# Patient Record
Sex: Female | Born: 1975 | Race: Black or African American | Hispanic: No | Smoking: Current every day smoker
Health system: Southern US, Community
[De-identification: ages and names within clinical notes are randomized; demographics above are authoritative.]

## PROBLEM LIST (undated history)

## (undated) DIAGNOSIS — E7800 Pure hypercholesterolemia, unspecified: Secondary | ICD-10-CM

## (undated) DIAGNOSIS — IMO0002 Reserved for concepts with insufficient information to code with codable children: Secondary | ICD-10-CM

## (undated) DIAGNOSIS — F419 Anxiety disorder, unspecified: Secondary | ICD-10-CM

## (undated) DIAGNOSIS — S335XXA Sprain of ligaments of lumbar spine, initial encounter: Secondary | ICD-10-CM

## (undated) DIAGNOSIS — H0019 Chalazion unspecified eye, unspecified eyelid: Secondary | ICD-10-CM

## (undated) DIAGNOSIS — G8929 Other chronic pain: Secondary | ICD-10-CM

## (undated) DIAGNOSIS — E785 Hyperlipidemia, unspecified: Secondary | ICD-10-CM

## (undated) DIAGNOSIS — N6452 Nipple discharge: Secondary | ICD-10-CM

## (undated) DIAGNOSIS — E079 Disorder of thyroid, unspecified: Secondary | ICD-10-CM

## (undated) DIAGNOSIS — M549 Dorsalgia, unspecified: Secondary | ICD-10-CM

## (undated) DIAGNOSIS — I25810 Atherosclerosis of coronary artery bypass graft(s) without angina pectoris: Secondary | ICD-10-CM

## (undated) DIAGNOSIS — N8501 Benign endometrial hyperplasia: Secondary | ICD-10-CM

## (undated) DIAGNOSIS — L600 Ingrowing nail: Secondary | ICD-10-CM

## (undated) DIAGNOSIS — I10 Essential (primary) hypertension: Secondary | ICD-10-CM

## (undated) DIAGNOSIS — I214 Non-ST elevation (NSTEMI) myocardial infarction: Secondary | ICD-10-CM

## (undated) DIAGNOSIS — Z9114 Patient's other noncompliance with medication regimen: Secondary | ICD-10-CM

## (undated) DIAGNOSIS — E039 Hypothyroidism, unspecified: Secondary | ICD-10-CM

## (undated) HISTORY — DX: Ingrowing nail: L60.0

## (undated) HISTORY — DX: Essential (primary) hypertension: I10

## (undated) HISTORY — PX: OTHER SURGICAL HISTORY: SHX169

## (undated) HISTORY — DX: Sprain of ligaments of lumbar spine, initial encounter: S33.5XXA

## (undated) HISTORY — DX: Pure hypercholesterolemia, unspecified: E78.00

## (undated) HISTORY — DX: Chalazion unspecified eye, unspecified eyelid: H00.19

## (undated) HISTORY — DX: Disorder of thyroid, unspecified: E07.9

## (undated) HISTORY — DX: Reserved for concepts with insufficient information to code with codable children: IMO0002

## (undated) HISTORY — DX: Non-ST elevation (NSTEMI) myocardial infarction: I21.4

## (undated) HISTORY — DX: Benign endometrial hyperplasia: N85.01

## (undated) HISTORY — DX: Nipple discharge: N64.52

## (undated) HISTORY — DX: Atherosclerosis of coronary artery bypass graft(s) without angina pectoris: I25.810

## (undated) HISTORY — DX: Patient's other noncompliance with medication regimen: Z91.14

---

## 1998-06-06 ENCOUNTER — Emergency Department (HOSPITAL_COMMUNITY): Admission: EM | Admit: 1998-06-06 | Discharge: 1998-06-06 | Payer: Self-pay | Admitting: Emergency Medicine

## 1999-03-30 ENCOUNTER — Encounter: Admission: RE | Admit: 1999-03-30 | Discharge: 1999-03-30 | Payer: Self-pay | Admitting: Family Medicine

## 1999-08-18 ENCOUNTER — Encounter: Admission: RE | Admit: 1999-08-18 | Discharge: 1999-08-18 | Payer: Self-pay | Admitting: Family Medicine

## 1999-11-30 ENCOUNTER — Encounter: Admission: RE | Admit: 1999-11-30 | Discharge: 1999-11-30 | Payer: Self-pay | Admitting: Family Medicine

## 1999-12-14 ENCOUNTER — Encounter: Admission: RE | Admit: 1999-12-14 | Discharge: 1999-12-14 | Payer: Self-pay | Admitting: Family Medicine

## 2000-03-23 ENCOUNTER — Encounter: Payer: Self-pay | Admitting: Emergency Medicine

## 2000-03-23 ENCOUNTER — Emergency Department (HOSPITAL_COMMUNITY): Admission: EM | Admit: 2000-03-23 | Discharge: 2000-03-23 | Payer: Self-pay | Admitting: Emergency Medicine

## 2000-09-06 ENCOUNTER — Encounter: Admission: RE | Admit: 2000-09-06 | Discharge: 2000-09-06 | Payer: Self-pay | Admitting: Family Medicine

## 2000-09-21 ENCOUNTER — Encounter: Admission: RE | Admit: 2000-09-21 | Discharge: 2000-09-21 | Payer: Self-pay | Admitting: Family Medicine

## 2000-11-23 ENCOUNTER — Encounter: Admission: RE | Admit: 2000-11-23 | Discharge: 2000-11-23 | Payer: Self-pay | Admitting: Family Medicine

## 2000-12-20 ENCOUNTER — Encounter: Admission: RE | Admit: 2000-12-20 | Discharge: 2000-12-20 | Payer: Self-pay | Admitting: Family Medicine

## 2000-12-28 ENCOUNTER — Encounter: Admission: RE | Admit: 2000-12-28 | Discharge: 2000-12-28 | Payer: Self-pay | Admitting: Family Medicine

## 2001-05-02 ENCOUNTER — Encounter: Admission: RE | Admit: 2001-05-02 | Discharge: 2001-05-02 | Payer: Self-pay | Admitting: Family Medicine

## 2001-05-10 ENCOUNTER — Encounter: Admission: RE | Admit: 2001-05-10 | Discharge: 2001-05-10 | Payer: Self-pay | Admitting: Family Medicine

## 2001-08-18 ENCOUNTER — Emergency Department (HOSPITAL_COMMUNITY): Admission: EM | Admit: 2001-08-18 | Discharge: 2001-08-18 | Payer: Self-pay | Admitting: Emergency Medicine

## 2002-08-22 ENCOUNTER — Encounter: Admission: RE | Admit: 2002-08-22 | Discharge: 2002-08-22 | Payer: Self-pay | Admitting: Family Medicine

## 2002-09-26 ENCOUNTER — Encounter: Admission: RE | Admit: 2002-09-26 | Discharge: 2002-09-26 | Payer: Self-pay | Admitting: Family Medicine

## 2002-09-29 ENCOUNTER — Encounter: Payer: Self-pay | Admitting: Sports Medicine

## 2002-09-29 ENCOUNTER — Encounter: Admission: RE | Admit: 2002-09-29 | Discharge: 2002-09-29 | Payer: Self-pay | Admitting: Sports Medicine

## 2002-10-08 ENCOUNTER — Encounter: Admission: RE | Admit: 2002-10-08 | Discharge: 2002-10-08 | Payer: Self-pay | Admitting: Family Medicine

## 2002-10-10 ENCOUNTER — Encounter: Payer: Self-pay | Admitting: Sports Medicine

## 2002-10-10 ENCOUNTER — Encounter: Admission: RE | Admit: 2002-10-10 | Discharge: 2002-10-10 | Payer: Self-pay | Admitting: Sports Medicine

## 2002-10-13 ENCOUNTER — Encounter: Admission: RE | Admit: 2002-10-13 | Discharge: 2002-10-13 | Payer: Self-pay | Admitting: Family Medicine

## 2002-11-25 ENCOUNTER — Encounter: Admission: RE | Admit: 2002-11-25 | Discharge: 2002-11-25 | Payer: Self-pay | Admitting: Sports Medicine

## 2002-12-15 ENCOUNTER — Encounter: Admission: RE | Admit: 2002-12-15 | Discharge: 2002-12-15 | Payer: Self-pay | Admitting: Family Medicine

## 2002-12-15 ENCOUNTER — Encounter (INDEPENDENT_AMBULATORY_CARE_PROVIDER_SITE_OTHER): Payer: Self-pay | Admitting: Specialist

## 2002-12-15 ENCOUNTER — Other Ambulatory Visit: Admission: RE | Admit: 2002-12-15 | Discharge: 2002-12-15 | Payer: Self-pay | Admitting: *Deleted

## 2003-05-28 ENCOUNTER — Encounter: Admission: RE | Admit: 2003-05-28 | Discharge: 2003-05-28 | Payer: Self-pay | Admitting: Family Medicine

## 2003-12-22 ENCOUNTER — Encounter: Admission: RE | Admit: 2003-12-22 | Discharge: 2003-12-22 | Payer: Self-pay | Admitting: Family Medicine

## 2004-01-18 ENCOUNTER — Encounter (INDEPENDENT_AMBULATORY_CARE_PROVIDER_SITE_OTHER): Payer: Self-pay | Admitting: *Deleted

## 2004-01-18 ENCOUNTER — Encounter: Admission: RE | Admit: 2004-01-18 | Discharge: 2004-01-18 | Payer: Self-pay | Admitting: Family Medicine

## 2004-03-17 ENCOUNTER — Encounter: Admission: RE | Admit: 2004-03-17 | Discharge: 2004-03-17 | Payer: Self-pay | Admitting: Family Medicine

## 2004-04-07 ENCOUNTER — Encounter: Admission: RE | Admit: 2004-04-07 | Discharge: 2004-04-07 | Payer: Self-pay | Admitting: Family Medicine

## 2004-04-12 ENCOUNTER — Encounter: Admission: RE | Admit: 2004-04-12 | Discharge: 2004-04-12 | Payer: Self-pay | Admitting: Family Medicine

## 2004-08-05 ENCOUNTER — Emergency Department (HOSPITAL_COMMUNITY): Admission: EM | Admit: 2004-08-05 | Discharge: 2004-08-05 | Payer: Self-pay | Admitting: Emergency Medicine

## 2004-08-12 ENCOUNTER — Encounter: Admission: RE | Admit: 2004-08-12 | Discharge: 2004-08-12 | Payer: Self-pay | Admitting: Specialist

## 2004-09-30 ENCOUNTER — Ambulatory Visit: Payer: Self-pay | Admitting: Sports Medicine

## 2004-12-01 ENCOUNTER — Ambulatory Visit: Payer: Self-pay | Admitting: Family Medicine

## 2004-12-01 ENCOUNTER — Encounter (INDEPENDENT_AMBULATORY_CARE_PROVIDER_SITE_OTHER): Payer: Self-pay | Admitting: *Deleted

## 2005-01-10 ENCOUNTER — Ambulatory Visit: Payer: Self-pay | Admitting: Family Medicine

## 2005-01-11 ENCOUNTER — Ambulatory Visit: Payer: Self-pay | Admitting: Family Medicine

## 2005-01-20 ENCOUNTER — Ambulatory Visit: Payer: Self-pay | Admitting: Family Medicine

## 2005-02-08 ENCOUNTER — Ambulatory Visit: Payer: Self-pay | Admitting: Family Medicine

## 2005-02-21 ENCOUNTER — Encounter: Admission: RE | Admit: 2005-02-21 | Discharge: 2005-05-22 | Payer: Self-pay | Admitting: Family Medicine

## 2005-05-24 ENCOUNTER — Encounter: Admission: RE | Admit: 2005-05-24 | Discharge: 2005-08-22 | Payer: Self-pay | Admitting: Family Medicine

## 2005-06-09 ENCOUNTER — Encounter (INDEPENDENT_AMBULATORY_CARE_PROVIDER_SITE_OTHER): Payer: Self-pay | Admitting: *Deleted

## 2005-06-09 ENCOUNTER — Ambulatory Visit: Payer: Self-pay | Admitting: Family Medicine

## 2005-06-21 ENCOUNTER — Ambulatory Visit: Payer: Self-pay | Admitting: Family Medicine

## 2005-10-23 ENCOUNTER — Ambulatory Visit: Payer: Self-pay | Admitting: Family Medicine

## 2005-11-13 ENCOUNTER — Encounter (INDEPENDENT_AMBULATORY_CARE_PROVIDER_SITE_OTHER): Payer: Self-pay | Admitting: *Deleted

## 2005-11-13 LAB — CONVERTED CEMR LAB

## 2005-12-06 ENCOUNTER — Ambulatory Visit: Payer: Self-pay | Admitting: Family Medicine

## 2005-12-06 ENCOUNTER — Encounter (INDEPENDENT_AMBULATORY_CARE_PROVIDER_SITE_OTHER): Payer: Self-pay | Admitting: *Deleted

## 2006-05-07 ENCOUNTER — Emergency Department (HOSPITAL_COMMUNITY): Admission: EM | Admit: 2006-05-07 | Discharge: 2006-05-07 | Payer: Self-pay | Admitting: Family Medicine

## 2006-07-27 ENCOUNTER — Emergency Department (HOSPITAL_COMMUNITY): Admission: EM | Admit: 2006-07-27 | Discharge: 2006-07-27 | Payer: Self-pay | Admitting: Emergency Medicine

## 2006-08-16 ENCOUNTER — Ambulatory Visit: Payer: Self-pay | Admitting: Family Medicine

## 2006-08-31 ENCOUNTER — Ambulatory Visit (HOSPITAL_BASED_OUTPATIENT_CLINIC_OR_DEPARTMENT_OTHER): Admission: RE | Admit: 2006-08-31 | Discharge: 2006-08-31 | Payer: Self-pay | Admitting: Gynecology

## 2006-08-31 ENCOUNTER — Encounter (INDEPENDENT_AMBULATORY_CARE_PROVIDER_SITE_OTHER): Payer: Self-pay | Admitting: Specialist

## 2006-09-13 DIAGNOSIS — N8501 Benign endometrial hyperplasia: Secondary | ICD-10-CM

## 2006-09-13 HISTORY — DX: Benign endometrial hyperplasia: N85.01

## 2006-11-13 HISTORY — PX: OTHER SURGICAL HISTORY: SHX169

## 2006-11-13 LAB — CONVERTED CEMR LAB: Pap Smear: ABNORMAL

## 2007-01-10 DIAGNOSIS — E1165 Type 2 diabetes mellitus with hyperglycemia: Secondary | ICD-10-CM

## 2007-01-10 DIAGNOSIS — E282 Polycystic ovarian syndrome: Secondary | ICD-10-CM | POA: Insufficient documentation

## 2007-01-10 DIAGNOSIS — I10 Essential (primary) hypertension: Secondary | ICD-10-CM | POA: Insufficient documentation

## 2007-01-10 DIAGNOSIS — I152 Hypertension secondary to endocrine disorders: Secondary | ICD-10-CM | POA: Insufficient documentation

## 2007-01-10 DIAGNOSIS — E039 Hypothyroidism, unspecified: Secondary | ICD-10-CM

## 2007-01-10 DIAGNOSIS — E785 Hyperlipidemia, unspecified: Secondary | ICD-10-CM | POA: Insufficient documentation

## 2007-01-11 ENCOUNTER — Encounter (INDEPENDENT_AMBULATORY_CARE_PROVIDER_SITE_OTHER): Payer: Self-pay | Admitting: *Deleted

## 2007-05-02 ENCOUNTER — Ambulatory Visit: Payer: Self-pay | Admitting: Family Medicine

## 2007-05-02 ENCOUNTER — Telehealth: Payer: Self-pay | Admitting: *Deleted

## 2007-05-02 DIAGNOSIS — H01009 Unspecified blepharitis unspecified eye, unspecified eyelid: Secondary | ICD-10-CM | POA: Insufficient documentation

## 2007-06-03 ENCOUNTER — Telehealth: Payer: Self-pay | Admitting: Family Medicine

## 2007-08-14 DIAGNOSIS — IMO0002 Reserved for concepts with insufficient information to code with codable children: Secondary | ICD-10-CM

## 2007-08-14 HISTORY — DX: Reserved for concepts with insufficient information to code with codable children: IMO0002

## 2007-08-22 ENCOUNTER — Ambulatory Visit: Payer: Self-pay | Admitting: Internal Medicine

## 2007-08-22 ENCOUNTER — Telehealth: Payer: Self-pay | Admitting: *Deleted

## 2007-08-22 DIAGNOSIS — R109 Unspecified abdominal pain: Secondary | ICD-10-CM | POA: Insufficient documentation

## 2007-09-04 ENCOUNTER — Other Ambulatory Visit: Admission: RE | Admit: 2007-09-04 | Discharge: 2007-09-04 | Payer: Self-pay | Admitting: Gynecology

## 2007-10-01 ENCOUNTER — Ambulatory Visit: Payer: Self-pay | Admitting: Family Medicine

## 2007-10-01 DIAGNOSIS — H0019 Chalazion unspecified eye, unspecified eyelid: Secondary | ICD-10-CM | POA: Insufficient documentation

## 2007-10-01 HISTORY — DX: Chalazion unspecified eye, unspecified eyelid: H00.19

## 2007-10-02 DIAGNOSIS — I10 Essential (primary) hypertension: Secondary | ICD-10-CM | POA: Insufficient documentation

## 2008-03-30 ENCOUNTER — Emergency Department (HOSPITAL_COMMUNITY): Admission: EM | Admit: 2008-03-30 | Discharge: 2008-03-30 | Payer: Self-pay | Admitting: Emergency Medicine

## 2008-04-09 ENCOUNTER — Other Ambulatory Visit: Admission: RE | Admit: 2008-04-09 | Discharge: 2008-04-09 | Payer: Self-pay | Admitting: Gynecology

## 2008-09-04 ENCOUNTER — Ambulatory Visit: Payer: Self-pay | Admitting: Gynecology

## 2008-09-04 ENCOUNTER — Other Ambulatory Visit: Admission: RE | Admit: 2008-09-04 | Discharge: 2008-09-04 | Payer: Self-pay | Admitting: Gynecology

## 2008-09-04 ENCOUNTER — Encounter: Payer: Self-pay | Admitting: Gynecology

## 2008-10-15 ENCOUNTER — Telehealth (INDEPENDENT_AMBULATORY_CARE_PROVIDER_SITE_OTHER): Payer: Self-pay | Admitting: Family Medicine

## 2008-10-19 ENCOUNTER — Ambulatory Visit: Payer: Self-pay | Admitting: Family Medicine

## 2008-10-19 ENCOUNTER — Encounter: Payer: Self-pay | Admitting: Family Medicine

## 2008-10-19 ENCOUNTER — Telehealth: Payer: Self-pay | Admitting: *Deleted

## 2008-10-19 LAB — CONVERTED CEMR LAB
Chlamydia, DNA Probe: NEGATIVE
GC Probe Amp, Genital: NEGATIVE

## 2008-10-20 ENCOUNTER — Encounter (INDEPENDENT_AMBULATORY_CARE_PROVIDER_SITE_OTHER): Payer: Self-pay | Admitting: Family Medicine

## 2008-10-23 ENCOUNTER — Encounter (INDEPENDENT_AMBULATORY_CARE_PROVIDER_SITE_OTHER): Payer: Self-pay | Admitting: Family Medicine

## 2008-11-23 ENCOUNTER — Ambulatory Visit: Payer: Self-pay | Admitting: Family Medicine

## 2009-01-21 ENCOUNTER — Telehealth (INDEPENDENT_AMBULATORY_CARE_PROVIDER_SITE_OTHER): Payer: Self-pay | Admitting: Family Medicine

## 2009-01-22 ENCOUNTER — Telehealth (INDEPENDENT_AMBULATORY_CARE_PROVIDER_SITE_OTHER): Payer: Self-pay | Admitting: *Deleted

## 2009-03-22 ENCOUNTER — Other Ambulatory Visit: Admission: RE | Admit: 2009-03-22 | Discharge: 2009-03-22 | Payer: Self-pay | Admitting: Gynecology

## 2009-03-22 ENCOUNTER — Ambulatory Visit: Payer: Self-pay | Admitting: Gynecology

## 2009-03-22 ENCOUNTER — Encounter: Payer: Self-pay | Admitting: Gynecology

## 2009-03-22 ENCOUNTER — Emergency Department (HOSPITAL_COMMUNITY): Admission: EM | Admit: 2009-03-22 | Discharge: 2009-03-22 | Payer: Self-pay | Admitting: Family Medicine

## 2009-03-25 ENCOUNTER — Telehealth: Payer: Self-pay | Admitting: *Deleted

## 2009-03-26 ENCOUNTER — Ambulatory Visit: Payer: Self-pay | Admitting: Family Medicine

## 2009-03-26 DIAGNOSIS — R109 Unspecified abdominal pain: Secondary | ICD-10-CM

## 2009-03-26 DIAGNOSIS — S335XXA Sprain of ligaments of lumbar spine, initial encounter: Secondary | ICD-10-CM | POA: Insufficient documentation

## 2009-03-26 HISTORY — DX: Unspecified abdominal pain: R10.9

## 2009-03-26 HISTORY — DX: Sprain of ligaments of lumbar spine, initial encounter: S33.5XXA

## 2009-03-26 LAB — CONVERTED CEMR LAB: Beta hcg, urine, semiquantitative: NEGATIVE

## 2009-07-15 ENCOUNTER — Telehealth: Payer: Self-pay | Admitting: Family Medicine

## 2009-07-16 ENCOUNTER — Ambulatory Visit: Payer: Self-pay | Admitting: Family Medicine

## 2009-10-04 ENCOUNTER — Ambulatory Visit: Payer: Self-pay | Admitting: Gynecology

## 2009-10-04 ENCOUNTER — Other Ambulatory Visit: Admission: RE | Admit: 2009-10-04 | Discharge: 2009-10-04 | Payer: Self-pay | Admitting: Gynecology

## 2010-09-18 ENCOUNTER — Emergency Department (HOSPITAL_BASED_OUTPATIENT_CLINIC_OR_DEPARTMENT_OTHER): Admission: EM | Admit: 2010-09-18 | Discharge: 2010-09-18 | Payer: Self-pay | Admitting: Emergency Medicine

## 2010-11-30 ENCOUNTER — Emergency Department (HOSPITAL_COMMUNITY)
Admission: EM | Admit: 2010-11-30 | Discharge: 2010-11-30 | Payer: Self-pay | Source: Home / Self Care | Admitting: Family Medicine

## 2011-01-10 ENCOUNTER — Encounter (INDEPENDENT_AMBULATORY_CARE_PROVIDER_SITE_OTHER): Payer: Managed Care, Other (non HMO) | Admitting: Gynecology

## 2011-01-10 DIAGNOSIS — Z01419 Encounter for gynecological examination (general) (routine) without abnormal findings: Secondary | ICD-10-CM

## 2011-01-10 DIAGNOSIS — R823 Hemoglobinuria: Secondary | ICD-10-CM

## 2011-01-11 ENCOUNTER — Other Ambulatory Visit (HOSPITAL_COMMUNITY)
Admission: RE | Admit: 2011-01-11 | Discharge: 2011-01-11 | Disposition: A | Discharge status: Home / Self Care | Payer: Managed Care, Other (non HMO) | Source: Ambulatory Visit | Attending: Gynecology | Admitting: Gynecology

## 2011-01-11 ENCOUNTER — Other Ambulatory Visit: Payer: Self-pay | Admitting: Gynecology

## 2011-01-11 ENCOUNTER — Encounter: Payer: Self-pay | Admitting: Gynecology

## 2011-01-11 DIAGNOSIS — Z124 Encounter for screening for malignant neoplasm of cervix: Secondary | ICD-10-CM | POA: Insufficient documentation

## 2011-02-02 ENCOUNTER — Ambulatory Visit (INDEPENDENT_AMBULATORY_CARE_PROVIDER_SITE_OTHER): Payer: Managed Care, Other (non HMO) | Admitting: Family Medicine

## 2011-02-02 ENCOUNTER — Encounter: Payer: Self-pay | Admitting: Family Medicine

## 2011-02-02 ENCOUNTER — Ambulatory Visit: Payer: Managed Care, Other (non HMO) | Admitting: Gynecology

## 2011-02-02 ENCOUNTER — Other Ambulatory Visit: Payer: Managed Care, Other (non HMO)

## 2011-02-02 VITALS — BP 135/95 | HR 92 | Temp 98.7°F | Ht 66.75 in | Wt 258.0 lb

## 2011-02-02 DIAGNOSIS — E039 Hypothyroidism, unspecified: Secondary | ICD-10-CM

## 2011-02-02 DIAGNOSIS — J3489 Other specified disorders of nose and nasal sinuses: Secondary | ICD-10-CM

## 2011-02-02 DIAGNOSIS — F17200 Nicotine dependence, unspecified, uncomplicated: Secondary | ICD-10-CM

## 2011-02-02 DIAGNOSIS — E119 Type 2 diabetes mellitus without complications: Secondary | ICD-10-CM

## 2011-02-02 MED ORDER — FLUTICASONE FUROATE 27.5 MCG/SPRAY NA SUSP: NASAL | Status: DC

## 2011-02-02 NOTE — Patient Instructions (Addendum)
I think you may have allergies and may also have an overlying viral upper respiratory infection. For allergies:   -Try OTC Zyrtec, Allergra  -May also try nasal rinses or Nettipot.   -May also try steroid nasal spray.   For viral URI symptoms:  -Tylenol/ibuprofen as needed.   -Lots of fluids.  Congraulations on your engagement. I would encourage you also to stop smoking.

## 2011-02-02 NOTE — Assessment & Plan Note (Signed)
Managed by endocrinologist Dr. Talmage Nap. On Synthroid.

## 2011-02-02 NOTE — Assessment & Plan Note (Signed)
Trying to quit but still smoking about 1/2 ppd.  People around her who smoke make it difficult for her to quit. Counseled. She wants to quit. Says she and fiancee are trying quit together.

## 2011-02-02 NOTE — Assessment & Plan Note (Signed)
Managed by Dr. Talmage Nap endocrinologist. Stopped taking Januvia due to cost and now on Humalog 75/25.

## 2011-02-02 NOTE — Assessment & Plan Note (Signed)
May be due to (seasonal) allergies or mild viral URI.  Recommend OTC anti-histamines. Given Rx for nasal steroids to use if orals do not help symptoms after 1-2 weeks.  Recommended conservative management for possible viral URI. Tylenol/ibuprofen prn.   Asked to follow-up prn or within a month with PCP. Never met PCP. Just got insurance and so is trying to get back to having regular appointments again.

## 2011-02-02 NOTE — Progress Notes (Signed)
  Subjective:    Patient ID: Tanya Nolan, female    DOB: September 22, 1976, 35 y.o.   MRN: [id number]  HPI 1. Cold symptoms since 03/18. Congestion with mild headaches, body aches, chills, cough. Had bronchitis around same time last year.  Had to leave work early today because was feeling weak. No fevers, productive cough. Mucinex helps but Theraflu didn't.  Never had seasonal allergies or diagnosis of asthma.   Smoking 1/2 ppd.  Review of Systems Denies vomiting. Occasional nausea. Denies diarrhea. Tolerating diet.     Objective:   Physical Exam Gen: alert, awake, not uncomfortable appearing, does not sound congested or appear weak HEENT: TM may be slightly bulging bilaterally but good light reflex and non-erythematous; mild clear nasal discharge; no conjunctival injection or drainage; no tonsillar or cervical adenopathy; MMM; no sinus tenderness Lungs: CTAB, no ronchi/wheezes/rales       Assessment & Plan:

## 2011-02-06 ENCOUNTER — Other Ambulatory Visit: Payer: Managed Care, Other (non HMO)

## 2011-02-06 ENCOUNTER — Ambulatory Visit: Payer: Managed Care, Other (non HMO) | Admitting: Gynecology

## 2011-02-13 ENCOUNTER — Other Ambulatory Visit: Payer: Managed Care, Other (non HMO)

## 2011-02-13 ENCOUNTER — Ambulatory Visit (INDEPENDENT_AMBULATORY_CARE_PROVIDER_SITE_OTHER): Payer: Managed Care, Other (non HMO) | Admitting: Gynecology

## 2011-02-13 ENCOUNTER — Other Ambulatory Visit: Payer: Self-pay | Admitting: Gynecology

## 2011-02-13 DIAGNOSIS — N920 Excessive and frequent menstruation with regular cycle: Secondary | ICD-10-CM

## 2011-02-13 DIAGNOSIS — E282 Polycystic ovarian syndrome: Secondary | ICD-10-CM

## 2011-02-13 DIAGNOSIS — N7013 Chronic salpingitis and oophoritis: Secondary | ICD-10-CM

## 2011-02-13 DIAGNOSIS — N840 Polyp of corpus uteri: Secondary | ICD-10-CM

## 2011-02-13 DIAGNOSIS — D3910 Neoplasm of uncertain behavior of unspecified ovary: Secondary | ICD-10-CM

## 2011-03-31 NOTE — Op Note (Signed)
NAMELASHANE, WHELPLEY                  ACCOUNT NO.:  [id number]   MEDICAL RECORD NO.:  [id number]          PATIENT TYPE:  AMB   LOCATION:  [facility]                         FACILITY:  [facility]   PHYSICIAN:  Timothy P. Fontaine, M.D.DATE OF BIRTH:  Mar 20, 1976   DATE OF PROCEDURE:  08/31/2006  DATE OF DISCHARGE:                                 OPERATIVE REPORT   PREOPERATIVE DIAGNOSES:  Irregular menses, endometrial polyp.   POSTOPERATIVE DIAGNOSES:  Irregular menses, endometrial polyp.   PROCEDURE:  Hysteroscopic polypectomy, D&C.   SURGEON:  Timothy P. Fontaine, M.D.   ANESTHETIC:  General.   ESTIMATED BLOOD LOSS:  Minimal.   SORBITOL DISCREPANCY:  20-30 mL.   COMPLICATIONS:  None.   SPECIMEN:  1. Endometrial polyps.  2. Endometrial curetting.   FINDINGS:  EUA:  external B U S, vagina normal.  Cervix normal, uterus  normal size, midline and mobile.  Adnexa without masses.  Hysteroscopic: Two  areas finger-like polyps, fundal right and left endometrial cavity resected  at the base. Hysteroscopy otherwise adequate normal noting fundus, right and  left tubal ostia, anterior-posterior uterine surfaces, lower uterine  segment, endocervical canal all visualized.   PROCEDURE:  The patient was taken to the operating room having received  antibiotic prophylaxsis, underwent general anesthesia, was placed low dorsal  lithotomy position, received a perineal vaginal preparation with Betadine  solution.  Bladder emptied with in-and-out Foley catheterization.  EUA  performed.  The patient draped in usual fashion.  The cervix was visualized  with a speculum, anterior lip grasped with single-tooth tenaculum and the  cervix was gently gradually dilated to admit the operative hysteroscope.  Hysteroscopy was performed with findings noted above.  Using the right angle  resectoscopic loop, the right and left fundal polyps were resected at their  base at the level of the surrounding endometrium.  There  were removed  separately sent to pathology.  Subsequently a sharp curettage was performed  and this was sent as a separate specimen to pathology.  Rehysteroscopy  showed an empty cavity, good distension, no evidence of perforation.  The  tenaculum was removed.  There was oozing due to a slight  tear in the cervical mucosa from the tenaculum and 3-0 chromic in a short  interrupted stitch was placed to achieve hemostasis.  The instruments were  all removed.  The patient placed in supine position, awakened without  difficulty and taken to recovery room in good condition having tolerated the  procedure well.      Timothy P. Fontaine, M.D.  Electronically Signed     TPF/MEDQ  D:  08/31/2006  T:  09/01/2006  Job:  [id number]

## 2011-03-31 NOTE — H&P (Signed)
NAMEANJANNETTE, Tanya Nolan                  ACCOUNT NO.:  [id number]   MEDICAL RECORD NO.:  [id number]          PATIENT TYPE:  AMB   LOCATION:  [facility]                         [facility]AC[facility]   PHYSICIAN:  Timothy P. Fontaine, M.D.DATE OF BIRTH:  Mar 28, 1976   DATE OF ADMISSION:  08/31/2006  DATE OF DISCHARGE:                                HISTORY & PHYSICAL   CHIEF COMPLAINT:  Menorrhagia, irregular bleeding.   HISTORY OF PRESENT ILLNESS:  [age]-year-old G0, initially presented  complaining of irregular menses. The patient's evaluation included normal  FSH prolactin and hemoglobin of 14.6.  She had a TSH which was elevated in  the 7 range. The patient also had a sonohysterogram which showed an  endometrial defect consistent with a polyp and a cystic area presumed to be  from her left ovary. She subsequently has been evaluated by Dr. Talmage Nap, has  begun on Synthroid replacement and her most recent TSH is 2.49. She is  admitted at this time for hysteroscopic evaluation and resection of the  endometrial defect. She has follow up planned as an outpatient in a month  for ultrasound to follow up on her ovarian cyst.   PAST MEDICAL HISTORY:  1. Significant for diabetes.  2. High blood pressure.  3. Hypercholesterolemia.  4. Hypothyroidism.   CURRENT MEDICATIONS:  1. Glucophage 500 b.i.d.  2. Zocor 20 q.h.s.  3. Enalapril daily.  4. Baby aspirin daily.  5. Synthroid daily, dose unknown at this time.   ALLERGIES:  No medication allergies.   REVIEW OF SYSTEMS:  Noncontributory.   FAMILY HISTORY:  Noncontributory.   SOCIAL HISTORY:  Noncontributory.   PHYSICAL EXAMINATION:  VITAL SIGNS:  Afebrile, vital signs stable. Blood  pressure 122/82.  HEENT:  Normal.  LUNGS:  Clear.  CARDIAC:  Regular rate, no rubs, murmurs or gallops.  ABDOMEN:  Exam benign, obese.  PELVIC:  External BUS, vagina normal. Cervix normal. Uterus grossly normal  in size, limited by abdominal girth. Adnexa  without gross masses or  tenderness.   ASSESSMENT:  [age]-year-old GO with irregular bleeding. Evaluation  reveals an endometrial polyp. Multiple medical issues to include type 2  diabetes, PCOS, hyperlipidemia, hypertension and hypothyroidism. The patient  is being actively followed by endocrinology and internal medicine. She is  controlled on her Synthroid with recent TSH of 2.49. She saw Dr. Karn Pickler who  gave her medical surgical clearance for her surgery and she is admitted at  this time for hysteroscopy, D&C, resection of her endometrial polyp. The  expected intraoperative/postoperative courses were reviewed with her as well  as the acute intraoperative/postoperative risks. I reviewed hysteroscopy,  D&C, use of the resectoscope with her and the risks of bleeding,  transfusion, infection, uterine perforation leading to damage to internal  organs including bowel, bladder, ureters, vessels, and nerves and assessed  any major exploratory repair of surgeries in the future. Imperative  surgeries including ostomy formation, was all discussed, understood and  accepted. The patient's questions were answered to her satisfaction. She is  ready to proceed with surgery.      Timothy P. Fontaine,  M.D.  Electronically Signed     TPF/MEDQ  D:  08/29/2006  T:  08/29/2006  Job:  [id number]

## 2011-09-12 DIAGNOSIS — IMO0002 Reserved for concepts with insufficient information to code with codable children: Secondary | ICD-10-CM | POA: Insufficient documentation

## 2011-09-12 DIAGNOSIS — N8501 Benign endometrial hyperplasia: Secondary | ICD-10-CM | POA: Insufficient documentation

## 2011-09-13 ENCOUNTER — Encounter: Payer: Self-pay | Admitting: Gynecology

## 2011-09-13 ENCOUNTER — Ambulatory Visit (INDEPENDENT_AMBULATORY_CARE_PROVIDER_SITE_OTHER): Payer: Managed Care, Other (non HMO) | Admitting: Gynecology

## 2011-09-13 DIAGNOSIS — R102 Pelvic and perineal pain: Secondary | ICD-10-CM

## 2011-09-13 DIAGNOSIS — N7013 Chronic salpingitis and oophoritis: Secondary | ICD-10-CM

## 2011-09-13 DIAGNOSIS — N390 Urinary tract infection, site not specified: Secondary | ICD-10-CM

## 2011-09-13 DIAGNOSIS — B373 Candidiasis of vulva and vagina: Secondary | ICD-10-CM

## 2011-09-13 DIAGNOSIS — N7011 Chronic salpingitis: Secondary | ICD-10-CM

## 2011-09-13 DIAGNOSIS — N898 Other specified noninflammatory disorders of vagina: Secondary | ICD-10-CM

## 2011-09-13 DIAGNOSIS — R82998 Other abnormal findings in urine: Secondary | ICD-10-CM

## 2011-09-13 DIAGNOSIS — N949 Unspecified condition associated with female genital organs and menstrual cycle: Secondary | ICD-10-CM

## 2011-09-13 MED ORDER — FLUCONAZOLE 150 MG PO TABS: 150.0000 mg | ORAL_TABLET | Freq: Once | ORAL | Status: AC

## 2011-09-13 MED ORDER — CIPROFLOXACIN HCL 250 MG PO TABS: 250.0000 mg | ORAL_TABLET | Freq: Two times a day (BID) | ORAL | Status: AC

## 2011-09-13 NOTE — Progress Notes (Addendum)
Patient presents with a one-week history of pain with intercourse noting pelvic aching and discomfort. No frequency dysuria or urgency symptoms constipation diarrhea fevers or chills. Having regular monthly menses. Having a little bit of discharge with some itching. She does have a history of probable hydrosalpinx as well as a probable small endometrial polyp from sonohysterogram April 2012. She was to follow up in several cycles for recheck and hasn't done that yet.  Exam Abdomen soft nontender without masses guarding rebound organomegaly Pelvic external BUS vagina normal slight discharge noted wet prep done cervix normal bimanual mild pelvic tenderness uterus grossly normal size midline mobile adnexa without gross masses or tenderness  Assessment and plan: 1. White discharge KOH wet prep is positive for yeast we'll treat with Diflucan 150x1 dose follow up if symptoms persist or recur. 2. Pelvic pain with dyspareunia. Will check ultrasound and follow up of her hydrosalpinx. Her UA is suspicious for UTI and recover her with Cipro 250 twice a day x3 days. 3. History of endometrial polyp. Patient will follow up for her ultrasound per above we'll do sonohysterogram relook at this area. She continues to have heavy menses as previously discussed earlier this year. 4. Contraception. I again discussed with her she is not using contraception she is at risk for pregnancy she understands this accepts.

## 2011-10-04 ENCOUNTER — Encounter: Payer: Self-pay | Admitting: Gynecology

## 2011-10-04 ENCOUNTER — Ambulatory Visit (INDEPENDENT_AMBULATORY_CARE_PROVIDER_SITE_OTHER): Payer: Managed Care, Other (non HMO)

## 2011-10-04 ENCOUNTER — Other Ambulatory Visit: Payer: Self-pay | Admitting: Gynecology

## 2011-10-04 ENCOUNTER — Ambulatory Visit (INDEPENDENT_AMBULATORY_CARE_PROVIDER_SITE_OTHER): Payer: Managed Care, Other (non HMO) | Admitting: Gynecology

## 2011-10-04 DIAGNOSIS — N840 Polyp of corpus uteri: Secondary | ICD-10-CM

## 2011-10-04 DIAGNOSIS — N7013 Chronic salpingitis and oophoritis: Secondary | ICD-10-CM

## 2011-10-04 DIAGNOSIS — N946 Dysmenorrhea, unspecified: Secondary | ICD-10-CM

## 2011-10-04 DIAGNOSIS — N920 Excessive and frequent menstruation with regular cycle: Secondary | ICD-10-CM

## 2011-10-04 DIAGNOSIS — N7011 Chronic salpingitis: Secondary | ICD-10-CM

## 2011-10-04 DIAGNOSIS — N831 Corpus luteum cyst of ovary, unspecified side: Secondary | ICD-10-CM

## 2011-10-04 DIAGNOSIS — N949 Unspecified condition associated with female genital organs and menstrual cycle: Secondary | ICD-10-CM

## 2011-10-04 DIAGNOSIS — R102 Pelvic and perineal pain: Secondary | ICD-10-CM

## 2011-10-04 DIAGNOSIS — IMO0002 Reserved for concepts with insufficient information to code with codable children: Secondary | ICD-10-CM

## 2011-10-04 NOTE — Progress Notes (Signed)
Patient presents for sonohysterogram due to history of pelvic pain, probable hydrosalpinx, small endometrial polyp on prior sonohysterogram in March 2012.  Ultrasound today shows endometrium at 1.6 mm with a tubular cystic mass in the left adnexa measuring 44 x 39 x 14 mm. Right and left ovaries with physiologic changes Sonohysterogram was performed sterile technique, EZ catheter introduction, good distention with evidence of upper right defect measuring 6 x 5 x 3 mm consistent with a small endometrial polyp.  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 In review of her scan she's had a smaller version dating back to 2007 which seems to have slightly gotten larger over time. Patient's pain has been over the last month, I am not sure whether this is related to this hydrosalpinx or not. She does have diarrhea but she is on metformin and not having any other GI symptoms. Also has a small area looks like an endometrial polyp. She has a history of endometrial polyps in the past with simple hyperplasia and I think given the persistence of the abnormality that we should proceed with hysteroscopy D&C to remove the endometrial polyp. The issue is whether we should proceed with laparoscopy to remove the hydrosalpinx rule out other pathology was discussed with her. I discussed in general what was involved with laparoscopy and the recovery associated with this. Patient wants to think about her options and she can follow up for the endometrial biopsy results beginning of next week and decide if she wants to proceed with laparoscopy along with hysteroscopy or hysteroscopy alone.

## 2011-10-12 ENCOUNTER — Emergency Department (HOSPITAL_COMMUNITY): Payer: Managed Care, Other (non HMO)

## 2011-10-12 ENCOUNTER — Emergency Department (INDEPENDENT_AMBULATORY_CARE_PROVIDER_SITE_OTHER): Payer: Managed Care, Other (non HMO)

## 2011-10-12 ENCOUNTER — Emergency Department (HOSPITAL_COMMUNITY)
Admission: EM | Admit: 2011-10-12 | Discharge: 2011-10-12 | Disposition: A | Discharge status: Home / Self Care | Payer: Managed Care, Other (non HMO) | Source: Home / Self Care | Attending: Emergency Medicine | Admitting: Emergency Medicine

## 2011-10-12 ENCOUNTER — Encounter (HOSPITAL_COMMUNITY): Payer: Self-pay | Admitting: *Deleted

## 2011-10-12 DIAGNOSIS — M928 Other specified juvenile osteochondrosis: Secondary | ICD-10-CM

## 2011-10-12 DIAGNOSIS — M7650 Patellar tendinitis, unspecified knee: Secondary | ICD-10-CM

## 2011-10-12 MED ORDER — MELOXICAM 15 MG PO TABS: 15.0000 mg | ORAL_TABLET | Freq: Every day | ORAL | Status: DC

## 2011-10-12 NOTE — ED Notes (Signed)
Pt  Reports  Pain l  Knee    denys  Any  specefic  Injury     She  denys    Any other  Known causative    Event  She  Does have  Several  Small  Knots  To the  Front of her l  [name]

## 2011-10-12 NOTE — ED Provider Notes (Signed)
History     CSN: [id number] Arrival date & time: 10/12/2011  7:03 PM   First MD Initiated Contact with Patient 10/12/11 1802      Chief Complaint  Patient presents with  . Knee Pain    (Consider location/radiation/quality/duration/timing/severity/associated sxs/prior treatment) HPI Comments: Catheryn has had a three-day history of left knee pain, aching, swelling, and stiffness. The pain is localized over the patellar tendon with radiation down into the foot. She denies any injury. She's had a history of knee problems since she was a teenager. Her knee sometimes locks or feel unstable but never completely gives way. She has had some popping and cracking in both knees. She denies any other joint pains, history of gout, fever, chills, or sweats.  Patient is a 35 y.o. female presenting with knee pain.  Knee Pain    Past Medical History  Diagnosis Date  . 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 Diabetes mellitus   . Hypertension   . Hypercholesteremia   . Thyroid dysfunction     Past Surgical History  Procedure Date  . Disc decompression 2008  . Hysteroscopy 2007    ENDOMETRIAL POLYPS REMOVED    Family History  Problem Relation Age of Onset  . Lupus Mother   . Hypertension Mother   . Hypertension Sister     History  Substance Use Topics  . Smoking status: Current Everyday Smoker -- 0.5 packs/day  . Smokeless tobacco: Never Used  . Alcohol Use: Yes     occassional    OB History    Grav Para Term Preterm Abortions TAB SAB Ect Mult Living   0               Review of Systems  Musculoskeletal: Positive for arthralgias. Negative for myalgias, back pain, joint swelling and gait problem.  Skin: Negative for rash and wound.  Neurological: Negative for weakness and numbness.    Allergies  Review of patient's allergies indicates no  known allergies.  Home Medications   Current Outpatient Rx  Name Route Sig Dispense Refill  . AZELASTINE HCL 0.05 % OP SOLN Both Eyes Place 1 drop into both eyes 2 (two) times daily.      . ENALAPRIL MALEATE 5 MG PO TABS Oral Take 5 mg by mouth daily.      Marland Kitchen FLUTICASONE FUROATE 27.5 MCG/SPRAY NA SUSP  2 sprays each nostril once daily. 10 g 2  . IBUPROFEN 800 MG PO TABS Oral Take 800 mg by mouth every 8 (eight) hours. For 2 weeks for back pain     . INSULIN LISPRO PROT & LISPRO (75-25) 100 UNIT/ML [place] SUSP Subcutaneous Inject 30 Units into the skin 2 (two) times daily with a meal.      . LEVOTHYROXINE SODIUM 137 MCG PO TABS Oral Take 137 mcg by mouth daily.      . MELOXICAM 15 MG PO TABS Oral Take 1 tablet (15 mg total) by mouth daily. 15 tablet 0  . METFORMIN HCL 500 MG PO TABS Oral Take 500 mg by mouth daily with breakfast.      . OMEPRAZOLE MAGNESIUM 20 MG PO TBEC Oral Take 20 mg by mouth 2 (two) times daily. Take 1 tab 2x a day for 1 week     . PROMETHAZINE HCL 12.5 MG PO TABS Oral Take 12.5 mg by mouth  every 8 (eight) hours as needed. For nausea       BP 137/92  Pulse 90  Temp(Src) 99.4 F (37.4 C) (Oral)  Resp 18  SpO2 100%  LMP 09/23/2011  Physical Exam  Nursing note and vitals reviewed. Constitutional: She is oriented to person, place, and time. She appears well-developed and well-nourished. No distress.  Musculoskeletal: Normal range of motion. She exhibits tenderness. She exhibits no edema.       There is no swelling or joint effusion. She had pain to palpation over the patellar tendon. The knee had a full range of motion with pain on flexion. There was no crepitus. McMurray's sign was negative and Lachman's sign was negative. There was no pain on varus or valgus stress. Anterior drawer sign was negative.  Neurological: She is alert and oriented to person, place, and time. She has normal strength and normal reflexes. She displays no atrophy. No sensory deficit. She exhibits  normal muscle tone.  Skin: Skin is warm and dry. No rash noted. She is not diaphoretic.    ED Course  Procedures (including critical care time)  Labs Reviewed - No data to display Dg Knee 2 Views Left  10/12/2011  *RADIOLOGY REPORT*  Clinical Data: Left knee pain.  LEFT KNEE - 1-2 VIEW  Comparison: None.  Findings: Two views show no acute fracture, dislocation or joint effusion.  The lateral projection, there is fragmentation of the tibial tubercle which may relate to prior Osgood-Schlatter disease. The patient is somewhat old to have active Osgood-Schlatter. Recommend correlation with any focal tenderness over the tibial tubercle.  IMPRESSION: Fragmented tibial tubercle suggestive of Osgood-Schlatter disease. This may be old.  Original Report Authenticated By: Reola Calkins, M.D.     1. Patellar tendonitis   2. 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 Will treat with anti-inflammatories, knee immobilizer, and she was urged to followup with an orthopedist if no better in 2 weeks.        Roque Lias, MD 10/12/11 [phone]

## 2011-10-13 ENCOUNTER — Telehealth: Payer: Self-pay

## 2011-10-13 NOTE — Telephone Encounter (Signed)
I called patient to schedule surgery.  I disussed her insurance benefits with her. I explained that if she does her surgery before Jan 1 she has met her $1000 ded and most of her coinsurance. Would have $393.64 oop max to meet and then ins would be paying at 100%. I explained if she waits til Jan that her ded starts over. I think she understood this..she said she did.  She was still not ready to schedule. I told her to think about it and call me when she is ready. I explained only several dates left in Dec when we can schedule and the sooner she lets me know the better chance for getting it scheduled in Dec.  Pt to call me.

## 2011-10-18 ENCOUNTER — Telehealth: Payer: Self-pay

## 2011-10-18 MED ORDER — MISOPROSTOL 200 MCG PO TABS: ORAL_TABLET | ORAL | Status: AC

## 2011-10-18 NOTE — Telephone Encounter (Signed)
Okay just for hysteroscopy D&C if she prefers just to monitor the probable hydrosalpinx

## 2011-10-18 NOTE — Telephone Encounter (Signed)
Patient called to schedule surgery.  She said she has decided she only wants Hysteroscopy, D&C and does not want to do Lap Salpingectomy now.  I saw your note in her chart and this seems agreeable.  However, the order for surgery you sent me had Lap Salping., Hyst D&C on it.  I just want to be sure you are okay with her doing just Hysteroscopy, D&C.?

## 2011-10-18 NOTE — Telephone Encounter (Signed)
Patient informed that Dr. Velvet Bathe wants her to use Cytotec 200 mcg tab vaginally night before surgery. Rx was e-scribed to her pharmacy. ka

## 2011-10-18 NOTE — Telephone Encounter (Signed)
Patient advised. Only Hyst.,D&C scheduled. ka

## 2011-10-24 ENCOUNTER — Encounter (HOSPITAL_COMMUNITY): Payer: Self-pay

## 2011-10-30 NOTE — Patient Instructions (Addendum)
   Your procedure is scheduled on: Thursday, Dec 20th  Enter through the [employer] [facility] at: 11:30am Pick up the phone at the desk and dial [phone] and inform us of your arrival.  Please call this number if you have any problems the morning of surgery: [phone]  Remember: Do not eat food after midnight: Wednesday Do not drink clear liquids after: Wednesday (Per patient "will sleep in til 10am) Take these medicines the morning of surgery with a SIP OF WATER:  As per anesthesia instructions  Do not wear jewelry, make-up, or FINGER nail polish Do not wear lotions, powders, perfumes or deodorant. Do not shave 48 hours prior to surgery. Do not bring valuables to the hospital.  Patients discharged on the day of surgery will not be allowed to drive home.   Home with Husband Rashawn Rolon  cell [phone]  Remember to use your hibiclens as instructed.Please shower with 1/2 bottle the evening before your surgery and the other 1/2 bottle the morning of surgery.

## 2011-10-31 ENCOUNTER — Ambulatory Visit (INDEPENDENT_AMBULATORY_CARE_PROVIDER_SITE_OTHER): Payer: Managed Care, Other (non HMO) | Admitting: Gynecology

## 2011-10-31 ENCOUNTER — Encounter (HOSPITAL_COMMUNITY)
Admission: RE | Admit: 2011-10-31 | Discharge: 2011-10-31 | Disposition: A | Discharge status: Home / Self Care | Payer: Managed Care, Other (non HMO) | Source: Ambulatory Visit | Attending: Gynecology | Admitting: Gynecology

## 2011-10-31 ENCOUNTER — Encounter: Payer: Self-pay | Admitting: Gynecology

## 2011-10-31 ENCOUNTER — Other Ambulatory Visit: Payer: Self-pay

## 2011-10-31 ENCOUNTER — Encounter (HOSPITAL_COMMUNITY): Payer: Self-pay

## 2011-10-31 DIAGNOSIS — N920 Excessive and frequent menstruation with regular cycle: Secondary | ICD-10-CM

## 2011-10-31 DIAGNOSIS — N840 Polyp of corpus uteri: Secondary | ICD-10-CM

## 2011-10-31 HISTORY — DX: Other chronic pain: G89.29

## 2011-10-31 HISTORY — DX: Hypothyroidism, unspecified: E03.9

## 2011-10-31 HISTORY — DX: Dorsalgia, unspecified: M54.9

## 2011-10-31 HISTORY — DX: Hyperlipidemia, unspecified: E78.5

## 2011-10-31 LAB — COMPREHENSIVE METABOLIC PANEL
BUN: 7 mg/dL (ref 6–23)
CO2: 24 mEq/L (ref 19–32)
Chloride: 104 mEq/L (ref 96–112)
Creatinine, Ser: 0.65 mg/dL (ref 0.50–1.10)
GFR calc Af Amer: >90 mL/min (ref >90)
GFR calc non Af Amer: >90 mL/min (ref >90)
Glucose, Bld: 180 mg/dL — ABNORMAL HIGH (ref 70–99)
Total Bilirubin: 0.2 mg/dL — ABNORMAL LOW (ref 0.3–1.2)

## 2011-10-31 LAB — CBC
HCT: 38.2 % (ref 36.0–46.0)
Hemoglobin: 12.9 g/dL (ref 12.0–15.0)
MCV: 84.5 fL (ref 78.0–100.0)
RBC: 4.52 MIL/uL (ref 3.87–5.11)
RDW: 13.9 % (ref 11.5–15.5)
WBC: 10.6 10*3/uL — ABNORMAL HIGH (ref 4.0–10.5)

## 2011-10-31 NOTE — Patient Instructions (Addendum)
Followup for surgery as scheduled. 

## 2011-10-31 NOTE — Pre-Procedure Instructions (Signed)
Reviewed patient's history with Dr Cristela Blue, Patient to take meds as normal on the day before surgery (Wed 12/19).  On DOS Thursday 12/20 morning, patient to take Levothroid - with small sip of water and only 5 units of Humalog.  Will check blood sugar in SS on DOS.   Patient verbalized understanding.

## 2011-10-31 NOTE — Progress Notes (Signed)
Tanya Nolan 03/07/1976 [id number]  Patient presented preoperatively for her upcoming hysteroscopy D&C.  Chief complaint: Menorrhagia, persistent endometrial polyp  History of present illness: 35 y.o.  G0 with menorrhagia and persistent endometrial polyp. Patient had sonohysterogram March 2012 which showed a small polyp and hydrosalpinx. Her menorrhagia has continued and repeat sonohysterogram November 2012 showed persistence of a right anterior small polyp measuring 6 mm.  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 Options for laparoscopy and removal at the same time as her hysteroscopy was performed but the patient declined and is comfortable with continued expectant management as far as the probable hydrosalpinx is concerned.   Past medical history,surgical history, medications, allergies, family history and social history were all reviewed and documented in the EPIC chart. ROS:  Was performed and pertinent positives and negatives are included in the history of present illness.  Exam: General: well developed, well nourished female, no acute distress HEENT: normal  Lungs: clear to auscultation without wheezing, rales or rhonchi  Cardiac: regular rate without rubs, murmurs or gallops  Abdomen: soft, nontender without masses, guarding, rebound, organomegaly  Pelvic: external bus vagina: normal   Cervix: grossly normal  Uterus: normal size, midline and mobile, nontender  Adnexa: without masses or tenderness      Assessment/Plan:  35 y.o.  G0 with menorrhagia and persistent endometrial polyp. Patient had sonohysterogram March 2012 which showed a small polyp and hydrosalpinx. Her menorrhagia has continued and repeat sonohysterogram November 2012 showed  persistence of a right anterior small polyp measuring 6 mm.  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  I reviewed the proposed surgery with her to include the expected intraoperative and postoperative courses. I discussed hysteroscopy, use of the resectoscope and the D&C portion of the procedure. The acute risks to include infection, prolonged antibiotics, hemorrhage necessitating transfusion and the risks of transfusion to include transfusion reaction, hepatitis, HIV, mad cow disease and other unknown entities. The risk of distended media absorption leading to metabolic complications such as coma seizures was also reviewed. The risk of uterine perforation with damage to internal organs, either immediately recognized or delay recognized, necessitating major exploratory reparative surgeries and future reparative surgeries including bowel resection, bladder repair, ureteral damage repair and ostomy formation was all discussed understood and accepted. No guarantees as far as menorrhagia relief was made. She understands her periods may continue heavy, worsen or change following the procedure. She also has the suspected hydrosalpinx that we have been following for years and the options for laparoscopy now under the same anesthesia to remove this was offered but the patient declined. She understands that this may need to be addressed in the future surgically and accepts this. The patient's questions were answered to her satisfaction and she is ready to proceed with surgery.    Dara Lords MD, 9:52 AM 10/31/2011

## 2011-10-31 NOTE — H&P (Signed)
Tanya Nolan 05-27-1976 [id number]   History and Physical    Chief complaint: Menorrhagia, persistent endometrial polyp  History of present illness: 35 y.o.  G0 with menorrhagia and persistent endometrial polyp. Patient had sonohysterogram March 2012 which showed a small polyp and hydrosalpinx. Her menorrhagia has continued and repeat sonohysterogram November 2012 showed persistence of a right anterior small polyp measuring 6 mm.  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 Options for laparoscopy and removal at the same time as her hysteroscopy was performed but the patient declined and is comfortable with continued expectant management as far as the probable hydrosalpinx is concerned.   Past medical history,surgical history, medications, allergies, family history and social history were all reviewed and documented in the EPIC chart. ROS:  Was performed and pertinent positives and negatives are included in the history of present illness.  Exam: General: well developed, well nourished female, no acute distress HEENT: normal  Lungs: clear to auscultation without wheezing, rales or rhonchi  Cardiac: regular rate without rubs, murmurs or gallops  Abdomen: soft, nontender without masses, guarding, rebound, organomegaly  Pelvic: external bus vagina: normal   Cervix: grossly normal  Uterus: normal size, midline and mobile, nontender  Adnexa: without masses or tenderness      Assessment/Plan:  35 y.o.  G0 with menorrhagia and persistent endometrial polyp. Patient had sonohysterogram March 2012 which showed a small polyp and hydrosalpinx. Her menorrhagia has continued and repeat sonohysterogram November 2012 showed persistence of a right anterior small polyp  measuring 6 mm.  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  I reviewed the proposed surgery with her to include the expected intraoperative and postoperative courses. I discussed hysteroscopy, use of the resectoscope and the D&C portion of the procedure. The acute risks to include infection, prolonged antibiotics, hemorrhage necessitating transfusion and the risks of transfusion to include transfusion reaction, hepatitis, HIV, mad cow disease and other unknown entities. The risk of distended media absorption leading to metabolic complications such as coma seizures was also reviewed. The risk of uterine perforation with damage to internal organs, either immediately recognized or delay recognized, necessitating major exploratory reparative surgeries and future reparative surgeries including bowel resection, bladder repair, ureteral damage repair and ostomy formation was all discussed understood and accepted. No guarantees as far as menorrhagia relief was made. She understands her periods may continue heavy, worsen or change following the procedure. She also has the suspected hydrosalpinx that we have been following for years and the options for laparoscopy now under the same anesthesia to remove this was offered but the patient declined. She understands that this may need to be addressed in the future surgically and accepts this. The patient's questions were answered to her satisfaction and she is ready to proceed with surgery.     Dara Lords MD, 10:35 AM 10/31/2011

## 2011-11-01 MED ORDER — DEXTROSE 5 % IV SOLN
2.0000 g | INTRAVENOUS | Status: AC
  Administered 2011-11-02: 2 g via INTRAVENOUS
  Filled 2011-11-01: qty 2

## 2011-11-02 ENCOUNTER — Ambulatory Visit (HOSPITAL_COMMUNITY)
Admission: RE | Admit: 2011-11-02 | Discharge: 2011-11-02 | Disposition: A | Discharge status: Home / Self Care | Payer: Managed Care, Other (non HMO) | Source: Ambulatory Visit | Attending: Gynecology | Admitting: Gynecology

## 2011-11-02 ENCOUNTER — Ambulatory Visit (HOSPITAL_COMMUNITY): Payer: Managed Care, Other (non HMO) | Admitting: Anesthesiology

## 2011-11-02 ENCOUNTER — Encounter (HOSPITAL_COMMUNITY): Payer: Self-pay | Admitting: Anesthesiology

## 2011-11-02 ENCOUNTER — Encounter (HOSPITAL_COMMUNITY)
Admission: RE | Disposition: A | Discharge status: Home / Self Care | Payer: Self-pay | Source: Ambulatory Visit | Attending: Gynecology

## 2011-11-02 ENCOUNTER — Encounter (HOSPITAL_COMMUNITY): Payer: Self-pay | Admitting: Gynecology

## 2011-11-02 ENCOUNTER — Other Ambulatory Visit: Payer: Self-pay | Admitting: Gynecology

## 2011-11-02 DIAGNOSIS — N840 Polyp of corpus uteri: Secondary | ICD-10-CM

## 2011-11-02 DIAGNOSIS — Z9889 Other specified postprocedural states: Secondary | ICD-10-CM

## 2011-11-02 DIAGNOSIS — N920 Excessive and frequent menstruation with regular cycle: Secondary | ICD-10-CM | POA: Insufficient documentation

## 2011-11-02 LAB — GLUCOSE, CAPILLARY
Glucose-Capillary: 138 mg/dL — ABNORMAL HIGH (ref 70–99)
Glucose-Capillary: 134 mg/dL — ABNORMAL HIGH (ref 70–99)

## 2011-11-02 LAB — HCG, SERUM, QUALITATIVE: Preg, Serum: NEGATIVE

## 2011-11-02 SURGERY — DILATATION & CURETTAGE/HYSTEROSCOPY WITH RESECTOCOPE
Anesthesia: General | Site: Vagina | Wound class: Clean Contaminated

## 2011-11-02 SURGERY — Surgical Case
Anesthesia: *Unknown

## 2011-11-02 MED ORDER — KETOROLAC TROMETHAMINE 30 MG/ML IJ SOLN
INTRAMUSCULAR | Status: DC | PRN
  Administered 2011-11-02: 30 mg via INTRAVENOUS

## 2011-11-02 MED ORDER — MIDAZOLAM HCL 2 MG/2ML IJ SOLN
INTRAMUSCULAR | Status: AC
  Filled 2011-11-02: qty 2

## 2011-11-02 MED ORDER — DEXAMETHASONE SODIUM PHOSPHATE 10 MG/ML IJ SOLN
INTRAMUSCULAR | Status: AC
  Filled 2011-11-02: qty 1

## 2011-11-02 MED ORDER — LIDOCAINE HCL (CARDIAC) 20 MG/ML IV SOLN
INTRAVENOUS | Status: AC
  Filled 2011-11-02: qty 5

## 2011-11-02 MED ORDER — GLYCINE 1.5 % IR SOLN
Status: DC | PRN
  Administered 2011-11-02: 3000 mL

## 2011-11-02 MED ORDER — FENTANYL CITRATE 0.05 MG/ML IJ SOLN
INTRAMUSCULAR | Status: DC | PRN
  Administered 2011-11-02 (×4): 50 ug via INTRAVENOUS

## 2011-11-02 MED ORDER — DEXAMETHASONE SODIUM PHOSPHATE 10 MG/ML IJ SOLN
INTRAMUSCULAR | Status: DC | PRN
  Administered 2011-11-02: 10 mg via INTRAVENOUS

## 2011-11-02 MED ORDER — KETOROLAC TROMETHAMINE 30 MG/ML IJ SOLN: 15.0000 mg | Freq: Once | INTRAMUSCULAR | Status: DC | PRN

## 2011-11-02 MED ORDER — PROPOFOL 10 MG/ML IV EMUL
INTRAVENOUS | Status: AC
  Filled 2011-11-02: qty 20

## 2011-11-02 MED ORDER — FENTANYL CITRATE 0.05 MG/ML IJ SOLN: 25.0000 ug | INTRAMUSCULAR | Status: DC | PRN

## 2011-11-02 MED ORDER — OXYCODONE-ACETAMINOPHEN 5-300 MG PO TABS: 1.0000 | ORAL_TABLET | ORAL | Status: AC | PRN

## 2011-11-02 MED ORDER — KETOROLAC TROMETHAMINE 30 MG/ML IJ SOLN
INTRAMUSCULAR | Status: AC
  Filled 2011-11-02: qty 1

## 2011-11-02 MED ORDER — ONDANSETRON HCL 4 MG/2ML IJ SOLN
INTRAMUSCULAR | Status: AC
  Filled 2011-11-02: qty 2

## 2011-11-02 MED ORDER — LACTATED RINGERS IV SOLN
INTRAVENOUS | Status: DC
  Administered 2011-11-02 (×2): via INTRAVENOUS

## 2011-11-02 MED ORDER — FENTANYL CITRATE 0.05 MG/ML IJ SOLN
INTRAMUSCULAR | Status: AC
  Filled 2011-11-02: qty 2

## 2011-11-02 MED ORDER — PROPOFOL 10 MG/ML IV EMUL
INTRAVENOUS | Status: DC | PRN
  Administered 2011-11-02: 200 mg via INTRAVENOUS

## 2011-11-02 MED ORDER — MIDAZOLAM HCL 5 MG/5ML IJ SOLN
INTRAMUSCULAR | Status: DC | PRN
  Administered 2011-11-02: 2 mg via INTRAVENOUS

## 2011-11-02 MED ORDER — ONDANSETRON HCL 4 MG/2ML IJ SOLN
INTRAMUSCULAR | Status: DC | PRN
  Administered 2011-11-02: 4 mg via INTRAVENOUS

## 2011-11-02 MED ORDER — LIDOCAINE HCL (CARDIAC) 20 MG/ML IV SOLN
INTRAVENOUS | Status: DC | PRN
  Administered 2011-11-02: 40 mg via INTRAVENOUS

## 2011-11-02 MED ORDER — LIDOCAINE HCL 1 % IJ SOLN
INTRAMUSCULAR | Status: DC | PRN
  Administered 2011-11-02: 10 mL

## 2011-11-02 SURGICAL SUPPLY — 13 items
CANISTER SUCTION 2500CC (MISCELLANEOUS) ×3 IMPLANT
CATH ROBINSON RED A/P 16FR (CATHETERS) ×3 IMPLANT
CLOTH BEACON ORANGE TIMEOUT ST (SAFETY) ×3 IMPLANT
CONTAINER PREFILL 10% NBF 60ML (FORM) ×6 IMPLANT
ELECT REM PT RETURN 9FT ADLT (ELECTROSURGICAL)
ELECTRODE REM PT RTRN 9FT ADLT (ELECTROSURGICAL) IMPLANT
GLOVE BIO SURGEON STRL SZ7.5 (GLOVE) ×6 IMPLANT
GOWN PREVENTION PLUS LG XLONG (DISPOSABLE) ×3 IMPLANT
GOWN STRL REIN XL XLG (GOWN DISPOSABLE) ×3 IMPLANT
LOOP ANGLED CUTTING 22FR (CUTTING LOOP) ×3 IMPLANT
PACK HYSTEROSCOPY LF (CUSTOM PROCEDURE TRAY) ×3 IMPLANT
TOWEL OR 17X24 6PK STRL BLUE (TOWEL DISPOSABLE) ×6 IMPLANT
WATER STERILE IRR 1000ML POUR (IV SOLUTION) ×3 IMPLANT

## 2011-11-02 NOTE — Transfer of Care (Signed)
Immediate Anesthesia Transfer of Care Note  Patient: Tanya Nolan  Procedure(s) Performed:  DILATATION AND CURETTAGE /HYSTEROSCOPY  Patient Location: PACU  Anesthesia Type: General  Level of Consciousness: awake, alert  and oriented  Airway & Oxygen Therapy: Patient Spontanous Breathing and Patient connected to nasal cannula oxygen  Post-op Assessment: Report given to PACU RN and Post -op Vital signs reviewed and stable  Post vital signs: Reviewed and stable  Complications: No apparent anesthesia complications

## 2011-11-02 NOTE — Anesthesia Preprocedure Evaluation (Signed)
Anesthesia Evaluation  Patient identified by MRN, date of birth, ID band Patient awake    Reviewed: Allergy & Precautions, H&P , NPO status , Patient's Chart, lab work & pertinent test results, reviewed documented beta blocker date and time   History of Anesthesia Complications Negative for: history of anesthetic complications  Airway Mallampati: III TM Distance: >3 FB Neck ROM: full    Dental  (+) Teeth Intact   Pulmonary Current Smoker,  clear to auscultation  Pulmonary exam normal       Cardiovascular Exercise Tolerance: Good hypertension, On Medications regular Normal    Neuro/Psych Chronic back pain Negative Neurological ROS  Negative Psych ROS   GI/Hepatic negative GI ROS, Neg liver ROS,   Endo/Other  Diabetes mellitus-, Type 2, Oral Hypoglycemic Agents and Insulin DependentHypothyroidism Morbid obesity  Renal/GU negative Renal ROS  Genitourinary negative   Musculoskeletal   Abdominal   Peds  Hematology negative hematology ROS (+)   Anesthesia Other Findings   Reproductive/Obstetrics negative OB ROS                           Anesthesia Physical Anesthesia Plan  ASA: III  Anesthesia Plan: General LMA   Post-op Pain Management:    Induction:   Airway Management Planned:   Additional Equipment:   Intra-op Plan:   Post-operative Plan:   Informed Consent: I have reviewed the patients History and Physical, chart, labs and discussed the procedure including the risks, benefits and alternatives for the proposed anesthesia with the patient or authorized representative who has indicated his/her understanding and acceptance.   Dental Advisory Given  Plan Discussed with: CRNA and Surgeon  Anesthesia Plan Comments:         Anesthesia Quick Evaluation

## 2011-11-02 NOTE — Anesthesia Postprocedure Evaluation (Signed)
  Anesthesia Post-op Note  Patient: Tanya Nolan  Procedure(s) Performed:  DILATATION & CURETTAGE/HYSTEROSCOPY WITH RESECTOCOPE  Patient is awake and responsive. Pain and nausea are reasonably well controlled. Vital signs are stable and clinically acceptable. Oxygen saturation is clinically acceptable. There are no apparent anesthetic complications at this time. Patient is ready for discharge.

## 2011-11-02 NOTE — H&P (Signed)
  The patient was examined.  I reviewed the proposed surgery and consent form with the patient.  The dictated history and physical is current and accurate and all questions were answered. The patient is ready to proceed with surgery and has a realistic understanding and expectation for the outcome.   Dara Lords MD, 1:01 PM 11/02/2011

## 2011-11-02 NOTE — Op Note (Signed)
Tanya Nolan 07-Nov-1976 [id number]   Post Operative Note   Date of surgery:  11/02/2011  Pre Op Dx: menorrhagia, endometrial polyp  Post Op Dx:  same  Procedure:  Hysteroscopy D&C resection of endometrial polyps  Surgeon:  Dara Lords  Anesthesia:  General  Local Injection:  10 cc 1% lidocaine paracervical block  EBL:  Minimal  Distended media discrepancy: Less than 150 cc  Complications:  None  Specimen:  Endometrial curetting with polyps to pathology   Findings: EUA:  External BUS vagina normal. Cervix normal. Uterus normal size midline mobile. Adnexa without masses   Operative:  Hysteroscopic with 2 small thin polyps in right and left fundal angle medial to the ostia. Otherwise hysteroscopy was normal noting fundus, anterior posterior uterine surfaces, lower uterine segment, endocervical canal, right and left tubal ostia all visualized   Procedure:  Patient was taken to the operating room, underwent general anesthesia, placed in low dorsal lithotomy position, received a vaginal / perineal preparation with Betadine solution per nursing personnel and the bladder was emptied with an in and out for catheterization done in sterile technique. Patient was draped in usual fashion. EUA was performed and the cervix was visualized with a speculum, anterior lip grasped with a single-tooth tenaculum and a paracervical block was placed using 1% lidocaine total of 10 cc. The cervix was then dilated to admit the operative hysteroscope and hysteroscopy was performed with findings noted above. The small polyps were transected using the right angle resectoscopic loop with care taken to avoid the ostia and a sharp curettage was subsequently performed. All of the specimen was sent together to pathology. Rehysteroscopy showed an empty cavity, good distention, no evidence of perforation. Patient was placed in the supine position, awakened without difficulty, received intraoperative Toradol, was  taken to the recovery room in good condition having tolerated the procedure well.    Dara Lords MD, 2:05 PM 11/02/2011

## 2011-11-09 ENCOUNTER — Encounter: Payer: Self-pay | Admitting: Gynecology

## 2011-11-21 ENCOUNTER — Encounter: Payer: Self-pay | Admitting: Gynecology

## 2011-11-21 ENCOUNTER — Ambulatory Visit (INDEPENDENT_AMBULATORY_CARE_PROVIDER_SITE_OTHER): Payer: Managed Care, Other (non HMO) | Admitting: Gynecology

## 2011-11-21 DIAGNOSIS — Z9889 Other specified postprocedural states: Secondary | ICD-10-CM

## 2011-11-21 DIAGNOSIS — N840 Polyp of corpus uteri: Secondary | ICD-10-CM

## 2011-11-21 NOTE — Progress Notes (Signed)
Patient presents postoperative status post hysteroscopy D&C removal of endometrial polyp. She's done well postoperative without complaints. I reviewed findings of pathology which showed a benign endometrial polyp.  Exam with Sherri chaperone present Pelvic: External BUS vagina: Normal Cervix: Normal Uterus: Grossly normal size shape and contour nontender Adnexa: Without masses or tenderness  Assessment and plan: Postop status post hysteroscopy D&C doing well. Will keep menstrual calendar as long as menses are acceptable we'll follow up in several months when she is due for her annual exam she knows to schedule this. If irregular bleeding or other issues she'll represent sooner.

## 2011-11-21 NOTE — Patient Instructions (Signed)
Keep menstrual calendar. As long as periods are acceptable then monitor and follow up for your annual exam in several months. If any issues then call.

## 2012-01-08 ENCOUNTER — Telehealth: Payer: Self-pay | Admitting: *Deleted

## 2012-01-08 ENCOUNTER — Encounter: Payer: Self-pay | Admitting: Family Medicine

## 2012-01-08 NOTE — Telephone Encounter (Signed)
Called patient to talk about coming in for A1C and cholesterol check since she has not been here recently. Was disconnected so I will mail her a letter.Tanya Nolan, Tanya Nolan

## 2012-02-02 ENCOUNTER — Encounter: Payer: Self-pay | Admitting: Family Medicine

## 2012-02-02 ENCOUNTER — Ambulatory Visit (INDEPENDENT_AMBULATORY_CARE_PROVIDER_SITE_OTHER): Payer: Managed Care, Other (non HMO) | Admitting: Family Medicine

## 2012-02-02 VITALS — BP 136/93 | HR 88 | Temp 99.2°F | Ht 67.0 in | Wt 266.0 lb

## 2012-02-02 DIAGNOSIS — K625 Hemorrhage of anus and rectum: Secondary | ICD-10-CM

## 2012-02-02 DIAGNOSIS — K649 Unspecified hemorrhoids: Secondary | ICD-10-CM

## 2012-02-02 LAB — POCT HEMOGLOBIN: Hemoglobin: 12.6 g/dL (ref 12.2–16.2)

## 2012-02-02 NOTE — Patient Instructions (Signed)
Increase fruit and vegetable intake to ultimate goal of 5-9 fruits per day. I suggest a fiber supplement- like fibercon or metamucil daily miralax 1 capful per day- if any signs of constipation use 2 x day.  Drink at least 8 glasses of water/fluids per day.  Work on [employer] loss- make an appointment with Dr. Rivka Safer to discuss weight loss.    Hemorrhoids Hemorrhoids are enlarged (dilated) veins around the rectum. There are 2 types of hemorrhoids, and the type of hemorrhoid is determined by its location. Internal hemorrhoids occur in the veins just inside the rectum.They are usually not painful, but they may bleed.However, they may poke through to the outside and become irritated and painful. External hemorrhoids involve the veins outside the anus and can be felt as a painful swelling or hard lump near the anus.They are often itchy and may crack and bleed. Sometimes clots will form in the veins. This makes them swollen and painful. These are called thrombosed hemorrhoids. CAUSES Causes of hemorrhoids include:  Pregnancy. This increases the pressure in the hemorrhoidal veins.   Constipation.   Straining to have a bowel movement.   Obesity.   Heavy lifting or other activity that caused you to strain.  TREATMENT Most of the time hemorrhoids improve in 1 to 2 weeks. However, if symptoms do not seem to be getting better or if you have a lot of rectal bleeding, your caregiver may perform a procedure to help make the hemorrhoids get smaller or remove them completely.Possible treatments include:  Rubber band ligation. A rubber band is placed at the base of the hemorrhoid to cut off the circulation.   Sclerotherapy. A chemical is injected to shrink the hemorrhoid.   Infrared light therapy. Tools are used to burn the hemorrhoid.   Hemorrhoidectomy. This is surgical removal of the hemorrhoid.  HOME CARE INSTRUCTIONS   Increase fiber in your diet. Ask your caregiver about using fiber  supplements.   Drink enough water and fluids to keep your urine clear or pale yellow.   Exercise regularly.   Go to the bathroom when you have the urge to have a bowel movement. Do not wait.   Avoid straining to have bowel movements.   Keep the anal area dry and clean.   Only take over-the-counter or prescription medicines for pain, discomfort, or fever as directed by your caregiver.  If your hemorrhoids are thrombosed:  Take warm sitz baths for 20 to 30 minutes, 3 to 4 times per day.   If the hemorrhoids are very tender and swollen, place ice packs on the area as tolerated. Using ice packs between sitz baths may be helpful. Fill a plastic bag with ice. Place a towel between the bag of ice and your skin.   Medicated creams and suppositories may be used or applied as directed.   Do not use a donut-shaped pillow or sit on the toilet for long periods. This increases blood pooling and pain.  SEEK MEDICAL CARE IF:   You have increasing pain and swelling that is not controlled with your medicine.   You have uncontrolled bleeding.   You have difficulty or you are unable to have a bowel movement.   You have pain or inflammation outside the area of the hemorrhoids.   You have chills or an oral temperature above 102 F (38.9 C).  MAKE SURE YOU:   Understand these instructions.   Will watch your condition.   Will get help right away if you are not doing  well or get worse.  Document Released: 10/27/2000 Document Revised: 10/19/2011 Document Reviewed: 03/03/2008 [facility] Patient Information 2012 [place], [place].

## 2012-02-02 NOTE — Progress Notes (Signed)
  Subjective:    Patient ID: Tanya Nolan, female    DOB: [birth year], 36 y.o.   MRN: [id number]  HPI Rectal bleeding: Patient has had rectal bleeding off and on x1 year. Usually has one bowel movement per day. The stools are not hard but are formed. Positive left lower quadrant pain off and on especially at night time. Multiple times per day has sensation that she needs to have a bowel movement but does not have any bowel movement when she goes to the toilet. Does have some drops of blood we'll drop the toilet. This is her normal over the past year. Still has one bowel movement per day typically. No black stools. Usually will have bright red blood per rectum-- either drops of blood into the toilet or streaks of blood on the side of the stool. Has had problems with hemorrhoids since 2008. Came in today because it seemed that she was losing more blood in the stool yesterday and was concerned. Blood is still bright red in color. No nausea. No vomiting. Occasional loose stool. No pain with bowel movement. Does not use any stool softeners-feels she doesn't need to since normally her stools are not hard.  Pt states that TSH has been checked during past year by endocrinologist and is wnl currently.  I do not have copy of this result.   Review of Systems As per above.    Objective:   Physical Exam  Constitutional: She appears well-developed and well-nourished.  Cardiovascular: Normal rate.   Pulmonary/Chest: Effort normal. No respiratory distress. She has no wheezes.  Abdominal: Soft. She exhibits no distension. There is no tenderness. There is no rebound and no guarding.  Genitourinary: Rectal exam shows external hemorrhoid ( skin tags at 12 o clock and 6 o clock position). Rectal exam shows no fissure, no mass, no tenderness and anal tone normal. Internal hemorrhoid: at 3oclock position- seen with use of anoscope- tissue friable.          Assessment & Plan:

## 2012-02-02 NOTE — Assessment & Plan Note (Signed)
Pt placed on bowel regimen: Increase fruit and vegetable intake to ultimate goal of 5-9 fruits per day. I suggest a fiber supplement- like fibercon or metamucil daily miralax 1 capful per day- if any signs of constipation use 2 x day.  Drink at least 8 glasses of water/fluids per day.  Work on [employer] loss- make an appointment with Dr. Rivka Safer to discuss weight loss and follow up on this issue.  Discussed red flags for return Will check [facility] today to ensure no anemia present. If + anemia or pt symptoms persist may need to consider referral out to surgery for surgical intervention.

## 2012-02-05 ENCOUNTER — Ambulatory Visit: Payer: Managed Care, Other (non HMO) | Admitting: Family Medicine

## 2012-04-02 ENCOUNTER — Emergency Department (HOSPITAL_COMMUNITY): Payer: Managed Care, Other (non HMO)

## 2012-04-02 ENCOUNTER — Observation Stay (HOSPITAL_COMMUNITY)
Admission: EM | Admit: 2012-04-02 | Discharge: 2012-04-02 | Disposition: A | Discharge status: Home / Self Care | Payer: Managed Care, Other (non HMO) | Source: Ambulatory Visit | Attending: Family Medicine | Admitting: Family Medicine

## 2012-04-02 ENCOUNTER — Encounter (HOSPITAL_COMMUNITY): Payer: Self-pay | Admitting: Emergency Medicine

## 2012-04-02 DIAGNOSIS — E119 Type 2 diabetes mellitus without complications: Secondary | ICD-10-CM

## 2012-04-02 DIAGNOSIS — R0602 Shortness of breath: Secondary | ICD-10-CM | POA: Insufficient documentation

## 2012-04-02 DIAGNOSIS — E079 Disorder of thyroid, unspecified: Secondary | ICD-10-CM | POA: Insufficient documentation

## 2012-04-02 DIAGNOSIS — I10 Essential (primary) hypertension: Secondary | ICD-10-CM | POA: Insufficient documentation

## 2012-04-02 DIAGNOSIS — R079 Chest pain, unspecified: Secondary | ICD-10-CM

## 2012-04-02 DIAGNOSIS — E1165 Type 2 diabetes mellitus with hyperglycemia: Secondary | ICD-10-CM | POA: Diagnosis present

## 2012-04-02 DIAGNOSIS — R0789 Other chest pain: Principal | ICD-10-CM | POA: Diagnosis present

## 2012-04-02 DIAGNOSIS — E1169 Type 2 diabetes mellitus with other specified complication: Secondary | ICD-10-CM | POA: Diagnosis present

## 2012-04-02 DIAGNOSIS — I152 Hypertension secondary to endocrine disorders: Secondary | ICD-10-CM | POA: Diagnosis present

## 2012-04-02 DIAGNOSIS — E785 Hyperlipidemia, unspecified: Secondary | ICD-10-CM

## 2012-04-02 DIAGNOSIS — R110 Nausea: Secondary | ICD-10-CM | POA: Insufficient documentation

## 2012-04-02 HISTORY — DX: Anxiety disorder, unspecified: F41.9

## 2012-04-02 LAB — DIFFERENTIAL
Basophils Relative: 0 % (ref 0–1)
Lymphocytes Relative: 47 % — ABNORMAL HIGH (ref 12–46)
Monocytes Absolute: 0.6 10*3/uL (ref 0.1–1.0)
Monocytes Relative: 5 % (ref 3–12)
Neutro Abs: 5.5 10*3/uL (ref 1.7–7.7)
Neutrophils Relative %: 46 % (ref 43–77)

## 2012-04-02 LAB — CBC
HCT: 36.5 % (ref 36.0–46.0)
Hemoglobin: 12.5 g/dL (ref 12.0–15.0)
MCHC: 34.2 g/dL (ref 30.0–36.0)
RBC: 4.46 MIL/uL (ref 3.87–5.11)
WBC: 12 10*3/uL — ABNORMAL HIGH (ref 4.0–10.5)

## 2012-04-02 LAB — CARDIAC PANEL(CRET KIN+CKTOT+MB+TROPI)
CK, MB: 2.9 ng/mL (ref 0.3–4.0)
CK, MB: 2.9 ng/mL (ref 0.3–4.0)
Relative Index: 1.2 (ref 0.0–2.5)
Relative Index: 1.2 (ref 0.0–2.5)
Total CK: 319 U/L — ABNORMAL HIGH (ref 7–177)
Troponin I: <0.3 ng/mL (ref <0.30)
Troponin I: <0.3 ng/mL (ref <0.30)
Troponin I: <0.3 ng/mL (ref <0.30)

## 2012-04-02 LAB — GLUCOSE, CAPILLARY
Glucose-Capillary: 183 mg/dL — ABNORMAL HIGH (ref 70–99)
Glucose-Capillary: 215 mg/dL — ABNORMAL HIGH (ref 70–99)

## 2012-04-02 LAB — BASIC METABOLIC PANEL
BUN: 10 mg/dL (ref 6–23)
CO2: 24 mEq/L (ref 19–32)
Chloride: 103 mEq/L (ref 96–112)
GFR calc Af Amer: >90 mL/min (ref >90)
Potassium: 4 mEq/L (ref 3.5–5.1)

## 2012-04-02 LAB — POCT I-STAT TROPONIN I: Troponin i, poc: 0 ng/mL (ref 0.00–0.08)

## 2012-04-02 LAB — POCT PREGNANCY, URINE: Preg Test, Ur: NEGATIVE

## 2012-04-02 MED ORDER — INSULIN ASPART 100 UNIT/ML ~~LOC~~ SOLN
0.0000 [IU] | Freq: Three times a day (TID) | SUBCUTANEOUS | Status: DC
  Administered 2012-04-02 (×2): 3 [IU] via SUBCUTANEOUS
  Administered 2012-04-02: 5 [IU] via SUBCUTANEOUS

## 2012-04-02 MED ORDER — SODIUM CHLORIDE 0.9 % IJ SOLN
3.0000 mL | Freq: Two times a day (BID) | INTRAMUSCULAR | Status: DC
  Administered 2012-04-02: 3 mL via INTRAVENOUS

## 2012-04-02 MED ORDER — ONDANSETRON HCL 4 MG/2ML IJ SOLN: 4.0000 mg | Freq: Once | INTRAMUSCULAR | Status: AC

## 2012-04-02 MED ORDER — HEPARIN SODIUM (PORCINE) 5000 UNIT/ML IJ SOLN
5000.0000 [IU] | Freq: Three times a day (TID) | INTRAMUSCULAR | Status: DC
  Administered 2012-04-02: 5000 [IU] via SUBCUTANEOUS
  Filled 2012-04-02 (×4): qty 1

## 2012-04-02 MED ORDER — MORPHINE SULFATE 4 MG/ML IJ SOLN
4.0000 mg | Freq: Once | INTRAMUSCULAR | Status: AC
  Administered 2012-04-02: 4 mg via INTRAVENOUS
  Filled 2012-04-02: qty 1

## 2012-04-02 MED ORDER — ACETAMINOPHEN 650 MG RE SUPP: 650.0000 mg | Freq: Four times a day (QID) | RECTAL | Status: DC | PRN

## 2012-04-02 MED ORDER — ACETAMINOPHEN 325 MG PO TABS
650.0000 mg | ORAL_TABLET | Freq: Four times a day (QID) | ORAL | Status: DC | PRN
  Administered 2012-04-02: 650 mg via ORAL
  Filled 2012-04-02: qty 2

## 2012-04-02 MED ORDER — ONDANSETRON HCL 4 MG/2ML IJ SOLN
4.0000 mg | Freq: Four times a day (QID) | INTRAMUSCULAR | Status: DC | PRN
  Administered 2012-04-02: 4 mg via INTRAVENOUS
  Filled 2012-04-02: qty 2

## 2012-04-02 MED ORDER — MORPHINE SULFATE 2 MG/ML IJ SOLN: 1.0000 mg | Freq: Once | INTRAMUSCULAR | Status: DC

## 2012-04-02 MED ORDER — POLYETHYLENE GLYCOL 3350 17 G PO PACK: 17.0000 g | PACK | Freq: Every day | ORAL | Status: DC | PRN

## 2012-04-02 MED ORDER — MORPHINE SULFATE 2 MG/ML IJ SOLN
1.0000 mg | INTRAMUSCULAR | Status: DC | PRN
  Administered 2012-04-02: 1 mg via INTRAVENOUS
  Filled 2012-04-02: qty 1

## 2012-04-02 MED ORDER — ASPIRIN 81 MG PO CHEW: 324.0000 mg | CHEWABLE_TABLET | Freq: Once | ORAL | Status: DC

## 2012-04-02 MED ORDER — ASPIRIN EC 81 MG PO TBEC
81.0000 mg | DELAYED_RELEASE_TABLET | Freq: Every day | ORAL | Status: DC
  Filled 2012-04-02: qty 1

## 2012-04-02 MED ORDER — IBUPROFEN 600 MG PO TABS
600.0000 mg | ORAL_TABLET | Freq: Four times a day (QID) | ORAL | Status: DC | PRN
  Filled 2012-04-02: qty 1

## 2012-04-02 MED ORDER — INSULIN ASPART 100 UNIT/ML ~~LOC~~ SOLN: 0.0000 [IU] | Freq: Every day | SUBCUTANEOUS | Status: DC

## 2012-04-02 MED ORDER — ASPIRIN 325 MG PO TABS
325.0000 mg | ORAL_TABLET | Freq: Every day | ORAL | Status: DC
  Administered 2012-04-02 (×2): 325 mg via ORAL
  Filled 2012-04-02 (×2): qty 1

## 2012-04-02 MED ORDER — LEVOTHYROXINE SODIUM 137 MCG PO TABS
137.0000 ug | ORAL_TABLET | Freq: Every day | ORAL | Status: DC
  Filled 2012-04-02 (×3): qty 1

## 2012-04-02 MED ORDER — GI COCKTAIL ~~LOC~~
30.0000 mL | Freq: Once | ORAL | Status: AC
  Administered 2012-04-02: 30 mL via ORAL
  Filled 2012-04-02: qty 30

## 2012-04-02 MED ORDER — ONDANSETRON HCL 4 MG PO TABS: 4.0000 mg | ORAL_TABLET | Freq: Four times a day (QID) | ORAL | Status: DC | PRN

## 2012-04-02 MED ORDER — ENALAPRIL MALEATE 10 MG PO TABS
10.0000 mg | ORAL_TABLET | Freq: Every day | ORAL | Status: DC
  Administered 2012-04-02: 10 mg via ORAL
  Filled 2012-04-02: qty 1

## 2012-04-02 NOTE — Progress Notes (Addendum)
Pt arrived to unit at 0520. Pt lethargic put arousable. Pt received total Morphine 5mg  within in emergency department. Pt unable to stay awake to watch pt safety video, will pass along to day shift nurse.  Pt orthostatics as follows, laying 155/99, 75, 95%RA; sitting 175/113, 78; standing 159/66, 92.  Will continue to monitor

## 2012-04-02 NOTE — Progress Notes (Signed)
Utilization Review Completed.Alanda Colton T5/21/2013   

## 2012-04-02 NOTE — H&P (Signed)
Family Medicine Teaching [facility] Admission History and Physical  Patient name: Tanya Nolan Medical record number: [id number] Date of birth: 1975-12-24 Age: 36 y.o. Gender: female  Primary Care Provider: Edd Arbour, MD, MD  Chief Complaint: Chest pain  History of Present Illness: Tanya Nolan is a 36 y.o. year old female presenting with 3 hours of persistent chest pain.  Chest pain started at midnight while pt was sitting at a table gambling w/ family members. Chest pain is located sternally to left chest and is described as ache. It is associated w/ lightheadedness, nausea, and diaphoresis but denies SOB or radiation to neck or arm. Also associted w/ bilateral arm numbness which is now resolved. Pt reports one previous episode that started while she was walking at work. This episode lasted for several hours and was not relieved w/ rest but slowly dissipated over time.   Review Of Systems: Per HPI with the following additions:  Positive: Migraines (recurrent problem for pt) Negative: diarrhea, constipation, palpitations, SOB  Patient Active Problem List  Diagnoses  . Unspecified Hypothyroidism  . DM w/o Complication Type II  . POLYCYSTIC OVARY  . HYPERLIPIDEMIA  . TOBACCO DEPENDENCE  . CHALAZION  . HYPERTENSION, BENIGN SYSTEMIC  . CERVICAL DYSPLASIA  . ABDOMINAL PAIN, LOWER  . BACK STRAIN, LUMBAR  . Congestion  . Simple endometrial hyperplasia  . LGSIL (low grade squamous intraepithelial dysplasia)  . Diabetes mellitus  . Hypertension  . Hypercholesteremia  . Thyroid dysfunction  . Hemorrhoids   Past Medical History: Past Medical History  Diagnosis Date  . 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 Diabetes mellitus   . Hypertension   . Hypercholesteremia   . Thyroid dysfunction   . Hypothyroidism   . Hyperlipidemia     no  meds - tx with diet  . Chronic back pain     tx with ibuprofen  . Anxiety     Past Surgical History: Past Surgical History  Procedure Date  . Disc decompression 2008  . Hysteroscopy x 2 2007 / 2012    ENDOMETRIAL POLYPS REMOVED  . Hysteroscopy 12.20.2012    w removal of endo polyp    Social History: History   Social History  . Marital Status: Married    Spouse Name: N/A    Number of Children: N/A  . Years of Education: N/A   Social History Main Topics  . Smoking status: Current Everyday Smoker -- 0.5 packs/day for 9 years    Types: Cigarettes  . Smokeless tobacco: Never Used  . Alcohol Use: Yes     socially - wine/liquor  . Drug Use: Yes    Special: Marijuana     past use "years ago" per patient  . Sexually Active: None   Other Topics Concern  . None   Social History Narrative  . None    Family History: Family History  Problem Relation Age of Onset  . Lupus Mother   . Hypertension Mother   . Hypertension Sister     Allergies: No Known Allergies  Current Facility-Administered Medications  Medication Dose Route Frequency Provider Last Rate Last Dose  . aspirin chewable tablet 324 mg  324 mg Oral Once Sunnie Nielsen, MD      . aspirin tablet 325 mg  325 mg Oral Daily Ardyth Gal, MD   325 mg at 04/02/12 0356  .  gi cocktail (Maalox,Lidocaine,Donnatal)  30 mL Oral Once Ardyth Gal, MD   30 mL at 04/02/12 0357  . morphine 2 MG/ML injection 1 mg  1 mg Intravenous Q2H PRN Ardyth Gal, MD   1 mg at 04/02/12 0400  . morphine 2 MG/ML injection 1 mg  1 mg Intravenous Once Ardyth Gal, MD      . morphine 4 MG/ML injection 4 mg  4 mg Intravenous Once Sunnie Nielsen, MD      . ondansetron [facility]) tablet 4 mg  4 mg Oral Q6H PRN Ardyth Gal, MD       Or  . ondansetron [facility]) injection 4 mg  4 mg Intravenous Q6H PRN Ardyth Gal, MD   4 mg at 04/02/12 0357  . ondansetron (ZOFRAN) injection 4 mg  4 mg Intravenous Once Sunnie Nielsen, MD        Current Outpatient Prescriptions  Medication Sig Dispense Refill  . Aspirin-Acetaminophen-Caffeine (GOODYS EXTRA STRENGTH PO) Take 1 Package by mouth 2 (two) times daily as needed. For pain      . enalapril (VASOTEC) 10 MG tablet Take 10 mg by mouth daily.        Marland Kitchen ibuprofen (ADVIL,MOTRIN) 800 MG tablet Take 800 mg by mouth every 8 (eight) hours as needed. For pain      . insulin lispro protamine-insulin lispro (HUMALOG 75/25) (75-25) 100 UNIT/ML SUSP Inject 40-50 Units into the skin 2 (two) times daily with a meal. Patient takes 40 units in the morning and 50 units at night.  Verified it is Humalog 75/25.      Marland Kitchen levothyroxine (LEVOTHROID) 137 MCG tablet Take 137 mcg by mouth daily.        . metFORMIN (GLUCOPHAGE) 500 MG tablet Take 500 mg by mouth daily before lunch.          Physical Exam: Filed Vitals:   04/02/12 0213  BP: 183/109  Temp: 98.3 F (36.8 C)  Resp: 20   General: WNWD, no distress HEENT: mmm Heart: RRR, no m/r/g Lungs: CTAB Abdomen: NABS, non-painful to palpation Extremities: 2+ distal pulses Musculoskeletal: No pain on sternal palpation. ROM normal Skin: Acanthosis Nigricans   Labs and Imaging: Results for orders placed during the hospital encounter of 04/02/12 (from the past 24 hour(s))  CBC     Status: Abnormal   Collection Time   04/02/12  2:19 AM      Component Value Range   WBC 12.0 (*) 4.0 - 10.5 (K/uL)   RBC 4.46  3.87 - 5.11 (MIL/uL)   Hemoglobin 12.5  12.0 - 15.0 (g/dL)   HCT 62.1  30.8 - 65.7 (%)   MCV 81.8  78.0 - 100.0 (fL)   MCH 28.0  26.0 - 34.0 (pg)   MCHC 34.2  30.0 - 36.0 (g/dL)   RDW 84.6  96.2 - 95.2 (%)   Platelets 336  150 - 400 (K/uL)  DIFFERENTIAL     Status: Abnormal   Collection Time   04/02/12  2:19 AM      Component Value Range   Neutrophils Relative 46  43 - 77 (%)   Neutro Abs 5.5  1.7 - 7.7 (K/uL)   Lymphocytes Relative 47 (*) 12 - 46 (%)   Lymphs Abs 5.7 (*) 0.7 - 4.0 (K/uL)   Monocytes Relative 5  3 - 12 (%)    Monocytes Absolute 0.6  0.1 - 1.0 (K/uL)   Eosinophils Relative 2  0 - 5 (%)   Eosinophils Absolute 0.2  0.0 -  0.7 (K/uL)   Basophils Relative 0  0 - 1 (%)   Basophils Absolute 0.0  0.0 - 0.1 (K/uL)  BASIC METABOLIC PANEL     Status: Abnormal   Collection Time   04/02/12  2:19 AM      Component Value Range   Sodium 138  135 - 145 (mEq/L)   Potassium 4.0  3.5 - 5.1 (mEq/L)   Chloride 103  96 - 112 (mEq/L)   CO2 24  19 - 32 (mEq/L)   Glucose, Bld 238 (*) 70 - 99 (mg/dL)   BUN 10  6 - 23 (mg/dL)   Creatinine, Ser 1.61  0.50 - 1.10 (mg/dL)   Calcium 8.7  8.4 - 09.6 (mg/dL)   GFR calc non Af Amer >90  >90 (mL/min)   GFR calc Af Amer >90  >90 (mL/min)  POCT I-STAT TROPONIN I     Status: Normal   Collection Time   04/02/12  2:28 AM      Component Value Range   Troponin i, poc 0.00  0.00 - 0.08 (ng/mL)   Comment 3           CARDIAC PANEL(CRET KIN+CKTOT+MB+TROPI)     Status: Abnormal   Collection Time   04/02/12  3:36 AM      Component Value Range   Total CK 319 (*) 7 - 177 (U/L)   CK, MB 2.9  0.3 - 4.0 (ng/mL)   Troponin I <0.30  <0.30 (ng/mL)   Relative Index 0.9  0.0 - 2.5     CXR: Interstitial crowding is favored to be secondary to hypoaeration.  No radiographic evidence of acute cardiopulmonary process  EKG: Nml   Assessment and Plan: Tanya Nolan is a 36 y.o. year old female presenting with chest pain  1. Chest pain: musculoskeletal vs cardiac vs reflux. EKG unremarkable. Pt did not receive any therapies in ED so wil evaluate further after treatement - ASA, Morphine, GI cocktail - Repeat EKG at 0500 - Cardiac Enzymes x3  -Risk stratification labs (TSH, HgbA1c, Fasting Lipid)  2. DM: Poorly controlled. No signs or symptoms of hypo/hyperglycemia.  - Start SSI - hold Metformin  3. FEN/GI: Taking good po despite symptoms - SLIV  3. Prophylaxis: - Protonix - Heparin Sub Q TID  4. Disposition: Pending CP r/o  Signed: MERRELL, DAVID, M.D. Family Medicine Resident  PGY-1 [phone] 04/02/2012 4:03 AM   PGY2 Addendum to PGY1 H & P:   I was present and participated in formulating the above History, physical, and assessment and plan.  This is a 36 year old female with chest pain for four days, whose risk factors include DM II, obesity, HTN, HLD, and tobacco abuse. We will admit her to telemetry, cycle cardiac enzymes, and obtain labs to further risk stratify.    Zebedee Segundo] 04/02/2012 4:30 AM

## 2012-04-02 NOTE — ED Provider Notes (Signed)
History     CSN: [id number]  Arrival date & time 04/02/12  0205   First MD Initiated Contact with Patient 04/02/12 0215      Chief Complaint  Patient presents with  . Chest Pain    (Consider location/radiation/quality/duration/timing/severity/associated sxs/prior treatment) Patient is a 36 y.o. female presenting with chest pain.  Chest Pain Primary symptoms include shortness of breath. Pertinent negatives for primary symptoms include no fever and no abdominal pain.    History provided by patient. No chest pain 2 days ago while at work which resolved and then tonight developed chest pain again tonight. Location is substernal and not radiating. Pressure-like in quality. Has associated shortness of breath with nausea. No vomiting. She denies any reflux. No trauma. Not worse with breathing or movement. No associated leg pain or swelling. Fevers or chills. No cough or recent illness. No history of same. Has history of diabetes, hypertension and hyperlipidemia followed by my practice at [facility]. No known alleviating factors.  Past Medical History  Diagnosis Date  . 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 Diabetes mellitus   . Hypertension   . Hypercholesteremia   . Thyroid dysfunction   . Hypothyroidism   . Hyperlipidemia     no meds - tx with diet  . Chronic back pain     tx with ibuprofen  . Anxiety     Past Surgical History  Procedure Date  . Disc decompression 2008  . Hysteroscopy x 2 2007 / 2012    ENDOMETRIAL POLYPS REMOVED  . Hysteroscopy 12.20.2012    w removal of endo polyp    Family History  Problem Relation Age of Onset  . Lupus Mother   . Hypertension Mother   . Hypertension Sister     History  Substance Use Topics  . Smoking status: Current Everyday Smoker -- 0.5 packs/day for 9 years    Types: Cigarettes  . Smokeless  tobacco: Never Used  . Alcohol Use: Yes     socially - wine/liquor    OB History    Grav Para Term Preterm Abortions TAB SAB Ect Mult Living   0               Review of Systems  Constitutional: Negative for fever and chills.  HENT: Negative for neck pain and neck stiffness.   Eyes: Negative for pain.  Respiratory: Positive for shortness of breath.   Cardiovascular: Positive for chest pain.  Gastrointestinal: Negative for abdominal pain.  Genitourinary: Negative for dysuria.  Musculoskeletal: Negative for back pain.  Skin: Negative for rash.  Neurological: Negative for headaches.  All other systems reviewed and are negative.    Allergies  Review of patient's allergies indicates no known allergies.  Home Medications   Current Outpatient Rx  Name Route Sig Dispense Refill  . GOODYS EXTRA STRENGTH PO Oral Take 1 Package by mouth 2 (two) times daily as needed. For pain    . ENALAPRIL MALEATE 10 MG PO TABS Oral Take 10 mg by mouth daily.      . IBUPROFEN 800 MG PO TABS Oral Take 800 mg by mouth every 8 (eight) hours as needed. For pain    . INSULIN LISPRO PROT & LISPRO (75-25) 100 UNIT/ML [place] SUSP Subcutaneous Inject 40-50 Units into the skin 2 (two) times daily with a meal. Patient takes 40 units  in the morning and 50 units at night.  Verified it is Humalog 75/25.    Marland Kitchen LEVOTHYROXINE SODIUM 137 MCG PO TABS Oral Take 137 mcg by mouth daily.      Marland Kitchen METFORMIN HCL 500 MG PO TABS Oral Take 500 mg by mouth daily before lunch.       BP 183/109  Temp(Src) 98.3 F (36.8 C) (Oral)  Resp 20  SpO2 98%  LMP 03/19/2012  Physical Exam  Constitutional: She is oriented to person, place, and time. She appears well-developed and well-nourished.  HENT:  Head: Normocephalic and atraumatic.  Eyes: Conjunctivae and EOM are normal. Pupils are equal, round, and reactive to light.  Neck: Trachea normal. Neck supple. No thyromegaly present.  Cardiovascular: Normal rate, regular rhythm, S1 normal,  S2 normal and normal pulses.     No systolic murmur is present   No diastolic murmur is present  Pulses:      Radial pulses are 2+ on the right side, and 2+ on the left side.  Pulmonary/Chest: Effort normal and breath sounds normal. She has no wheezes. She has no rhonchi. She has no rales. She exhibits no tenderness.  Abdominal: Soft. Normal appearance and bowel sounds are normal. There is no tenderness. There is no CVA tenderness and negative Murphy's sign.  Musculoskeletal:       BLE:s Calves nontender, no cords or erythema, negative Homans sign  Neurological: She is alert and oriented to person, place, and time. She has normal strength. No cranial nerve deficit or sensory deficit. GCS eye subscore is 4. GCS verbal subscore is 5. GCS motor subscore is 6.  Skin: Skin is warm and dry. No rash noted. She is not diaphoretic.  Psychiatric: Her speech is normal.       Cooperative and appropriate    ED Course  Procedures (including critical care time)  Labs Reviewed  CBC - Abnormal; Notable for the following:    WBC 12.0 (*)    All other components within normal limits  DIFFERENTIAL - Abnormal; Notable for the following:    Lymphocytes Relative 47 (*)    Lymphs Abs 5.7 (*)    All other components within normal limits  BASIC METABOLIC PANEL - Abnormal; Notable for the following:    Glucose, Bld 238 (*)    All other components within normal limits  POCT I-STAT TROPONIN I  CARDIAC PANEL(CRET KIN+CKTOT+MB+TROPI)   Dg Chest 2 View  04/02/2012  *RADIOLOGY REPORT*  Clinical Data: Left chest pain  CHEST - 2 VIEW  Comparison: None.  Findings: Hypoaeration results in elevated hemidiaphragms and interstitial and vascular crowding.  Within this limitation, no definite consolidation, pleural effusion, or pneumothorax. Cardiomediastinal contours within normal limits.  No acute osseous finding.  IMPRESSION: Interstitial crowding is favored to be secondary to hypoaeration. No radiographic evidence of  acute cardiopulmonary process.  Original Report Authenticated By: Waneta Martins, M.D.     1. Chest pain     Date: 04/02/2012  Rate: 87  Rhythm: normal sinus rhythm  QRS Axis: normal  Intervals: normal  ST/T Wave abnormalities: nonspecific ST changes  Conduction Disutrbances:none  Narrative Interpretation:   Old EKG Reviewed: unchanged  Aspirin. Stat EKG obtained and reviewed as above. IV morphine for pain. Labs and chest x-ray obtained and reviewed as above. Medicine consult for admit. Case discussed as above with family practice resident on call who agrees to admission.   MDM   Chest pain and 36 year old female with history of hypertension, hyperlipidemia and  diabetes. Concern for possible ACS. No DVT symptoms. Doubt PE. No back pain or tearing symptoms to suggest dissection. No pneumothorax or apparent pathology on chest x-ray.         Sunnie Nielsen, MD 04/02/12 0400

## 2012-04-02 NOTE — Discharge Summary (Signed)
Physician Discharge Summary  Patient ID: ALEILA SYVERSON MRN: [id number] DOB/AGE: February 07, 1976 36 y.o.  Admit date: 05/02/12 Discharge date: 05-02-12  PCP: Edd Arbour, MD  Admission Diagnoses: Atypical chest pain   Discharge Diagnoses:  Principal Problem:  *Atypical chest pain Active Problems:  DM w/o Complication Type II  HYPERLIPIDEMIA  HYPERTENSION, BENIGN SYSTEMIC  Thyroid dysfunction   Discharged Condition: good  Hospital Course:   Mrs. Mooty is a 36 year old female with obesity, DMII, HTN, hyperlipidemia, smoking, thyroid dysfunction and a family history of CAD in her mother in her 64's who presented with atypical chest pain. The pain occurred at rest while playing cards and was associated with lightheadedness, nausea and diaphoresis. It lasted several hours before she decided to come to the ED. She was treated with aspirin and given morphine and a GI cocktail. This reduced the pain to 4/10 from and 8/10. The initial concern was ACS. Thankfully, her cardiac enzymes were negative and EKG showed normal sinus rhythm and poor R wave progression noted on previous EKG's. Her telemetry was non-revealing. The likelihood of pulmonary embolism was low, because she had no swelling or tenderness of either calf. She was not dyspneic, tachypneic, tachycardic or hypoxemic. Additionally, she had no history of DVT or prolonged stasis. Other possible causes include musculoskeletal pain, which is likely since it was relieved by acetaminophen. She was pain free and stable for discharge in less than 24 hours. She has many risk factors for CAD and should have close attention paid to those in the future.   Consults: None  Significant Diagnostic Studies:   Cardiac Enzymes:  Lab 2012/05/02 1535 05/02/12 0859 May 02, 2012 0336  CKTOTAL 245* 246* 319*  CKMB 2.9 2.9 2.9  CKMBINDEX -- -- --  TROPONINI <0.30 <0.30 <0.30   EKG: Normal Sinus Rhythm   Chest X-ray: Interstitial crowding is favored to be  secondary to hypoaeration. No radiographic evidence of acute cardiopulmonary process.    Discharge Exam: Blood pressure 139/91, pulse 62, temperature 98 F (36.7 C), temperature source Oral, resp. rate 18, height 5\' 6"  (1.676 m), weight 261 lb 14.5 oz (118.8 kg), last menstrual period 03/19/2012, SpO2 100.00%. General: young AAF, obese, no distress  HEENT: NCAT, PERRLA, OP clear and moist Heart: RRR, no m/r/g  Lungs: CTAB  Abdomen: NABS, non-painful to palpation  Extremities: 2+ distal pulses  Musculoskeletal: No pain on sternal palpation. ROM normal  Skin: Acanthosis Nigricans   Disposition: 01-Home or Self Care  Discharge Orders    Future Orders Please Complete By Expires   Diet - low sodium heart healthy      Increase activity slowly        Medication List  As of 2012/05/02  5:25 PM   TAKE these medications         enalapril 10 MG tablet   Commonly known as: VASOTEC   Take 10 mg by mouth daily.      GOODYS EXTRA STRENGTH PO   Take 1 Package by mouth 2 (two) times daily as needed. For pain      ibuprofen 800 MG tablet   Commonly known as: ADVIL,MOTRIN   Take 800 mg by mouth every 8 (eight) hours as needed. For pain      insulin lispro protamine-insulin lispro (75-25) 100 UNIT/ML Susp   Commonly known as: HUMALOG 75/25   Inject 40-50 Units into the skin 2 (two) times daily with a meal. Patient takes 40 units in the morning and 50 units at night.  Verified  it is Humalog 75/25.      LEVOTHROID 137 MCG tablet   Generic drug: levothyroxine   Take 137 mcg by mouth daily.      metFORMIN 500 MG tablet   Commonly known as: GLUCOPHAGE   Take 500 mg by mouth daily before lunch.           Follow-up Information    Follow up with Edd Arbour, MD. Schedule an appointment as soon as possible for a visit in 3 days.   Contact information:   [address] [place] [address] [phone]         Follow Up Issues:   1. Needs following labs  drawn: lipid profile, Hgb A1C, TSH 2. Possible referral to cardiology  3. Smoking cessation   Signed: Mat Carne 04/02/2012, 5:25 PM

## 2012-04-02 NOTE — Consult Note (Signed)
Pt states she smokes 5 cigarettes /day. Currently she is interested in the nicotine gum. Recommended for pt to use the 2 mg nicotine gum . Discussed gum use instructions and how to use. Discussed avoiding acidic beverages within 30 minutes before and after using the gum. Pt verbalizes understanding. Referred to 1-800 quit now for f/u and support. Discussed oral fixation substitutes, second hand smoke and in home smoking policy. Reviewed and gave pt Written education/contact information.

## 2012-04-02 NOTE — Progress Notes (Signed)
Reviewed dc instructions with patient, verbalized understanding given copy of instructions

## 2012-04-02 NOTE — H&P (Signed)
Family Medicine Teaching Service Attending Note  I interviewed and examined patient Tanya Nolan and reviewed their tests and x-rays.  I discussed with Dr. Lula Olszewski and reviewed their note for today.  I agree with their assessment and plan.     Additionally  No chest pain now able to walk halls No signs of ACS Once rule out ok to discharge and follow up with optpt stress test

## 2012-04-02 NOTE — ED Notes (Signed)
Pt c/o chest pain that extends to arms and neck.  Pain started on Friday, no pain on Saturday and Sunday.  Pain started again 1 hour PTA.  Causes SOB, numbness in arms, with dizziness upon standing.  Denies n/v/d.  Pt alert and oriented with family at bedside.

## 2012-04-02 NOTE — Discharge Summary (Signed)
I have reviewed this discharge summary and agree.    

## 2012-04-02 NOTE — ED Notes (Signed)
PT. REPORTS LEFT / MID CHEST PAIN WITH SOB AND NAUSEA ONSET LAST Friday .

## 2012-04-02 NOTE — Discharge Instructions (Signed)
Chest Pain (Nonspecific) It is often hard to give a specific diagnosis for the cause of chest pain. There is always a chance that your pain could be related to something serious, such as a heart attack or a blood clot in the lungs. You need to follow up with your caregiver for further evaluation. CAUSES   Heartburn.   Pneumonia or bronchitis.   Anxiety or stress.   Inflammation around your heart (pericarditis) or lung (pleuritis or pleurisy).   A blood clot in the lung.   A collapsed lung (pneumothorax). It can develop suddenly on its own (spontaneous pneumothorax) or from injury (trauma) to the chest.   Shingles infection (herpes zoster virus).  The chest wall is composed of bones, muscles, and cartilage. Any of these can be the source of the pain.  The bones can be bruised by injury.   The muscles or cartilage can be strained by coughing or overwork.   The cartilage can be affected by inflammation and become sore (costochondritis).  DIAGNOSIS  Lab tests or other studies, such as X-rays, electrocardiography, stress testing, or cardiac imaging, may be needed to find the cause of your pain.  TREATMENT   Treatment depends on what may be causing your chest pain. Treatment may include:   Acid blockers for heartburn.   Anti-inflammatory medicine.   Pain medicine for inflammatory conditions.   Antibiotics if an infection is present.   You may be advised to change lifestyle habits. This includes stopping smoking and avoiding alcohol, caffeine, and chocolate.   You may be advised to keep your head raised (elevated) when sleeping. This reduces the chance of acid going backward from your stomach into your esophagus.   Most of the time, nonspecific chest pain will improve within 2 to 3 days with rest and mild pain medicine.  HOME CARE INSTRUCTIONS   If antibiotics were prescribed, take your antibiotics as directed. Finish them even if you start to feel better.   For the next few  days, avoid physical activities that bring on chest pain. Continue physical activities as directed.   Do not smoke.   Avoid drinking alcohol.   Only take over-the-counter or prescription medicine for pain, discomfort, or fever as directed by your caregiver.   Follow your caregiver's suggestions for further testing if your chest pain does not go away.   Keep any follow-up appointments you made. If you do not go to an appointment, you could develop lasting (chronic) problems with pain. If there is any problem keeping an appointment, you must call to reschedule.  SEEK MEDICAL CARE IF:   You think you are having problems from the medicine you are taking. Read your medicine instructions carefully.   Your chest pain does not go away, even after treatment.   You develop a rash with blisters on your chest.  SEEK IMMEDIATE MEDICAL CARE IF:   You have increased chest pain or pain that spreads to your arm, neck, jaw, back, or abdomen.   You develop shortness of breath, an increasing cough, or you are coughing up blood.   You have severe back or abdominal pain, feel nauseous, or vomit.   You develop severe weakness, fainting, or chills.   You have a fever.  THIS IS AN EMERGENCY. Do not wait to see if the pain will go away. Get medical help at once. Call your local emergency services (911 in [place] Do not drive yourself to the hospital. MAKE SURE YOU:   Understand these instructions.     Will watch your condition.   Will get help right away if you are not doing well or get worse.  Document Released: 08/09/2005 Document Revised: 10/19/2011 Document Reviewed: 06/04/2008 [employer] Patient Information 2012 [employer]. 

## 2012-04-18 ENCOUNTER — Ambulatory Visit (INDEPENDENT_AMBULATORY_CARE_PROVIDER_SITE_OTHER): Payer: Managed Care, Other (non HMO) | Admitting: Family Medicine

## 2012-04-18 ENCOUNTER — Encounter: Payer: Self-pay | Admitting: Family Medicine

## 2012-04-18 VITALS — BP 132/86 | HR 82 | Temp 98.7°F | Ht 66.0 in | Wt 254.0 lb

## 2012-04-18 DIAGNOSIS — E119 Type 2 diabetes mellitus without complications: Secondary | ICD-10-CM

## 2012-04-18 MED ORDER — HUMULIN 70/30 PEN (70-30) 100 UNIT/ML ~~LOC~~ SUPN: 40.0000 [IU] | PEN_INJECTOR | Freq: Two times a day (BID) | SUBCUTANEOUS | Status: DC

## 2012-04-18 NOTE — Assessment & Plan Note (Signed)
Uncontrolled because patient doesn't have a regular eating pattern and doesn't want to give herself insulin, she is noncompliant with monitoring and diet as well. I will see her back in 2 weeks to try and convince her further. I changed her insulin to the pen to make it more convenient. She refused lantus.

## 2012-04-18 NOTE — Patient Instructions (Signed)
Your hgA1c was 10 today. This means your diabetes is out of control.  Take your insulin daily as prescribed, eat healthy low carbs foods and check your blood sugar with the meter so I can check on your blood sugars.  Please come back and see me in 2 weeks to see how you are doing.

## 2012-04-18 NOTE — Progress Notes (Signed)
  Subjective:   Patient ID: Tanya Nolan, female DOB: Feb 14, 1976 36 y.o. MRN: [id number] HPI: Hospital follow up for chest pain. Currently denies chest pain and SOB.   1. Diabetes  36 y.o. female for follow up of diabetes. Diabetic Review of Systems - medication compliance: noncompliant much of the time, diabetic diet compliance: noncompliant much of the time, home glucose monitoring: is not performed, further diabetic ROS: no polyuria or polydipsia, no chest pain, dyspnea or TIA's, no numbness, tingling or pain in extremities, acute symptoms are no chest pain or SOB.    History  Substance Use Topics  . Smoking status: Current Everyday Smoker -- 0.5 packs/day for 9 years    Types: Cigarettes  . Smokeless tobacco: Never Used  . Alcohol Use: Yes     socially - wine/liquor    Review of Systems: Pertinent items are noted in HPI.  Labs Reviewed: yes.  Lab Results  Component Value Date   HGBA1C 10.0 04/18/2012   Reviewed Chart Review for last notes.     Objective:   Filed Vitals:   04/18/12 0947  BP: 132/86  Pulse: 82  Temp: 98.7 F (37.1 C)  TempSrc: Oral  Height: 5\' 6"  (1.676 m)  Weight: 254 lb (115.214 kg)   Physical Exam: General: aaf, obese, pleasant Lungs:  Normal respiratory effort, chest expands symmetrically. Lungs are clear to auscultation, no crackles or wheezes. Heart - Regular rate and rhythm.  No murmurs, gallops or rubs.    Abdomen: soft and non-tender without masses, organomegaly or hernias noted.  No guarding or rebound Assessment & Plan:

## 2012-04-29 ENCOUNTER — Other Ambulatory Visit: Payer: Self-pay | Admitting: Family Medicine

## 2012-04-29 MED ORDER — INSULIN PEN NEEDLE 29G X 12.7MM MISC: 1.0000 "application " | Freq: Three times a day (TID) | Status: DC

## 2012-04-29 NOTE — Telephone Encounter (Signed)
I need to know what the exact name and measurement of needles please.   Thank you,

## 2012-04-29 NOTE — Telephone Encounter (Addendum)
Patient is calling because she doesn't have any needles to go on the Humulin 70/30 Pen and needs some sent to her pharmacy.  She uses [facility].

## 2012-04-29 NOTE — Telephone Encounter (Signed)
Tanya Nolan,  She didn't know what kind accept to say the ones for the Humulin 70/30 Pen.  Sorry

## 2012-04-29 NOTE — Addendum Note (Signed)
Addended by: Edd Arbour on: 04/29/2012 05:20 PM   Modules accepted: Orders

## 2012-05-02 ENCOUNTER — Encounter: Payer: Self-pay | Admitting: Family Medicine

## 2012-05-02 ENCOUNTER — Ambulatory Visit (INDEPENDENT_AMBULATORY_CARE_PROVIDER_SITE_OTHER): Payer: Managed Care, Other (non HMO) | Admitting: Family Medicine

## 2012-05-02 VITALS — BP 129/91 | HR 81 | Temp 98.8°F | Ht 66.0 in | Wt 257.0 lb

## 2012-05-02 DIAGNOSIS — J40 Bronchitis, not specified as acute or chronic: Secondary | ICD-10-CM | POA: Insufficient documentation

## 2012-05-02 DIAGNOSIS — E119 Type 2 diabetes mellitus without complications: Secondary | ICD-10-CM

## 2012-05-02 MED ORDER — GUAIFENESIN-CODEINE 100-10 MG/5ML PO SYRP: 5.0000 mL | ORAL_SOLUTION | Freq: Three times a day (TID) | ORAL | Status: AC | PRN

## 2012-05-02 MED ORDER — ALBUTEROL SULFATE HFA 108 (90 BASE) MCG/ACT IN AERS: 2.0000 | INHALATION_SPRAY | Freq: Four times a day (QID) | RESPIRATORY_TRACT | Status: DC | PRN

## 2012-05-02 NOTE — Progress Notes (Signed)
  Subjective:   Patient ID: Tanya Nolan, female DOB: 12/23/1975 36 y.o. MRN: [id number] HPI:  1. DM2 monitoring Synopsis: patient has poorly controlled diabetes and she continues to not use her insulin as prescribed. This time its because she could not get the needles for her pen. She has not been checking blood sugar at home. She denies infections, polyuria or polydipsia.  She continues to take metformin 500 mg daily. She does not want to increase this dose because of GI side effects.   2. Bronchitis course: worsening Synopsis: patient has 1 week of worsening cough and hoarseness associated with recent viral URI.  Location: upper airways Time period: acute and recurrent Aggravating: worse at night, seasonal changes. Alleviating: inhaler in the past and cough syrup Associated symptoms: no fevers or chills, she has associated cough. Denies sinus pressure.   History  Substance Use Topics  . Smoking status: Current Everyday Smoker -- 0.5 packs/day for 9 years    Types: Cigarettes  . Smokeless tobacco: Never Used  . Alcohol Use: Yes     socially - wine/liquor    Review of Systems: Pertinent items are noted in HPI.  Labs Reviewed: yes Reviewed Chart Review for last notes.     Objective:   Filed Vitals:   05/02/12 1612  BP: 129/91  Pulse: 81  Temp: 98.8 F (37.1 C)  TempSrc: Oral  Height: 5\' 6"  (1.676 m)  Weight: 257 lb (116.574 kg)   Physical Exam: General: aaf, obese, nad, pleasant Lungs:  Normal respiratory effort, chest expands symmetrically. Lungs are clear to auscultation, no crackles or wheezes. Mouth - no lesions, mucous membranes are moist, no decaying teeth  Throat: normal mucosa, no exudate, uvula midline, no redness Sinus: non-tender to palpation.  Assessment & Plan:

## 2012-05-02 NOTE — Assessment & Plan Note (Addendum)
Prescribed robitussin AC and albuterol inhaler. She has used this treatment regimen effectively in the past.

## 2012-05-02 NOTE — Patient Instructions (Signed)
I will see you back in 2 months. I need to know what your blood sugars are doing at home, so check them two or three times a day so  I can track them. Let the pharmacy contact me for a new monitor.  I prescribed you cough syrup and an inhaler for bronchitis with cough.

## 2012-05-08 ENCOUNTER — Encounter: Payer: Managed Care, Other (non HMO) | Admitting: Gynecology

## 2012-06-13 ENCOUNTER — Encounter: Payer: Self-pay | Admitting: Gynecology

## 2012-06-13 ENCOUNTER — Ambulatory Visit (INDEPENDENT_AMBULATORY_CARE_PROVIDER_SITE_OTHER): Payer: Managed Care, Other (non HMO) | Admitting: Gynecology

## 2012-06-13 VITALS — BP 150/84 | Ht 67.5 in | Wt 258.0 lb

## 2012-06-13 DIAGNOSIS — Z01419 Encounter for gynecological examination (general) (routine) without abnormal findings: Secondary | ICD-10-CM

## 2012-06-13 NOTE — Patient Instructions (Signed)
Follow up in one year for annual exam. Start on a multivitamin with iron and folic acid. Consider stop smoking.  Consider Stop Smoking.  Help is available at [facility] smoking cessation program @ www.[place].com or 330-584-0727. OR 1-800-QUIT-NOW 646-205-5797) for free smoking cessation counseling.   Smoking Hazards Smoking cigarettes is extremely bad for your health. Tobacco smoke has over 200 known poisons in it. There are over 60 chemicals in tobacco smoke that cause cancer. Some of the chemicals found in cigarette smoke include:  Cyanide.  Benzene.  Formaldehyde.  Methanol (wood alcohol).  Acetylene (fuel used in welding torches).  Ammonia.  Cigarette smoke also contains the poisonous gases nitrogen oxide and carbon monoxide.  Cigarette smokers have an increased risk of many serious medical problems, including: Lung cancer.  Lung disease (such as pneumonia, bronchitis, and emphysema).  Heart attack and chest pain due to the heart not getting enough oxygen (angina).  Heart disease and peripheral blood vessel disease.  Hypertension.  Stroke.  Oral cancer (cancer of the lip, mouth, or voice box).  Bladder cancer.  Pancreatic cancer.  Cervical cancer.  Pregnancy complications, including premature birth.  Low birthweight babies.  Early menopause.  Lower estrogen level for women.  Infertility.  Facial wrinkles.  Blindness.  Increased risk of broken bones (fractures).  Senile dementia.  Stillbirths and smaller newborn babies, birth defects, and genetic damage to sperm.  Stomach ulcers and internal bleeding.  Children of smokers have an increased risk of the following, because of secondhand smoke exposure:  Sudden infant death syndrome (SIDS).  Respiratory infections.  Lung cancer.  Heart disease.  Ear infections.  Smoking causes approximately: 90% of all lung cancer deaths in men.  80% of all lung cancer deaths in women.  90% of deaths from chronic  obstructive lung disease.  Compared with nonsmokers, smoking increases the risk of: Coronary heart disease by 2 to 4 times.  Stroke by 2 to 4 times.  Men developing lung cancer by 23 times.  Women developing lung cancer by 13 times.  Dying from chronic obstructive lung diseases by 12 times.  Someone who smokes 2 packs a day loses about 8 years of his or her life. Even smoking lightly shortens your life expectancy by several years. You can greatly reduce the risk of medical problems for you and your family by stopping now. Smoking is the most preventable cause of death and disease in our society. Within days of quitting smoking, your circulation returns to normal, you decrease the risk of having a heart attack, and your lung capacity improves. There may be some increased phlegm in the first few days after quitting, and it may take months for your lungs to clear up completely. Quitting for 10 years cuts your lung cancer risk to almost that of a nonsmoker. WHY IS SMOKING ADDICTIVE? Nicotine is the chemical agent in tobacco that is capable of causing addiction or dependence.  When you smoke and inhale, nicotine is absorbed rapidly into the bloodstream through your lungs. Nicotine absorbed through the lungs is capable of creating a powerful addiction. Both inhaled and non-inhaled nicotine may be addictive.  Addiction studies of cigarettes and spit tobacco show that addiction to nicotine occurs mainly during the teen years, when young people begin using tobacco products.  WHAT ARE THE BENEFITS OF QUITTING?  There are many health benefits to quitting smoking.  Likelihood of developing cancer and heart disease decreases. Health improvements are seen almost immediately.  Blood pressure, pulse rate, and  breathing patterns start returning to normal soon after quitting.  People who quit may see an improvement in their overall quality of life.  Some people choose to quit all at once. Other options include  nicotine replacement products, such as patches, gum, and nasal sprays. Do not use these products without first checking with your caregiver. QUITTING SMOKING It is not easy to quit smoking. Nicotine is addicting, and longtime habits are hard to change. To start, you can write down all your reasons for quitting, tell your family and friends you want to quit, and ask for their help. Throw your cigarettes away, chew gum or cinnamon sticks, keep your hands busy, and drink extra water or juice. Go for walks and practice deep breathing to relax. Think of all the money you are saving: around $1,000 a year, for the average pack-a-day smoker. Nicotine patches and gum have been shown to improve success at efforts to stop smoking. Zyban (bupropion) is an anti-depressant drug that can be prescribed to reduce nicotine withdrawal symptoms and to suppress the urge to smoke. Smoking is an addiction with both physical and psychological effects. Joining a stop-smoking support group can help you cope with the emotional issues. For more information and advice on programs to stop smoking, call your doctor, your local hospital, or these organizations: American Lung Association - 1-800-LUNGUSA  American Cancer Society - 1-800-ACS-2345  Document Released: 12/07/2004 Document Revised: 07/12/2011 Document Reviewed: 08/11/2009 [facility] Patient Information 2012 [place], [place].  Smoking Cessation This document explains the best ways for you to quit smoking and new treatments to help. It lists new medicines that can double or triple your chances of quitting and quitting for good. It also considers ways to avoid relapses and concerns you may have about quitting, including weight gain. NICOTINE: A POWERFUL ADDICTION If you have tried to quit smoking, you know how hard it can be. It is hard because nicotine is a very addictive drug. For some people, it can be as addictive as heroin or cocaine. Usually, people make 2 or 3 tries, or  more, before finally being able to quit. Each time you try to quit, you can learn about what helps and what hurts. Quitting takes hard work and a lot of effort, but you can quit smoking. QUITTING SMOKING IS ONE OF THE MOST IMPORTANT THINGS YOU WILL EVER DO.  You will live longer, feel better, and live better.   The impact on your body of quitting smoking is felt almost immediately:   Within 20 minutes, blood pressure decreases. Pulse returns to its normal level.   After 8 hours, carbon monoxide levels in the blood return to normal. Oxygen level increases.   After 24 hours, chance of heart attack starts to decrease. Breath, hair, and body stop smelling like smoke.   After 48 hours, damaged nerve endings begin to recover. Sense of taste and smell improve.   After 72 hours, the body is virtually free of nicotine. Bronchial tubes relax and breathing becomes easier.   After 2 to 12 weeks, lungs can hold more air. Exercise becomes easier and circulation improves.   Quitting will reduce your risk of having a heart attack, stroke, cancer, or lung disease:   After 1 year, the risk of coronary heart disease is cut in half.   After 5 years, the risk of stroke falls to the same as a nonsmoker.   After 10 years, the risk of lung cancer is cut in half and the risk of other cancers  decreases significantly.   After 15 years, the risk of coronary heart disease drops, usually to the level of a nonsmoker.   If you are pregnant, quitting smoking will improve your chances of having a healthy baby.   The people you live with, especially your children, will be healthier.   You will have extra money to spend on things other than cigarettes.  FIVE KEYS TO QUITTING Studies have shown that these 5 steps will help you quit smoking and quit for good. You have the best chances of quitting if you use them together: 1. Get ready.  2. Get support and encouragement.  3. Learn new skills and behaviors.  4. Get  medicine to reduce your nicotine addiction and use it correctly.  5. Be prepared for relapse or difficult situations. Be determined to continue trying to quit, even if you do not succeed at first.  1. GET READY  Set a quit date.   Change your environment.   Get rid of ALL cigarettes, ashtrays, matches, and lighters in your home, car, and place of work.   Do not let people smoke in your home.   Review your past attempts to quit. Think about what worked and what did not.   Once you quit, do not smoke. NOT EVEN A PUFF!  2. GET SUPPORT AND ENCOURAGEMENT Studies have shown that you have a better chance of being successful if you have help. You can get support in many ways.  Tell your family, friends, and coworkers that you are going to quit and need their support. Ask them not to smoke around you.   Talk to your caregivers (doctor, dentist, nurse, pharmacist, psychologist, and/or smoking counselor).   Get individual, group, or telephone counseling and support. The more counseling you have, the better your chances are of quitting. Programs are available at [employer] and health centers. Call your local health department for information about programs in your area.   Spiritual beliefs and practices may help some smokers quit.   Quit meters are Photographer that keep track of quit statistics, such as amount of "quit-time," cigarettes not smoked, and money saved.   Many smokers find one or more of the many self-help books available useful in helping them quit and stay off tobacco.  3. LEARN NEW SKILLS AND BEHAVIORS  Try to distract yourself from urges to smoke. Talk to someone, go for a walk, or occupy your time with a task.   When you first try to quit, change your routine. Take a different route to work. Drink tea instead of coffee. Eat breakfast in a different place.   Do something to reduce your stress. Take a hot bath, exercise, or read a book.     Plan something enjoyable to do every day. Reward yourself for not smoking.   Explore interactive web-based programs that specialize in helping you quit.  4. GET MEDICINE AND USE IT CORRECTLY Medicines can help you stop smoking and decrease the urge to smoke. Combining medicine with the above behavioral methods and support can quadruple your chances of successfully quitting smoking. The U.S. Food and Drug Administration (FDA) has approved 7 medicines to help you quit smoking. These medicines fall into 3 categories.  Nicotine replacement therapy (delivers nicotine to your body without the negative effects and risks of smoking):   Nicotine gum: Available over-the-counter.   Nicotine lozenges: Available over-the-counter.   Nicotine inhaler: Available by prescription.   Nicotine nasal spray: Available by  prescription.   Nicotine skin patches (transdermal): Available by prescription and over-the-counter.   Antidepressant medicine (helps people abstain from smoking, but how this works is unknown):   Bupropion sustained-release (SR) tablets: Available by prescription.   Nicotinic receptor partial agonist (simulates the effect of nicotine in your brain):   Varenicline tartrate tablets: Available by prescription.   Ask your caregiver for advice about which medicines to use and how to use them. Carefully read the information on the package.   Everyone who is trying to quit may benefit from using a medicine. If you are pregnant or trying to become pregnant, nursing an infant, you are under age 33, or you smoke fewer than 10 cigarettes per day, talk to your caregiver before taking any nicotine replacement medicines.   You should stop using a nicotine replacement product and call your caregiver if you experience nausea, dizziness, weakness, vomiting, fast or irregular heartbeat, mouth problems with the lozenge or gum, or redness or swelling of the skin around the patch that does not go away.    Do not use any other product containing nicotine while using a nicotine replacement product.   Talk to your caregiver before using these products if you have diabetes, heart disease, asthma, stomach ulcers, you had a recent heart attack, you have high blood pressure that is not controlled with medicine, a history of irregular heartbeat, or you have been prescribed medicine to help you quit smoking.  5. BE PREPARED FOR RELAPSE OR DIFFICULT SITUATIONS  Most relapses occur within the first 3 months after quitting. Do not be discouraged if you start smoking again. Remember, most people try several times before they finally quit.   You may have symptoms of withdrawal because your body is used to nicotine. You may crave cigarettes, be irritable, feel very hungry, cough often, get headaches, or have difficulty concentrating.   The withdrawal symptoms are only temporary. They are strongest when you first quit, but they will go away within 10 to 14 days.  Here are some difficult situations to watch for:  Alcohol. Avoid drinking alcohol. Drinking lowers your chances of successfully quitting.   Caffeine. Try to reduce the amount of caffeine you consume. It also lowers your chances of successfully quitting.   Other smokers. Being around smoking can make you want to smoke. Avoid smokers.   Weight gain. Many smokers will gain weight when they quit, usually less than 10 pounds. Eat a healthy diet and stay active. Do not let weight gain distract you from your main goal, quitting smoking. Some medicines that help you quit smoking may also help delay weight gain. You can always lose the weight gained after you quit.   Bad mood or depression. There are a lot of ways to improve your mood other than smoking.  If you are having problems with any of these situations, talk to your caregiver. SPECIAL SITUATIONS AND CONDITIONS Studies suggest that everyone can quit smoking. Your situation or condition can give you  a special reason to quit.  Pregnant women/new mothers: By quitting, you protect your baby's health and your own.   Hospitalized patients: By quitting, you reduce health problems and help healing.   Heart attack patients: By quitting, you reduce your risk of a second heart attack.   Lung, head, and neck cancer patients: By quitting, you reduce your chance of a second cancer.   Parents of children and adolescents: By quitting, you protect your children from illnesses caused by secondhand smoke.  QUESTIONS  TO THINK ABOUT Think about the following questions before you try to stop smoking. You may want to talk about your answers with your caregiver.  Why do you want to quit?   If you tried to quit in the past, what helped and what did not?   What will be the most difficult situations for you after you quit? How will you plan to handle them?   Who can help you through the tough times? Your family? Friends? Caregiver?   What pleasures do you get from smoking? What ways can you still get pleasure if you quit?  Here are some questions to ask your caregiver:  How can you help me to be successful at quitting?   What medicine do you think would be best for me and how should I take it?   What should I do if I need more help?   What is smoking withdrawal like? How can I get information on withdrawal?  Quitting takes hard work and a lot of effort, but you can quit smoking. FOR MORE INFORMATION  Smokefree.gov (http://www.[employer].com/) provides free, accurate, evidence-based information and professional assistance to help support the immediate and long-term needs of people trying to quit smoking. Document Released: 10/24/2001 Document Revised: 07/12/2011 Document Reviewed: 08/16/2009 [facility] Patient Information 2012 [place], [place].

## 2012-06-13 NOTE — Progress Notes (Signed)
Tanya Nolan 06/21/1976 [id number]        36 y.o.  G0P0 for annual exam.  Several issues noted below.  Past medical history,surgical history, medications, allergies, family history and social history were all reviewed and documented in the EPIC chart. ROS:  Was performed and pertinent positives and negatives are included in the history.  Exam: Sherrilyn Rist assistant Filed Vitals:   06/13/12 1405  BP: 150/84  Height: 5' 7.5" (1.715 m)  Weight: 258 lb (117.028 kg)   General appearance  Normal Skin grossly normal Head/Neck normal with no cervical or supraclavicular adenopathy thyroid normal Lungs  clear Cardiac RR, without RMG Abdominal  soft, nontender, without masses, organomegaly or hernia Breasts  examined lying and sitting without masses, retractions, discharge or axillary adenopathy. Pelvic  Ext/BUS/vagina  normal light menses flow  Cervix  normal   Uterus  anteverted, normal size, shape and contour, midline and mobile nontender   Adnexa  Without masses or tenderness    Anus and perineum  normal   Rectovaginal  normal sphincter tone without palpated masses or tenderness.    Assessment/Plan:  36 y.o. G0P0 female for annual exam.   1. History menorrhagia. Status post hysteroscopy D&C removal of polyp December20th 2012. Her menses have been regular monthly no intermenstrual bleeding. They alternate between regular and heavy. Again reviewed options with her as previously and she is not interested in doing anything now but monitoring. Check baseline CBC. Recommend iron supplement with multivitamin. 2. Contraception. Not actively using contraception. Risks of pregnancy reviewed and she understands and accepts. 3. Pap smear. Last Pap 2012. No Pap smear done today. Low-grade SIL with negative high-risk HPV 2008. All subsequent Pap smears have been normal x5. Will plan every 3-5 your Pap smears. 4. Breast health. SBE monthly reviewed. 5. Stop smoking. I discussed stop smoking strategies with  her. 6. Health maintenance. No blood work was done today other than CBC as it is all done through her primary physician's office who follows her for her medical issues.  All one year, sooner as needed    Dara Lords MD, 2:30 PM 06/13/2012

## 2012-06-14 LAB — CBC WITH DIFFERENTIAL/PLATELET
Basophils Absolute: 0 10*3/uL (ref 0.0–0.1)
Eosinophils Absolute: 0.3 10*3/uL (ref 0.0–0.7)
Eosinophils Relative: 3 % (ref 0–5)
Lymphocytes Relative: 41 % (ref 12–46)
MCV: 81.2 fL (ref 78.0–100.0)
Neutrophils Relative %: 49 % (ref 43–77)
Platelets: 399 10*3/uL (ref 150–400)
RDW: 14.8 % (ref 11.5–15.5)
WBC: 11.2 10*3/uL — ABNORMAL HIGH (ref 4.0–10.5)

## 2012-09-23 ENCOUNTER — Encounter: Payer: Self-pay | Admitting: Home Health Services

## 2012-09-24 ENCOUNTER — Encounter: Payer: Self-pay | Admitting: Home Health Services

## 2013-01-31 ENCOUNTER — Encounter: Payer: Self-pay | Admitting: Family Medicine

## 2013-01-31 ENCOUNTER — Ambulatory Visit (INDEPENDENT_AMBULATORY_CARE_PROVIDER_SITE_OTHER): Payer: Managed Care, Other (non HMO) | Admitting: Family Medicine

## 2013-01-31 VITALS — BP 140/90 | HR 98 | Temp 98.4°F | Ht 67.0 in | Wt 258.0 lb

## 2013-01-31 DIAGNOSIS — R81 Glycosuria: Secondary | ICD-10-CM

## 2013-01-31 DIAGNOSIS — R300 Dysuria: Secondary | ICD-10-CM

## 2013-01-31 LAB — POCT URINALYSIS DIPSTICK
Nitrite, UA: NEGATIVE
Urobilinogen, UA: 0.2
pH, UA: 6

## 2013-01-31 LAB — POCT UA - MICROSCOPIC ONLY

## 2013-01-31 NOTE — Patient Instructions (Signed)
Thank you for coming in, today. You may have a urinary tract infection, but your urine does not look frankly infected.    I will check a urine culture and call you in a few days to tell you if you need an antibiotic.    In the meantime, make sure you stay hydrated. Drink EXTRA water for the next several days. Your blood sugar is high and your urine has sugar in it.    When your urine has sugar in it and when you're dehydrated, it can cause burning when you urinate.    If you have an infection, it can cause your blood sugar to be high.    Make sure you take your insulin and other medications as normal.    Keep a close eye on your blood sugar. If you start having high fevers over 101, if you start having vomiting, or if your blood sugar gets higher than 450, call the clinic. If it is after hours or on a weekend, call [phone] and ask for the family medicine emergency line. Otherwise, make an appointment to come back as you need. --Dr. Casper Harrison

## 2013-02-01 NOTE — Assessment & Plan Note (Signed)
A: UTI-like symptoms for about 1 week, with frequency, urge, and burning on urination. UA significant only for lysed blood, trace ketones and heavy glucosuria most likely due to poorly-controlled DM (CBG 308 in clinic). UTI possible, but symptoms more likely due to dehydration and irritation from concentrated/sugar-heavy urine. No yeast on urine microscopy, either.  P: Urine culture obtained today, will f/u results with pt and treat as appropriate. Reviewed better DM control, as well. Advised good hydration and trying using Azo OTC product for discomfort. Pt to return to clinic PRN for worsening symptoms. Also advised f/u with PCP for DM control.

## 2013-02-01 NOTE — Progress Notes (Signed)
  Subjective:    Patient ID: Tanya Nolan, female    DOB: [birth year], 37 y.o.   MRN: [id number]  HPI: Pt presents to clinic for concern that she may have a UTI for about 1 week. Pt has had increased frequency of urination and increased urge, but no dribbling or leakage. Pt also reports burning with urination. Pain/discomfort present when urinating and also some when walking or sitting. Symptoms started about 1 week ago, then subsided two days ago, started again yesterday, and have been worse today. Pt reports a UTI sometime around October or November with similar symptoms. Pt took a tramadol and "a muscle relaxer" this morning without much relief. Pt reports compliance with other medications (for BP and DM); pt did report eating "a brownie" about two-three hours prior to clinic appointment when CBG checked at 308 in clinic. (see A&P notes).  SH: Pt sexually active with husband, no new partners. Last intercourse several days ago, "a little painful." Current smoker, ~1 pack/day. Social EtOH use, once a month or less. No marijuana or cocaine use (pt states she "experimented" some as a teenager).  Review of Systems: As above. Some nausea, but no vomiting. No fever/chills or change in bowel habits. No diet/appetite change. No vaginal bleeding/discharge or genital rash.     Objective:   Physical Exam BP 140/90  Pulse 98  Temp(Src) 98.4 F (36.9 C) (Oral)  Ht 5\' 7"  (1.702 m)  Wt 258 lb (117.028 kg)  BMI 40.4 kg/m2  LMP 12/30/2012 Gen: obese adult female, in NAD, generally non-toxic appearing HEENT: EOMI, TMs clear bilaterally, mucous membranes slightly dry but clear Cardio: RRR, heart sounds distant but no murmur appreciated Pulm: CTAB; breath sounds distant 2/2 body habitus, but generally good air movement Abd: obese, soft, BS+; minimal RLQ tenderness to deep palpation; no CVA tenderness Ext: warm, well-perfused, no LE edema/tenderness     Assessment & Plan:

## 2013-02-01 NOTE — Assessment & Plan Note (Signed)
Most likely secondary to poorly-controlled diabetes. CBG 308 in clinic, today. Reviewed regular/daily CBG monitoring, appropriate diet, and appropriate medication management. Pt is to follow up with PCP for routine diabetes management.

## 2013-02-02 ENCOUNTER — Telehealth: Payer: Self-pay | Admitting: Family Medicine

## 2013-02-02 NOTE — Telephone Encounter (Signed)
Please call patient to inform her that her urine culture results do not show a need for an antibiotic. If she continues to have symptoms, she should continue to stay hydrated and can try Azo if she has not, yet. She should schedule an appointment with her PCP to discuss better DM control; could also offer her health coach referral. Thanks! --CMS

## 2013-02-03 ENCOUNTER — Encounter: Payer: Self-pay | Admitting: *Deleted

## 2013-02-03 NOTE — Telephone Encounter (Signed)
Called and gave message to patient.Busick, Rodena Medin

## 2013-02-24 ENCOUNTER — Telehealth: Payer: Self-pay | Admitting: *Deleted

## 2013-02-24 NOTE — Telephone Encounter (Signed)
Call made to schedule appt to f/u DM and meet with health coach. Pt stated she has seen endocrinologist, Dr Thayer Jew at [facility] (?) who has changed her meds. Reports blood sugar improved. Discussed diet and exercise. Husband cooks for her and has been broiling & baking meats. Pt stated that is all the change she can do at this time. Reports active at job and too tired when she comes home to get more exercise. Will f/u with endocrinologist in 4 mos.

## 2013-04-26 ENCOUNTER — Emergency Department (INDEPENDENT_AMBULATORY_CARE_PROVIDER_SITE_OTHER)
Admission: EM | Admit: 2013-04-26 | Discharge: 2013-04-26 | Disposition: A | Discharge status: Home / Self Care | Payer: Managed Care, Other (non HMO) | Source: Home / Self Care

## 2013-04-26 ENCOUNTER — Encounter (HOSPITAL_COMMUNITY): Payer: Self-pay | Admitting: Emergency Medicine

## 2013-04-26 DIAGNOSIS — M25552 Pain in left hip: Secondary | ICD-10-CM

## 2013-04-26 DIAGNOSIS — S76302A Unspecified injury of muscle, fascia and tendon of the posterior muscle group at thigh level, left thigh, initial encounter: Secondary | ICD-10-CM

## 2013-04-26 DIAGNOSIS — S79919A Unspecified injury of unspecified hip, initial encounter: Secondary | ICD-10-CM

## 2013-04-26 DIAGNOSIS — S79929A Unspecified injury of unspecified thigh, initial encounter: Secondary | ICD-10-CM

## 2013-04-26 MED ORDER — IBUPROFEN 800 MG PO TABS: 800.0000 mg | ORAL_TABLET | Freq: Three times a day (TID) | ORAL | Status: DC | PRN

## 2013-04-26 NOTE — ED Notes (Signed)
Pt c/o pain on left side of hip/buttocks onset 2 weeks... Reports she fell from her bed about 2-3 ft high onto wood flooring.. Pain increases w/walking... Taking ibup w/no relief... She is alert and oriented w/no signs of acute distress.

## 2013-04-26 NOTE — ED Provider Notes (Signed)
History     CSN: [id number]  Arrival date & time 04/26/13  1628   First MD Initiated Contact with Patient 04/26/13 1710      Chief Complaint  Patient presents with  . Hip Pain    (Consider location/radiation/quality/duration/timing/severity/associated sxs/prior treatment) Patient is a 37 y.o. female presenting with hip pain.  Hip Pain This is a new problem. The current episode started more than 1 week ago. The problem has not changed since onset.The symptoms are aggravated by walking (laying on that side). The symptoms are relieved by NSAIDs.    Past Medical History  Diagnosis Date  . 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 Diabetes mellitus   . Hypertension   . Hypercholesteremia   . Thyroid dysfunction   . Hypothyroidism   . Hyperlipidemia     no meds - tx with diet  . Chronic back pain     tx with ibuprofen  . Anxiety     Past Surgical History  Procedure Laterality Date  . Disc decompression  2008  . Hysteroscopy x 2  2007 / 2012    ENDOMETRIAL POLYPS REMOVED    Family History  Problem Relation Age of Onset  . Lupus Mother   . Hypertension Mother   . Hypertension Sister   . Heart disease Father   . Gout Father     History  Substance Use Topics  . Smoking status: Current Every Day Smoker -- 0.50 packs/day for 9 years    Types: Cigarettes  . Smokeless tobacco: Never Used  . Alcohol Use: Yes     Comment: socially - wine/liquor    OB History   Grav Para Term Preterm Abortions TAB SAB Ect Mult Living   0               Review of Systems  All other systems reviewed and are negative.    Allergies  Review of patient's allergies indicates no known allergies.  Home Medications   Current Outpatient Rx  Name  Route  Sig  Dispense  Refill  . albuterol (PROVENTIL HFA;VENTOLIN HFA) 108 (90 BASE) MCG/ACT inhaler  Inhalation   Inhale 2 puffs into the lungs every 6 (six) hours as needed for wheezing.   1 Inhaler   2   . Aspirin-Acetaminophen-Caffeine (GOODYS EXTRA STRENGTH PO)   Oral   Take 1 Package by mouth 2 (two) times daily as needed. For pain         . enalapril (VASOTEC) 10 MG tablet   Oral   Take 10 mg by mouth daily.           Marland Kitchen EXPIRED: HUMULIN 70/30 PEN (70-30) 100 UNIT/ML injection   Subcutaneous   Inject 40 Units into the skin 2 (two) times daily with a meal.   3 mL   12     Dispense as written.    Dispense the pen   . ibuprofen (ADVIL,MOTRIN) 800 MG tablet   Oral   Take 1 tablet (800 mg total) by mouth every 8 (eight) hours as needed. For pain   60 tablet   0   . Insulin Pen Needle 29G X 12.7MM MISC   Does not apply   1 application by Does not apply route 3 (three) times daily before meals.   90 each   6   . levothyroxine (LEVOTHROID) 137  MCG tablet   Oral   Take 137 mcg by mouth daily.           . metFORMIN (GLUCOPHAGE) 500 MG tablet   Oral   Take 500 mg by mouth daily before lunch.            LMP 03/22/2013  Physical Exam  Nursing note and vitals reviewed. Constitutional: She is oriented to person, place, and time. She appears well-developed and well-nourished.  HENT:  Head: Normocephalic and atraumatic.  Eyes: Conjunctivae are normal. Pupils are equal, round, and reactive to light.  Neck: Normal range of motion. Neck supple.  Cardiovascular: Normal rate, normal heart sounds and intact distal pulses.   Pulmonary/Chest: Effort normal.  Musculoskeletal: Normal range of motion. She exhibits no edema and no tenderness.  Reflexes, sens, strenthg wnl  Neurological: She is alert and oriented to person, place, and time. She has normal reflexes. She displays normal reflexes. No cranial nerve deficit. Coordination normal.  Skin: Skin is warm and dry.  Psychiatric: She has a normal mood and affect. Her behavior is normal.    ED Course  Procedures  (including critical care time)  Labs Reviewed - No data to display No results found.   1. Hamstring injury, left, initial encounter   2. Hip pain, acute, left       MDM  Discussed dx, rehab exercises and f/u.  meds given -see orders        Maryelizabeth Rowan, MD 04/26/13 [phone]

## 2013-06-10 ENCOUNTER — Other Ambulatory Visit: Payer: Self-pay | Admitting: Family Medicine

## 2013-06-24 ENCOUNTER — Ambulatory Visit (INDEPENDENT_AMBULATORY_CARE_PROVIDER_SITE_OTHER): Payer: Managed Care, Other (non HMO) | Admitting: Gynecology

## 2013-06-24 ENCOUNTER — Encounter: Payer: Self-pay | Admitting: Gynecology

## 2013-06-24 VITALS — BP 130/84 | Ht 67.0 in | Wt 250.0 lb

## 2013-06-24 DIAGNOSIS — Z01419 Encounter for gynecological examination (general) (routine) without abnormal findings: Secondary | ICD-10-CM

## 2013-06-24 NOTE — Patient Instructions (Signed)
Follow up in one year, sooner as needed. 

## 2013-06-24 NOTE — Progress Notes (Signed)
Tanya Nolan 02-09-1976 [id number]        37 y.o.  G0P0 for annual exam.  Several issues that are below.  Past medical history,surgical history, medications, allergies, family history and social history were all reviewed and documented in the EPIC chart.  ROS:  Performed and pertinent positives and negatives are included in the history, assessment and plan .  Exam: Kim assistant Filed Vitals:   06/24/13 1233  BP: 130/84  Height: 5\' 7"  (1.702 m)  Weight: 250 lb (113.399 kg)   General appearance  Normal Skin grossly normal Head/Neck normal with no cervical or supraclavicular adenopathy thyroid normal Lungs  clear Cardiac RR, without RMG Abdominal  soft, nontender, without masses, organomegaly or hernia Breasts  examined lying and sitting without masses, retractions, discharge or axillary adenopathy. Pelvic  Ext/BUS/vagina  normal   Cervix  normal   Uterus  anteverted, normal size, shape and contour, midline and mobile nontender   Adnexa  Without masses or tenderness    Anus and perineum  normal   Rectovaginal  normal sphincter tone without palpated masses or tenderness.    Assessment/Plan:  37 y.o. G0P0 female for annual exam, regular menses, no birth control.   1. Contraception. Patient does not use birth control and would accept pregnancy if it occurs. Not actively pursuing but not protecting against. Issues and risks of pregnancy particularly with her medical issues have been discussed. Multivitamin with folic acid. 2. History of menorrhagia. Menses are better since her hysteroscopy D&C. Regular monthly periods no intermenstrual bleeding. We'll continue to monitor. 3. Pap smear 2012. No Pap smear done today. History of LGSIL 2008. Colposcopy normal. Pap smears normal since then. Plan repeat Pap smear next year at 3 year interval. 4. Breast health. SBE monthly reviewed. Screening mammographic recommendations between 35 and 40 discussed. They're strong family history and she  plans closer to 40. 5. Health maintenance. History of smoking and patient has quit. Congratulations given. Actively sees her primary and Dr. Romero Belling for her medical issues. No blood work done as it is all done through their offices. Followup one year, sooner as needed.  Note: This document was prepared with digital dictation and possible smart phrase technology. Any transcriptional errors that result from this process are unintentional.   Dara Lords MD, 12:59 PM 06/24/2013

## 2013-06-25 LAB — URINALYSIS W MICROSCOPIC + REFLEX CULTURE
Bilirubin Urine: NEGATIVE
Glucose, UA: >1000 mg/dL — AB
Protein, ur: 100 mg/dL — AB

## 2013-06-26 ENCOUNTER — Telehealth: Payer: Self-pay | Admitting: *Deleted

## 2013-06-26 NOTE — Telephone Encounter (Signed)
Letter sent regarding diabetes follow up care Elizabeth Saesha Llerenas, RN-BSN   

## 2013-10-08 ENCOUNTER — Telehealth: Payer: Self-pay | Admitting: *Deleted

## 2013-10-08 NOTE — Telephone Encounter (Signed)
C/o "thick white discharge" coming from breasts.  White like milk, but is thick.  Denies any blood in discharge or pain.  Performed SBE and denies any lumps or anything abnormal.  Denies any birth control, but has had unprotected intercourse.  LMP "end of last month" and due to start period.  Patient requesting appt for Monday.    Scheduled appt with Dr. Armen Pickup for 10/13/13 at 10:45 am.  Patient advised to go to urgent care or ED if develops pain, fever, or bloody discharge.  Gaylene Brooks, RN

## 2013-10-13 ENCOUNTER — Encounter: Payer: Self-pay | Admitting: Family Medicine

## 2013-10-13 ENCOUNTER — Ambulatory Visit (INDEPENDENT_AMBULATORY_CARE_PROVIDER_SITE_OTHER): Payer: Managed Care, Other (non HMO) | Admitting: Family Medicine

## 2013-10-13 VITALS — BP 153/99 | HR 90 | Temp 99.5°F | Ht 67.0 in | Wt 256.0 lb

## 2013-10-13 DIAGNOSIS — Z91148 Patient's other noncompliance with medication regimen for other reason: Secondary | ICD-10-CM

## 2013-10-13 DIAGNOSIS — Z9114 Patient's other noncompliance with medication regimen: Secondary | ICD-10-CM | POA: Insufficient documentation

## 2013-10-13 DIAGNOSIS — N6452 Nipple discharge: Secondary | ICD-10-CM

## 2013-10-13 DIAGNOSIS — Z9119 Patient's noncompliance with other medical treatment and regimen: Secondary | ICD-10-CM

## 2013-10-13 DIAGNOSIS — N6459 Other signs and symptoms in breast: Secondary | ICD-10-CM

## 2013-10-13 HISTORY — DX: Patient's other noncompliance with medication regimen: Z91.14

## 2013-10-13 HISTORY — DX: Nipple discharge: N64.52

## 2013-10-13 HISTORY — DX: Patient's other noncompliance with medication regimen for other reason: Z91.148

## 2013-10-13 LAB — POCT URINE PREGNANCY: Preg Test, Ur: NEGATIVE

## 2013-10-13 NOTE — Patient Instructions (Signed)
Thank you for coming in today. We will call you with mammogram and breast U/S appt.  F/u with Dr. Lum Babe next week to discuss results and for diabetes and HTN management.  Dr. Armen Pickup

## 2013-10-13 NOTE — Assessment & Plan Note (Signed)
Non compliant with medication. She reports non compliance with metformin due to GI upset. No reason given for noncompliance with Ace inhibitor.   Plan for pcp to address. Consider switching antihypertensive to methyldopa, labetolol or nifedipine vs. Initiating contraception (recommended given uncontrolled DM2 and HTN)  given intercourse w/o contraception.

## 2013-10-13 NOTE — Assessment & Plan Note (Signed)
A: normal exam and negative U preg. Unilateral discharge is concerning for pathology. P: Diagnostic ultrasound and mammogram. PCP f/u in one week.

## 2013-10-13 NOTE — Progress Notes (Signed)
   Subjective:    Patient ID: Tanya Nolan, female    DOB: February 22, 1976, 37 y.o.   MRN: [id number] CC: b/l nipple discharge R >L  HPI 37 yo F presents for same day visit with R nipple discharge since . Discharge is thick and white, non-bloody. Non painful. Occurs on night. Has been on the left once, but is mostly on the R. Has lessened in amount over the past two days. No previous history of nipple discharge. She is sexually active. No birth control. No compliant with medication which included enalapril, metformin (2-3 x per week) and insulin. She has performed a self breast exam which has been normal.   LMP: 09/07/2013. Sexually active. No contraception use.   Health care maintenance: due for flu shot today. Given.  Review of Systems As per HPI Denies HA, vision changes, medication use other than what is prescribed, weight changes, fever.     Objective:   Physical Exam BP 153/99  Pulse 90  Temp(Src) 99.5 F (37.5 C) (Oral)  Ht 5\' 7"  (1.702 m)  Wt 256 lb (116.121 kg)  BMI 40.09 kg/m2  LMP 09/07/2013 General appearance: alert, cooperative and no distress Breasts: normal appearance, no masses or tenderness, Inspection negative, No nipple retraction or dimpling, No nipple discharge or bleeding, No axillary or supraclavicular adenopathy, Normal to palpation without dominant masses  U preg: negative     Assessment & Plan:

## 2013-10-27 ENCOUNTER — Ambulatory Visit
Admission: RE | Admit: 2013-10-27 | Discharge: 2013-10-27 | Disposition: A | Discharge status: Home / Self Care | Payer: Managed Care, Other (non HMO) | Source: Ambulatory Visit | Attending: Family Medicine | Admitting: Family Medicine

## 2013-10-27 ENCOUNTER — Telehealth: Payer: Self-pay | Admitting: *Deleted

## 2013-10-27 DIAGNOSIS — N6452 Nipple discharge: Secondary | ICD-10-CM

## 2013-10-27 NOTE — Telephone Encounter (Signed)
Message copied by Osborne Oman on Mon Oct 27, 2013  2:50 PM ------      Message from: Tanya Nolan      Created: Mon Oct 27, 2013  2:35 PM       Negative diagnostic mammogram.       Please inform patient.      Next screening mammogram, age 37.      Patient to f.u with Dr. Lum Babe if nipple discharge continues/returns. ------

## 2013-10-27 NOTE — Telephone Encounter (Signed)
LMOVM for pt to return call .Fleeger, Jessica Dawn  

## 2014-01-01 ENCOUNTER — Telehealth: Payer: Self-pay | Admitting: *Deleted

## 2014-01-01 DIAGNOSIS — IMO0002 Reserved for concepts with insufficient information to code with codable children: Secondary | ICD-10-CM

## 2014-01-01 DIAGNOSIS — E1165 Type 2 diabetes mellitus with hyperglycemia: Secondary | ICD-10-CM

## 2014-01-01 NOTE — Telephone Encounter (Signed)
LMOVM for pt to return call.  Please advise that since she is diabetic she is due for a cholesterol check and to see her PCP for a DM f/u.  Marland Kitchen Meng [place], Salome Spotted

## 2014-03-18 ENCOUNTER — Telehealth: Payer: Self-pay | Admitting: *Deleted

## 2014-03-18 NOTE — Telephone Encounter (Signed)
Called and left message on voicemail for patient to cal back to schedule an appointment for follow up diabetes and cholesterol.

## 2014-05-07 NOTE — Telephone Encounter (Signed)
LM for patient to call back and schedule an appt with MD .Tanya Nolan

## 2014-06-29 ENCOUNTER — Encounter: Payer: Self-pay | Admitting: Gynecology

## 2014-06-29 ENCOUNTER — Ambulatory Visit (INDEPENDENT_AMBULATORY_CARE_PROVIDER_SITE_OTHER): Payer: Managed Care, Other (non HMO) | Admitting: Gynecology

## 2014-06-29 ENCOUNTER — Other Ambulatory Visit (HOSPITAL_COMMUNITY)
Admission: RE | Admit: 2014-06-29 | Discharge: 2014-06-29 | Disposition: A | Discharge status: Home / Self Care | Payer: Managed Care, Other (non HMO) | Source: Ambulatory Visit | Attending: Gynecology | Admitting: Gynecology

## 2014-06-29 VITALS — BP 140/86 | Ht 67.0 in | Wt 264.0 lb

## 2014-06-29 DIAGNOSIS — Z1151 Encounter for screening for human papillomavirus (HPV): Secondary | ICD-10-CM | POA: Insufficient documentation

## 2014-06-29 DIAGNOSIS — Z01419 Encounter for gynecological examination (general) (routine) without abnormal findings: Secondary | ICD-10-CM | POA: Diagnosis present

## 2014-06-29 DIAGNOSIS — Z309 Encounter for contraceptive management, unspecified: Secondary | ICD-10-CM

## 2014-06-29 NOTE — Patient Instructions (Signed)
Followup in 1 year for annual exam, sooner if any issues. Followup if you want to rediscuss birth control options.  You may obtain a copy of any labs that were done today by logging onto MyChart as outlined in the instructions provided with your AVS (after visit summary). The office will not call with normal lab results but certainly if there are any significant abnormalities then we will contact you.   Health Maintenance, Female A healthy lifestyle and preventative care can promote health and wellness.  Maintain regular health, dental, and eye exams.  Eat a healthy diet. Foods like vegetables, fruits, whole grains, low-fat dairy products, and lean protein foods contain the nutrients you need without too many calories. Decrease your intake of foods high in solid fats, added sugars, and salt. Get information about a proper diet from your caregiver, if necessary.  Regular physical exercise is one of the most important things you can do for your health. Most adults should get at least 150 minutes of moderate-intensity exercise (any activity that increases your heart rate and causes you to sweat) each week. In addition, most adults need muscle-strengthening exercises on 2 or more days a week.   Maintain a healthy weight. The body mass index (BMI) is a screening tool to identify possible weight problems. It provides an estimate of body fat based on height and weight. Your caregiver can help determine your BMI, and can help you achieve or maintain a healthy weight. For adults 20 years and older:  A BMI below 18.5 is considered underweight.  A BMI of 18.5 to 24.9 is normal.  A BMI of 25 to 29.9 is considered overweight.  A BMI of 30 and above is considered obese.  Maintain normal blood lipids and cholesterol by exercising and minimizing your intake of saturated fat. Eat a balanced diet with plenty of fruits and vegetables. Blood tests for lipids and cholesterol should begin at age 62 and be repeated  every 5 years. If your lipid or cholesterol levels are high, you are over 50, or you are a high risk for heart disease, you may need your cholesterol levels checked more frequently.Ongoing high lipid and cholesterol levels should be treated with medicines if diet and exercise are not effective.  If you smoke, find out from your caregiver how to quit. If you do not use tobacco, do not start.  Lung cancer screening is recommended for adults aged 25 80 years who are at high risk for developing lung cancer because of a history of smoking. Yearly low-dose computed tomography (CT) is recommended for people who have at least a 30-pack-year history of smoking and are a current smoker or have quit within the past 15 years. A pack year of smoking is smoking an average of 1 pack of cigarettes a day for 1 year (for example: 1 pack a day for 30 years or 2 packs a day for 15 years). Yearly screening should continue until the smoker has stopped smoking for at least 15 years. Yearly screening should also be stopped for people who develop a health problem that would prevent them from having lung cancer treatment.  If you are pregnant, do not drink alcohol. If you are breastfeeding, be very cautious about drinking alcohol. If you are not pregnant and choose to drink alcohol, do not exceed 1 drink per day. One drink is considered to be 12 ounces (355 mL) of beer, 5 ounces (148 mL) of wine, or 1.5 ounces (44 mL) of liquor.  Avoid  use of street drugs. Do not share needles with anyone. Ask for help if you need support or instructions about stopping the use of drugs.  High blood pressure causes heart disease and increases the risk of stroke. Blood pressure should be checked at least every 1 to 2 years. Ongoing high blood pressure should be treated with medicines, if weight loss and exercise are not effective.  If you are 43 to 36 years old, ask your caregiver if you should take aspirin to prevent strokes.  Diabetes  screening involves taking a blood sample to check your fasting blood sugar level. This should be done once every 3 years, after age 22, if you are within normal weight and without risk factors for diabetes. Testing should be considered at a younger age or be carried out more frequently if you are overweight and have at least 1 risk factor for diabetes.  Breast cancer screening is essential preventative care for women. You should practice "breast self-awareness." This means understanding the normal appearance and feel of your breasts and may include breast self-examination. Any changes detected, no matter how small, should be reported to a caregiver. Women in their 6s and 30s should have a clinical breast exam (CBE) by a caregiver as part of a regular health exam every 1 to 3 years. After age 50, women should have a CBE every year. Starting at age 30, women should consider having a mammogram (breast X-ray) every year. Women who have a family history of breast cancer should talk to their caregiver about genetic screening. Women at a high risk of breast cancer should talk to their caregiver about having an MRI and a mammogram every year.  Breast cancer gene (BRCA)-related cancer risk assessment is recommended for women who have family members with BRCA-related cancers. BRCA-related cancers include breast, ovarian, tubal, and peritoneal cancers. Having family members with these cancers may be associated with an increased risk for harmful changes (mutations) in the breast cancer genes BRCA1 and BRCA2. Results of the assessment will determine the need for genetic counseling and BRCA1 and BRCA2 testing.  The Pap test is a screening test for cervical cancer. Women should have a Pap test starting at age 19. Between ages 62 and 49, Pap tests should be repeated every 2 years. Beginning at age 3, you should have a Pap test every 3 years as long as the past 3 Pap tests have been normal. If you had a hysterectomy for a  problem that was not cancer or a condition that could lead to cancer, then you no longer need Pap tests. If you are between ages 68 and 64, and you have had normal Pap tests going back 10 years, you no longer need Pap tests. If you have had past treatment for cervical cancer or a condition that could lead to cancer, you need Pap tests and screening for cancer for at least 20 years after your treatment. If Pap tests have been discontinued, risk factors (such as a new sexual partner) need to be reassessed to determine if screening should be resumed. Some women have medical problems that increase the chance of getting cervical cancer. In these cases, your caregiver may recommend more frequent screening and Pap tests.  The human papillomavirus (HPV) test is an additional test that may be used for cervical cancer screening. The HPV test looks for the virus that can cause the cell changes on the cervix. The cells collected during the Pap test can be tested for HPV. The HPV  test could be used to screen women aged 32 years and older, and should be used in women of any age who have unclear Pap test results. After the age of 73, women should have HPV testing at the same frequency as a Pap test.  Colorectal cancer can be detected and often prevented. Most routine colorectal cancer screening begins at the age of 96 and continues through age 52. However, your caregiver may recommend screening at an earlier age if you have risk factors for colon cancer. On a yearly basis, your caregiver may provide home test kits to check for hidden blood in the stool. Use of a small camera at the end of a tube, to directly examine the colon (sigmoidoscopy or colonoscopy), can detect the earliest forms of colorectal cancer. Talk to your caregiver about this at age 21, when routine screening begins. Direct examination of the colon should be repeated every 5 to 10 years through age 92, unless early forms of pre-cancerous polyps or small growths  are found.  Hepatitis C blood testing is recommended for all people born from [birth year] through 1963 and any individual with known risks for hepatitis C.  Practice safe sex. Use condoms and avoid high-risk sexual practices to reduce the spread of sexually transmitted infections (STIs). Sexually active women aged 65 and younger should be checked for Chlamydia, which is a common sexually transmitted infection. Older women with new or multiple partners should also be tested for Chlamydia. Testing for other STIs is recommended if you are sexually active and at increased risk.  Osteoporosis is a disease in which the bones lose minerals and strength with aging. This can result in serious bone fractures. The risk of osteoporosis can be identified using a bone density scan. Women ages 74 and over and women at risk for fractures or osteoporosis should discuss screening with their caregivers. Ask your caregiver whether you should be taking a calcium supplement or vitamin D to reduce the rate of osteoporosis.  Menopause can be associated with physical symptoms and risks. Hormone replacement therapy is available to decrease symptoms and risks. You should talk to your caregiver about whether hormone replacement therapy is right for you.  Use sunscreen. Apply sunscreen liberally and repeatedly throughout the day. You should seek shade when your shadow is shorter than you. Protect yourself by wearing long sleeves, pants, a wide-brimmed hat, and sunglasses year round, whenever you are outdoors.  Notify your caregiver of new moles or changes in moles, especially if there is a change in shape or color. Also notify your caregiver if a mole is larger than the size of a pencil eraser.  Stay current with your immunizations. Document Released: 05/15/2011 Document Revised: 02/24/2013 Document Reviewed: 05/15/2011 [facility] Patient Information 2014 [place].

## 2014-06-29 NOTE — Progress Notes (Signed)
Tanya Nolan [birth year] [id number]        38 y.o.  G0P0 for annual exam.  Several issues noted below.  Past medical history,surgical history, problem list, medications, allergies, family history and social history were all reviewed and documented as reviewed in the EPIC chart.  ROS:  12 system ROS performed with pertinent positives and negatives included in the history, assessment and plan.   Additional significant findings :  None   Exam: Kim Counsellor Vitals:   06/29/14 1539  BP: 140/86  Height: 5\' 7"  (1.702 m)  Weight: 264 lb (119.75 kg)   General appearance:  Normal affect, orientation and appearance. Skin: Grossly normal HEENT: Without gross lesions.  No cervical or supraclavicular adenopathy. Thyroid normal.  Lungs:  Clear without wheezing, rales or rhonchi Cardiac: RR, without RMG Abdominal:  Soft, nontender, without masses, guarding, rebound, organomegaly or hernia Breasts:  Examined lying and sitting without masses, retractions, discharge or axillary adenopathy. Pelvic:  Ext/BUS/vagina with small pustule right mid labia majora  Cervix normal. Pap/HPV  Uterus anteverted, normal size, shape and contour, midline and mobile nontender   Adnexa  Without masses or tenderness    Anus and perineum  Normal   Rectovaginal  Normal sphincter tone without palpated masses or tenderness.    Assessment/Plan:  38 y.o. G0P0 female for annual exam with irregular menses, no birth control..   1. Right labia majora pustule. Patient notes it has slowly enlarged and started draining last night. Remainder of contents removed today. Patient will monitor assuming it totally heals and will follow. If there is any persistence of any nodularity or recurrence she'll represent for further evaluation. 2. Contraceptive management. I again reviewed her not using contraception and her risk of getting pregnant. She does have a history of diabetes hypertension hypercholesterolemia. She is on a ACE  inhibitor and the need to discontinue before pregnancy or ASAP after finding out she is pregnant discussed. I again reviewed birth control options with her and she is not interested in any contraceptive option. She understands the risks given her medical issues/age and accepts these. Recommended followup if she wants to rediscuss options which I think an IUD would be a great choice given her other medical issues. Multivitamin with folic acid preconceptionally. 3. Mammography 2014. Recommend repeat at age 29. SBE monthly reviewed. 4. Pap smear 2012. Pap/HPV today. History of LGSIL 2008. Colposcopy was normal and followup Pap smears were normal after that. Assuming this Pap smear is normal then plan repeat at 3-5 year interval per current screening guidelines. 5. Health maintenance. No blood work done as this is done through her primary physician's office. Followup one year, sooner as needed.   Note: This document was prepared with digital dictation and possible smart phrase technology. Any transcriptional errors that result from this process are unintentional.   Anastasio Auerbach MD, 4:00 PM 06/29/2014

## 2014-06-29 NOTE — Addendum Note (Signed)
Addended by: Nelva Nay on: 06/29/2014 04:17 PM   Modules accepted: Orders

## 2014-07-02 LAB — CYTOLOGY - PAP

## 2014-07-22 ENCOUNTER — Encounter: Payer: Managed Care, Other (non HMO) | Attending: Endocrinology | Admitting: *Deleted

## 2014-07-22 ENCOUNTER — Encounter: Payer: Self-pay | Admitting: *Deleted

## 2014-07-22 VITALS — Ht 66.0 in | Wt 269.6 lb

## 2014-07-22 DIAGNOSIS — E119 Type 2 diabetes mellitus without complications: Secondary | ICD-10-CM | POA: Diagnosis present

## 2014-07-22 DIAGNOSIS — Z794 Long term (current) use of insulin: Secondary | ICD-10-CM | POA: Insufficient documentation

## 2014-07-22 DIAGNOSIS — Z713 Dietary counseling and surveillance: Secondary | ICD-10-CM | POA: Insufficient documentation

## 2014-07-22 NOTE — Progress Notes (Signed)
Appt start time: 1100 end time:  1230.  Assessment:  Patient was seen on  07/22/14 for individual diabetes education. 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 She does not know how to cook anything but pasta. Most meals are obtained at [employer]. Insulin has been prescribed but is not taken on a regular basis. She frequently will take her Lantus once daily and varying times. She eats meals sporadically during the day and usually does not cover with Humalog.  We have identified many opportunities for improvement. At this time Aiza has committed to the following modifications: *Take Lantus BID daily *Try to cover as many meals as possible with Novolog *Modify all beverages to sugar free *We will meet again in one month   At f/u appointment we will address: better food choices, elimination of fruit juice, eating more regularly to enable coverage of all meals with Novolog.  Patient Education Plan per assessed needs and concerns is to attend individual session for Diabetes Self Management Education.  Current HbA1c: 13.6%  Preferred Learning Style:   Auditory  Visual  Hands on  No preference indicated   Learning Readiness:   Not ready  MEDICATIONS: Lantus, Humalog when she remembers to take it.  DIETARY INTAKE:  B ( AM): None  Snk ( AM): none  L ( 12 PM): fast food, chicken salad sandwich, regular lemon ade, Snk ( 3:30PM): chips, regular soda, peanuts D ( 5PM): fast food Snk ( 1:00AM): fast food Beverages: regular soda, regular lemon ade, water, sweet tea, fruit juice  Usual physical activity: none    Intervention:  Nutrition counseling provided.  Discussed diabetes disease process and treatment options.  Discussed physiology of diabetes and role of obesity on insulin resistance.  Encouraged moderate weight reduction to improve glucose levels.  Discussed role of medications and diet in glucose control  Provided education on macronutrients on glucose levels.   Provided education on carb counting, importance of regularly scheduled meals/snacks, and meal planning  Discussed effects of physical activity on glucose levels and long-term glucose control.  Recommended 150 minutes of physical activity/week.  Reviewed patient medications.  Discussed role of medication on blood glucose and possible side effects  Discussed blood glucose monitoring and interpretation.  Discussed recommended target ranges and individual ranges.    Described short-term complications: hyper- and hypo-glycemia.  Discussed causes,symptoms, and treatment options.  Discussed prevention, detection, and treatment of long-term complications.  Discussed the role of prolonged elevated glucose levels on body systems.  Discussed role of stress on blood glucose levels and discussed strategies to manage psychosocial issues.  Discussed recommendations for long-term diabetes self-care.  Established checklist for medical, dental, and emotional self-care.  Teaching Method Utilized:  Visual Auditory Hands on  Handouts given during visit include: Living Well with Diabetes Carb Counting and Food Label handouts Meal Plan Card  Barriers to learning/adherence to lifestyle change: commitment  Diabetes self-care support plan:   [facility] support group  Demonstrated degree of understanding via:  Teach Back   Monitoring/Evaluation:  Dietary intake, exercise, test glucose, and body weight  F/u 1 months.

## 2014-09-02 ENCOUNTER — Encounter: Payer: Managed Care, Other (non HMO) | Attending: Endocrinology | Admitting: *Deleted

## 2014-09-02 ENCOUNTER — Encounter: Payer: Self-pay | Admitting: *Deleted

## 2014-09-02 VITALS — Wt 264.6 lb

## 2014-09-02 DIAGNOSIS — Z794 Long term (current) use of insulin: Secondary | ICD-10-CM | POA: Insufficient documentation

## 2014-09-02 DIAGNOSIS — E1165 Type 2 diabetes mellitus with hyperglycemia: Secondary | ICD-10-CM

## 2014-09-02 DIAGNOSIS — IMO0002 Reserved for concepts with insufficient information to code with codable children: Secondary | ICD-10-CM

## 2014-09-02 DIAGNOSIS — Z713 Dietary counseling and surveillance: Secondary | ICD-10-CM | POA: Diagnosis not present

## 2014-09-02 NOTE — Progress Notes (Signed)
Diabetes Self-Management Education  Visit Type:   Follow Up of DSME  Appt. Start Time: 1400 Appt. End Time: 1500  09/02/2014  Ms. Shalawn Wynder, identified by name and date of birth, is a 38 y.o. female with a diagnosis of Diabetes:  .  Other people present during visit:  Patient . I expressed to the patient my great deal of concern for her physical and mental health. She is grossly noncompliant with her medication regimen.  She had poor eye contact and flat affect. She declined seeing a counselor yet noted that she probably needed to see one. I encouraged her to discuss this with Dr. Chalmers Cater.  ASSESSMENT  Weight 264 lb 9.6 oz (120.022 kg). Body mass index is 42.73 kg/(m^2).   Subsequent Visit Information:  Since your last visit, have you continued or began the use of a meal plan?: No Since your last visit, have you continued or began to exercise on a consistent basis?: No Since your last visit have you continued or begun to take your medications as prescribed?: No (Patient frequently forgets or doesn't bother to take insulin. ) Since your last visit are you checking your feet?: No Since your last visit have you experienced any weight changes?: Loss Weight Loss (lbs): 5 Since your last visit, are you checking your blood glucose at least once a day?: No  Psychosocial:   Patient Belief/Attitude about Diabetes: Other (comment) (Unable to explain her lack of motivation or concern to care for her diabetes.) Self-care barriers: Lack of transportation Self-management support: Doctor's office;CDE visits Other persons present: Patient Patient Concerns: Other (comment) (none noted) Preferred Learning Style: No preference indicated Learning Readiness: Not Ready  Complications:   How often do you check your blood sugar?: 0 times/day (not testing) Fasting Blood glucose range (mg/dL):  (glucose tested  @1430  276mg /dl  1hpp)  Exercise:  Exercise: ADL's  Individualized Plan for Diabetes  Self-Management Training:   Learning Objective:  Patient will have a greater understanding of diabetes self-management.  Education Topics Reviewed with Patient Today:   Reviewed patients medication for diabetes, action, purpose, timing of dose and side effects. Purpose and frequency of SMBG.  elationship between chronic complications and blood glucose control;Assessed and discussed foot care and prevention of foot problems;Lipid levels, blood glucose control and heart disease;Reviewed with patient heart disease, higher risk of, and prevention Helped patient identify a support system for diabetes management;Identified and addressed patients feelings and concerns about diabetes (Provided patient with referral to local psychologist for use if desired) Role of family planning for patients with diabetes   PATIENTS GOALS/Plan (Developed by the patient):  Medications: take my medication as prescribed (Take lantus consistently at approximately 12 hour intervals regardless of the time of day.) Health Coping: Other (comment) (Patient recognizes that she would benefit from psychologist/counseling but does not think she will seek assistance.)  Patient Instructions  Take Lantus consistently every day at approximately 12 hours apart. The specific time of day day not matter. 1:30pm and 1:00AM is acceptable. Cover each meal with 10units of Humalog Consider seeking psychological counseling. Speak with Dr. Chalmers Cater about psychosocial issues.  Expected Outcomes:  Demonstrated limited interest in learning.  Expect minimal changes  If problems or questions, patient to contact team via:  Phone  Future DSME appointment: - PRN

## 2014-09-02 NOTE — Patient Instructions (Signed)
Take Lantus consistently every day at approximately 12 hours apart. The specific time of day day not matter. 1:30pm and 1:00AM is acceptable. Cover each meal with 10units of Humalog Consider seeking psychological counseling. Speak with Dr. Chalmers Cater about psychosocial issues.

## 2014-09-03 ENCOUNTER — Telehealth: Payer: Self-pay | Admitting: *Deleted

## 2014-09-03 NOTE — Telephone Encounter (Signed)
Thanks

## 2014-09-03 NOTE — Telephone Encounter (Signed)
Spoke with patient and she sees [facility].  She was just seen there in august and has an appt to see them again in November.  Asked patient to sign a release of records while she at her next appt so we can see her notes from them. Jazmin Hartsell,CMA

## 2014-09-03 NOTE — Telephone Encounter (Signed)
Message copied by Valerie Roys on Thu Sep 03, 2014  9:17 AM ------      Message from: Andrena Mews T      Created: Wed Sep 02, 2014  4:38 PM       Please have patient follow up with me for DM care. Thanks. ------

## 2014-09-16 ENCOUNTER — Ambulatory Visit (INDEPENDENT_AMBULATORY_CARE_PROVIDER_SITE_OTHER): Payer: Managed Care, Other (non HMO) | Admitting: Family Medicine

## 2014-09-16 ENCOUNTER — Encounter: Payer: Self-pay | Admitting: Family Medicine

## 2014-09-16 VITALS — BP 159/93 | HR 76 | Temp 98.3°F | Ht 67.0 in | Wt 274.6 lb

## 2014-09-16 DIAGNOSIS — R6884 Jaw pain: Secondary | ICD-10-CM | POA: Insufficient documentation

## 2014-09-16 MED ORDER — CYCLOBENZAPRINE HCL 10 MG PO TABS: 10.0000 mg | ORAL_TABLET | Freq: Three times a day (TID) | ORAL | Status: DC | PRN

## 2014-09-16 MED ORDER — PENICILLIN V POTASSIUM 500 MG PO TABS: 500.0000 mg | ORAL_TABLET | Freq: Four times a day (QID) | ORAL | Status: DC

## 2014-09-16 MED ORDER — IBUPROFEN 800 MG PO TABS: 800.0000 mg | ORAL_TABLET | Freq: Three times a day (TID) | ORAL | Status: DC | PRN

## 2014-09-16 NOTE — Progress Notes (Signed)
   Subjective:    Patient ID: Tanya Nolan, female    DOB: Oct 27, 1976, 38 y.o.   MRN: [id number]  HPI Tanya Nolan is a 38 y.o. presents to same-day clinic  Left-sided facial pain: Patient presents to same-day clinic with a 1 week history of left-sided facial pain/ear pain and a one-day history of sore throat. Patient denies fever, chills, diarrhea, muscle aches, rhinorrhea. She admits to nausea and vomiting approximately 2 weeks ago, but nothing recent. She is tolerating a regular diet. She recently saw the dentist, approximately 1 week ago for what she felt was teeth pain after having her feelings replaced the week prior on the left side.  At that time the dentist did a x-ray, which patient says was normal. The dentist believes she had sensitive teeth from the fillings. Patient did not receive a flu shot this year.  Patient is a former smoker  Past Medical History  Diagnosis Date  . 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 Diabetes mellitus   . Hypertension   . Hypercholesteremia   . Thyroid dysfunction   . Hypothyroidism   . Hyperlipidemia     no meds - tx with diet  . Chronic back pain     tx with ibuprofen  . Anxiety     Review of Systems Per history of present illness    Objective:   Physical Exam BP 159/93 mmHg  Pulse 76  Temp(Src) 98.3 F (36.8 C) (Oral)  Ht 5\' 7"  (1.702 m)  Wt 274 lb 9.6 oz (124.558 kg)  BMI 43.00 kg/m2 Gen: African-American female, no acute distress, nontoxic in appearance, well-developed, well-nourished, morbidly obese. HEENT: AT. . Very mild facial swelling left maxillary area.Bilateral TM visualized and normal in appearance. Bilateral eyes without injections or icterus. MMM. Bilateral nares without erythema or swelling. Throat without erythema or exudates. Mouth with poor dentition, multiple fillings on left side  of mouth. No obvious erythema, abscess or drainage.mild sensitivity to left back molar. Bilateral cervical anterior lymphadenopathy. Tenderness to palpation over left TMJ, no audible click. Neck: Supple, posterior muscle spasm of paraspinal left greater than right CV: RRR  Chest: CTAB, no wheeze or crackles    Assessment & Plan:

## 2014-09-16 NOTE — Patient Instructions (Signed)
It is likely you are having either jaw pain or teeth pain that is radiating to your ear. I prescribed choose an antibiotic to be taken for 10 days to make certain it is not a tooth infection, although it doesn't appear as if this is the case. In addition I have prescribed you ibuprofen 800 mg every 8 hours as needed for pain, and Flexeril to help with the muscle spasm. Please return in 2 weeks if you are not feeling better.

## 2014-09-16 NOTE — Assessment & Plan Note (Addendum)
Uncertain etiology of pain, patient was tender to palpation over TMJ. Very mild left sided facial swelling, poor dentition with multiple fillings with mild sensitivity to teeth on exam, but without obvious abscess. - Will treat with penicillin for concerns of tooth infection with mild swelling. - Ibuprofen 800 mg for pain - Flexeril for muscle spasm and possible TMJ flare. Follow-up in 2 weeks or sooner if no improvement

## 2014-12-25 ENCOUNTER — Encounter: Payer: Self-pay | Admitting: Family Medicine

## 2014-12-25 ENCOUNTER — Ambulatory Visit (INDEPENDENT_AMBULATORY_CARE_PROVIDER_SITE_OTHER): Payer: Managed Care, Other (non HMO) | Admitting: Family Medicine

## 2014-12-25 ENCOUNTER — Ambulatory Visit: Payer: Managed Care, Other (non HMO) | Admitting: Family Medicine

## 2014-12-25 VITALS — BP 132/88 | HR 71 | Temp 98.6°F | Wt 270.0 lb

## 2014-12-25 DIAGNOSIS — R42 Dizziness and giddiness: Secondary | ICD-10-CM | POA: Insufficient documentation

## 2014-12-25 DIAGNOSIS — L600 Ingrowing nail: Secondary | ICD-10-CM | POA: Insufficient documentation

## 2014-12-25 DIAGNOSIS — E118 Type 2 diabetes mellitus with unspecified complications: Secondary | ICD-10-CM

## 2014-12-25 HISTORY — DX: Ingrowing nail: L60.0

## 2014-12-25 LAB — CBC WITH DIFFERENTIAL/PLATELET
Basophils Absolute: 0 10*3/uL (ref 0.0–0.1)
Basophils Relative: 0 % (ref 0–1)
EOS ABS: 0.1 10*3/uL (ref 0.0–0.7)
Eosinophils Relative: 1 % (ref 0–5)
HCT: 38 % (ref 36.0–46.0)
Hemoglobin: 12.6 g/dL (ref 12.0–15.0)
Lymphocytes Relative: 42 % (ref 12–46)
Lymphs Abs: 4.4 10*3/uL — ABNORMAL HIGH (ref 0.7–4.0)
MCH: 27.9 pg (ref 26.0–34.0)
MCHC: 33.2 g/dL (ref 30.0–36.0)
MCV: 84.3 fL (ref 78.0–100.0)
MPV: 9.6 fL (ref 8.6–12.4)
Monocytes Absolute: 0.6 10*3/uL (ref 0.1–1.0)
Monocytes Relative: 6 % (ref 3–12)
NEUTROS PCT: 51 % (ref 43–77)
Neutro Abs: 5.4 10*3/uL (ref 1.7–7.7)
Platelets: 355 10*3/uL (ref 150–400)
RBC: 4.51 MIL/uL (ref 3.87–5.11)
RDW: 14.5 % (ref 11.5–15.5)
WBC: 10.5 10*3/uL (ref 4.0–10.5)

## 2014-12-25 LAB — LIPID PANEL
CHOL/HDL RATIO: 8 ratio
Cholesterol: 239 mg/dL — ABNORMAL HIGH (ref 0–200)
HDL: 30 mg/dL — ABNORMAL LOW (ref >39)
LDL Cholesterol: 182 mg/dL — ABNORMAL HIGH (ref 0–99)
TRIGLYCERIDES: 133 mg/dL (ref <150)
VLDL: 27 mg/dL (ref 0–40)

## 2014-12-25 LAB — BASIC METABOLIC PANEL
BUN: 8 mg/dL (ref 6–23)
CALCIUM: 9.2 mg/dL (ref 8.4–10.5)
CO2: 26 mEq/L (ref 19–32)
CREATININE: 0.68 mg/dL (ref 0.50–1.10)
Chloride: 108 mEq/L (ref 96–112)
Glucose, Bld: 130 mg/dL — ABNORMAL HIGH (ref 70–99)
Potassium: 4.4 mEq/L (ref 3.5–5.3)
Sodium: 142 mEq/L (ref 135–145)

## 2014-12-25 LAB — POCT URINE PREGNANCY: Preg Test, Ur: NEGATIVE

## 2014-12-25 NOTE — Assessment & Plan Note (Signed)
Etiology unclear. BP looks ok. ?? Hypoglycemia. I recommended frequent CBG check. R/O anemia and pregnancy. Upreg done and was negative. CBC ordered I will call her with result. F/U soon if symptoms worsens.

## 2014-12-25 NOTE — Patient Instructions (Signed)
Ingrown Toenail An ingrown toenail occurs when the sharp edge of a toenail grows into the skin of the groove beside the nail (lateral nail groove), causing pain. Ingrown toenails most commonly occur on the first (big) toe. If left untreated, an ingrown toenail may become inflamed and infected. The infection can even spread throughout the toe. SYMPTOMS   Pain, centered on the nail groove.  Tenderness and inflammation.  Pus drainage (occasionally).  Signs of infection: redness, swelling, pain, warm to the touch. CAUSES  Ingrown toenails are caused when the toenail grows into the neighboring fleshy nail fold. This may be the result of poor nail trimming or pressure from shoes.  RISK INCREASES WITH:   Curved nail formations.  Clipping toenails too far on the sides, which allows tissue to grow over the nail.  Poorly fitted shoes, especially those that press down on the toenails.  Sports that require sudden stops, causing the toe to jam into the shoe. PREVENTION   Trim toenails properly, making sure the sides are not clipped too short.  Wear properly fitted shoes.  Avoid excessive pressure on the toenails. PROGNOSIS  Ingrown toenails are usually curable within 1 week, depending on the presence or absence of an infection.  RELATED COMPLICATIONS   Chronic infection, that cannot be cured without surgery.  Spread of infection throughout the toe, or even the bone. TREATMENT  Treatment first involves resting from aggravating activities, wearing shoes that do not place pressure on the toenails, antibiotics (if infected), and soaking the foot in warm water (with or without Epsom salt). After the foot has been soaked, you may be able to gently lift the nail from the fleshy tissue, and place a bit of cotton ball under the nail, easing the pressure. If you have been prescribed antibiotics, expect relief to begin up to 48 hours after taking the medicine. Symptoms usually go away within 1 week. For  people who suffer from persistent (chronic) ingrown toenails, surgery may be advised. MEDICATION   If pain medicine is needed, nonsteroidal anti-inflammatory medicines (aspirin and ibuprofen), or other minor pain relievers (acetaminophen), are often advised.  Do not take pain medicine for 7 days before surgery.  Stronger pain relievers may be prescribed, if your caregiver thinks they are needed. Use only as directed and only as much as you need.  Antibiotics may be prescribed to fight infection. Take as directed by your caregiver. Finish the entire prescription, even if you start to feel better before you finish it. SOAKS AND COLD THERAPY   Soak the toe (or whole foot) for 20 minutes, twice a day, in a gallon of warm water. You may add 2 tablespoons of Epsom salts or a mild detergent.  Cold treatment (icing) should be applied for 10 to 15 minutes every 2 to 3 hours for inflammation and pain, and immediately after activity that aggravates your symptoms. Use ice packs or an ice massage. SEEK MEDICAL CARE IF:   Symptoms get worse or do not improve in 48 hours, despite treatment.  You develop fever, increased pain and swelling, or signs of infection (pain, redness, tenderness, swelling, warmth) in the toe, after surgery.  New, unexplained symptoms develop. (Drugs used in treatment may produce side effects.) Document Released: 10/30/2005 Document Revised: 01/22/2012 Document Reviewed: 02/11/2009 [facility] Patient Information 2015 [place], [place]. This information is not intended to replace advice given to you by your health care provider. Make sure you discuss any questions you have with your health care provider.

## 2014-12-25 NOTE — Progress Notes (Signed)
Subjective:     Patient ID: Tanya Nolan, female   DOB: 1976/09/16, 39 y.o.   MRN: [id number]  HPI  Toe ingrown:C/O worsening toe pain around her nail of both big toes worsening over tha last 2 wks. This is painful, no redness, no swelling. It bothers her since she has DM,at times she has pus from the left toe nail area. Dizziness:C/O occasional dizziness on and off for weeks, she denies fall, no LOC, denies headache, no dizziness, no weakness. DM2: She stated this is managed by her endocrinologist who regularly checks her A1c.  Current Outpatient Prescriptions on File Prior to Visit  Medication Sig Dispense Refill  . AmLODIPine Besylate (NORVASC PO) Take by mouth.    . insulin glargine (LANTUS) 100 UNIT/ML injection Inject 75 Units into the skin 2 (two) times daily. 50 units in AM and 75 units PM    . Insulin Lispro, Human, (HUMALOG KWIKPEN [place]) Inject 20 Units into the skin 3 (three) times daily before meals.     Marland Kitchen levothyroxine (SYNTHROID, LEVOTHROID) 125 MCG tablet Take 125 mcg by mouth daily before breakfast.    . metFORMIN (GLUCOPHAGE) 500 MG tablet Take 500 mg by mouth daily before lunch.     . BD ULTRA-FINE PEN NEEDLES 29G X 12.7MM MISC USE THREE TIMES A DAY BEFORE MEALS 1 each 2  . cyclobenzaprine (FLEXERIL) 10 MG tablet Take 1 tablet (10 mg total) by mouth 3 (three) times daily as needed for muscle spasms. (Patient not taking: Reported on 12/25/2014) 30 tablet 0  . enalapril (VASOTEC) 10 MG tablet Take 10 mg by mouth daily.    Marland Kitchen ibuprofen (ADVIL,MOTRIN) 800 MG tablet Take 1 tablet (800 mg total) by mouth every 8 (eight) hours as needed. (Patient not taking: Reported on 12/25/2014) 30 tablet 0  . [DISCONTINUED] insulin lispro protamine-insulin lispro (HUMALOG 75/25) (75-25) 100 UNIT/ML SUSP Inject 40-50 Units into the skin 2 (two) times daily with a meal. Patient takes 40 units in the morning and 50 units at night.  Verified it is Humalog 75/25.     No current facility-administered  medications on file prior to visit.   Past Medical History  Diagnosis Date  . 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 Diabetes mellitus   . Hypertension   . Hypercholesteremia   . Thyroid dysfunction   . Hypothyroidism   . Hyperlipidemia     no meds - tx with diet  . Chronic back pain     tx with ibuprofen  . Anxiety     Review of Systems  Respiratory: Negative.   Cardiovascular: Negative.   Gastrointestinal: Negative.   Skin:       Toe nail pain.  Neurological: Positive for light-headedness. Negative for syncope and headaches.  All other systems reviewed and are negative.      Filed Vitals:   12/25/14 1108  BP: 132/88  Pulse: 71  Temp: 98.6 F (37 C)  TempSrc: Oral  Weight: 270 lb (122.471 kg)    Objective:   Physical Exam  Constitutional: She appears well-developed. No distress.  Cardiovascular: Normal rate, regular rhythm, normal heart sounds and intact distal pulses.   No murmur heard. Pulmonary/Chest: Effort normal and breath sounds normal. No respiratory distress. She has no wheezes. She exhibits no tenderness.  Abdominal: Soft. Bowel sounds are normal. She exhibits no distension and no  mass. There is no tenderness.  Musculoskeletal: Normal range of motion. She exhibits no edema.  Sensory exam of the foot is normal, tested with the monofilament. Good pulses, no lesions or ulcers, good peripheral pulses.   + ingrown nail on both big toes,no erythema, no lesion around the toe nail bed.  Nursing note and vitals reviewed.      Assessment:     B/L Ingrown toe nail Dizziness DM2     Plan:     Check problem list.

## 2014-12-25 NOTE — Assessment & Plan Note (Signed)
Moderate to severe. No lesion or ulceration currently. She will benefit from toe nail removal. Due to risk for DM foot and ingrown nail affecting both of her first digit I will recommend podiatry assessment for nail removal. I referred patient.

## 2014-12-29 ENCOUNTER — Telehealth: Payer: Self-pay | Admitting: Family Medicine

## 2014-12-29 MED ORDER — ATORVASTATIN CALCIUM 40 MG PO TABS: 40.0000 mg | ORAL_TABLET | Freq: Every day | ORAL | Status: DC

## 2014-12-29 NOTE — Telephone Encounter (Signed)
Message left for patient to call back about test result:   Result: Abnormal fasting lipid profile.             10 yr ASCD risk of 19.3%.             Need to be on high intensity statin.  I e-prescribed her medication. Will advise to pick up when she calls back.

## 2014-12-29 NOTE — Telephone Encounter (Signed)
Pt called to get her test results. jw

## 2014-12-29 NOTE — Telephone Encounter (Signed)
Pt is aware of results and message from MD. Will go to pharmacy to pick up this medication. Jazmin Hartsell,CMA

## 2015-01-11 ENCOUNTER — Ambulatory Visit (INDEPENDENT_AMBULATORY_CARE_PROVIDER_SITE_OTHER): Payer: Managed Care, Other (non HMO) | Admitting: Podiatry

## 2015-01-11 ENCOUNTER — Encounter: Payer: Self-pay | Admitting: Podiatry

## 2015-01-11 VITALS — BP 152/100 | HR 72 | Resp 15 | Ht 66.5 in | Wt 270.0 lb

## 2015-01-11 DIAGNOSIS — R0989 Other specified symptoms and signs involving the circulatory and respiratory systems: Secondary | ICD-10-CM | POA: Diagnosis not present

## 2015-01-11 DIAGNOSIS — L600 Ingrowing nail: Secondary | ICD-10-CM

## 2015-01-11 NOTE — Patient Instructions (Signed)
Our office will contact you upon receipt of vascular examination and notify you of the results Diabetes and Foot Care Diabetes may cause you to have problems because of poor blood supply (circulation) to your feet and legs. This may cause the skin on your feet to become thinner, break easier, and heal more slowly. Your skin may become dry, and the skin may peel and crack. You may also have nerve damage in your legs and feet causing decreased feeling in them. You may not notice minor injuries to your feet that could lead to infections or more serious problems. Taking care of your feet is one of the most important things you can do for yourself.  HOME CARE INSTRUCTIONS  Wear shoes at all times, even in the house. Do not go barefoot. Bare feet are easily injured.  Check your feet daily for blisters, cuts, and redness. If you cannot see the bottom of your feet, use a mirror or ask someone for help.  Wash your feet with warm water (do not use hot water) and mild soap. Then pat your feet and the areas between your toes until they are completely dry. Do not soak your feet as this can dry your skin.  Apply a moisturizing lotion or petroleum jelly (that does not contain alcohol and is unscented) to the skin on your feet and to dry, brittle toenails. Do not apply lotion between your toes.  Trim your toenails straight across. Do not dig under them or around the cuticle. File the edges of your nails with an emery board or nail file.  Do not cut corns or calluses or try to remove them with medicine.  Wear clean socks or stockings every day. Make sure they are not too tight. Do not wear knee-high stockings since they may decrease blood flow to your legs.  Wear shoes that fit properly and have enough cushioning. To break in new shoes, wear them for just a few hours a day. This prevents you from injuring your feet. Always look in your shoes before you put them on to be sure there are no objects inside.  Do not  cross your legs. This may decrease the blood flow to your feet.  If you find a minor scrape, cut, or break in the skin on your feet, keep it and the skin around it clean and dry. These areas may be cleansed with mild soap and water. Do not cleanse the area with peroxide, alcohol, or iodine.  When you remove an adhesive bandage, be sure not to damage the skin around it.  If you have a wound, look at it several times a day to make sure it is healing.  Do not use heating pads or hot water bottles. They may burn your skin. If you have lost feeling in your feet or legs, you may not know it is happening until it is too late.  Make sure your health care provider performs a complete foot exam at least annually or more often if you have foot problems. Report any cuts, sores, or bruises to your health care provider immediately. SEEK MEDICAL CARE IF:   You have an injury that is not healing.  You have cuts or breaks in the skin.  You have an ingrown nail.  You notice redness on your legs or feet.  You feel burning or tingling in your legs or feet.  You have pain or cramps in your legs and feet.  Your legs or feet are numb.  Your feet always feel cold. SEEK IMMEDIATE MEDICAL CARE IF:   There is increasing redness, swelling, or pain in or around a wound.  There is a red line that goes up your leg.  Pus is coming from a wound.  You develop a fever or as directed by your health care provider.  You notice a bad smell coming from an ulcer or wound. Document Released: 10/27/2000 Document Revised: 07/02/2013 Document Reviewed: 04/08/2013 [facility] Patient Information 2015 [place], [place]. This information is not intended to replace advice given to you by your health care provider. Make sure you discuss any questions you have with your health care provider.

## 2015-01-11 NOTE — Progress Notes (Signed)
   Subjective:    Patient ID: Tanya Nolan, female    DOB: 06-10-1976, 39 y.o.   MRN: [id number]  HPI Comments: N ingrown toenails L B/L 1st toenails D 4 months O on and off C encurvated, and painful A diabetic pt and difficult to cut T home surgery  Patient describes some recent history of inflammation around the hallux nails which resolved with home treatment and trimming. She denies any history of professional treatment or antibiotic therapy.  She denies a history of skin ulcers or claudication.  She describes a recent A1c as 13, however, not able to localize this and are chart review      Review of Systems  All other systems reviewed and are negative.      Objective:   Physical Exam  Orientated 3  Vascular: DP pulses 2/4 bilaterally PT pulse right 2/4 PT pulse left 0/4 Capillary refill immediate bilaterally  Neurological: Sensation to 10 g monofilament wire intact 5/5 bilaterally Vibratory sensation intact bilaterally Ankle reflex equal and reactive bilaterally  Dermatological: The medial borders of the hallux toenails are incurvated without any erythema, edema, drainage Lateral margin the right and left hallux nails are minimally incurvated without any erythema, edema or drainage  Musculoskeletal: No deformities noted      Assessment & Plan:   Assessment: Ingrowing medial lateral borders of right hallux nails without clinical sign of infection Nonpalpable posterior tibial pulse left Apparent poorly controlled diabetes  Plan: There is no clinical sign of infection in the hallux toenails. Today I debrided the medial borders of the hallux toenails for prophylaxis without any bleeding  Refer patient to the vascular lab for lower extremity arterial Doppler with TBI for the indication of nonpalpable left posterior tibial pulse and possible surgical removal of ingrowing hallux toenails  Also, patient has a pending evaluation with primary care physician  in April 2016. Will defer in any definitive treatment for hallux ingrowing toenails until vascular status is fairly evaluated and diabetes control is improved  Notify patient upon receipt of arterial Doppler results

## 2015-01-13 ENCOUNTER — Telehealth (HOSPITAL_COMMUNITY): Payer: Self-pay | Admitting: *Deleted

## 2015-02-02 ENCOUNTER — Ambulatory Visit (HOSPITAL_COMMUNITY)
Admission: RE | Admit: 2015-02-02 | Discharge: 2015-02-02 | Disposition: A | Discharge status: Home / Self Care | Payer: Managed Care, Other (non HMO) | Source: Ambulatory Visit | Attending: Cardiovascular Disease | Admitting: Cardiovascular Disease

## 2015-02-02 DIAGNOSIS — R0989 Other specified symptoms and signs involving the circulatory and respiratory systems: Secondary | ICD-10-CM | POA: Insufficient documentation

## 2015-02-02 NOTE — Progress Notes (Signed)
Lower Extremity Arterial Duplex Completed. °Brianna L Mazza,RVT °

## 2015-03-02 ENCOUNTER — Telehealth: Payer: Self-pay | Admitting: *Deleted

## 2015-03-02 NOTE — Telephone Encounter (Signed)
-----   Message from Gean Birchwood, DPM sent at 02/08/2015  8:08 AM EDT ----- Results of lower extremity arterial Doppler 02/05/2015 are within normal limits Patient has pending appointment April to evaluate elevated blood glucose Notify patient that vascular lab was within normal limits I am deferring any surgical treatment for noninfected ingrown toenails until patient has better control of elevated blood glucose

## 2015-03-02 NOTE — Telephone Encounter (Signed)
I called and informed patient that Dr. Amalia Hailey said doppler study was normal.  He stated all he is waiting on now is for you to get your glucose level under control before doing ingrown toenail procedure.  "Okay, thank you."

## 2015-03-09 ENCOUNTER — Encounter: Payer: Self-pay | Admitting: Family Medicine

## 2015-06-15 ENCOUNTER — Other Ambulatory Visit: Payer: Self-pay | Admitting: Family Medicine

## 2015-06-15 MED ORDER — ATORVASTATIN CALCIUM 40 MG PO TABS: 40.0000 mg | ORAL_TABLET | Freq: Every day | ORAL | Status: DC

## 2015-06-15 NOTE — Telephone Encounter (Signed)
Needs refill on atorvastatin (LIPITOR)  For a 90 day supply called into [facility] at [phone]. Thank you, [name], ASA

## 2015-07-01 ENCOUNTER — Ambulatory Visit (INDEPENDENT_AMBULATORY_CARE_PROVIDER_SITE_OTHER): Payer: Managed Care, Other (non HMO) | Admitting: Gynecology

## 2015-07-01 ENCOUNTER — Encounter: Payer: Self-pay | Admitting: Gynecology

## 2015-07-01 VITALS — BP 124/80 | Ht 68.0 in | Wt 268.0 lb

## 2015-07-01 DIAGNOSIS — Z01419 Encounter for gynecological examination (general) (routine) without abnormal findings: Secondary | ICD-10-CM

## 2015-07-01 NOTE — Progress Notes (Signed)
Tanya Nolan [birth year] [id number]        39 y.o.  G0P0 for annual exam.  Doing well.  Past medical history,surgical history, problem list, medications, allergies, family history and social history were all reviewed and documented as reviewed in the EPIC chart.  ROS:  Performed with pertinent positives and negatives included in the history, assessment and plan.   Additional significant findings :  none   Exam: Kim Counsellor Vitals:   07/01/15 1155  BP: 124/80  Height: 5\' 8"  (1.727 m)  Weight: 268 lb (121.564 kg)   General appearance:  Normal affect, orientation and appearance. Skin: Grossly normal HEENT: Without gross lesions.  No cervical or supraclavicular adenopathy. Thyroid normal.  Lungs:  Clear without wheezing, rales or rhonchi Cardiac: RR, without RMG Abdominal:  Soft, nontender, without masses, guarding, rebound, organomegaly or hernia Breasts:  Examined lying and sitting without masses, retractions, discharge or axillary adenopathy. Pelvic:  Ext/BUS/vagina normal  Cervix normal  Uterus anteverted, normal size, shape and contour, midline and mobile nontender   Adnexa  Without masses or tenderness    Anus and perineum  Normal   Rectovaginal  Normal sphincter tone without palpated masses or tenderness.    Assessment/Plan:  39 y.o. G0P0 female for annual exam with regular menses, no contraception.   1. No contraception. Patient not using contraception. Would accept pregnancy if it occurred. I again reviewed the issues of pregnancy with her medical issues to include hypertension, diabetes and hypercholesterolemia. Pregnancy was advancing maternal age and increased risk for chromosomal abnormalities.  She also is on enalapril and the pregnancy risk category D with associated birth defects adverse pregnancy effects discussed. Need to discuss and switch to a pregnancy friendly antihypertensive with her primary before pregnancy. Need to switch immediately if becomes  pregnant reviewed.  Recommended multivitamin with folic acid starting now. 2. Pap smear/HPV negative 2015. No Pap smear done today.  History LGSIL 2008 with normal Pap smears afterwards. 3. Breast health. SBE monthly reviewed. Plan mammogram at age 39. 54. Health maintenance. No routine lab work done as this is done through her primary physician's office. Follow up 1 year, sooner as needed.   Anastasio Auerbach MD, 12:18 PM 07/01/2015

## 2015-07-01 NOTE — Patient Instructions (Signed)

## 2015-07-02 LAB — URINALYSIS W MICROSCOPIC + REFLEX CULTURE
BACTERIA UA: NONE SEEN [HPF]
Bilirubin Urine: NEGATIVE
CRYSTALS: NONE SEEN [HPF]
Casts: NONE SEEN [LPF]
Hgb urine dipstick: NEGATIVE
KETONES UR: NEGATIVE
Leukocytes, UA: NEGATIVE
Nitrite: NEGATIVE
PROTEIN: NEGATIVE
Specific Gravity, Urine: 1.029 (ref 1.001–1.035)
WBC, UA: NONE SEEN WBC/HPF (ref <=5)
Yeast: NONE SEEN [HPF]
pH: 5.5 (ref 5.0–8.0)

## 2015-07-03 LAB — URINE CULTURE

## 2015-08-23 ENCOUNTER — Encounter: Payer: Self-pay | Admitting: Family Medicine

## 2015-08-23 ENCOUNTER — Ambulatory Visit (INDEPENDENT_AMBULATORY_CARE_PROVIDER_SITE_OTHER): Payer: Managed Care, Other (non HMO) | Admitting: Family Medicine

## 2015-08-23 VITALS — BP 154/69 | HR 86 | Temp 97.8°F | Wt 268.3 lb

## 2015-08-23 DIAGNOSIS — R300 Dysuria: Secondary | ICD-10-CM | POA: Diagnosis not present

## 2015-08-23 DIAGNOSIS — Z23 Encounter for immunization: Secondary | ICD-10-CM | POA: Diagnosis not present

## 2015-08-23 LAB — POCT UA - MICROSCOPIC ONLY

## 2015-08-23 LAB — POCT URINALYSIS DIPSTICK
BILIRUBIN UA: NEGATIVE
Leukocytes, UA: NEGATIVE
NITRITE UA: NEGATIVE
Protein, UA: NEGATIVE
Spec Grav, UA: >=1.03
Urobilinogen, UA: 0.2
pH, UA: 6

## 2015-08-23 MED ORDER — CEPHALEXIN 500 MG PO CAPS: 500.0000 mg | ORAL_CAPSULE | Freq: Two times a day (BID) | ORAL | Status: DC

## 2015-08-23 NOTE — Progress Notes (Signed)
    Subjective   Tanya Nolan is a 39 y.o. female that presents for a same day visit  1. Dysuria: Symptoms started 5 days ago with associated hesitancy and frequency. Patient reports history of UTI in the past. No fevers but reports one episode of emesis two days ago. No vaginal discharge.  ROS Per HPI  Social History  Substance Use Topics  . Smoking status: Former Smoker -- 0.50 packs/day    Types: Cigarettes  . Smokeless tobacco: Never Used  . Alcohol Use: 0.0 oz/week    0 Standard drinks or equivalent per week     Comment: socially - wine/liquor--Rare    No Known Allergies  Objective   BP 154/69 mmHg  Pulse 86  Temp(Src) 97.8 F (36.6 C) (Oral)  Wt 268 lb 4.8 oz (121.7 kg)  LMP 08/04/2015 (Exact Date)  General: Well appearing Genitourinary: Suprapubic tenderness  Assessment and Plan   Meds ordered this encounter  Medications  . cephALEXin (KEFLEX) 500 MG capsule    Sig: Take 1 capsule (500 mg total) by mouth 2 (two) times daily.    Dispense:  14 capsule    Refill:  0    Dysuria: possibly related to a UTI. Patient's urinalysis not very suggestive and also a very dirty catch. With symptoms, will treat. - Keflex 500mg  BID x7 days - Return precautions - Follow-up with PCP in next week for management of Diabetes (Last A1C of 10 with poor follow-up)

## 2015-08-23 NOTE — Patient Instructions (Addendum)
Thank you for coming to see me today. It was a pleasure. Today we talked about:   Dysuria (painful urination): this may be related to a urinary tract infection. I will treat you with antibiotics for 7 days. Please return if you develop worsening symptoms, fevers, continued nausea/vomiting.  Please make an appointment to see Dr. Gwendlyn Deutscher for an office visit in the next week to manage your Diabetes.  If you have any questions or concerns, please do not hesitate to call the office at [phone].  Sincerely,  Cordelia Poche, MD  Urinary Tract Infection Urinary tract infections (UTIs) can develop anywhere along your urinary tract. Your urinary tract is your body's drainage system for removing wastes and extra water. Your urinary tract includes two kidneys, two ureters, a bladder, and a urethra. Your kidneys are a pair of bean-shaped organs. Each kidney is about the size of your fist. They are located below your ribs, one on each side of your spine. CAUSES Infections are caused by microbes, which are microscopic organisms, including fungi, viruses, and bacteria. These organisms are so small that they can only be seen through a microscope. Bacteria are the microbes that most commonly cause UTIs. SYMPTOMS  Symptoms of UTIs may vary by age and gender of the patient and by the location of the infection. Symptoms in young women typically include a frequent and intense urge to urinate and a painful, burning feeling in the bladder or urethra during urination. Older women and men are more likely to be tired, shaky, and weak and have muscle aches and abdominal pain. A fever may mean the infection is in your kidneys. Other symptoms of a kidney infection include pain in your back or sides below the ribs, nausea, and vomiting. DIAGNOSIS To diagnose a UTI, your caregiver will ask you about your symptoms. Your caregiver will also ask you to provide a urine sample. The urine sample will be tested for bacteria and  white blood cells. White blood cells are made by your body to help fight infection. TREATMENT  Typically, UTIs can be treated with medication. Because most UTIs are caused by a bacterial infection, they usually can be treated with the use of antibiotics. The choice of antibiotic and length of treatment depend on your symptoms and the type of bacteria causing your infection. HOME CARE INSTRUCTIONS  If you were prescribed antibiotics, take them exactly as your caregiver instructs you. Finish the medication even if you feel better after you have only taken some of the medication.  Drink enough water and fluids to keep your urine clear or pale yellow.  Avoid caffeine, tea, and carbonated beverages. They tend to irritate your bladder.  Empty your bladder often. Avoid holding urine for long periods of time.  Empty your bladder before and after sexual intercourse.  After a bowel movement, women should cleanse from front to back. Use each tissue only once. SEEK MEDICAL CARE IF:   You have back pain.  You develop a fever.  Your symptoms do not begin to resolve within 3 days. SEEK IMMEDIATE MEDICAL CARE IF:   You have severe back pain or lower abdominal pain.  You develop chills.  You have nausea or vomiting.  You have continued burning or discomfort with urination. MAKE SURE YOU:   Understand these instructions.  Will watch your condition.  Will get help right away if you are not doing well or get worse.   This information is not intended to replace advice given to you by  your health care provider. Make sure you discuss any questions you have with your health care provider.   Document Released: 08/09/2005 Document Revised: 07/21/2015 Document Reviewed: 12/08/2011 Elsevier Interactive Patient Education [employer].

## 2016-02-23 ENCOUNTER — Encounter: Payer: Self-pay | Admitting: Family Medicine

## 2016-02-23 ENCOUNTER — Ambulatory Visit (INDEPENDENT_AMBULATORY_CARE_PROVIDER_SITE_OTHER): Payer: Managed Care, Other (non HMO) | Admitting: Family Medicine

## 2016-02-23 VITALS — Wt 259.0 lb

## 2016-02-23 DIAGNOSIS — R21 Rash and other nonspecific skin eruption: Secondary | ICD-10-CM

## 2016-02-23 DIAGNOSIS — R059 Cough, unspecified: Secondary | ICD-10-CM

## 2016-02-23 DIAGNOSIS — R05 Cough: Secondary | ICD-10-CM

## 2016-02-23 DIAGNOSIS — I10 Essential (primary) hypertension: Secondary | ICD-10-CM | POA: Diagnosis not present

## 2016-02-23 MED ORDER — BENZONATATE 100 MG PO CAPS: 100.0000 mg | ORAL_CAPSULE | Freq: Two times a day (BID) | ORAL | Status: DC | PRN

## 2016-02-23 MED ORDER — TRIAMCINOLONE ACETONIDE 0.1 % EX CREA: 1.0000 "application " | TOPICAL_CREAM | Freq: Two times a day (BID) | CUTANEOUS | Status: DC

## 2016-02-23 NOTE — Assessment & Plan Note (Signed)
Not controlled.  Has not take her blood pressure medications in several days.  Discussed - See after visit summary

## 2016-02-23 NOTE — Patient Instructions (Addendum)
Good to see you today!  Thanks for coming in.  For the cough  Stop smoking  Use tessalon as needed  Should be better in 3-4 weeks  If fever or thick yellow sputum or shortness of breath then come back  For the rash  This is a viral rash like pitiryrasis rosea  If this is not gone in 1-2 weeks or if getting a lot worse or if you have fever or blisters or sores in your mouth then come back  Your blood pressure way to high  Take your blood pressure medication every day   Alarm on your phone

## 2016-02-23 NOTE — Progress Notes (Signed)
   Subjective:    Patient ID: Tanya Nolan, female    DOB: 03-01-1976, 40 y.o.   MRN: [id number]  HPI  COUGH  Has been coughing for 2-3 weeks. Cough is: dry hacking in spasm Sputum production: no Medications tried: otc robitussion Taking blood pressure medications: no  Symptoms Runny nose: no Mucous in back of throat: no Throat burning or reflux: no Wheezing or asthma: no Fever: no Chest Pain: no Shortness of breath: when has a coughing attack Leg swelling: no Hemoptysis: no Weight loss: no  ROS see HPI Smoking Status noted RASH  Had rash for 4 days. Location: back arms chest Medications tried: otc cortisone Similar rash in past: no New medications or antibiotics: no Tick, Insect or new pet exposure: no Recent travel: no New detergent or soap: no Immunocompromised: no  Symptoms Itching: yes Pain over rash: no Feeling ill all over: no Fever: no Mouth sores: no Face or tongue swelling: no Trouble breathing: no Joint swelling or pain: no  Review of Symptoms - see HPI PMH - Smoking status noted.    Chief Complaint noted Review of Symptoms - see HPI PMH - Smoking status noted.   Vital Signs reviewed   Review of Systems     Objective:   Physical Exam NAD Lungs - clear without wheeze or dullness but has spastic sounding cough with deep inspiration Heart - Regular rate and rhythm.  No murmurs, gallops or rubs.    Skin - fine slightly red bumps on extensor surfaces, back and chest Mouth - no lesions, mucous membranes are moist, no decaying teeth        Assessment & Plan:   Cough - most consistent with post viral bronchitis worsened by smoking -s ee after visit summary  Rash - viral perhaps pityrasis.  No red flags for contact or drug.  See after visit summary

## 2016-03-10 ENCOUNTER — Encounter: Payer: Self-pay | Admitting: Family Medicine

## 2016-03-10 ENCOUNTER — Ambulatory Visit (INDEPENDENT_AMBULATORY_CARE_PROVIDER_SITE_OTHER): Payer: Managed Care, Other (non HMO) | Admitting: Family Medicine

## 2016-03-10 VITALS — BP 122/82 | HR 120 | Temp 98.4°F | Ht 68.0 in | Wt 254.0 lb

## 2016-03-10 DIAGNOSIS — R059 Cough, unspecified: Secondary | ICD-10-CM

## 2016-03-10 DIAGNOSIS — R300 Dysuria: Secondary | ICD-10-CM | POA: Diagnosis not present

## 2016-03-10 DIAGNOSIS — R05 Cough: Secondary | ICD-10-CM

## 2016-03-10 DIAGNOSIS — M545 Low back pain, unspecified: Secondary | ICD-10-CM

## 2016-03-10 LAB — POCT UA - MICROSCOPIC ONLY

## 2016-03-10 LAB — POCT URINALYSIS DIPSTICK
BILIRUBIN UA: NEGATIVE
Leukocytes, UA: NEGATIVE
NITRITE UA: NEGATIVE
PROTEIN UA: NEGATIVE
Spec Grav, UA: <=1.005
Urobilinogen, UA: 0.2
pH, UA: 5.5

## 2016-03-10 LAB — POCT URINE PREGNANCY: PREG TEST UR: NEGATIVE

## 2016-03-10 MED ORDER — OMEPRAZOLE 20 MG PO CPDR: 20.0000 mg | DELAYED_RELEASE_CAPSULE | Freq: Every day | ORAL | Status: DC

## 2016-03-10 MED ORDER — CYCLOBENZAPRINE HCL 10 MG PO TABS: 5.0000 mg | ORAL_TABLET | Freq: Three times a day (TID) | ORAL | Status: DC | PRN

## 2016-03-10 MED ORDER — ALBUTEROL SULFATE HFA 108 (90 BASE) MCG/ACT IN AERS: 2.0000 | INHALATION_SPRAY | Freq: Four times a day (QID) | RESPIRATORY_TRACT | Status: DC | PRN

## 2016-03-10 NOTE — Progress Notes (Signed)
Date of Visit: 03/10/2016   HPI:  Patient presents for a same day appointment to discuss:  - back pain - present in low back. Has history of chronic back pain. Had back surgery in 2009 for a compressed disc/sciatica. Pain in back present for about 2-3 weeks. This is a typical flareup of chronic back pain for her. No fevers. No saddle anesthesia. can feel on perineum while wiping. No lower extremity weakness. Does note that she has started urinating on herself when she coughs, just "tinkles" some out. Prior to that doesn't feel like she has to urinate. She is able to control her bladder by initiating and stopping voiding at other times. Denies chance of pregnancy. lmp last month. Sexually active with a female partner. Does not use birth control. Eating and drinking well. stooling normally.  - cough - has had cough for 3 weeks. Seen 2 weeks ago and dx'd with likely postviral cough. No fevers. Occasionally produces clear sputum. Tried tessalon which makes her sleepy but di dhelp some with cough. No congestion. Mild runny nose. Does not hear herself wheeze. Does not take her chronic medications consistently. Is supposed to be on enalapril but reports not taking this for a while recently, and cough persisted despite being off this medicine. Resumed it 1 week ago.  ROS: See HPI  [place]: history of hypothyroidism, type 2 diabetes, hyperlipidemia, PCOS, hypertension  PHYSICAL EXAM: BP 122/82 mmHg  Pulse 120  Temp(Src) 98.4 F (36.9 C) (Oral)  Ht 5\' 8"  (1.727 m)  Wt 254 lb (115.214 kg)  BMI 38.63 kg/m2  SpO2 99%  LMP 02/07/2016 (Approximate) Gen: no acute distress, pleasant, cooperative HEENT: normocephalic, atraumatic. Moist mucous membranes  Heart: regular rate and rhythm, no murmur Lungs: clear to auscultation bilaterally, normal work of breathing. Frequent severe cough. Abdomen: soft nontender to palpation. No rebound tenderness. No masses or organomegaly Neuro: alert, grossly nonfocal, speech  normal Extremities: full strength bilateral lower extremities. Sensation intact over bilateral extremities. Back: low back musculature tender to palpation. No bony tenderness.  ASSESSMENT/PLAN:  1. Back pain - typical flare of her musculoskeletal back pain. rx flexeril. Recommend heating pad. UA without signs of infection but will send culture due to urinary sx. No red flags. Having some incontinence of urine but this seems clearly stress incontinence. Otherwise able to control urine. No indication for urgent or emergent imaging. Discussed red flags which should prompt evaluation in ED.  2. Urinary symptoms - see above. Send urine for culture. Discussed red flags.  3. Cough - differential diagnosis includes acid reflux, allergies, ACE inhibitor cough, postviral. Respiratory exam without signfiicant findings today other than frequent cough. At this time favor acid reflux. Less likely ACE inhibitor related as it did not improve when she stopped enalapril. Patient endorses history of GERD in the past and reports she is "supposed to be on" acid reflux medicine. Will start prilosec. Also rx albuterol inhaler to see if this relieves symptoms. Check CXR. Follow up with PCP in 1-2 weeks. If not improved at that time would recommend trial of aggressive treatment for allergic rhinitis. Patient agreeable to this plan.  FOLLOW UP: Follow up in 1-2 weeks with PCP for above issues.  [place]. Ardelia Mems, [place]

## 2016-03-10 NOTE — Patient Instructions (Signed)
For cough: - go get chest xray - start prilosec to treat for acid reflux. Sent this in for you. - use albuterol inhaler 2 puffs every 4 hours as needed, only if it helps  For back pain: - flexeril sent in - heating pad - return ASAP or go to ER if you have any weakness in legs, fevers, inability to control urine or bowels, or numbness in genital area  For urine: - no signs of infection today - checking urine culture - I'll call you if we need to start antibiotics - if you cannot control your urine please come in here or go to ED.  Follow up with Dr. Gwendlyn Deutscher in 1-2 weeks for these symptoms. Sooner if anything is getting worse.  Be well, Dr. Ardelia Mems

## 2016-03-11 LAB — URINE CULTURE

## 2016-06-14 ENCOUNTER — Ambulatory Visit (INDEPENDENT_AMBULATORY_CARE_PROVIDER_SITE_OTHER): Payer: Managed Care, Other (non HMO) | Admitting: Internal Medicine

## 2016-06-14 ENCOUNTER — Encounter: Payer: Self-pay | Admitting: Internal Medicine

## 2016-06-14 DIAGNOSIS — R05 Cough: Secondary | ICD-10-CM | POA: Diagnosis not present

## 2016-06-14 DIAGNOSIS — R059 Cough, unspecified: Secondary | ICD-10-CM

## 2016-06-14 MED ORDER — DM-GUAIFENESIN ER 30-600 MG PO TB12: 1.0000 | ORAL_TABLET | Freq: Two times a day (BID) | ORAL | Status: DC

## 2016-06-14 MED ORDER — DEXTROMETHORPHAN-GUAIFENESIN 10-100 MG/5ML PO LIQD: 5.0000 mL | ORAL | 0 refills | Status: DC | PRN

## 2016-06-14 MED ORDER — CETIRIZINE HCL 10 MG PO TABS: 10.0000 mg | ORAL_TABLET | Freq: Every day | ORAL | 11 refills | Status: DC

## 2016-06-14 NOTE — Assessment & Plan Note (Signed)
Cough likely viral in nature. Patient has albuterol inhaler which she uses at times with some relief. Patient with multiple factors that could be contributing to cough including history of smoking concerning for chronic bronchitis, seasonal allergies, Ace inhibitor use. However due to patient's history of congestion along with cough most likely due to viral infection  - CXR to determine if any chronic disease factors -Robitussin for cough - Zyrtec to help with possibly seasonal allergy contribution, patient does not want to take Flonase or any nasal inhaler as it makes her have nose bleeds.  -Continue albuterol as needed for cough

## 2016-06-14 NOTE — Progress Notes (Signed)
   [facility] Kerrin Mo, MD Phone: [phone]  Reason For Visit: Same Day for Cough   # Previously seen for cough in April that resolved. Patient now with a new cough starting this weekend.  Positive for nasal congestion. No sore throat. 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 Patient has used the albuterol inhaler which helps some. Tried the [employer], however this has not helped with new cough. No fever or chills. No wheezing. Slight costochrondritis when patient coughs. Currently smokes about 10 cigarettes a day.  Patient's states her problems with cough occurred once she restarted using tobacco. Patient currently taking Enapril. Patient with hx of seasonal allergies with significant nasal congestion.  Past Medical History - Previous hx of cough  Reviewed problem list.  Medications- reviewed and updated No additions to family history Social history- patient smoked for about 14 years, restarted smoking last in 2016  Objective: BP (!) 162/100 (BP Location: Right Arm, Patient Position: Sitting, Cuff Size: Large)   Pulse (!) 103   Temp 99.1 F (37.3 C) (Oral)   Wt 264 lb (119.7 kg)   LMP 06/08/2016   SpO2 100%   BMI 40.14 kg/m  Gen: NAD, alert, cooperative with exam HEENT: NCAT, EOMI, PERRL, TMs nml, boggy turbinates on exam,  Neck: FROM, supple CV: RRR, good S1/S2, no murmur Resp: CTABL, no wheezes, non-labored  Assessment/Plan: See problem based a/p  Cough Cough likely viral in nature. Patient has albuterol inhaler which she uses at times with some relief. Patient with multiple factors that could be contributing to cough including history of smoking concerning for chronic bronchitis, seasonal allergies, Ace inhibitor use. However due to patient's history of congestion along with cough most likely due to viral infection  - CXR to determine if any chronic disease factors -Robitussin for cough - Zyrtec to help with possibly  seasonal allergy contribution, patient does not want to take Flonase or any nasal inhaler as it makes her have nose bleeds.  -Continue albuterol as needed for cough

## 2016-06-14 NOTE — Patient Instructions (Signed)
Take the medications for cough. Zyrtec is to help with seasonal allergies. Chest xray to look at your lungs. Follow up in 2-3 if not better.

## 2016-07-11 ENCOUNTER — Encounter: Payer: Self-pay | Admitting: Family Medicine

## 2016-07-11 ENCOUNTER — Other Ambulatory Visit: Payer: Self-pay | Admitting: Family Medicine

## 2016-07-11 NOTE — Progress Notes (Signed)
[facility] Blue team,  Please advise patient, I have not seen her in over 1 yr. She need to follow up with me regularly for BP management. I will refill her medication one time now. Help her schedule follow up with me early Sept for BP and health management. Thanks.

## 2016-07-12 NOTE — Progress Notes (Signed)
LM for patient to call back and please schedule an appointment. Jazmin Hartsell,CMA

## 2016-07-14 ENCOUNTER — Encounter: Payer: Self-pay | Admitting: Gynecology

## 2016-07-14 ENCOUNTER — Ambulatory Visit (INDEPENDENT_AMBULATORY_CARE_PROVIDER_SITE_OTHER): Payer: Managed Care, Other (non HMO) | Admitting: Gynecology

## 2016-07-14 VITALS — BP 124/78 | Ht 68.0 in | Wt 262.0 lb

## 2016-07-14 DIAGNOSIS — Z01419 Encounter for gynecological examination (general) (routine) without abnormal findings: Secondary | ICD-10-CM | POA: Diagnosis not present

## 2016-07-14 NOTE — Progress Notes (Signed)
    ALMED[place]THCOTT 1976-06-19 [id number]        40 y.o.  G0P0  for annual exam.  Doing well without complaints.  Past medical history,surgical history, problem list, medications, allergies, family history and social history were all reviewed and documented as reviewed in the EPIC chart.  ROS:  Performed with pertinent positives and negatives included in the history, assessment and plan.   Additional significant findings :  None   Exam: Caryn Bee assistant Vitals:   07/14/16 1420  BP: 124/78  Weight: 262 lb (118.8 kg)  Height: 5\' 8"  (1.727 m)   Body mass index is 39.84 kg/m.  General appearance:  Normal affect, orientation and appearance. Skin: Grossly normal HEENT: Without gross lesions.  No cervical or supraclavicular adenopathy. Thyroid normal.  Lungs:  Clear without wheezing, rales or rhonchi Cardiac: RR, without RMG Abdominal:  Soft, nontender, without masses, guarding, rebound, organomegaly or hernia Breasts:  Examined lying and sitting without masses, retractions, discharge or axillary adenopathy. Pelvic:  Ext/BUS/Vagina normal with slight menses flow  Cervix normal  Uterus anteverted, normal size, shape and contour, midline and mobile nontender   Adnexa without masses or tenderness    Anus and perineum normal   Rectovaginal normal sphincter tone without palpated masses or tenderness.    Assessment/Plan:  40 y.o. G0P0 female for annual exam with regular menses, no contraception.   1. No contraception. I again discussed not using contraception and the risks of pregnancy. She is on medications to include a category D enalapril. Also being treated for other medical issues including diabetes. At this point she would accept pregnancy if it occurs but does not want to actively pursue getting pregnant declining referral to reproductive endocrinologist. Reviewed option to discuss with a perinatologist her medications preconceptionally and she declines. Recommended again that  she discuss with her medical doctors the possibility of switching to more pregnancy friendly medications. Also recommended prenatal vitamin daily for preconceptual vitamin supplementation. Patient will call me if she changes her mind about referrals. 2. Pap smear/HPV 2015 negative. No Pap smear done today. History of LGSIL 2008 with normal Pap smears since then. 3. Breast health. SBE monthly reviewed. Will plan mammography at 40. No strong family history of breast cancer. 4. Health maintenance. No routine lab work done as this is done elsewhere. Follow up 1 year, sooner as needed.     Anastasio Auerbach MD, 2:47 PM 07/14/2016

## 2016-07-14 NOTE — Patient Instructions (Addendum)
Discuss with your other doctors about switching your medications to a more pregnancy friendly choice.  Call me if you want referral to a physician who specializes in high risk pregnancies to discuss your medications  Continue on a prenatal vitamin now that you can buy across the counter.   You may obtain a copy of any labs that were done today by logging onto MyChart as outlined in the instructions provided with your AVS (after visit summary). The office will not call with normal lab results but certainly if there are any significant abnormalities then we will contact you.   Health Maintenance Adopting a healthy lifestyle and getting preventive care can go a long way to promote health and wellness. Talk with your health care provider about what schedule of regular examinations is right for you. This is a good chance for you to check in with your provider about disease prevention and staying healthy. In between checkups, there are plenty of things you can do on your own. Experts have done a lot of research about which lifestyle changes and preventive measures are most likely to keep you healthy. Ask your health care provider for more information. WEIGHT AND DIET  Eat a healthy diet  Be sure to include plenty of vegetables, fruits, low-fat dairy products, and lean protein.  Do not eat a lot of foods high in solid fats, added sugars, or salt.  Get regular exercise. This is one of the most important things you can do for your health.  Most adults should exercise for at least 150 minutes each week. The exercise should increase your heart rate and make you sweat (moderate-intensity exercise).  Most adults should also do strengthening exercises at least twice a week. This is in addition to the moderate-intensity exercise.  Maintain a healthy weight  Body mass index (BMI) is a measurement that can be used to identify possible weight problems. It estimates body fat based on height and weight. Your  health care provider can help determine your BMI and help you achieve or maintain a healthy weight.  For females 99 years of age and older:   A BMI below 18.5 is considered underweight.  A BMI of 18.5 to 24.9 is normal.  A BMI of 25 to 29.9 is considered overweight.  A BMI of 30 and above is considered obese.  Watch levels of cholesterol and blood lipids  You should start having your blood tested for lipids and cholesterol at 38 years of age, then have this test every 5 years.  You may need to have your cholesterol levels checked more often if:  Your lipid or cholesterol levels are high.  You are older than 38 years of age.  You are at high risk for heart disease.  CANCER SCREENING   Lung Cancer  Lung cancer screening is recommended for adults 6-40 years old who are at high risk for lung cancer because of a history of smoking.  A yearly low-dose CT scan of the lungs is recommended for people who:  Currently smoke.  Have quit within the past 15 years.  Have at least a 30-pack-year history of smoking. A pack year is smoking an average of one pack of cigarettes a day for 1 year.  Yearly screening should continue until it has been 15 years since you quit.  Yearly screening should stop if you develop a health problem that would prevent you from having lung cancer treatment.  Breast Cancer  Practice breast self-awareness. This means understanding how  your breasts normally appear and feel.  It also means doing regular breast self-exams. Let your health care provider know about any changes, no matter how small.  If you are in your 20s or 30s, you should have a clinical breast exam (CBE) by a health care provider every 1-3 years as part of a regular health exam.  If you are 78 or older, have a CBE every year. Also consider having a breast X-ray (mammogram) every year.  If you have a family history of breast cancer, talk to your health care provider about genetic  screening.  If you are at high risk for breast cancer, talk to your health care provider about having an MRI and a mammogram every year.  Breast cancer gene (BRCA) assessment is recommended for women who have family members with BRCA-related cancers. BRCA-related cancers include:  Breast.  Ovarian.  Tubal.  Peritoneal cancers.  Results of the assessment will determine the need for genetic counseling and BRCA1 and BRCA2 testing. Cervical Cancer Routine pelvic examinations to screen for cervical cancer are no longer recommended for nonpregnant women who are considered low risk for cancer of the pelvic organs (ovaries, uterus, and vagina) and who do not have symptoms. A pelvic examination may be necessary if you have symptoms including those associated with pelvic infections. Ask your health care provider if a screening pelvic exam is right for you.   The Pap test is the screening test for cervical cancer for women who are considered at risk.  If you had a hysterectomy for a problem that was not cancer or a condition that could lead to cancer, then you no longer need Pap tests.  If you are older than 65 years, and you have had normal Pap tests for the past 10 years, you no longer need to have Pap tests.  If you have had past treatment for cervical cancer or a condition that could lead to cancer, you need Pap tests and screening for cancer for at least 20 years after your treatment.  If you no longer get a Pap test, assess your risk factors if they change (such as having a new sexual partner). This can affect whether you should start being screened again.  Some women have medical problems that increase their chance of getting cervical cancer. If this is the case for you, your health care provider may recommend more frequent screening and Pap tests.  The human papillomavirus (HPV) test is another test that may be used for cervical cancer screening. The HPV test looks for the virus that can  cause cell changes in the cervix. The cells collected during the Pap test can be tested for HPV.  The HPV test can be used to screen women 4 years of age and older. Getting tested for HPV can extend the interval between normal Pap tests from three to five years.  An HPV test also should be used to screen women of any age who have unclear Pap test results.  After 38 years of age, women should have HPV testing as often as Pap tests.  Colorectal Cancer  This type of cancer can be detected and often prevented.  Routine colorectal cancer screening usually begins at 38 years of age and continues through 38 years of age.  Your health care provider may recommend screening at an earlier age if you have risk factors for colon cancer.  Your health care provider may also recommend using home test kits to check for hidden blood in the  stool.  A small camera at the end of a tube can be used to examine your colon directly (sigmoidoscopy or colonoscopy). This is done to check for the earliest forms of colorectal cancer.  Routine screening usually begins at age 4.  Direct examination of the colon should be repeated every 5-10 years through 38 years of age. However, you may need to be screened more often if early forms of precancerous polyps or small growths are found. Skin Cancer  Check your skin from head to toe regularly.  Tell your health care provider about any new moles or changes in moles, especially if there is a change in a mole's shape or color.  Also tell your health care provider if you have a mole that is larger than the size of a pencil eraser.  Always use sunscreen. Apply sunscreen liberally and repeatedly throughout the day.  Protect yourself by wearing long sleeves, pants, a wide-brimmed hat, and sunglasses whenever you are outside. HEART DISEASE, DIABETES, AND HIGH BLOOD PRESSURE   Have your blood pressure checked at least every 1-2 years. High blood pressure causes heart  disease and increases the risk of stroke.  If you are between 74 years and 44 years old, ask your health care provider if you should take aspirin to prevent strokes.  Have regular diabetes screenings. This involves taking a blood sample to check your fasting blood sugar level.  If you are at a normal weight and have a low risk for diabetes, have this test once every three years after 38 years of age.  If you are overweight and have a high risk for diabetes, consider being tested at a younger age or more often. PREVENTING INFECTION  Hepatitis B  If you have a higher risk for hepatitis B, you should be screened for this virus. You are considered at high risk for hepatitis B if:  You were born in a country where hepatitis B is common. Ask your health care provider which countries are considered high risk.  Your parents were born in a high-risk country, and you have not been immunized against hepatitis B (hepatitis B vaccine).  You have HIV or AIDS.  You use needles to inject street drugs.  You live with someone who has hepatitis B.  You have had sex with someone who has hepatitis B.  You get hemodialysis treatment.  You take certain medicines for conditions, including cancer, organ transplantation, and autoimmune conditions. Hepatitis C  Blood testing is recommended for:  Everyone born from [birth year] through 1963.  Anyone with known risk factors for hepatitis C. Sexually transmitted infections (STIs)  You should be screened for sexually transmitted infections (STIs) including gonorrhea and chlamydia if:  You are sexually active and are younger than 38 years of age.  You are older than 38 years of age and your health care provider tells you that you are at risk for this type of infection.  Your sexual activity has changed since you were last screened and you are at an increased risk for chlamydia or gonorrhea. Ask your health care provider if you are at risk.  If you do not have  HIV, but are at risk, it may be recommended that you take a prescription medicine daily to prevent HIV infection. This is called pre-exposure prophylaxis (PrEP). You are considered at risk if:  You are sexually active and do not regularly use condoms or know the HIV status of your partner(s).  You take drugs by injection.  You are  sexually active with a partner who has HIV. Talk with your health care provider about whether you are at high risk of being infected with HIV. If you choose to begin PrEP, you should first be tested for HIV. You should then be tested every 3 months for as long as you are taking PrEP.  PREGNANCY   If you are premenopausal and you may become pregnant, ask your health care provider about preconception counseling.  If you may become pregnant, take 400 to 800 micrograms (mcg) of folic acid every day.  If you want to prevent pregnancy, talk to your health care provider about birth control (contraception). OSTEOPOROSIS AND MENOPAUSE   Osteoporosis is a disease in which the bones lose minerals and strength with aging. This can result in serious bone fractures. Your risk for osteoporosis can be identified using a bone density scan.  If you are 69 years of age or older, or if you are at risk for osteoporosis and fractures, ask your health care provider if you should be screened.  Ask your health care provider whether you should take a calcium or vitamin D supplement to lower your risk for osteoporosis.  Menopause may have certain physical symptoms and risks.  Hormone replacement therapy may reduce some of these symptoms and risks. Talk to your health care provider about whether hormone replacement therapy is right for you.  HOME CARE INSTRUCTIONS   Schedule regular health, dental, and eye exams.  Stay current with your immunizations.   Do not use any tobacco products including cigarettes, chewing tobacco, or electronic cigarettes.  If you are pregnant, do not  drink alcohol.  If you are breastfeeding, limit how much and how often you drink alcohol.  Limit alcohol intake to no more than 1 drink per day for nonpregnant women. One drink equals 12 ounces of beer, 5 ounces of wine, or 1 ounces of hard liquor.  Do not use street drugs.  Do not share needles.  Ask your health care provider for help if you need support or information about quitting drugs.  Tell your health care provider if you often feel depressed.  Tell your health care provider if you have ever been abused or do not feel safe at home. Document Released: 05/15/2011 Document Revised: 03/16/2014 Document Reviewed: 10/01/2013 [facility] Patient Information 2015 [place], [place]. This information is not intended to replace advice given to you by your health care provider. Make sure you discuss any questions you have with your health care provider.

## 2016-08-04 ENCOUNTER — Encounter (HOSPITAL_COMMUNITY): Payer: Self-pay | Admitting: *Deleted

## 2016-08-04 ENCOUNTER — Ambulatory Visit (HOSPITAL_COMMUNITY)
Admission: EM | Admit: 2016-08-04 | Discharge: 2016-08-04 | Disposition: A | Discharge status: Home / Self Care | Payer: Managed Care, Other (non HMO) | Attending: Internal Medicine | Admitting: Internal Medicine

## 2016-08-04 ENCOUNTER — Ambulatory Visit (INDEPENDENT_AMBULATORY_CARE_PROVIDER_SITE_OTHER): Payer: Managed Care, Other (non HMO)

## 2016-08-04 DIAGNOSIS — J069 Acute upper respiratory infection, unspecified: Secondary | ICD-10-CM

## 2016-08-04 DIAGNOSIS — R05 Cough: Secondary | ICD-10-CM

## 2016-08-04 DIAGNOSIS — Z720 Tobacco use: Secondary | ICD-10-CM

## 2016-08-04 DIAGNOSIS — R059 Cough, unspecified: Secondary | ICD-10-CM

## 2016-08-04 DIAGNOSIS — I10 Essential (primary) hypertension: Secondary | ICD-10-CM

## 2016-08-04 MED ORDER — AMOXICILLIN 500 MG PO CAPS: 500.0000 mg | ORAL_CAPSULE | Freq: Three times a day (TID) | ORAL | 0 refills | Status: DC

## 2016-08-04 MED ORDER — HYDROCODONE-HOMATROPINE 5-1.5 MG/5ML PO SYRP: 5.0000 mL | ORAL_SOLUTION | Freq: Four times a day (QID) | ORAL | 0 refills | Status: DC | PRN

## 2016-08-04 NOTE — ED Triage Notes (Signed)
Pt  Reports  Symptoms   Of  Cough  /  Congestion         With   Pain  In  Her  Chest  From   Coughing  Pt is  A  Smoker        And  Is  A  Diabetic

## 2016-08-04 NOTE — ED Provider Notes (Signed)
CSN: [id number]     Arrival date & time 08/04/16  [id number] History   First MD Initiated Contact with Patient 08/04/16 1948     Chief Complaint  Patient presents with  . Cough   (Consider location/radiation/quality/duration/timing/severity/associated sxs/prior Treatment) 40 yo female with a history of uncontrolled HTN, Type 2 DM and non-compliance history presents with cough x 3 weeks. She was evaluated with her PCP and a CXR was scheduled but she did not go. She has not followed up for HTN and DM and admits to not taking her medications daily. She continues to smoke. She is using albuterol inhalers and Robitussin without relief.  The cough has been ongoing, without fevers. Noted malaise. See ROS.       Past Medical History:  Diagnosis Date  . Anxiety   . Chronic back pain    tx with ibuprofen  . Diabetes mellitus   . Hypercholesteremia   . Hyperlipidemia    no meds - tx with diet  . Hypertension   . Hypothyroidism   . 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   C&B WNL  NEG PAPS after  . 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 Thyroid dysfunction    Past Surgical History:  Procedure Laterality Date  . [place] DECOMPRESSION  2008  . HYSTEROSCOPY X 2  2007 / 2012   ENDOMETRIAL POLYPS REMOVED   Family History  Problem Relation Age of Onset  . Hypertension Sister   . Lupus Mother   . Hypertension Mother   . Heart disease Father   . Gout Father   . Lupus Father    Social History  Substance Use Topics  . Smoking status: Former Smoker    Packs/day: 0.50    Types: Cigarettes  . Smokeless tobacco: Never Used  . Alcohol use 0.0 oz/week     Comment: socially - wine/liquor--Rare   OB History    Gravida Para Term Preterm AB Living   0             SAB TAB Ectopic Multiple Live Births                 Review of Systems  Constitutional: Positive for fatigue. Negative for fever.  HENT: Positive for congestion and sinus pressure.    Respiratory: Positive for cough and wheezing. Negative for apnea, chest tightness and shortness of breath.   Skin: Negative.   Neurological: Negative for dizziness.    Allergies  Review of patient's allergies indicates no known allergies.  Home Medications   Prior to Admission medications   Medication Sig Start Date End Date Taking? Authorizing Provider  albuterol (PROVENTIL HFA;VENTOLIN HFA) 108 (90 Base) MCG/ACT inhaler Inhale 2 puffs into the lungs every 6 (six) hours as needed for wheezing or shortness of breath. 03/10/16   Leeanne Rio, MD  amoxicillin (AMOXIL) 500 MG capsule Take 1 capsule (500 mg total) by mouth 3 (three) times daily. 08/04/16   Bjorn Pippin, PA-C  atorvastatin (LIPITOR) 40 MG tablet TAKE 1 TABLET DAILY 07/11/16   [name], MD  BD ULTRA-FINE PEN NEEDLES 29G X 12.7MM MISC USE THREE TIMES A DAY BEFORE MEALS 06/10/13   [name], MD  benzonatate (TESSALON) 100 MG capsule Take 1 capsule (100 mg total) by mouth 2 (two) times daily as needed for cough. Patient not taking: Reported on 07/14/2016 02/23/16   Lind Covert, MD  cetirizine (ZYRTEC) 10 MG tablet Take 1 tablet (10  mg total) by mouth daily. 06/14/16   Asiyah Cletis Media, MD  cyclobenzaprine (FLEXERIL) 10 MG tablet Take 0.5-1 tablets (5-10 mg total) by mouth 3 (three) times daily as needed for muscle spasms. Patient not taking: Reported on 07/14/2016 03/10/16   Leeanne Rio, MD  enalapril (VASOTEC) 10 MG tablet Take 10 mg by mouth daily.    Historical Provider, MD  HYDROcodone-homatropine (HYCODAN) 5-1.5 MG/5ML syrup Take 5 mLs by mouth every 6 (six) hours as needed for cough. 08/04/16   Bjorn Pippin, PA-C  ibuprofen (ADVIL,MOTRIN) 800 MG tablet Take 1 tablet (800 mg total) by mouth every 8 (eight) hours as needed. 09/16/14   Renee A Kuneff, DO  Insulin Lispro, Human, (HUMALOG KWIKPEN [place]) Inject 20 Units into the skin 3 (three) times daily before meals.     Historical Provider, MD   insulin NPH Human (HUMULIN N,NOVOLIN N) 100 UNIT/ML injection Inject into the skin.    Historical Provider, MD  levothyroxine (SYNTHROID, LEVOTHROID) 125 MCG tablet Take 125 mcg by mouth daily before breakfast. Reported on 02/23/2016    Historical Provider, MD  omeprazole (PRILOSEC) 20 MG capsule Take 1 capsule (20 mg total) by mouth daily. Patient not taking: Reported on 07/14/2016 03/10/16   Leeanne Rio, MD   Meds Ordered and Administered this Visit  Medications - No data to display  BP (!) 166/107 (BP Location: Left Arm) Comment: notified rn  Pulse (!) 127 Comment: notified rn  Temp 99.1 F (37.3 C) (Oral)   Resp 16   LMP 07/14/2016   SpO2 100%  No data found.   Physical Exam  Constitutional: She appears well-developed and well-nourished. No distress.  Neck: Normal range of motion.  Cardiovascular: Regular rhythm.   Tachy at 100 on recheck of vitals by myself  Pulmonary/Chest: Effort normal. She has wheezes.  Mild wheezes in bases, rhonchi throughout  Lymphadenopathy:    She has no cervical adenopathy.  Neurological: She is alert.  Skin: Skin is warm and dry. She is not diaphoretic.  Psychiatric: Her behavior is normal.  Nursing note and vitals reviewed.   Urgent Care Course   Clinical Course    Procedures (including critical care time)  Labs Review Labs Reviewed - No data to display  Imaging Review Dg Chest 2 View  Result Date: 08/04/2016 CLINICAL DATA:  Cough for 2 months.  Tobacco use EXAM: CHEST  2 VIEW COMPARISON:  Apr 02, 2012 FINDINGS: Lungs are clear. Heart size and pulmonary vascularity are normal. No adenopathy. No bone lesions. IMPRESSION: No edema or consolidation. Electronically Signed   By: Lowella Grip III M.D.   On: 08/04/2016 20:30     Visual Acuity Review  Right Eye Distance:   Left Eye Distance:   Bilateral Distance:    Right Eye Near:   Left Eye Near:    Bilateral Near:         MDM   1. URI (upper respiratory  infection)   2. Cough   3. Hypertension, malignant    Cover with antibiotic given duration. No steroids in setting of uncontrolled Diabetes and mild tachycardia due to not having her anti-hypertensive today. She is instructed to f/u with PCP and keep her medications going regularly as directed. Hycodan is given for cough. Work note is also given. No evidence of pneumonia. We discussed smoking cessation is detail. She is not interested at this time. Continue use of albuterol. She is stable for discharge.     Bjorn Pippin, PA-C  08/04/16 2101  

## 2016-08-04 NOTE — Discharge Instructions (Signed)
Normal chest xray. Will give you an antibiotic to cover for infection. You must take your blood pressure medications every day as your reading was high today. Please get in with your PCP for follow up.

## 2016-09-19 ENCOUNTER — Encounter: Payer: Self-pay | Admitting: Family Medicine

## 2016-09-19 ENCOUNTER — Ambulatory Visit (INDEPENDENT_AMBULATORY_CARE_PROVIDER_SITE_OTHER): Payer: Managed Care, Other (non HMO) | Admitting: Family Medicine

## 2016-09-19 VITALS — BP 153/98 | HR 93 | Temp 98.3°F | Wt 263.4 lb

## 2016-09-19 DIAGNOSIS — E785 Hyperlipidemia, unspecified: Secondary | ICD-10-CM

## 2016-09-19 DIAGNOSIS — Z794 Long term (current) use of insulin: Secondary | ICD-10-CM

## 2016-09-19 DIAGNOSIS — Z114 Encounter for screening for human immunodeficiency virus [HIV]: Secondary | ICD-10-CM | POA: Diagnosis not present

## 2016-09-19 DIAGNOSIS — E039 Hypothyroidism, unspecified: Secondary | ICD-10-CM | POA: Diagnosis not present

## 2016-09-19 DIAGNOSIS — E1165 Type 2 diabetes mellitus with hyperglycemia: Secondary | ICD-10-CM

## 2016-09-19 DIAGNOSIS — I10 Essential (primary) hypertension: Secondary | ICD-10-CM

## 2016-09-19 DIAGNOSIS — IMO0002 Reserved for concepts with insufficient information to code with codable children: Secondary | ICD-10-CM

## 2016-09-19 DIAGNOSIS — G8929 Other chronic pain: Secondary | ICD-10-CM

## 2016-09-19 DIAGNOSIS — E118 Type 2 diabetes mellitus with unspecified complications: Secondary | ICD-10-CM | POA: Diagnosis not present

## 2016-09-19 DIAGNOSIS — M549 Dorsalgia, unspecified: Secondary | ICD-10-CM

## 2016-09-19 DIAGNOSIS — Z23 Encounter for immunization: Secondary | ICD-10-CM

## 2016-09-19 LAB — POCT GLYCOSYLATED HEMOGLOBIN (HGB A1C): HEMOGLOBIN A1C: 11.6

## 2016-09-19 MED ORDER — ENALAPRIL MALEATE 20 MG PO TABS: 20.0000 mg | ORAL_TABLET | Freq: Every day | ORAL | 1 refills | Status: DC
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
## 2016-09-19 NOTE — Patient Instructions (Addendum)
It was nice seeing you again. Your DM is poorly controlled likely due to poor medication compliance. Please see Dr. Chalmers Cater soon for medication adjustment. I have forwarded your A1C result to her as asked. Your BP is not goal. We have increased your Enalapril to 20 mg daily. Please start taking baby Aspirin in addition to your current regimen. I will call you with other blood test result.

## 2016-09-19 NOTE — Assessment & Plan Note (Addendum)
Poor control due to poor compliance with A1C of 11 today. Counseling done about compliance. Insulin dose adjustment not needed at this time given poor compliance. Per patient, to forward A1C result to her endocrinologist which I did. She has f/u appointment already scheduled with her. Continue home CBG monitoring and again I stressed medication compliance. DM foot exam completed today. Sign release of information today to obtain eye exam report from her ophthalmologist. Continue DM management by the endocrinologist.
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
## 2016-09-19 NOTE — Assessment & Plan Note (Signed)
Compliant with her synthroid per patient. TSH checked today. I will call her with result.

## 2016-09-19 NOTE — Assessment & Plan Note (Addendum)
BP not optimally controlled. I reviewed her flow sheet, her BP has been up and down but mostly elevated. Increase Enalapril from 10 mg to 20 mg qd. Continue home BP monitoring. Bmet checked today. F/U in 4-8 weeks or sooner if having any concern. She verbalized understanding.  Note: Birth control discussed with patient since she is on Enalapril. She stated she is not on any birth control and she's not been able to conceive for years.  I discussed with her that Enalapril is contraindicated in pregnancy, she stated she knows that. Advised to d/c medication immediately she realizes she is pregnant. She is not interested in birth control at this time.

## 2016-09-19 NOTE — Assessment & Plan Note (Signed)
FLP checked today. Continue current regimen.

## 2016-09-20 ENCOUNTER — Telehealth: Payer: Self-pay | Admitting: Family Medicine

## 2016-09-20 LAB — LIPID PANEL
Cholesterol: 224 mg/dL — ABNORMAL HIGH (ref <200)
HDL: 25 mg/dL — AB (ref >50)
LDL CALC: 149 mg/dL — AB
Total CHOL/HDL Ratio: 9 Ratio — ABNORMAL HIGH (ref <5.0)
Triglycerides: 249 mg/dL — ABNORMAL HIGH (ref <150)
VLDL: 50 mg/dL — AB (ref <30)

## 2016-09-20 LAB — BASIC METABOLIC PANEL
BUN: 7 mg/dL (ref 7–25)
CHLORIDE: 103 mmol/L (ref 98–110)
CO2: 25 mmol/L (ref 20–31)
CREATININE: 0.74 mg/dL (ref 0.50–1.10)
Calcium: 9.3 mg/dL (ref 8.6–10.2)
Glucose, Bld: 247 mg/dL — ABNORMAL HIGH (ref 65–99)
POTASSIUM: 4.2 mmol/L (ref 3.5–5.3)
Sodium: 138 mmol/L (ref 135–146)

## 2016-09-20 LAB — HIV ANTIBODY (ROUTINE TESTING W REFLEX): HIV 1&2 Ab, 4th Generation: NONREACTIVE

## 2016-09-20 LAB — TSH: TSH: 3.8 m[IU]/L

## 2016-09-20 MED ORDER — ATORVASTATIN CALCIUM 80 MG PO TABS: 80.0000 mg | ORAL_TABLET | Freq: Every day | ORAL | 1 refills | Status: DC

## 2016-09-20 NOTE — Telephone Encounter (Signed)
Test results discussed with patient. Her FLP was abnormal with elevated Total Chol and Triglyceride. Her LDL improved from the last but still above goal for her. She again endorsed good compliance with her Lipitor 40 mg. As discussed with her, I will increase her dose to 80 mg qd. F/U in 3-4 months for health maintenance. Repeat FLP in 6-12 months.  She verbalized understanding and agreed with plan.

## 2017-01-31 ENCOUNTER — Telehealth: Payer: Self-pay | Admitting: Internal Medicine

## 2017-01-31 NOTE — Telephone Encounter (Signed)
**  After Hours/ Emergency Line Call*  Received a call to report that Tanya Nolan is having swelling in both of her legs over the past 2-3 hours. This has never happened to her before. She is having some pain in her left calf. Her legs are the same size. She has not noticed any warmth of her calves. Her calves are not tender to the touch. No recent long car rides or plane rides. No recent immobilization or surgeries. No personal history of blood clots or CHF. Not taking any birth control or other hormones. No shortness of breath, chest pain, or palpitations. She is urinating like normal. Her family history is positive for a blood clot in her mother. Unsure if she is having a DVT vs CHF vs some other etiology.  Patient states that she will not to go to the ED. She prefers to be seen in same day clinic tomorrow morning. Appointment scheduled with Dr. Brita Romp at 9:30AM.  We discussed reasons to go to the ED immediately, including worsening lower extremity edema, shortness of breath, or chest pain.  Will forward to PCP and Dr. Brita Romp who will be seeing her in same day clinic tomorrow.  Evette Doffing, MD PGY-2, [facility]

## 2017-02-01 ENCOUNTER — Encounter: Payer: Self-pay | Admitting: Family Medicine

## 2017-02-01 ENCOUNTER — Ambulatory Visit (INDEPENDENT_AMBULATORY_CARE_PROVIDER_SITE_OTHER): Payer: Managed Care, Other (non HMO) | Admitting: Family Medicine

## 2017-02-01 VITALS — BP 152/84 | HR 86 | Temp 98.4°F | Wt 269.0 lb

## 2017-02-01 DIAGNOSIS — R600 Localized edema: Secondary | ICD-10-CM | POA: Diagnosis not present

## 2017-02-01 NOTE — Assessment & Plan Note (Signed)
Likely related to venous insufficiency as it was worse after standing on her feet all day and is better this morning Very low concern for DVT as it has resolved No evidence of cellulitis Low concern for CHF as it has resolved after resting Patient has no pulmonary symptoms Advised compression stockings and elevation as well as avoiding standing in place when possible at work Check CMP

## 2017-02-01 NOTE — Progress Notes (Signed)
   Subjective:   Tanya Nolan is a 41 y.o. female with a history of Obesity, HTN, hypothyroidism, T2 DM here for same day appointment for  Chief Complaint  Patient presents with  . Leg Swelling     Patient noticed bilateral leg swelling that was uncomfortable last night after getting home from work. She's had intermittent pain in bilateral feet for the last 2 months, but has not noticed any swelling prior to this. She has no personal history of DVT or PE. 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 She does not wear any compression socks. She wears sneakers to work, but finds them uncomfortable and thinks they're the cause of her foot pain. She is not taking any medications that could be causing edema and has no history of heart failure. She denies any shortness of breath, calf pain, fevers, chest pain, recent plane ride or long car rides, recent surgery, recent immobilization. The swelling had resolved this morning when she woke up.  Review of Systems:  Per HPI.   Social History: Smoking status noted  Objective:  BP (!) 152/84   Pulse 86   Temp 98.4 F (36.9 C) (Oral)   Wt 269 lb (122 kg)   LMP 01/27/2017   SpO2 99%   BMI 40.90 kg/m   Gen:  41 y.o. female in NAD HEENT: NCAT, MMM, anicteric sclerae CV: RRR, no MRG Resp: Non-labored, CTAB, no wheezes noted Ext: WWP, no edema, no calf Tenderness, negative Homans sign, no palpable cords MSK: No obvious deformities, gait intact Skin: No rashes Neuro: Alert and oriented, speech normal       Chemistry      Component Value Date/Time   NA 138 09/19/2016 1201   K 4.2 09/19/2016 1201   CL 103 09/19/2016 1201   CO2 25 09/19/2016 1201   BUN 7 09/19/2016 1201   CREATININE 0.74 09/19/2016 1201      Component Value Date/Time   CALCIUM 9.3 09/19/2016 1201   ALKPHOS 61 10/31/2011 0855   AST 11 10/31/2011 0855   ALT 13 10/31/2011 0855   BILITOT 0.2 (L) 10/31/2011 0855      Lab Results  Component Value Date   WBC 10.5  12/25/2014   HGB 12.6 12/25/2014   HCT 38.0 12/25/2014   MCV 84.3 12/25/2014   PLT 355 12/25/2014   Lab Results  Component Value Date   TSH 3.80 09/19/2016   Lab Results  Component Value Date   HGBA1C 11.6 09/19/2016   Assessment & Plan:     Tanya Nolan is a 41 y.o. female here for   Lower extremity edema Likely related to venous insufficiency as it was worse after standing on her feet all day and is better this morning Very low concern for DVT as it has resolved No evidence of cellulitis Low concern for CHF as it has resolved after resting Patient has no pulmonary symptoms Advised compression stockings and elevation as well as avoiding standing in place when possible at work Check CMP   BP noted to be elevated. Patient reports she has not taken her medications yet this morning. Advised follow-up with PCP.  Virginia Crews, MD MPH PGY-3,  [facility] 02/01/2017  11:18 AM

## 2017-02-01 NOTE — Patient Instructions (Signed)

## 2017-02-02 ENCOUNTER — Encounter: Payer: Self-pay | Admitting: Family Medicine

## 2017-02-02 LAB — CMP14+EGFR
A/G RATIO: 1.4 (ref 1.2–2.2)
ALT: 23 IU/L (ref 0–32)
AST: 25 IU/L (ref 0–40)
Albumin: 3.8 g/dL (ref 3.5–5.5)
Alkaline Phosphatase: 72 IU/L (ref 39–117)
BILIRUBIN TOTAL: 0.5 mg/dL (ref 0.0–1.2)
BUN/Creatinine Ratio: 9 (ref 9–23)
BUN: 6 mg/dL (ref 6–20)
CALCIUM: 9 mg/dL (ref 8.7–10.2)
CHLORIDE: 103 mmol/L (ref 96–106)
CO2: 25 mmol/L (ref 18–29)
Creatinine, Ser: 0.69 mg/dL (ref 0.57–1.00)
GFR calc Af Amer: 128 mL/min/{1.73_m2} (ref >59)
GFR, EST NON AFRICAN AMERICAN: 111 mL/min/{1.73_m2} (ref >59)
Globulin, Total: 2.7 g/dL (ref 1.5–4.5)
Glucose: 174 mg/dL — ABNORMAL HIGH (ref 65–99)
POTASSIUM: 4 mmol/L (ref 3.5–5.2)
Sodium: 143 mmol/L (ref 134–144)
Total Protein: 6.5 g/dL (ref 6.0–8.5)

## 2017-10-18 ENCOUNTER — Encounter: Payer: Self-pay | Admitting: Internal Medicine

## 2017-10-18 ENCOUNTER — Other Ambulatory Visit: Payer: Self-pay

## 2017-10-18 ENCOUNTER — Ambulatory Visit (INDEPENDENT_AMBULATORY_CARE_PROVIDER_SITE_OTHER): Payer: Managed Care, Other (non HMO) | Admitting: Internal Medicine

## 2017-10-18 VITALS — BP 178/100 | HR 85 | Temp 98.8°F | Wt 256.8 lb

## 2017-10-18 DIAGNOSIS — M5442 Lumbago with sciatica, left side: Secondary | ICD-10-CM | POA: Diagnosis not present

## 2017-10-18 DIAGNOSIS — M5441 Lumbago with sciatica, right side: Secondary | ICD-10-CM

## 2017-10-18 DIAGNOSIS — Z23 Encounter for immunization: Secondary | ICD-10-CM

## 2017-10-18 DIAGNOSIS — G8929 Other chronic pain: Secondary | ICD-10-CM | POA: Diagnosis not present

## 2017-10-18 MED ORDER — METAXALONE 800 MG PO TABS: 800.0000 mg | ORAL_TABLET | Freq: Three times a day (TID) | ORAL | 0 refills | Status: DC

## 2017-10-18 MED ORDER — MELOXICAM 15 MG PO TABS: 15.0000 mg | ORAL_TABLET | Freq: Every day | ORAL | 0 refills | Status: DC
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
## 2017-10-18 NOTE — Patient Instructions (Addendum)
It was nice meeting you today Tanya Nolan!  For your back pain, please begin taking Mobic (meloxicam) one tablet daily in the morning. You can also take the muscle relaxer (Skelaxin) up to every 8 hours as needed.   We will call you with the date and time of your MRI.   If you develop worsening pain, numbness or weakness in your legs, or bowel or bladder incontinence, please call to let us know or go to the emergency room.   If you have any questions or concerns, please feel free to call the clinic.   Be well,  Dr. Avon Gully

## 2017-10-18 NOTE — Progress Notes (Signed)
Hello Blue,  Please help patient schedule follow-up with me in the next few days for BP management. Looks like her BP was elevated when she was seen today in the clinic. Thanks.
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
## 2017-10-27 ENCOUNTER — Ambulatory Visit (HOSPITAL_COMMUNITY): Payer: Managed Care, Other (non HMO)

## 2017-10-31 ENCOUNTER — Ambulatory Visit: Payer: Managed Care, Other (non HMO) | Admitting: Internal Medicine

## 2017-11-14 ENCOUNTER — Other Ambulatory Visit: Payer: Self-pay | Admitting: Internal Medicine

## 2019-02-25 ENCOUNTER — Telehealth: Payer: Self-pay

## 2019-02-25 ENCOUNTER — Other Ambulatory Visit: Payer: Self-pay

## 2019-02-25 ENCOUNTER — Telehealth (INDEPENDENT_AMBULATORY_CARE_PROVIDER_SITE_OTHER): Payer: Self-pay | Admitting: Family Medicine

## 2019-02-25 DIAGNOSIS — E1165 Type 2 diabetes mellitus with hyperglycemia: Secondary | ICD-10-CM

## 2019-02-25 DIAGNOSIS — R05 Cough: Secondary | ICD-10-CM

## 2019-02-25 DIAGNOSIS — R059 Cough, unspecified: Secondary | ICD-10-CM

## 2019-02-25 NOTE — Assessment & Plan Note (Signed)
Very likely poorly controlled. Urged her to see [place] and her endocrinologist soon after her respiratory symptoms resolve

## 2019-02-25 NOTE — Assessment & Plan Note (Addendum)
Likely viral infection, could be covid. No signs of respiratory compromise or bacterial pneumonia.  Recommend symptomatic treatment and monitor symptoms call us if not better in a few days or if worsening.  Stop smoking.  Do not work (see letter) She will call with her fax number

## 2019-02-25 NOTE — Telephone Encounter (Signed)
Pt called nurse line requesting her work note to be faxed to her job. Fax number provided [phone]. Note faxed.

## 2019-02-25 NOTE — Progress Notes (Signed)
Subjective  Tanya Nolan is a 43 y.o. female is presenting with the following  [facility] Visit  Patient consented to have virtual visit. Method of visit: Video was attempted, but technology challenges prevented patient from using video, so visit was conducted via telephone.  Encounter participants: Patient: Tanya Nolan - located at home Provider: Lind Covert - located at office Others (if applicable): none  Chief Complaint: Cough  HPI:  For last 3 days has cough, fatigue, nose drainage, loss of smell.  Using Zyzol antihistamine not much help.  Feels better today and no shortness of breath or fever or rash or leg swelling.  No sick contacts but works at [employer]: per HPI  Pertinent PMHx: Diabetes - has not seen her endocrinologist for 8 months, not taking insulin.  Smokes No history of chronic resp problems  Exam:  Respiratory: sounds congested with cough normal conversation no audible dyspnea Psych:  Cognition and judgment appear intact. Alert, communicative  and cooperative with normal attention span and concentration. No apparent delusions, illusions, hallucinations   Assessment/Plan:  Cough Likely viral infection, could be covid. No signs of respiratory compromise or bacterial pneumonia.  Recommend symptomatic treatment and monitor symptoms call us if not better in a few days or if worsening.  Stop smoking.  Do not work (see letter)   DM (diabetes mellitus), type 2, uncontrolled Very likely poorly controlled. Urged her to see [place] and her endocrinologist soon after her respiratory symptoms resolve     Time spent during visit with patient: 22 minutes

## 2019-02-28 ENCOUNTER — Telehealth: Payer: Self-pay | Admitting: *Deleted

## 2019-02-28 NOTE — Telephone Encounter (Signed)
Pt is requesting a new note to reflect being out of work until 4/20.

## 2019-02-28 NOTE — Telephone Encounter (Signed)
Pt lm on nurse line requesting a return call about televisit on Tuesday.   Returned call, but had to LM on pts VM. Christen Bame, CMA

## 2019-03-03 NOTE — Telephone Encounter (Signed)
She is back at work Feeling muchimproved just some congestion No shortness of breath or fever   Suggested if any worsening she should call us  Suggested call and make telemed visit with her PCP to address her diabetes   She agrees

## 2019-06-20 ENCOUNTER — Telehealth: Payer: Self-pay | Admitting: Family Medicine

## 2019-06-20 ENCOUNTER — Other Ambulatory Visit: Payer: Self-pay

## 2019-06-20 ENCOUNTER — Ambulatory Visit (INDEPENDENT_AMBULATORY_CARE_PROVIDER_SITE_OTHER): Payer: No Typology Code available for payment source | Admitting: Family Medicine

## 2019-06-20 ENCOUNTER — Encounter: Payer: Self-pay | Admitting: Family Medicine

## 2019-06-20 ENCOUNTER — Other Ambulatory Visit (HOSPITAL_COMMUNITY)
Admission: RE | Admit: 2019-06-20 | Discharge: 2019-06-20 | Disposition: A | Discharge status: Home / Self Care | Payer: No Typology Code available for payment source | Source: Ambulatory Visit | Attending: Family Medicine | Admitting: Family Medicine

## 2019-06-20 VITALS — BP 152/94 | HR 92 | Ht 68.0 in | Wt 231.5 lb

## 2019-06-20 DIAGNOSIS — Z23 Encounter for immunization: Secondary | ICD-10-CM

## 2019-06-20 DIAGNOSIS — R109 Unspecified abdominal pain: Secondary | ICD-10-CM | POA: Diagnosis present

## 2019-06-20 DIAGNOSIS — E1169 Type 2 diabetes mellitus with other specified complication: Secondary | ICD-10-CM

## 2019-06-20 DIAGNOSIS — E1165 Type 2 diabetes mellitus with hyperglycemia: Secondary | ICD-10-CM | POA: Diagnosis not present

## 2019-06-20 DIAGNOSIS — B9689 Other specified bacterial agents as the cause of diseases classified elsewhere: Secondary | ICD-10-CM

## 2019-06-20 DIAGNOSIS — N760 Acute vaginitis: Secondary | ICD-10-CM

## 2019-06-20 DIAGNOSIS — E039 Hypothyroidism, unspecified: Secondary | ICD-10-CM

## 2019-06-20 DIAGNOSIS — Z01419 Encounter for gynecological examination (general) (routine) without abnormal findings: Secondary | ICD-10-CM

## 2019-06-20 DIAGNOSIS — I10 Essential (primary) hypertension: Secondary | ICD-10-CM

## 2019-06-20 DIAGNOSIS — E785 Hyperlipidemia, unspecified: Secondary | ICD-10-CM

## 2019-06-20 LAB — POCT URINALYSIS DIP (MANUAL ENTRY)
Glucose, UA: 100 mg/dL — AB
Ketones, POC UA: NEGATIVE mg/dL
Leukocytes, UA: NEGATIVE
Nitrite, UA: NEGATIVE
Protein Ur, POC: 30 mg/dL — AB
Spec Grav, UA: >=1.03 — AB (ref 1.010–1.025)
Urobilinogen, UA: 4 E.U./dL — AB
pH, UA: 6 (ref 5.0–8.0)

## 2019-06-20 LAB — POCT GLYCOSYLATED HEMOGLOBIN (HGB A1C): HbA1c, POC (controlled diabetic range): 11.1 % — AB (ref 0.0–7.0)

## 2019-06-20 LAB — POCT WET PREP (WET MOUNT)
Clue Cells Wet Prep Whiff POC: POSITIVE
Trichomonas Wet Prep HPF POC: ABSENT

## 2019-06-20 LAB — POCT UA - MICROSCOPIC ONLY

## 2019-06-20 LAB — POCT URINE PREGNANCY: Preg Test, Ur: NEGATIVE

## 2019-06-20 MED ORDER — ATORVASTATIN CALCIUM 80 MG PO TABS: 80.0000 mg | ORAL_TABLET | Freq: Every day | ORAL | 1 refills | Status: DC

## 2019-06-20 MED ORDER — POLYETHYLENE GLYCOL 3350 17 GM/SCOOP PO POWD: 17.0000 g | Freq: Every day | ORAL | 1 refills | Status: DC | PRN

## 2019-06-20 MED ORDER — ENALAPRIL MALEATE 10 MG PO TABS: 10.0000 mg | ORAL_TABLET | Freq: Every day | ORAL | 1 refills | Status: DC

## 2019-06-20 MED ORDER — ONDANSETRON 4 MG PO TBDP: 4.0000 mg | ORAL_TABLET | Freq: Three times a day (TID) | ORAL | 0 refills | Status: DC | PRN

## 2019-06-20 MED ORDER — METRONIDAZOLE 500 MG PO TABS: 500.0000 mg | ORAL_TABLET | Freq: Two times a day (BID) | ORAL | 0 refills | Status: AC

## 2019-06-20 MED ORDER — LEVOTHYROXINE SODIUM 125 MCG PO TABS: 125.0000 ug | ORAL_TABLET | Freq: Every day | ORAL | 1 refills | Status: DC

## 2019-06-20 MED ORDER — INSULIN LISPRO (1 UNIT DIAL) 100 UNIT/ML (KWIKPEN): 20.0000 [IU] | PEN_INJECTOR | Freq: Three times a day (TID) | SUBCUTANEOUS | 1 refills | Status: DC

## 2019-06-20 NOTE — Assessment & Plan Note (Addendum)
Poorly controlled due to medication non-adherence. A1C of 11.1 today. Was on Novolin pump 75, 25 and 75 units daily till January. Since I don't manage insulin pump, I will have her restart her Humalog at 20 units TID with meal. She is advised to contact her endocrinologist today to schedule and appointment to get her back on her insulin pump. I advised her to start checking her CBG and come in with her log at her next appointment. Contact me soon if CBG is running low or high > 300. I will see her back in 4 week.  NB: Previously recommended Humalog at a higher dose of 25 TID. However, I switched back to her previous dose. I called and left a message for her to call back.  I will relay info to her.

## 2019-06-20 NOTE — Assessment & Plan Note (Signed)
TSH checked. I refilled med at recent dose. Will not need adjustment base or lab result since she has been out of meds unless her number is low or normal.

## 2019-06-20 NOTE — Progress Notes (Addendum)
Subjective:     Patient ID: Tanya Nolan, female   DOB: July 31, 1976, 43 y.o.   MRN: [id number]  HPI DM2/HTN/HLD:Off all meds since 2 months due to cost and loss of insurance She d/c her insulin pump since January of 2020 I.e 7 months ago. She has not been checking her CBG at home. She missed her endocrinology appointment 1 year ago and she has not followed up since then. She need med refilled sent to [facility] at [address]. Hypothyroidism:Off meds for more than 2 months, she needs refill. GI issue: C/O feeling of fullness, occasional LUQ pain and, N/V x 1 yr. No change in her symptoms and she experiences her symptoms at least once a week.She endorsed constipation, but no bloody bowel. Her symptoms is worse with her period, it feels like she is pregnant. LMP 7/12. She threw up this morning. No belly pain currently. Occasional dysuria which she has now. Birth control none. Sexually active. She will like to get pregnant. HM: Undecided about whether she wants to start breast cancer screening at age 74 or 17. She will let me know at her next appointment. Need vaccine update as well.  Cmet,FLP,TSH    Current Outpatient Medications on File Prior to Visit  Medication Sig Dispense Refill  . albuterol (PROVENTIL HFA;VENTOLIN HFA) 108 (90 Base) MCG/ACT inhaler Inhale 2 puffs into the lungs every 6 (six) hours as needed for wheezing or shortness of breath. (Patient not taking: Reported on 06/20/2019) 1 Inhaler 0  . atorvastatin (LIPITOR) 80 MG tablet Take 1 tablet (80 mg total) by mouth daily. (Patient not taking: Reported on 06/20/2019) 90 tablet 1  . BD ULTRA-FINE PEN NEEDLES 29G X 12.7MM MISC USE THREE TIMES A DAY BEFORE MEALS 1 each 2  . cetirizine (ZYRTEC) 10 MG tablet Take 1 tablet (10 mg total) by mouth daily. (Patient not taking: Reported on 06/20/2019) 30 tablet 11  . enalapril (VASOTEC) 20 MG tablet Take 1 tablet (20 mg total) by mouth daily. (Patient not taking: Reported on 06/20/2019) 90 tablet  1  . Insulin Lispro, Human, (HUMALOG KWIKPEN [place]) Inject 20 Units into the skin 3 (three) times daily before meals.     . insulin NPH Human (HUMULIN N,NOVOLIN N) 100 UNIT/ML injection Inject into the skin.    Marland Kitchen levothyroxine (SYNTHROID, LEVOTHROID) 125 MCG tablet Take 125 mcg by mouth daily before breakfast. Reported on 02/23/2016    . meloxicam (MOBIC) 15 MG tablet Take 1 tablet (15 mg total) by mouth daily as needed. (Patient not taking: Reported on 06/20/2019) 30 tablet 1  . [DISCONTINUED] insulin lispro protamine-insulin lispro (HUMALOG 75/25) (75-25) 100 UNIT/ML SUSP Inject 40-50 Units into the skin 2 (two) times daily with a meal. Patient takes 40 units in the morning and 50 units at night.  Verified it is Humalog 75/25.     No current facility-administered medications on file prior to visit.    Vitals:   06/20/19 0946 06/20/19 1016  BP: 134/84 (!) 152/94  Pulse: 92   SpO2: 98%   Weight: 231 lb 8 oz (105 kg)   Height: 5\' 8"  (1.727 m)      Review of Systems  Respiratory: Negative.   Cardiovascular: Negative.   Gastrointestinal: Positive for abdominal pain, constipation, nausea and vomiting. Negative for abdominal distention, blood in stool and diarrhea.  Neurological: Negative.   All other systems reviewed and are negative.      Objective:   Physical Exam Vitals signs and nursing note reviewed. Exam  conducted with a chaperone present Cherrie Distance Legette).  Neck:     Musculoskeletal: Neck supple.  Cardiovascular:     Rate and Rhythm: Normal rate and regular rhythm.     Heart sounds: Normal heart sounds. No murmur.  Pulmonary:     Effort: Pulmonary effort is normal. No respiratory distress.     Breath sounds: Normal breath sounds. No stridor. No wheezing or rhonchi.  Abdominal:     General: Abdomen is flat. Bowel sounds are normal. There is no distension.     Palpations: Abdomen is soft. There is no mass.     Tenderness: There is no abdominal tenderness. There is no right CVA  tenderness or left CVA tenderness.  Genitourinary:    Pubic Area: No rash.      Labia:        Right: No rash.        Left: No rash.      Vagina: Vaginal discharge present.     Cervix: Discharge present. No cervical motion tenderness.     Uterus: Normal.      Adnexa: Right adnexa normal and left adnexa normal.  Musculoskeletal:        General: No swelling.     Comments: Sensory exam of the foot is normal, tested with the monofilament. Good pulses, no lesions or ulcers, good peripheral pulses.   Neurological:     General: No focal deficit present.     Mental Status: She is alert and oriented to person, place, and time.        Assessment:     DM2 HTN HLD Hypothyroidism Abdominal pain Health maintenance/Gyn exam    Plan:     Check problem list For HM: She is undecided about Mammogram. PAP completed today. I will contact her with the result. Tdap and Pneumovax given today. Wet prep completed for vaginal discharge and was positive for BV. I escribed Metronidazole. GC/Chlamydia pending.

## 2019-06-20 NOTE — Assessment & Plan Note (Signed)
Checked FLP today. I refilled her meds.

## 2019-06-20 NOTE — Patient Instructions (Addendum)
It was nice seeing you today. We will get you back on your  humalog at 25 units TID. Please call endocrinologist for appointment this week. I will see you back in 4 weeks.    Gastroparesis  Gastroparesis is a condition in which food takes longer than normal to empty from the stomach. The condition is usually long-lasting (chronic). It may also be called delayed gastric emptying. There is no cure, but there are treatments and things that you can do at home to help relieve symptoms. Treating the underlying condition that causes gastroparesis can also help relieve symptoms. What are the causes? In many cases, the cause of this condition is not known. Possible causes include:  A hormone (endocrine) disorder, such as hypothyroidism or diabetes.  A nervous system disease, such as Parkinson's disease or multiple sclerosis.  Cancer, infection, or surgery that affects the stomach or vagus nerve. The vagus nerve runs from your chest, through your neck, to the lower part of your brain.  A connective tissue disorder, such as scleroderma.  Certain medicines. What increases the risk? You are more likely to develop this condition if you:  Have certain disorders or diseases, including: ? An endocrine disorder. ? An eating disorder. ? Amyloidosis. ? Scleroderma. ? Parkinson's disease. ? Multiple sclerosis. ? Cancer or infection of the stomach or the vagus nerve.  Have had surgery on the stomach or vagus nerve.  Take certain medicines.  Are female. What are the signs or symptoms? Symptoms of this condition include:  Feeling full after eating very little.  Nausea.  Vomiting.  Heartburn.  Abdominal bloating.  Inconsistent blood sugar (glucose) levels on blood tests.  Lack of appetite.  Weight loss.  Acid from the stomach coming up into the esophagus (gastroesophageal reflux).  Sudden tightening (spasm) of the stomach, which can be painful. Symptoms may come and go. Some people  may not notice any symptoms. How is this diagnosed? This condition is diagnosed with tests, such as:  Tests that check how long it takes food to move through the stomach and intestines. These tests include: ? Upper gastrointestinal (GI) series. For this test, you drink a liquid that shows up well on X-rays, and then X-rays will be taken of your intestines. ? Gastric emptying scintigraphy. For this test, you eat food that contains a small amount of radioactive material, and then scans are taken. ? Wireless capsule GI monitoring system. For this test, you swallow a pill (capsule) that records information about how foods and fluid move through your stomach.  Gastric manometry. For this test, a tube is passed down your throat and into your stomach to measure electrical and muscular activity.  Endoscopy. For this test, a long, thin tube is passed down your throat and into your stomach to check for problems in your stomach lining.  Ultrasound. This test uses sound waves to create images of inside the body. This can help rule out gallbladder disease or pancreatitis as a cause of your symptoms. How is this treated? There is no cure for gastroparesis. Treatment may include:  Treating the underlying cause.  Managing your symptoms by making changes to your diet and exercise habits.  Taking medicines to control nausea and vomiting and to stimulate stomach muscles.  Getting food through a feeding tube in the hospital. This may be done in severe cases.  Having surgery to insert a device into your body that helps improve stomach emptying and control nausea and vomiting (gastric neurostimulator). Follow these instructions at home:  Take over-the-counter and prescription medicines only as told by your health care provider.  Follow instructions from your health care provider about eating or drinking restrictions. Your health care provider may recommend that you: ? Eat smaller meals more often. ? Eat  low-fat foods. ? Eat low-fiber forms of high-fiber foods. For example, eat cooked vegetables instead of raw vegetables. ? Have only liquid foods instead of solid foods. Liquid foods are easier to digest.  Drink enough fluid to keep your urine pale yellow.  Exercise as often as told by your health care provider.  Keep all follow-up visits as told by your health care provider. This is important. Contact a health care provider if you:  Notice that your symptoms do not improve with treatment.  Have new symptoms. Get help right away if you:  Have severe abdominal pain that does not improve with treatment.  Have nausea that is severe or does not go away.  Cannot drink fluids without vomiting. Summary  Gastroparesis is a chronic condition in which food takes longer than normal to empty from the stomach.  Symptoms include nausea, vomiting, heartburn, abdominal bloating, and loss of appetite.  Eating smaller portions, and low-fat, low-fiber foods may help you manage your symptoms.  Get help right away if you have severe abdominal pain. This information is not intended to replace advice given to you by your health care provider. Make sure you discuss any questions you have with your health care provider. Document Released: 10/30/2005 Document Revised: 01/28/2018 Document Reviewed: 09/04/2017 Elsevier Patient Education  2020 [employer].

## 2019-06-20 NOTE — Assessment & Plan Note (Addendum)
Associated with N/V and constipation. 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 Good glycemic control recommended. Check Cmet. Zofran prn N/V. Miralax for constipation. F/U soon if there is no improvement. GI exam otherwise benign today.  NB: Upreg neg and UA neg for infection. I will contact her with the result.

## 2019-06-20 NOTE — Telephone Encounter (Addendum)
I was able to reach patient and I went over her results.  Plan: Return in 4 weeks for repeat UA.  Her urine bilirubin level is elevated. F/U Cmet to r/o liver pathology.  I discussed her insulin regimen and recommended Humalog 20 units TID instead of 25 units TID. She stated that she is yet to contact her endocrinologist but will call on Monday. She agreed with management and verbalized understanding.  She is aware of BV+ and need to pick up all meds from her [facility]. I answered all questions.  4 weeks f/u appointment made for her.

## 2019-06-20 NOTE — Telephone Encounter (Signed)
HIPAA compliant callback message left.   Whenever she calls please inform her of the following. 1. Pregnancy test is negative and urine is negative for infection.  2. She has protein and small blood in her urine. Protein likely due to her diabetes. We will recheck Urine in 4 weeks. Please help her make f/u appointment.  3. I escribed Humalog at her previous dose of 20 units TID. Advise not to take 25 units for now till she sees her endocrinologist.  4. I prescribed Zofran for nausea. Start Miralax for constipation and I have refilled all her medications to her requested pharmacy.  5. Her wet prep is positive for BV and I sent an A/B to the pharmacy. Gonorrhea and Chlamydia test are pending.

## 2019-06-20 NOTE — Assessment & Plan Note (Signed)
BP elevated. Measured by me. I doubt the initial readings by the CMA. Restart Lisinopril at a lower dose of 10 mg qd. Previously on 20 mg qd. Home BP monitoring recommended. F/U in 4 weeks.

## 2019-06-21 LAB — CMP14+EGFR
ALT: 9 IU/L (ref 0–32)
AST: 12 IU/L (ref 0–40)
Albumin/Globulin Ratio: 1.5 (ref 1.2–2.2)
Albumin: 4 g/dL (ref 3.8–4.8)
Alkaline Phosphatase: 77 IU/L (ref 39–117)
BUN/Creatinine Ratio: 11 (ref 9–23)
BUN: 8 mg/dL (ref 6–24)
Bilirubin Total: 0.2 mg/dL (ref 0.0–1.2)
CO2: 23 mmol/L (ref 20–29)
Calcium: 9.2 mg/dL (ref 8.7–10.2)
Chloride: 102 mmol/L (ref 96–106)
Creatinine, Ser: 0.7 mg/dL (ref 0.57–1.00)
GFR calc Af Amer: 125 mL/min/{1.73_m2} (ref >59)
GFR calc non Af Amer: 109 mL/min/{1.73_m2} (ref >59)
Globulin, Total: 2.6 g/dL (ref 1.5–4.5)
Glucose: 244 mg/dL — ABNORMAL HIGH (ref 65–99)
Potassium: 3.9 mmol/L (ref 3.5–5.2)
Sodium: 138 mmol/L (ref 134–144)
Total Protein: 6.6 g/dL (ref 6.0–8.5)

## 2019-06-21 LAB — LIPID PANEL
Chol/HDL Ratio: 8.6 ratio — ABNORMAL HIGH (ref 0.0–4.4)
Cholesterol, Total: 241 mg/dL — ABNORMAL HIGH (ref 100–199)
HDL: 28 mg/dL — ABNORMAL LOW (ref >39)
LDL Calculated: 175 mg/dL — ABNORMAL HIGH (ref 0–99)
Triglycerides: 191 mg/dL — ABNORMAL HIGH (ref 0–149)
VLDL Cholesterol Cal: 38 mg/dL (ref 5–40)

## 2019-06-21 LAB — TSH: TSH: 3.98 u[IU]/mL (ref 0.450–4.500)

## 2019-06-25 LAB — CYTOLOGY - PAP
Chlamydia: NEGATIVE
Diagnosis: NEGATIVE
HPV: NOT DETECTED
Neisseria Gonorrhea: NEGATIVE
Trichomonas: NEGATIVE

## 2019-07-15 ENCOUNTER — Telehealth: Payer: Self-pay | Admitting: Family Medicine

## 2019-07-15 ENCOUNTER — Ambulatory Visit (INDEPENDENT_AMBULATORY_CARE_PROVIDER_SITE_OTHER): Payer: No Typology Code available for payment source | Admitting: Family Medicine

## 2019-07-15 ENCOUNTER — Other Ambulatory Visit: Payer: Self-pay

## 2019-07-15 ENCOUNTER — Encounter: Payer: Self-pay | Admitting: Family Medicine

## 2019-07-15 VITALS — BP 132/90 | HR 86

## 2019-07-15 DIAGNOSIS — I10 Essential (primary) hypertension: Secondary | ICD-10-CM

## 2019-07-15 DIAGNOSIS — R8290 Unspecified abnormal findings in urine: Secondary | ICD-10-CM | POA: Insufficient documentation

## 2019-07-15 DIAGNOSIS — Z1239 Encounter for other screening for malignant neoplasm of breast: Secondary | ICD-10-CM

## 2019-07-15 DIAGNOSIS — Z720 Tobacco use: Secondary | ICD-10-CM

## 2019-07-15 DIAGNOSIS — E119 Type 2 diabetes mellitus without complications: Secondary | ICD-10-CM | POA: Diagnosis not present

## 2019-07-15 DIAGNOSIS — E1165 Type 2 diabetes mellitus with hyperglycemia: Secondary | ICD-10-CM

## 2019-07-15 DIAGNOSIS — K649 Unspecified hemorrhoids: Secondary | ICD-10-CM

## 2019-07-15 LAB — POCT URINALYSIS DIP (MANUAL ENTRY)
Glucose, UA: 100 mg/dL — AB
Leukocytes, UA: NEGATIVE
Nitrite, UA: NEGATIVE
Spec Grav, UA: >=1.03 — AB (ref 1.010–1.025)
Urobilinogen, UA: 1 E.U./dL
pH, UA: 6 (ref 5.0–8.0)

## 2019-07-15 LAB — POCT UA - MICROSCOPIC ONLY

## 2019-07-15 LAB — POCT CBG (FASTING - GLUCOSE)-MANUAL ENTRY: Glucose Fasting, POC: 163 mg/dL — AB (ref 70–99)

## 2019-07-15 MED ORDER — INSULIN LISPRO (1 UNIT DIAL) 100 UNIT/ML (KWIKPEN): 5.0000 [IU] | PEN_INJECTOR | Freq: Three times a day (TID) | SUBCUTANEOUS | 1 refills | Status: DC

## 2019-07-15 MED ORDER — FREESTYLE SYSTEM KIT: PACK | 0 refills | Status: DC

## 2019-07-15 MED ORDER — INSULIN GLARGINE 100 UNIT/ML SOLOSTAR PEN: 15.0000 [IU] | PEN_INJECTOR | SUBCUTANEOUS | 1 refills | Status: DC

## 2019-07-15 NOTE — Patient Instructions (Signed)
It was nice seeing you today. Please restart Lantus at 15 units daily. Also reduce your Humulog to 5 units TID with meals. Please check your capillary glucose TID. Come in with your report in 1 weeks. Call if your glucose is low I.e <80 or you have hypoglycemic symptoms.    Hypoglycemia Hypoglycemia is when the sugar (glucose) level in your blood is too low. Signs of low blood sugar may include:  Feeling: ? Hungry. ? Worried or nervous (anxious). ? Sweaty and clammy. ? Confused. ? Dizzy. ? Sleepy. ? Sick to your stomach (nauseous).  Having: ? A fast heartbeat. ? A headache. ? A change in your vision. ? Tingling or no feeling (numbness) around your mouth, lips, or tongue. ? Jerky movements that you cannot control (seizure).  Having trouble with: ? Moving (coordination). ? Sleeping. ? Passing out (fainting). ? Getting upset easily (irritability). Low blood sugar can happen to people who have diabetes and people who do not have diabetes. Low blood sugar can happen quickly, and it can be an emergency. Treating low blood sugar Low blood sugar is often treated by eating or drinking something sugary right away, such as:  Fruit juice, 4-6 oz (120-150 mL).  Regular soda (not diet soda), 4-6 oz (120-150 mL).  Low-fat milk, 4 oz (120 mL).  Several pieces of hard candy.  Sugar or honey, 1 Tbsp (15 mL). Treating low blood sugar if you have diabetes If you can think clearly and swallow safely, follow the 15:15 rule:  Take 15 grams of a fast-acting carb (carbohydrate). Talk with your doctor about how much you should take.  Always keep a source of fast-acting carb with you, such as: ? Sugar tablets (glucose pills). Take 3-4 pills. ? 6-8 pieces of hard candy. ? 4-6 oz (120-150 mL) of fruit juice. ? 4-6 oz (120-150 mL) of regular (not diet) soda. ? 1 Tbsp (15 mL) honey or sugar.  Check your blood sugar 15 minutes after you take the carb.  If your blood sugar is still at or below  70 mg/dL (3.9 mmol/L), take 15 grams of a carb again.  If your blood sugar does not go above 70 mg/dL (3.9 mmol/L) after 3 tries, get help right away.  After your blood sugar goes back to normal, eat a meal or a snack within 1 hour.  Treating very low blood sugar If your blood sugar is at or below 54 mg/dL (3 mmol/L), you have very low blood sugar (severe hypoglycemia). This may also cause:  Passing out.  Jerky movements you cannot control (seizure).  Losing consciousness (coma). This is an emergency. Do not wait to see if the symptoms will go away. Get medical help right away. Call your local emergency services (911 in the [place] Do not drive yourself to the hospital. If you have very low blood sugar and you cannot eat or drink, you may need a glucagon shot (injection). A family member or friend should learn how to check your blood sugar and how to give you a glucagon shot. Ask your doctor if you need to have a glucagon shot kit at home. Follow these instructions at home: General instructions  Take over-the-counter and prescription medicines only as told by your doctor.  Stay aware of your blood sugar as told by your doctor.  Limit alcohol intake to no more than 1 drink a day for nonpregnant women and 2 drinks a day for men. One drink equals 12 oz of beer (355 mL), 5  oz of wine (148 mL), or 1 oz of hard liquor (44 mL).  Keep all follow-up visits as told by your doctor. This is important. If you have diabetes:   Follow your diabetes care plan as told by your doctor. Make sure you: ? Know the signs of low blood sugar. ? Take your medicines as told. ? Follow your exercise and meal plan. ? Eat on time. Do not skip meals. ? Check your blood sugar as often as told by your doctor. Always check it before and after exercise. ? Follow your sick day plan when you cannot eat or drink normally. Make this plan ahead of time with your doctor.  Share your diabetes care plan with: ? Your work  or school. ? People you live with.  Check your pee (urine) for ketones: ? When you are sick. ? As told by your doctor.  Carry a card or wear jewelry that says you have diabetes. Contact a doctor if:  You have trouble keeping your blood sugar in your target range.  You have low blood sugar often. Get help right away if:  You still have symptoms after you eat or drink something sugary.  Your blood sugar is at or below 54 mg/dL (3 mmol/L).  You have jerky movements that you cannot control.  You pass out. These symptoms may be an emergency. Do not wait to see if the symptoms will go away. Get medical help right away. Call your local emergency services (911 in the [place] Do not drive yourself to the hospital. Summary  Hypoglycemia happens when the level of sugar (glucose) in your blood is too low.  Low blood sugar can happen to people who have diabetes and people who do not have diabetes. Low blood sugar can happen quickly, and it can be an emergency.  Make sure you know the signs of low blood sugar and know how to treat it.  Always keep a source of sugar (fast-acting carb) with you to treat low blood sugar. This information is not intended to replace advice given to you by your health care provider. Make sure you discuss any questions you have with your health care provider. Document Released: 01/24/2010 Document Revised: 02/20/2019 Document Reviewed: 12/03/2015 Elsevier Patient Education  2020 [employer].

## 2019-07-15 NOTE — Assessment & Plan Note (Signed)
Bleeds intermittently with hard stool. Continue Miralax. Consider GI referral if it worsens. She agreed with the plan.

## 2019-07-15 NOTE — Telephone Encounter (Signed)
UA discussed with her. Urobilinogen is neg. Still small bilirubin in urine and small blood.   Her liver enzymes are normal,hence reassuring. This could be due to kidney stone.  She mentioned over the telephone that she had right flank pain ongoing for sometime on and off.   Her urine is positive for calcium oxalate.  I recommended increase hydration and Tylenol as needed for pain.  She will contact me soon if symptoms does not get better.

## 2019-07-15 NOTE — Assessment & Plan Note (Signed)
Elevated urobilinogen. Cmet looks fine. Repeat UA.

## 2019-07-15 NOTE — Assessment & Plan Note (Signed)
Diastolic BP mildly elevated. We will watch.

## 2019-07-15 NOTE — Progress Notes (Signed)
Subjective:     Patient ID: Tanya Nolan, female   DOB: Sep 04, 1976, 43 y.o.   MRN: [id number]  HPI DM2: Taking Humalog 25 unit TID instead of 20 units TID. She does not check CBG at [facility] because she was unable to get her glucometer to work. She did not contact her endocrinologist and does not want to go back to them. She had some episode of dizziness, otherwise, she feels well in general. [id number] with her meds, no concern today. Urobilinogen:No GU symptoms, no abdominal pain. She does have intermittent N/V for which she uses Zofran prn. No other concern. Bleeding hemorrhoid: More than a year, on and off. Occasionally her hemorrhoid will bleed especially when her stool is hard. Her most recent bleed was 1 week ago. No FHx of cancer. She uses Miralax prn for constipation which helps a lot. Smoke:She started smoking a lot more since her husband fell sick. She is working on cutting back. She tried Nicotine patch and gum in the past which was not useful. The only thing that works is cold [place].  Current Outpatient Medications on File Prior to Visit  Medication Sig Dispense Refill  . atorvastatin (LIPITOR) 80 MG tablet Take 1 tablet (80 mg total) by mouth daily. 90 tablet 1  . enalapril (VASOTEC) 10 MG tablet Take 1 tablet (10 mg total) by mouth daily. 90 tablet 1  . insulin lispro (HUMALOG KWIKPEN) 100 UNIT/ML KwikPen Inject 0.2 mLs (20 Units total) into the skin 3 (three) times daily with meals. 30 mL 1  . levothyroxine (SYNTHROID) 125 MCG tablet Take 1 tablet (125 mcg total) by mouth daily before breakfast. Reported on 02/23/2016 90 tablet 1  . ondansetron (ZOFRAN-ODT) 4 MG disintegrating tablet Take 1 tablet (4 mg total) by mouth every 8 (eight) hours as needed for nausea or vomiting. 20 tablet 0  . polyethylene glycol powder (GLYCOLAX/MIRALAX) 17 GM/SCOOP powder Take 17 g by mouth daily as needed. 3350 g 1  . albuterol (PROVENTIL HFA;VENTOLIN HFA) 108 (90 Base) MCG/ACT inhaler Inhale 2  puffs into the lungs every 6 (six) hours as needed for wheezing or shortness of breath. (Patient not taking: Reported on 06/20/2019) 1 Inhaler 0  . BD ULTRA-FINE PEN NEEDLES 29G X 12.7MM MISC USE THREE TIMES A DAY BEFORE MEALS 1 each 2  . cetirizine (ZYRTEC) 10 MG tablet Take 1 tablet (10 mg total) by mouth daily. (Patient not taking: Reported on 06/20/2019) 30 tablet 11  . insulin NPH Human (HUMULIN N,NOVOLIN N) 100 UNIT/ML injection Inject into the skin.    . meloxicam (MOBIC) 15 MG tablet Take 1 tablet (15 mg total) by mouth daily as needed. (Patient not taking: Reported on 06/20/2019) 30 tablet 1  . [DISCONTINUED] insulin lispro protamine-insulin lispro (HUMALOG 75/25) (75-25) 100 UNIT/ML SUSP Inject 40-50 Units into the skin 2 (two) times daily with a meal. Patient takes 40 units in the morning and 50 units at night.  Verified it is Humalog 75/25.     No current facility-administered medications on file prior to visit.    Past Medical History:  Diagnosis Date  . Anxiety   . BACK STRAIN, LUMBAR 03/26/2009   Qualifier: Diagnosis of  By: Jerline Pain MD, Anderson Malta    . Chalazion 10/01/2007   Qualifier: Diagnosis of  By: Genene Churn MD, Janett Billow    . Chronic back pain    tx with ibuprofen  . Diabetes mellitus   . Hypercholesteremia   . Hyperlipidemia    no meds - tx with  diet  . Hypertension   . Hypothyroidism   . Ingrown toenail 12/25/2014  . 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   C&B WNL  NEG PAPS after  . Nipple discharge 10/13/2013  . Non compliance w medication regimen 10/13/2013  . 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 Thyroid dysfunction    Vitals:   07/15/19 0838  BP: 132/90  Pulse: 86  SpO2: 99%     Review of Systems  Respiratory: Negative.   Cardiovascular: Negative.   Gastrointestinal: Negative.   Musculoskeletal: Negative.   Neurological: Negative.   All other systems reviewed and are negative.      Objective:   Physical  Exam Vitals signs and nursing note reviewed.  Constitutional:      Appearance: Normal appearance.  Cardiovascular:     Rate and Rhythm: Normal rate and regular rhythm.     Pulses: Normal pulses.     Heart sounds: Normal heart sounds. No murmur. No friction rub.  Pulmonary:     Effort: Pulmonary effort is normal. No respiratory distress.     Breath sounds: Normal breath sounds. No wheezing or rhonchi.  Abdominal:     General: Abdomen is flat. Bowel sounds are normal. There is no distension.     Palpations: Abdomen is soft. There is no mass.     Tenderness: There is no abdominal tenderness. There is no guarding.  Musculoskeletal: Normal range of motion.     Right lower leg: No edema.     Left lower leg: No edema.  Neurological:     Mental Status: She is alert.        Assessment:     DM2 HTN Urobilinogen Hemorrhoid Smoking    Plan:     Check problem list. Declined flu shot today.  Mammogram discussed. She will like to start screening.

## 2019-07-15 NOTE — Assessment & Plan Note (Addendum)
CBG looks good today. Patient will like me to manage her DM and prefers not to get back on insulin pump. She stated that she did well on Lantus on the past. I reviewed her record and she was on Lantus 75 units qd. Plan to start her today on Lantus 15 units qd. Reduce Humalog to 5 units TID. Return in 1-2 weeks for med adjustment. Continue home CBG monitoring. I escribed a new meter for her. Referral to an ophthalmologist for eye exam.

## 2019-07-15 NOTE — Assessment & Plan Note (Signed)
Counseling provided. She prefers no pharmacologic agent at this time. I will continue to follow.

## 2019-07-22 ENCOUNTER — Other Ambulatory Visit: Payer: Self-pay | Admitting: Family Medicine

## 2019-07-22 ENCOUNTER — Telehealth: Payer: Self-pay

## 2019-07-22 MED ORDER — ACCU-CHEK GUIDE W/DEVICE KIT: 1.0000 | PACK | Freq: Three times a day (TID) | 0 refills | Status: AC

## 2019-07-22 MED ORDER — GLUCOSE BLOOD VI STRP: ORAL_STRIP | 12 refills | Status: DC

## 2019-07-22 MED ORDER — ACCU-CHEK FASTCLIX LANCETS MISC: 12 refills | Status: AC

## 2019-07-22 NOTE — Telephone Encounter (Signed)
Done

## 2019-07-22 NOTE — Telephone Encounter (Signed)
Received fax from pharmacy stating Freestyle monitoring kit has been discontinued. Please send in new rx for accu chek guide, fast clix lancets, and accu chek test strips. 

## 2019-08-01 ENCOUNTER — Encounter: Payer: Self-pay | Admitting: Family Medicine

## 2019-08-01 ENCOUNTER — Ambulatory Visit (INDEPENDENT_AMBULATORY_CARE_PROVIDER_SITE_OTHER): Payer: No Typology Code available for payment source | Admitting: Family Medicine

## 2019-08-01 ENCOUNTER — Other Ambulatory Visit: Payer: Self-pay

## 2019-08-01 VITALS — BP 160/90 | HR 84 | Ht 68.0 in | Wt 228.0 lb

## 2019-08-01 DIAGNOSIS — E1165 Type 2 diabetes mellitus with hyperglycemia: Secondary | ICD-10-CM | POA: Diagnosis not present

## 2019-08-01 DIAGNOSIS — E785 Hyperlipidemia, unspecified: Secondary | ICD-10-CM

## 2019-08-01 DIAGNOSIS — Z23 Encounter for immunization: Secondary | ICD-10-CM

## 2019-08-01 DIAGNOSIS — I10 Essential (primary) hypertension: Secondary | ICD-10-CM | POA: Diagnosis not present

## 2019-08-01 DIAGNOSIS — L309 Dermatitis, unspecified: Secondary | ICD-10-CM | POA: Insufficient documentation

## 2019-08-01 LAB — GLUCOSE, POCT (MANUAL RESULT ENTRY): POC Glucose: 142 mg/dl — AB (ref 70–99)

## 2019-08-01 MED ORDER — TRIAMCINOLONE ACETONIDE 0.5 % EX OINT: 1.0000 "application " | TOPICAL_OINTMENT | Freq: Two times a day (BID) | CUTANEOUS | 0 refills | Status: DC

## 2019-08-01 NOTE — Assessment & Plan Note (Signed)
Off meds for 1 week. Advised to restart.

## 2019-08-01 NOTE — Patient Instructions (Signed)
It was nice seeing you today. Your glucose has improved, I am excited. Please check your glucose at home three times daily and return to in 4 weeks with your log. Please come in sooner if the numbers at too high or too low.  Please start all other medications.

## 2019-08-01 NOTE — Assessment & Plan Note (Signed)
BP elevated. SHe is currently off her meds. Advised to restart meds as soon as possible. She agreed with the plan.

## 2019-08-01 NOTE — Assessment & Plan Note (Signed)
Etiology unclear. ?? COntact dermatitis. Triamcinolone prescribed. Monitor for improvement.

## 2019-08-01 NOTE — Assessment & Plan Note (Addendum)
CBG in the 140s during her visit today. Great improvement. Continue current insulin regimen. I encouraged her to please pick up her Glucometer to check her CBG TID. F?U in about 4 weeks or sooner if glucose is off range. Call ophthalmologist for eye exam.

## 2019-08-01 NOTE — Progress Notes (Signed)
Subjective:     Patient ID: Tanya Nolan, female   DOB: 1976-02-02, 43 y.o.   MRN: [id number]  HPI DM2:Compliant with Lantus 15 units qd and Humalog 5 units TID. She has not been checking her CBG. She is yet to pick up her glucometer. She denies hypoglycemic symptoms. She received a letter for Ophthalmology referral, she is yet to schedule her appointment. HTN/HLD:Off meds for 1 week since she was with her inlaw. Flank pain:Resolved. Rash: C/O itchy rash on her right thigh x 1 week. She denies change in diet, lotion, no known contact. Rash is dark brown and itchy. She has similar rash on her back.  Current Outpatient Medications on File Prior to Visit  Medication Sig Dispense Refill  . Insulin Glargine (LANTUS) 100 UNIT/ML Solostar Pen Inject 15 Units into the skin every morning. 30 mL 1  . insulin lispro (HUMALOG KWIKPEN) 100 UNIT/ML KwikPen Inject 0.05 mLs (5 Units total) into the skin 3 (three) times daily. 30 mL 1  . Accu-Chek FastClix Lancets MISC Use to check CBG TID 100 each 12  . albuterol (PROVENTIL HFA;VENTOLIN HFA) 108 (90 Base) MCG/ACT inhaler Inhale 2 puffs into the lungs every 6 (six) hours as needed for wheezing or shortness of breath. (Patient not taking: Reported on 06/20/2019) 1 Inhaler 0  . atorvastatin (LIPITOR) 80 MG tablet Take 1 tablet (80 mg total) by mouth daily. (Patient not taking: Reported on 08/01/2019) 90 tablet 1  . BD ULTRA-FINE PEN NEEDLES 29G X 12.7MM MISC USE THREE TIMES A DAY BEFORE MEALS 1 each 2  . Blood Glucose Monitoring Suppl (ACCU-CHEK GUIDE) w/Device KIT 1 applicator by Does not apply route 3 (three) times daily. Use to check CBG TID 1 kit 0  . cetirizine (ZYRTEC) 10 MG tablet Take 1 tablet (10 mg total) by mouth daily. (Patient not taking: Reported on 06/20/2019) 30 tablet 11  . enalapril (VASOTEC) 10 MG tablet Take 1 tablet (10 mg total) by mouth daily. (Patient not taking: Reported on 08/01/2019) 90 tablet 1  . glucose blood test strip Use to check GBG  TID 100 each 12  . levothyroxine (SYNTHROID) 125 MCG tablet Take 1 tablet (125 mcg total) by mouth daily before breakfast. Reported on 02/23/2016 (Patient not taking: Reported on 08/01/2019) 90 tablet 1  . meloxicam (MOBIC) 15 MG tablet Take 1 tablet (15 mg total) by mouth daily as needed. (Patient not taking: Reported on 06/20/2019) 30 tablet 1  . ondansetron (ZOFRAN-ODT) 4 MG disintegrating tablet Take 1 tablet (4 mg total) by mouth every 8 (eight) hours as needed for nausea or vomiting. (Patient not taking: Reported on 08/01/2019) 20 tablet 0  . polyethylene glycol powder (GLYCOLAX/MIRALAX) 17 GM/SCOOP powder Take 17 g by mouth daily as needed. (Patient not taking: Reported on 08/01/2019) 3350 g 1  . [DISCONTINUED] insulin lispro protamine-insulin lispro (HUMALOG 75/25) (75-25) 100 UNIT/ML SUSP Inject 40-50 Units into the skin 2 (two) times daily with a meal. Patient takes 40 units in the morning and 50 units at night.  Verified it is Humalog 75/25.     No current facility-administered medications on file prior to visit.    Past Medical History:  Diagnosis Date  . Anxiety   . BACK STRAIN, LUMBAR 03/26/2009   Qualifier: Diagnosis of  By: Jerline Pain MD, Anderson Malta    . Chalazion 10/01/2007   Qualifier: Diagnosis of  By: Genene Churn MD, Janett Billow    . Chronic back pain    tx with ibuprofen  . Diabetes mellitus   .  Hypercholesteremia   . Hyperlipidemia    no meds - tx with diet  . Hypertension   . Hypothyroidism   . Ingrown toenail 12/25/2014  . 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   C&B WNL  NEG PAPS after  . Nipple discharge 10/13/2013  . Non compliance w medication regimen 10/13/2013  . 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 Thyroid dysfunction    Vitals:   08/01/19 1145  BP: (!) 160/90  Pulse: 84  SpO2: 99%  Weight: 228 lb (103.4 kg)  Height: '5\' 8"'  (1.727 m)     Review of Systems  Respiratory: Negative.   Cardiovascular: Negative.    Gastrointestinal: Negative.   Genitourinary: Negative.   Skin: Positive for rash.  All other systems reviewed and are negative.      Objective:   Physical Exam Vitals signs and nursing note reviewed.  Cardiovascular:     Rate and Rhythm: Normal rate and regular rhythm.     Heart sounds: Normal heart sounds.  Pulmonary:     Effort: Pulmonary effort is normal. No respiratory distress.     Breath sounds: Normal breath sounds. No stridor. No wheezing or rhonchi.  Abdominal:     General: Abdomen is flat. Bowel sounds are normal. There is no distension.     Palpations: There is no mass.     Tenderness: There is no abdominal tenderness.  Musculoskeletal:     Right lower leg: No edema.     Left lower leg: No edema.  Skin:    Comments: Scaly, hyperpigmented macular rash on her right thigh laterally and few inches below her hip joint. Measures about 2 cm by 3 cm circumferentially.  Similar rash but lighter on her mid-upper back  Neurological:     Mental Status: She is alert.        Assessment:     DM HTN HLD Dermatitis    Plan:     Check problem list.

## 2019-08-05 ENCOUNTER — Encounter: Payer: Self-pay | Admitting: Gynecology

## 2019-09-22 ENCOUNTER — Other Ambulatory Visit: Payer: Self-pay

## 2019-09-22 ENCOUNTER — Telehealth (INDEPENDENT_AMBULATORY_CARE_PROVIDER_SITE_OTHER): Payer: No Typology Code available for payment source | Admitting: Family Medicine

## 2019-09-22 DIAGNOSIS — B349 Viral infection, unspecified: Secondary | ICD-10-CM | POA: Diagnosis not present

## 2019-09-22 NOTE — Progress Notes (Signed)
[place] [address]  T- N/A  BP- N/A  WT- N/A

## 2019-09-22 NOTE — Progress Notes (Signed)
[facility] Telemedicine Visit  Patient consented to have virtual visit. Method of visit: Video  Encounter participants: Patient: Tanya Nolan - located in care Provider: Rory Percy - located at [facility] Others (if applicable): n/a  Chief Complaint: COVID symptoms  HPI:  Patient endorses sore throat, slightly productive cough, chills, intermittent nose bleeds, dizziness and episodes of eye crusting since Saturday. Reports several sick contacts including niece since last weekend and few coworkers that tested positive for [place]. She has not checked her temperature since being out of work since Thursday. She denies chest pain or difficulties breathing. She has been taking OTC cough and cold medication with some relief. She has not been checking her blood sugars but reports compliance with home insulin. She does note some decrease in appetite with few episodes of vomiting. She is still urinating ok. She was tested for COVID on Wednesday at work but has not heard back about the results yet. She requests a note for work.   ROS: per HPI  Pertinent PMHx: HTN, hypothyroidism, DM2, PCOS, HLD, tobacco use  Exam:  Respiratory: speaks in full sentences, no respiratory distress. Frequent coughing during exam.  Assessment/Plan:  Viral illness 3 day h/o upper respiratory symptoms with associated nausea, decreased appetite and few episodes of vomiting. Symptoms certainly concerning for possible COVID-19 infection given many sick contacts with few testing positive for COVID. Patient already tested for COVID and awaiting results. Instructed patient to call work if not heard of results by tomorrow. Instructed patient to self-isolate and socially distance to avoid spread. Emergency precautions discussed including difficulties breathing or chest pain. Encouraged patient to check blood sugars given decreased PO intake and currently on insulin, suspect dizziness and nausea could be from  hypoglycemia given she is still on home dose of insulin. Instructed patient to call clinic with decreased CBG measurements for possible titration of insulin. Note provided for work.     Time spent during visit with patient: 11 minutes

## 2019-09-23 DIAGNOSIS — B349 Viral infection, unspecified: Secondary | ICD-10-CM | POA: Insufficient documentation

## 2019-09-23 NOTE — Assessment & Plan Note (Signed)
3 day h/o upper respiratory symptoms with associated nausea, decreased appetite and few episodes of vomiting. Symptoms certainly concerning for possible COVID-19 infection given many sick contacts with few testing positive for COVID. Patient already tested for COVID and awaiting results. Instructed patient to call work if not heard of results by tomorrow. Instructed patient to self-isolate and socially distance to avoid spread. Emergency precautions discussed including difficulties breathing or chest pain. Encouraged patient to check blood sugars given decreased PO intake and currently on insulin, suspect dizziness and nausea could be from hypoglycemia given she is still on home dose of insulin. Instructed patient to call clinic with decreased CBG measurements for possible titration of insulin. Note provided for work.

## 2019-09-23 NOTE — Progress Notes (Signed)
Letter printed and placed up front. Tanya Nolan,CMA  

## 2019-09-27 ENCOUNTER — Encounter (HOSPITAL_COMMUNITY): Payer: Self-pay | Admitting: Emergency Medicine

## 2019-09-27 ENCOUNTER — Other Ambulatory Visit: Payer: Self-pay

## 2019-09-27 ENCOUNTER — Emergency Department (HOSPITAL_COMMUNITY)
Admission: EM | Admit: 2019-09-27 | Discharge: 2019-09-27 | Disposition: A | Discharge status: Home / Self Care | Payer: No Typology Code available for payment source | Attending: Emergency Medicine | Admitting: Emergency Medicine

## 2019-09-27 DIAGNOSIS — E039 Hypothyroidism, unspecified: Secondary | ICD-10-CM | POA: Insufficient documentation

## 2019-09-27 DIAGNOSIS — E119 Type 2 diabetes mellitus without complications: Secondary | ICD-10-CM | POA: Insufficient documentation

## 2019-09-27 DIAGNOSIS — Z794 Long term (current) use of insulin: Secondary | ICD-10-CM | POA: Diagnosis not present

## 2019-09-27 DIAGNOSIS — J069 Acute upper respiratory infection, unspecified: Secondary | ICD-10-CM

## 2019-09-27 DIAGNOSIS — F17210 Nicotine dependence, cigarettes, uncomplicated: Secondary | ICD-10-CM | POA: Diagnosis not present

## 2019-09-27 DIAGNOSIS — I10 Essential (primary) hypertension: Secondary | ICD-10-CM | POA: Insufficient documentation

## 2019-09-27 DIAGNOSIS — R0981 Nasal congestion: Secondary | ICD-10-CM | POA: Diagnosis not present

## 2019-09-27 DIAGNOSIS — R05 Cough: Secondary | ICD-10-CM | POA: Insufficient documentation

## 2019-09-27 DIAGNOSIS — Z20828 Contact with and (suspected) exposure to other viral communicable diseases: Secondary | ICD-10-CM | POA: Diagnosis not present

## 2019-09-27 DIAGNOSIS — Z79899 Other long term (current) drug therapy: Secondary | ICD-10-CM | POA: Insufficient documentation

## 2019-09-27 DIAGNOSIS — J029 Acute pharyngitis, unspecified: Secondary | ICD-10-CM | POA: Diagnosis present

## 2019-09-27 LAB — SARS CORONAVIRUS 2 (TAT 6-24 HRS): SARS Coronavirus 2: NEGATIVE

## 2019-09-27 LAB — INFLUENZA PANEL BY PCR (TYPE A & B)
Influenza A By PCR: NEGATIVE
Influenza B By PCR: NEGATIVE

## 2019-09-27 LAB — GROUP A STREP BY PCR: Group A Strep by PCR: NOT DETECTED

## 2019-09-27 MED ORDER — GUAIFENESIN ER 1200 MG PO TB12: 1.0000 | ORAL_TABLET | Freq: Two times a day (BID) | ORAL | 0 refills | Status: DC

## 2019-09-27 MED ORDER — PROMETHAZINE-DM 6.25-15 MG/5ML PO SYRP: 5.0000 mL | ORAL_SOLUTION | Freq: Four times a day (QID) | ORAL | 0 refills | Status: DC | PRN

## 2019-09-27 NOTE — ED Notes (Signed)
Patient verbalizes understanding of discharge instructions. Opportunity for questioning and answers were provided. Armband removed by staff, pt discharged from ED.  

## 2019-09-27 NOTE — Discharge Instructions (Addendum)
Your strep and flu test were negative.  I would recommend quarantining until your Covid test is returned.  Tylenol for any pain.  Increase your fluid intake.  Return here for any worsening in your condition.

## 2019-09-27 NOTE — ED Provider Notes (Addendum)
[facility] EMERGENCY DEPARTMENT Provider Note   CSN: [id number] Arrival date & time: 09/27/19  0740     History   Chief Complaint Chief Complaint  Patient presents with  . Sore Throat    HPI Tanya Nolan is a 43 y.o. female.     HPI Patient presents to the emergency department with 7-day history of sore throat, cough, nasal congestion.  The patient states that her sore throat seems to be fairly prominent.  The patient states that she did not take any medications prior to arrival for her symptoms.  Patient states she was seen in ED visit during the week as well.  Patient states that she did not have any testing as a result of that.  The patient states that nothing seems to make the condition better or worse.  The patient denies chest pain, shortness of breath, headache,blurred vision, neck pain, fever, weakness, numbness, dizziness, anorexia, edema, abdominal pain, nausea, vomiting, diarrhea, rash, back pain, dysuria, hematemesis, bloody stool, near syncope, or syncope. Past Medical History:  Diagnosis Date  . Anxiety   . BACK STRAIN, LUMBAR 03/26/2009   Qualifier: Diagnosis of  By: Jerline Pain MD, Anderson Malta    . Chalazion 10/01/2007   Qualifier: Diagnosis of  By: Genene Churn MD, Janett Billow    . Chronic back pain    tx with ibuprofen  . Diabetes mellitus   . Hypercholesteremia   . Hyperlipidemia    no meds - tx with diet  . Hypertension   . Hypothyroidism   . Ingrown toenail 12/25/2014  . 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   C&B WNL  NEG PAPS after  . Nipple discharge 10/13/2013  . Non compliance w medication regimen 10/13/2013  . 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 Thyroid dysfunction     Patient Active Problem List   Diagnosis Date Noted  . Viral illness 09/23/2019  . Dermatitis 08/01/2019  . Abnormal urinalysis 07/15/2019  . Tobacco abuse 07/15/2019  . Chronic back pain 09/19/2016  . Hemorrhoids  02/02/2012  . Abdominal pain 03/26/2009  . Hypothyroidism 01/10/2007  . DM (diabetes mellitus), type 2, uncontrolled ([place]) 01/10/2007  . POLYCYSTIC OVARY 01/10/2007  . Hyperlipidemia 01/10/2007  . HYPERTENSION, BENIGN SYSTEMIC 01/10/2007    Past Surgical History:  Procedure Laterality Date  . [place] DECOMPRESSION  2008  . HYSTEROSCOPY X 2  2007 / 2012   ENDOMETRIAL POLYPS REMOVED     OB History    Gravida  0   Para      Term      Preterm      AB      Living        SAB      TAB      Ectopic      Multiple      Live Births               Home Medications    Prior to Admission medications   Medication Sig Start Date End Date Taking? Authorizing Provider  Accu-Chek FastClix Lancets MISC Use to check CBG TID 07/22/19   [name], MD  albuterol (PROVENTIL HFA;VENTOLIN HFA) 108 (90 Base) MCG/ACT inhaler Inhale 2 puffs into the lungs every 6 (six) hours as needed for wheezing or shortness of breath. Patient not taking: Reported on 06/20/2019 03/10/16   Leeanne Rio, MD  atorvastatin (LIPITOR) 80 MG tablet Take 1 tablet (80 mg total) by mouth daily. Patient not  taking: Reported on 08/01/2019 06/20/19   [name], MD  BD ULTRA-FINE PEN NEEDLES 29G X 12.7MM MISC USE THREE TIMES A DAY BEFORE MEALS 06/10/13   Andrena Mews T, MD  Blood Glucose Monitoring Suppl (ACCU-CHEK GUIDE) w/Device KIT 1 applicator by Does not apply route 3 (three) times daily. Use to check CBG TID 07/22/19   [name], MD  cetirizine (ZYRTEC) 10 MG tablet Take 1 tablet (10 mg total) by mouth daily. Patient not taking: Reported on 06/20/2019 06/14/16   Tonette Bihari, MD  enalapril (VASOTEC) 10 MG tablet Take 1 tablet (10 mg total) by mouth daily. Patient not taking: Reported on 08/01/2019 06/20/19   Andrena Mews T, MD  glucose blood test strip Use to check GBG TID 07/22/19   [name], MD  Insulin Glargine (LANTUS) 100 UNIT/ML Solostar Pen Inject 15 Units into the skin  every morning. 07/15/19   [name], MD  insulin lispro (HUMALOG KWIKPEN) 100 UNIT/ML KwikPen Inject 0.05 mLs (5 Units total) into the skin 3 (three) times daily. 07/15/19   [name], MD  levothyroxine (SYNTHROID) 125 MCG tablet Take 1 tablet (125 mcg total) by mouth daily before breakfast. Reported on 02/23/2016 Patient not taking: Reported on 08/01/2019 06/20/19   [name], MD  meloxicam (MOBIC) 15 MG tablet Take 1 tablet (15 mg total) by mouth daily as needed. Patient not taking: Reported on 06/20/2019 11/14/17   [name], MD  ondansetron (ZOFRAN-ODT) 4 MG disintegrating tablet Take 1 tablet (4 mg total) by mouth every 8 (eight) hours as needed for nausea or vomiting. Patient not taking: Reported on 08/01/2019 06/20/19   Andrena Mews T, MD  polyethylene glycol powder (GLYCOLAX/MIRALAX) 17 GM/SCOOP powder Take 17 g by mouth daily as needed. Patient not taking: Reported on 08/01/2019 06/20/19   [name], MD  triamcinolone ointment (KENALOG) 0.5 % Apply 1 application topically 2 (two) times daily. 08/01/19   [name], MD  insulin lispro protamine-insulin lispro (HUMALOG 75/25) (75-25) 100 UNIT/ML SUSP Inject 40-50 Units into the skin 2 (two) times daily with a meal. Patient takes 40 units in the morning and 50 units at night.  Verified it is Humalog 75/25.  04/18/12  [provider]    Family History Family History  Problem Relation Age of Onset  . Hypertension Sister   . Lupus Mother   . Hypertension Mother   . Heart disease Father   . Gout Father   . Lupus Father     Social History Social History   Tobacco Use  . Smoking status: Current Every Day Smoker    Packs/day: 0.50    Types: Cigarettes  . Smokeless tobacco: Never Used  Substance Use Topics  . Alcohol use: Yes    Alcohol/week: 0.0 standard drinks    Comment: socially - wine/liquor--Rare  . Drug use: No    Comment: past use "years ago" per patient     Allergies   Patient  has no known allergies.   Review of Systems Review of Systems All other systems negative except as documented in the HPI. All pertinent positives and negatives as reviewed in the HPI.  Physical Exam Updated Vital Signs BP (!) 173/91 (BP Location: Left Arm)   Pulse 86   Temp 98.3 F (36.8 C) (Oral)   Resp 18   LMP 09/20/2019   SpO2 100%   Physical Exam Vitals signs and nursing note reviewed.  Constitutional:  General: She is not in acute distress.    Appearance: She is well-developed.  HENT:     Head: Normocephalic and atraumatic.     Mouth/Throat:     Pharynx: Uvula midline. Pharyngeal swelling and posterior oropharyngeal erythema present.  Eyes:     Pupils: Pupils are equal, round, and reactive to light.  Neck:     Musculoskeletal: Normal range of motion and neck supple.  Cardiovascular:     Rate and Rhythm: Normal rate and regular rhythm.     Heart sounds: Normal heart sounds. No murmur. No friction rub. No gallop.   Pulmonary:     Effort: Pulmonary effort is normal. No respiratory distress.     Breath sounds: Normal breath sounds. No wheezing.  Abdominal:     General: Bowel sounds are normal. There is no distension.     Palpations: Abdomen is soft.     Tenderness: There is no abdominal tenderness.  Skin:    General: Skin is warm and dry.     Capillary Refill: Capillary refill takes less than 2 seconds.     Findings: No erythema or rash.  Neurological:     Mental Status: She is alert and oriented to person, place, and time.     Motor: No abnormal muscle tone.     Coordination: Coordination normal.  Psychiatric:        Behavior: Behavior normal.      ED Treatments / Results  Labs (all labs ordered are listed, but only abnormal results are displayed) Labs Reviewed  GROUP A STREP BY PCR  SARS CORONAVIRUS 2 (TAT 6-24 HRS)  INFLUENZA PANEL BY PCR (TYPE A & B)    EKG None  Radiology No results found.  Procedures Procedures (including critical care  time)  Medications Ordered in ED Medications - No data to display   Initial Impression / Assessment and Plan / ED Course  I have reviewed the triage vital signs and the nursing notes.  Pertinent labs & imaging results that were available during my care of the patient were reviewed by me and considered in my medical decision making (see chart for details).       At this point the patient is flu negative and strep negative.  The patient will be presumed Covid until her test results are back.  This still could represent another URI.  The patient is in no acute distress and her vital signs of remained stable here in the emergency department.  The blood pressure is noted to be elevated and the patient states she has not taken her medications today.  Patient is advised to increase her fluid intake and rest as much as possible.  Tylenol for any pain.  Final Clinical Impressions(s) / ED Diagnoses   Final diagnoses:  None    ED Discharge Orders    None       Dalia Heading, PA-C 09/27/19 1024    Kinneth Fujiwara, [place], PA-C 09/27/19 1029    Noemi Chapel, MD 09/28/19 [phone]

## 2019-09-27 NOTE — ED Triage Notes (Addendum)
C/o sore throat x 1 week.  Pt has pending COVID test.  She has not took BP medication today.

## 2019-11-09 ENCOUNTER — Telehealth: Payer: Self-pay | Admitting: Family Medicine

## 2019-11-09 NOTE — Telephone Encounter (Signed)
**  After Hours/ Emergency Line Call**  Received a call from [name] to report that her daughter FREDDY CRUELL found her husband dead yesterday. She is requesting something for "her nerves". Given condolences. Mom reports that patient has been crying all day, but she does not think is a harm to herself at this time. Discussed crisis line and when to take her to the ED if she is worried about her safety. Otherwise, scheduled her a virtual visit for 12/28 at 9:50am. Will forward to PCP.  [place] Simrit Gohlke, DO PGY-3, [facility] 11/09/2019 1:47 PM

## 2019-11-10 ENCOUNTER — Other Ambulatory Visit: Payer: Self-pay

## 2019-11-10 ENCOUNTER — Telehealth (INDEPENDENT_AMBULATORY_CARE_PROVIDER_SITE_OTHER): Payer: No Typology Code available for payment source | Admitting: Family Medicine

## 2019-11-10 ENCOUNTER — Other Ambulatory Visit: Payer: Self-pay | Admitting: Family Medicine

## 2019-11-10 DIAGNOSIS — F4321 Adjustment disorder with depressed mood: Secondary | ICD-10-CM | POA: Insufficient documentation

## 2019-11-10 DIAGNOSIS — F430 Acute stress reaction: Secondary | ICD-10-CM

## 2019-11-10 HISTORY — DX: Adjustment disorder with depressed mood: F43.21

## 2019-11-10 MED ORDER — CLONAZEPAM 0.5 MG PO TABS: 0.5000 mg | ORAL_TABLET | Freq: Two times a day (BID) | ORAL | 0 refills | Status: DC | PRN

## 2019-11-10 NOTE — Assessment & Plan Note (Signed)
Severe grief after finding husband dead 2 days ago. H/o depression and anxiety with patient-reported BH hospitalization though unable to view those records. Given many years of stability and fairly traumatic experience, believe it is reasonable to offer short benzodiazepine course with close follow up in 1-2 weeks. Counseling resources provided. Can consider longer term SSRI at follow up if symptoms still fairly prohibitive of daily functioning. Strict return and emergency precautions provided including SI. Patient verbalized understanding and agreement with plan.

## 2019-11-10 NOTE — Progress Notes (Signed)
[facility] Telemedicine Visit  Patient consented to have virtual visit. Method of visit: Video was attempted, but technology challenges prevented patient from using video, so visit was conducted via telephone.  Encounter participants: Patient: Tanya Nolan - located at home Provider: Rory Percy - located at [facility] Others (if applicable): n/a  Chief Complaint: Grief  HPI:  Patient's mother called after hours line yesterday as patient has been suffering uncontrollable grief since finding husband dead 2 days ago. Patient states ever since she has not been eating or sleeping well with frequent crying and hyperventilating until almost passing out. She has had thoughts of cutting herself as a way to cope with her feelings but denies thoughts of suicide intent. She previously saw counseling for depression many years ago but has been stable for many years and has not seen anyone recently. Reports a prior [place] hospitalization for anxiety and SI but was many years ago. States she has never been on any long term medication for depression, anxiety. She is currently living alone but her mother lives down the street and checks on her often.  ROS: per HPI  Pertinent PMHx: HTN, hypothyroidism, DM2, tobacco use  Exam:  Respiratory: speaks in full sentences, no respiratory distress but is crying throughout the encounter Psych: crying, no flight of ideas or tangential thought process. No SI/HI.  Assessment/Plan:  Grief Severe grief after finding husband dead 2 days ago. H/o depression and anxiety with patient-reported BH hospitalization though unable to view those records. Given many years of stability and fairly traumatic experience, believe it is reasonable to offer short benzodiazepine course with close follow up in 1-2 weeks. Counseling resources provided. Can consider longer term SSRI at follow up if symptoms still fairly prohibitive of daily functioning. Strict return and  emergency precautions provided including SI. Patient verbalized understanding and agreement with plan.   Time spent during visit with patient: 16 minutes

## 2019-11-18 ENCOUNTER — Ambulatory Visit: Payer: No Typology Code available for payment source | Admitting: Licensed Clinical Social Worker

## 2019-11-18 DIAGNOSIS — Z7189 Other specified counseling: Secondary | ICD-10-CM

## 2019-11-18 DIAGNOSIS — I10 Essential (primary) hypertension: Secondary | ICD-10-CM

## 2019-11-18 DIAGNOSIS — F4321 Adjustment disorder with depressed mood: Secondary | ICD-10-CM

## 2019-11-18 NOTE — Chronic Care Management (AMB) (Signed)
Social Work Care Management  Initial Visit Note  11/18/2019 Name: Tanya Nolan MRN: [id number] DOB: Feb 16, 1976  Assessment: DRUSCILLA PETSCH is a 44 y.o. year old female who sees [name], MD for primary care. The care management team was consulted for assistance with care management and care coordination needs related to grief support.  Review of patient status, including review of consultants reports, relevant laboratory and other test results, and collaboration with appropriate care team members and the patient's provider was performed as part of comprehensive patient evaluation and provision of care management services.    SDOH (Social Determinants of Health) screening performed today: . See Care Plan for related entries.  Outpatient Encounter Medications as of 11/18/2019  Medication Sig Note  . Accu-Chek FastClix Lancets MISC Use to check CBG TID   . albuterol (PROVENTIL HFA;VENTOLIN HFA) 108 (90 Base) MCG/ACT inhaler Inhale 2 puffs into the lungs every 6 (six) hours as needed for wheezing or shortness of breath. (Patient not taking: Reported on 06/20/2019) 09/19/2016: Hasn't used in last 2 months  . atorvastatin (LIPITOR) 80 MG tablet Take 1 tablet (80 mg total) by mouth daily. (Patient not taking: Reported on 08/01/2019)   . BD ULTRA-FINE PEN NEEDLES 29G X 12.7MM MISC USE THREE TIMES A DAY BEFORE MEALS   . Blood Glucose Monitoring Suppl (ACCU-CHEK GUIDE) w/Device KIT 1 applicator by Does not apply route 3 (three) times daily. Use to check CBG TID   . cetirizine (ZYRTEC) 10 MG tablet Take 1 tablet (10 mg total) by mouth daily. (Patient not taking: Reported on 06/20/2019) 09/19/2016: Using for sleep  . clonazePAM (KLONOPIN) 0.5 MG tablet Take 1 tablet (0.5 mg total) by mouth 2 (two) times daily as needed for anxiety.   . enalapril (VASOTEC) 10 MG tablet Take 1 tablet (10 mg total) by mouth daily. (Patient not taking: Reported on 08/01/2019)   . glucose blood test strip Use to check GBG TID   .  Guaifenesin 1200 MG TB12 Take 1 tablet (1,200 mg total) by mouth 2 (two) times daily.   . Insulin Glargine (LANTUS) 100 UNIT/ML Solostar Pen Inject 15 Units into the skin every morning.   . insulin lispro (HUMALOG KWIKPEN) 100 UNIT/ML KwikPen Inject 0.05 mLs (5 Units total) into the skin 3 (three) times daily.   Marland Kitchen levothyroxine (SYNTHROID) 125 MCG tablet Take 1 tablet (125 mcg total) by mouth daily before breakfast. Reported on 02/23/2016 (Patient not taking: Reported on 08/01/2019)   . meloxicam (MOBIC) 15 MG tablet Take 1 tablet (15 mg total) by mouth daily as needed. (Patient not taking: Reported on 06/20/2019)   . ondansetron (ZOFRAN-ODT) 4 MG disintegrating tablet Take 1 tablet (4 mg total) by mouth every 8 (eight) hours as needed for nausea or vomiting. (Patient not taking: Reported on 08/01/2019)   . polyethylene glycol powder (GLYCOLAX/MIRALAX) 17 GM/SCOOP powder Take 17 g by mouth daily as needed. (Patient not taking: Reported on 08/01/2019)   . promethazine-dextromethorphan (PROMETHAZINE-DM) 6.25-15 MG/5ML syrup Take 5 mLs by mouth 4 (four) times daily as needed for cough.   . triamcinolone ointment (KENALOG) 0.5 % Apply 1 application topically 2 (two) times daily.   . [DISCONTINUED] insulin lispro protamine-insulin lispro (HUMALOG 75/25) (75-25) 100 UNIT/ML SUSP Inject 40-50 Units into the skin 2 (two) times daily with a meal. Patient takes 40 units in the morning and 50 units at night.  Verified it is Humalog 75/25. 11/02/2011: Took 5 units as order by anesthesia at 1115  11/02/2011  No facility-administered encounter medications on file as of 11/18/2019.    Goals Addressed            This Visit's Progress   . connect with counseling resources to manage grief       Current Barriers:  . Unmet grief need for patient with HTN and DMII . Patient needs support, education, and care coordination to connect to grief counseling  Clinical Goal(s)  . Over the next 20 days, patient will follow up  with counseling resources provided  Interventions :  . Assessed patient's care coordination needs  . Mailed patient information about grief support options . Collaborated with access to care provider regarding patient needs Patient Self Care Activities & Deficits:  . Patient is able to contact agencies discussed today . Patient is unable to independently navigate community resource options without care coordination support . Acknowledges deficits related to available resources  Initial goal documentation       Plan:  1.Counseling resources mailed to patient. 2. LCSW will F/U in 7 days   Casimer Lanius, [place] / [place] [facility] [place]   [phone] 4:06 PM

## 2019-11-18 NOTE — Patient Instructions (Signed)
Licensed Clinical Social Worker Visit Information Ms. Mayeda  it was nice speaking with you. Please call me directly if you have questions [phone] Goals we discussed today:  Goals Addressed            This Visit's Progress   . connect with counseling resources to manage grief       Current Barriers:  . Unmet grief need for patient with HTN and DMII . Patient needs support, education, and care coordination to connect to grief counseling  Clinical Goal(s)  . Over the next 20 days, patient will follow up with counseling resources provided  Interventions :  . Assessed patient's care coordination needs  . Mailed patient information about grief support options . Collaborated with access to care provider regarding patient needs Patient Self Care Activities & Deficits:  . Patient is able to contact agencies discussed today . Patient is unable to independently navigate community resource options without care coordination support . Acknowledges deficits related to available resources  Initial goal documentation       Materials provided: Yes: counseling resources Patient agreed to services and verbal consent obtained.    The patient verbalized understanding of instructions provided today and declined a print copy of patient instruction materials.  Follow up plan:  SW will follow up with patient by phone over the next 7 to 10 days  Maurine Cane, LCSW

## 2019-11-19 ENCOUNTER — Other Ambulatory Visit: Payer: Self-pay

## 2019-11-19 ENCOUNTER — Telehealth (INDEPENDENT_AMBULATORY_CARE_PROVIDER_SITE_OTHER): Payer: No Typology Code available for payment source | Admitting: Family Medicine

## 2019-11-19 DIAGNOSIS — F4321 Adjustment disorder with depressed mood: Secondary | ICD-10-CM | POA: Diagnosis not present

## 2019-11-19 DIAGNOSIS — E1169 Type 2 diabetes mellitus with other specified complication: Secondary | ICD-10-CM

## 2019-11-19 DIAGNOSIS — E1165 Type 2 diabetes mellitus with hyperglycemia: Secondary | ICD-10-CM

## 2019-11-19 MED ORDER — INSULIN GLARGINE 100 UNIT/ML SOLOSTAR PEN: 15.0000 [IU] | PEN_INJECTOR | SUBCUTANEOUS | 1 refills | Status: DC

## 2019-11-19 MED ORDER — SERTRALINE HCL 50 MG PO TABS: 50.0000 mg | ORAL_TABLET | Freq: Every day | ORAL | 0 refills | Status: DC

## 2019-11-19 NOTE — Assessment & Plan Note (Signed)
Is not checking CBGs regularly, has been out of lantus for a few months. Will send in refill and encouraged and educated patient on importance of checking CBGs regularly. Will recheck A1c in 2 weeks when she comes for depression f/u.

## 2019-11-19 NOTE — Assessment & Plan Note (Signed)
Improving but has resorted to cutting as impulsive coping mechanism. Sent patient alternatives to cutting such as rubber band snapping and holding ice. Given this increase in self-injurious behavior and prior h/o depression/anxiety, will start sertraline in hopes to achieve better control. Can continue prn klonopin. Again encouraged patient to establish with counseling. Will f/u in 2 weeks.

## 2019-11-19 NOTE — Progress Notes (Signed)
[facility] [facility] Telemedicine Visit  Patient consented to have virtual visit. Method of visit: Telephone  Encounter participants: Patient: Tanya Nolan - located at home Provider: Rory Percy - located at [facility] Others (if applicable): n/a  Chief Complaint: Grief f/u  HPI:  Seen previously for grief 2019-11-26 after finding husband dead 2 days prior to presentation.  Prior history of depression, anxiety and cutting though has had many years of stability off medications.  At time of presentation 1.5 weeks ago gave short benzodiazepine course with counseling resources.  Reports her symptoms are persistent but slightly improving. She is still having difficulties with sleep and eating. She is currently staying with her brother. She has not yet established with counseling but did receive resources list in the mail. She does note she continues to have thoughts of cutting and has resorted to cutting on her arms and wrists for coping and denies any intention to kill herself. Denies SI. She has been taking klonopin as needed though has not required as much this week, she is not taking it every day.  At times, she forces herself to eat. She is also diabetic and has not been checking her sugars very regularly. Last CBG that she recalls is 149. She has been out of her lantus for a few months.   ROS: per HPI  Pertinent PMHx: HTN, hypothyroidism, DM2, tobacco use  Exam:  Respiratory: speaks in full sentences, no respiratory distress  Assessment/Plan:  DM (diabetes mellitus), type 2, uncontrolled Is not checking CBGs regularly, has been out of lantus for a few months. Will send in refill and encouraged and educated patient on importance of checking CBGs regularly. Will recheck A1c in 2 weeks when she comes for depression f/u.  Grief Improving but has resorted to cutting as impulsive coping mechanism. Sent patient alternatives to cutting such as rubber band snapping and holding  ice. Given this increase in self-injurious behavior and prior h/o depression/anxiety, will start sertraline in hopes to achieve better control. Can continue prn klonopin. Again encouraged patient to establish with counseling. Will f/u in 2 weeks.    Time spent during visit with patient: 14 minutes

## 2019-11-20 ENCOUNTER — Telehealth: Payer: Self-pay | Admitting: *Deleted

## 2019-11-20 ENCOUNTER — Other Ambulatory Visit: Payer: Self-pay | Admitting: Family Medicine

## 2019-11-20 MED ORDER — BASAGLAR KWIKPEN 100 UNIT/ML ~~LOC~~ SOPN: 15.0000 [IU] | PEN_INJECTOR | SUBCUTANEOUS | 11 refills | Status: DC

## 2019-11-20 NOTE — Telephone Encounter (Signed)
Sent basaglar to pharmacy.

## 2019-11-20 NOTE — Telephone Encounter (Signed)
Fax received from pharmacy stating that Lantus for this patient is not covered.  Will forward to Dr. Ky Barban who saw patient for this visit and ask her to change to a different medication.  Orra Nolde,CMA

## 2019-11-27 ENCOUNTER — Other Ambulatory Visit: Payer: Self-pay

## 2019-11-27 ENCOUNTER — Telehealth: Payer: No Typology Code available for payment source

## 2019-11-27 ENCOUNTER — Ambulatory Visit: Payer: Self-pay | Admitting: Licensed Clinical Social Worker

## 2019-11-27 NOTE — Chronic Care Management (AMB) (Signed)
    Clinical Social Work  Care Management Outreach   11/27/2019 Name: SHANTERRICA BLESSINGER MRN: [id number] DOB: 1976-05-08  SELAMAWIT EBEL is a 44 y.o. year old female who is a primary care patient of [place], Phill Myron, MD . LCSW was consulted for assistance with Mental Health Counseling and Grief Resources.   LCSW reached out to [name] today by phone to assess needs and to see if she was able to connect to resources received during office visit with provider. The outreach was unsuccessful. A HIPPA compliant phone message was left for the patient providing contact information and requesting a return call.   Plan: LCSW will wait for return call.  Patient has follow up appointment scheduled in clinic on Jan. 19th with Dr. Ky Barban.  Please consult LCSW if needed during this visit.  Casimer Lanius, LCSW Clinical Social Worker [place] / [place]   [phone] 1:57 PM

## 2019-12-02 ENCOUNTER — Ambulatory Visit: Payer: No Typology Code available for payment source | Admitting: Family Medicine

## 2019-12-09 ENCOUNTER — Ambulatory Visit: Payer: No Typology Code available for payment source | Admitting: Family Medicine

## 2019-12-09 NOTE — Progress Notes (Deleted)
  Subjective:   Patient ID: Tanya Nolan    DOB: [birth year], 44 y.o. female   MRN: [id number]  Tanya Nolan is a 44 y.o. female with a history of HTN, hypothyroidism, DM2, HLD, tobacco use, depression/grief here for   Grief, h/o depression - Medications: zoloft 50mg  daily, klonopin 0.5mg  BID PRN - Taking: *** - Current stressors: ***recent unexpected death of husband - Coping Mechanisms: ***  ***cutting?  Diabetes, Type 2 - Last A1c 11.1 06/2019 - Medications: basaglar 15u daily, humalog 5u TID - Compliance: *** - Checking BG at home: *** - Eye exam: ordered - Foot exam: UTD - Microalbumin: due - Statin: ***yes - Denies symptoms of hypoglycemia, polyuria, polydipsia, numbness extremities, foot ulcers/trauma   Review of Systems:  Per HPI.  Medications and smoking status reviewed.  Objective:   There were no vitals taken for this visit. Vitals and nursing note reviewed.  General: well nourished, well developed, in no acute distress with non-toxic appearance HEENT: normocephalic, atraumatic, moist mucous membranes Neck: supple, non-tender without lymphadenopathy CV: regular rate and rhythm without murmurs, rubs, or gallops, no lower extremity edema Lungs: clear to auscultation bilaterally with normal work of breathing Abdomen: soft, non-tender, non-distended, no masses or organomegaly palpable, normoactive bowel sounds Skin: warm, dry, no rashes or lesions Extremities: warm and well perfused, normal tone MSK: ROM grossly intact, gait normal Neuro: Alert and oriented, speech normal  Assessment & Plan:   No problem-specific Assessment & Plan notes found for this encounter.  No orders of the defined types were placed in this encounter.  No orders of the defined types were placed in this encounter.   Rory Percy, DO PGY-3, [facility] 12/09/2019 12:14 PM

## 2020-03-14 ENCOUNTER — Emergency Department (HOSPITAL_COMMUNITY)
Admission: EM | Admit: 2020-03-14 | Discharge: 2020-03-15 | Disposition: A | Discharge status: Other Acute Inpatient Hospital | Payer: Medicaid Other | Attending: Emergency Medicine | Admitting: Emergency Medicine

## 2020-03-14 ENCOUNTER — Other Ambulatory Visit: Payer: Self-pay

## 2020-03-14 ENCOUNTER — Emergency Department (HOSPITAL_COMMUNITY): Payer: Medicaid Other

## 2020-03-14 ENCOUNTER — Encounter (HOSPITAL_COMMUNITY): Payer: Self-pay | Admitting: Obstetrics and Gynecology

## 2020-03-14 DIAGNOSIS — F332 Major depressive disorder, recurrent severe without psychotic features: Secondary | ICD-10-CM | POA: Diagnosis not present

## 2020-03-14 DIAGNOSIS — F17210 Nicotine dependence, cigarettes, uncomplicated: Secondary | ICD-10-CM | POA: Diagnosis not present

## 2020-03-14 DIAGNOSIS — Z0489 Encounter for examination and observation for other specified reasons: Secondary | ICD-10-CM | POA: Diagnosis present

## 2020-03-14 DIAGNOSIS — E039 Hypothyroidism, unspecified: Secondary | ICD-10-CM | POA: Diagnosis not present

## 2020-03-14 DIAGNOSIS — Z20822 Contact with and (suspected) exposure to covid-19: Secondary | ICD-10-CM | POA: Insufficient documentation

## 2020-03-14 DIAGNOSIS — F32A Depression, unspecified: Secondary | ICD-10-CM

## 2020-03-14 DIAGNOSIS — Z794 Long term (current) use of insulin: Secondary | ICD-10-CM | POA: Insufficient documentation

## 2020-03-14 DIAGNOSIS — R45851 Suicidal ideations: Secondary | ICD-10-CM | POA: Diagnosis not present

## 2020-03-14 DIAGNOSIS — I10 Essential (primary) hypertension: Secondary | ICD-10-CM | POA: Diagnosis not present

## 2020-03-14 DIAGNOSIS — F329 Major depressive disorder, single episode, unspecified: Secondary | ICD-10-CM

## 2020-03-14 DIAGNOSIS — Z79899 Other long term (current) drug therapy: Secondary | ICD-10-CM | POA: Diagnosis not present

## 2020-03-14 DIAGNOSIS — E119 Type 2 diabetes mellitus without complications: Secondary | ICD-10-CM | POA: Insufficient documentation

## 2020-03-14 LAB — COMPREHENSIVE METABOLIC PANEL
ALT: 14 U/L (ref 0–44)
AST: 21 U/L (ref 15–41)
Albumin: 4 g/dL (ref 3.5–5.0)
Alkaline Phosphatase: 64 U/L (ref 38–126)
Anion gap: 9 (ref 5–15)
BUN: 10 mg/dL (ref 6–20)
CO2: 23 mmol/L (ref 22–32)
Calcium: 9.3 mg/dL (ref 8.9–10.3)
Chloride: 109 mmol/L (ref 98–111)
Creatinine, Ser: 0.86 mg/dL (ref 0.44–1.00)
GFR calc Af Amer: >60 mL/min (ref >60)
GFR calc non Af Amer: >60 mL/min (ref >60)
Glucose, Bld: 163 mg/dL — ABNORMAL HIGH (ref 70–99)
Potassium: 3.6 mmol/L (ref 3.5–5.1)
Sodium: 141 mmol/L (ref 135–145)
Total Bilirubin: 0.3 mg/dL (ref 0.3–1.2)
Total Protein: 7.7 g/dL (ref 6.5–8.1)

## 2020-03-14 LAB — ETHANOL: Alcohol, Ethyl (B): <10 mg/dL (ref <10)

## 2020-03-14 LAB — CBC WITH DIFFERENTIAL/PLATELET
Abs Immature Granulocytes: 0.04 10*3/uL (ref 0.00–0.07)
Basophils Absolute: 0.1 10*3/uL (ref 0.0–0.1)
Basophils Relative: 0 %
Eosinophils Absolute: 0.1 10*3/uL (ref 0.0–0.5)
Eosinophils Relative: 1 %
HCT: 45.2 % (ref 36.0–46.0)
Hemoglobin: 14.6 g/dL (ref 12.0–15.0)
Immature Granulocytes: 0 %
Lymphocytes Relative: 29 %
Lymphs Abs: 4.3 10*3/uL — ABNORMAL HIGH (ref 0.7–4.0)
MCH: 28.7 pg (ref 26.0–34.0)
MCHC: 32.3 g/dL (ref 30.0–36.0)
MCV: 88.8 fL (ref 80.0–100.0)
Monocytes Absolute: 1 10*3/uL (ref 0.1–1.0)
Monocytes Relative: 7 %
Neutro Abs: 9.3 10*3/uL — ABNORMAL HIGH (ref 1.7–7.7)
Neutrophils Relative %: 63 %
Platelets: 307 10*3/uL (ref 150–400)
RBC: 5.09 MIL/uL (ref 3.87–5.11)
RDW: 14 % (ref 11.5–15.5)
WBC: 14.8 10*3/uL — ABNORMAL HIGH (ref 4.0–10.5)
nRBC: 0 % (ref 0.0–0.2)

## 2020-03-14 LAB — TSH: TSH: 1.655 u[IU]/mL (ref 0.350–4.500)

## 2020-03-14 LAB — RESPIRATORY PANEL BY RT PCR (FLU A&B, COVID)
Influenza A by PCR: NEGATIVE
Influenza B by PCR: NEGATIVE
SARS Coronavirus 2 by RT PCR: NEGATIVE

## 2020-03-14 LAB — I-STAT BETA HCG BLOOD, ED (MC, WL, AP ONLY): I-stat hCG, quantitative: <5 m[IU]/mL (ref <5)

## 2020-03-14 LAB — ACETAMINOPHEN LEVEL: Acetaminophen (Tylenol), Serum: <10 ug/mL — ABNORMAL LOW (ref 10–30)

## 2020-03-14 LAB — SALICYLATE LEVEL: Salicylate Lvl: <7 mg/dL — ABNORMAL LOW (ref 7.0–30.0)

## 2020-03-14 MED ORDER — POLYETHYLENE GLYCOL 3350 17 GM/SCOOP PO POWD
17.0000 g | Freq: Every day | ORAL | Status: DC | PRN
  Filled 2020-03-14: qty 255

## 2020-03-14 MED ORDER — ENALAPRIL MALEATE 10 MG PO TABS
10.0000 mg | ORAL_TABLET | Freq: Every day | ORAL | Status: DC
  Administered 2020-03-15 (×2): 10 mg via ORAL
  Filled 2020-03-14 (×2): qty 1

## 2020-03-14 MED ORDER — ACETAMINOPHEN 325 MG PO TABS: 650.0000 mg | ORAL_TABLET | ORAL | Status: DC | PRN

## 2020-03-14 MED ORDER — LEVOTHYROXINE SODIUM 25 MCG PO TABS
125.0000 ug | ORAL_TABLET | Freq: Every day | ORAL | Status: DC
  Administered 2020-03-15: 125 ug via ORAL
  Filled 2020-03-14: qty 1

## 2020-03-14 MED ORDER — ATORVASTATIN CALCIUM 40 MG PO TABS
80.0000 mg | ORAL_TABLET | Freq: Every day | ORAL | Status: DC
  Administered 2020-03-15 (×2): 80 mg via ORAL
  Filled 2020-03-14 (×2): qty 1

## 2020-03-14 MED ORDER — ALUM & MAG HYDROXIDE-SIMETH 200-200-20 MG/5ML PO SUSP: 30.0000 mL | Freq: Four times a day (QID) | ORAL | Status: DC | PRN

## 2020-03-14 MED ORDER — SERTRALINE HCL 50 MG PO TABS
50.0000 mg | ORAL_TABLET | Freq: Every day | ORAL | Status: DC
  Administered 2020-03-15 (×2): 50 mg via ORAL
  Filled 2020-03-14 (×2): qty 1

## 2020-03-14 MED ORDER — ONDANSETRON HCL 4 MG PO TABS: 4.0000 mg | ORAL_TABLET | Freq: Three times a day (TID) | ORAL | Status: DC | PRN

## 2020-03-14 MED ORDER — ALBUTEROL SULFATE HFA 108 (90 BASE) MCG/ACT IN AERS: 2.0000 | INHALATION_SPRAY | Freq: Four times a day (QID) | RESPIRATORY_TRACT | Status: DC | PRN

## 2020-03-14 MED ORDER — LORATADINE 10 MG PO TABS
10.0000 mg | ORAL_TABLET | Freq: Every day | ORAL | Status: DC
  Administered 2020-03-15 (×2): 10 mg via ORAL
  Filled 2020-03-14 (×2): qty 1

## 2020-03-14 NOTE — ED Provider Notes (Signed)
[facility] DEPT Provider Note   CSN: [id number] Arrival date & time: 03/14/20  1518     History Chief Complaint  Patient presents with  . Medical Clearance  . Suicidal    Tanya Nolan is a 45 y.o. female.  HPI 44 year old female presents with depression.  Has been going on for a long time but much worse since her husband died in 11-18-2019.  Has been seeing her family practitioner on and off.  States she has not been taking her medicines for months since her husband died.  The history is somewhat limited as the patient is quite tearful and hard to get a history out of.  This has been a progressively worsening problem.  She states that because of his worsening status she called 911 today.  She does feel suicidal and states if she were to do it she would try and cut her wrist which she has tried in the past.  She denies any acute illness such as fever, chest pain, shortness of breath.  She states sometimes she is short of breath when she walks.  She also has been having a chronic cough for months.     Past Medical History:  Diagnosis Date  . Anxiety   . BACK STRAIN, LUMBAR 03/26/2009   Qualifier: Diagnosis of  By: Jerline Pain MD, Anderson Malta    . Chalazion 10/01/2007   Qualifier: Diagnosis of  By: Genene Churn MD, Janett Billow    . Chronic back pain    tx with ibuprofen  . Diabetes mellitus   . Hypercholesteremia   . Hyperlipidemia    no meds - tx with diet  . Hypertension   . Hypothyroidism   . Ingrown toenail 12/25/2014  . 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   C&B WNL  NEG PAPS after  . Nipple discharge 10/13/2013  . Non compliance w medication regimen 10/13/2013  . 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 Thyroid dysfunction     Patient Active Problem List   Diagnosis Date Noted  . Grief 11/10/2019  . Viral illness 09/23/2019  . Dermatitis 08/01/2019  . Abnormal urinalysis 07/15/2019  . Tobacco abuse  07/15/2019  . Chronic back pain 09/19/2016  . Hemorrhoids 02/02/2012  . Abdominal pain 03/26/2009  . Hypothyroidism 01/10/2007  . DM (diabetes mellitus), type 2, uncontrolled ([place]) 01/10/2007  . POLYCYSTIC OVARY 01/10/2007  . Hyperlipidemia 01/10/2007  . HYPERTENSION, BENIGN SYSTEMIC 01/10/2007    Past Surgical History:  Procedure Laterality Date  . [place] DECOMPRESSION  2008  . HYSTEROSCOPY X 2  2007 / 2012   ENDOMETRIAL POLYPS REMOVED     OB History    Gravida  0   Para      Term      Preterm      AB      Living        SAB      TAB      Ectopic      Multiple      Live Births              Family History  Problem Relation Age of Onset  . Hypertension Sister   . Lupus Mother   . Hypertension Mother   . Heart disease Father   . Gout Father   . Lupus Father     Social History   Tobacco Use  . Smoking status: Current Every Day Smoker    Packs/day: 0.50  Types: Cigarettes  . Smokeless tobacco: Never Used  Substance Use Topics  . Alcohol use: Yes    Alcohol/week: 0.0 standard drinks    Comment: socially - wine/liquor--Rare  . Drug use: No    Comment: past use "years ago" per patient    Home Medications Prior to Admission medications   Medication Sig Start Date End Date Taking? Authorizing Provider  acetaminophen (TYLENOL) 325 MG tablet Take 650 mg by mouth every 6 (six) hours as needed for mild pain or moderate pain.   Yes [provider]  Accu-Chek FastClix Lancets MISC Use to check CBG TID 07/22/19   [name], MD  albuterol (PROVENTIL HFA;VENTOLIN HFA) 108 (90 Base) MCG/ACT inhaler Inhale 2 puffs into the lungs every 6 (six) hours as needed for wheezing or shortness of breath. Patient not taking: Reported on 06/20/2019 03/10/16   Leeanne Rio, MD  atorvastatin (LIPITOR) 80 MG tablet Take 1 tablet (80 mg total) by mouth daily. Patient not taking: Reported on 08/01/2019 06/20/19   [name], MD  BD ULTRA-FINE PEN  NEEDLES 29G X 12.7MM MISC USE THREE TIMES A DAY BEFORE MEALS 06/10/13   Andrena Mews T, MD  Blood Glucose Monitoring Suppl (ACCU-CHEK GUIDE) w/Device KIT 1 applicator by Does not apply route 3 (three) times daily. Use to check CBG TID 07/22/19   [name], MD  cetirizine (ZYRTEC) 10 MG tablet Take 1 tablet (10 mg total) by mouth daily. Patient not taking: Reported on 06/20/2019 06/14/16   Tonette Bihari, MD  clonazePAM (KLONOPIN) 0.5 MG tablet Take 1 tablet (0.5 mg total) by mouth 2 (two) times daily as needed for anxiety. Patient not taking: Reported on 03/14/2020 11/10/19   Rory Percy, DO  enalapril (VASOTEC) 10 MG tablet Take 1 tablet (10 mg total) by mouth daily. Patient not taking: Reported on 08/01/2019 06/20/19   Andrena Mews T, MD  glucose blood test strip Use to check GBG TID 07/22/19   [name], MD  Guaifenesin 1200 MG TB12 Take 1 tablet (1,200 mg total) by mouth 2 (two) times daily. Patient not taking: Reported on 03/14/2020 09/27/19   Dalia Heading, PA-C  Insulin Glargine (BASAGLAR KWIKPEN) 100 UNIT/ML SOPN Inject 0.15 mLs (15 Units total) into the skin every morning. Patient not taking: Reported on 03/14/2020 11/20/19   Rory Percy, DO  insulin lispro (HUMALOG KWIKPEN) 100 UNIT/ML KwikPen Inject 0.05 mLs (5 Units total) into the skin 3 (three) times daily. Patient not taking: Reported on 03/14/2020 07/15/19   [name], MD  levothyroxine (SYNTHROID) 125 MCG tablet Take 1 tablet (125 mcg total) by mouth daily before breakfast. Reported on 02/23/2016 Patient not taking: Reported on 08/01/2019 06/20/19   [name], MD  meloxicam (MOBIC) 15 MG tablet Take 1 tablet (15 mg total) by mouth daily as needed. Patient not taking: Reported on 03/14/2020 11/14/17   [name], MD  ondansetron (ZOFRAN-ODT) 4 MG disintegrating tablet Take 1 tablet (4 mg total) by mouth every 8 (eight) hours as needed for nausea or vomiting. Patient not taking: Reported on  08/01/2019 06/20/19   Andrena Mews T, MD  polyethylene glycol powder (GLYCOLAX/MIRALAX) 17 GM/SCOOP powder Take 17 g by mouth daily as needed. Patient not taking: Reported on 08/01/2019 06/20/19   [name], MD  promethazine-dextromethorphan (PROMETHAZINE-DM) 6.25-15 MG/5ML syrup Take 5 mLs by mouth 4 (four) times daily as needed for cough. Patient not taking: Reported on 03/14/2020 09/27/19   Dalia Heading, PA-C  sertraline (ZOLOFT) 50 MG tablet Take 1 tablet (50 mg total) by mouth daily. Patient not taking: Reported on 03/14/2020 11/19/19   Rory Percy, DO  triamcinolone ointment (KENALOG) 0.5 % Apply 1 application topically 2 (two) times daily. Patient not taking: Reported on 03/14/2020 08/01/19   [name], MD  insulin lispro protamine-insulin lispro (HUMALOG 75/25) (75-25) 100 UNIT/ML SUSP Inject 40-50 Units into the skin 2 (two) times daily with a meal. Patient takes 40 units in the morning and 50 units at night.  Verified it is Humalog 75/25.  04/18/12  [provider]    Allergies    Patient has no known allergies.  Review of Systems   Review of Systems  Constitutional: Negative for fever.  Respiratory: Positive for cough.   Cardiovascular: Negative for chest pain.  Gastrointestinal: Negative for abdominal pain.  Psychiatric/Behavioral: Positive for dysphoric mood and suicidal ideas.  All other systems reviewed and are negative.   Physical Exam Updated Vital Signs BP (!) 182/105 (BP Location: Right Arm)   Pulse 69   Temp 98.8 F (37.1 C) (Oral)   Resp 17   SpO2 100%   Physical Exam Vitals and nursing note reviewed.  Constitutional:      General: She is in acute distress (depressed, crying).     Appearance: She is well-developed. She is obese.  HENT:     Head: Normocephalic and atraumatic.     Right Ear: External ear normal.     Left Ear: External ear normal.     Nose: Nose normal.  Eyes:     General:        Right eye: No discharge.         Left eye: No discharge.  Cardiovascular:     Rate and Rhythm: Regular rhythm. Tachycardia present.     Heart sounds: Normal heart sounds.  Pulmonary:     Effort: Pulmonary effort is normal.     Breath sounds: Normal breath sounds.  Abdominal:     Palpations: Abdomen is soft.     Tenderness: There is no abdominal tenderness.  Skin:    General: Skin is warm and dry.  Neurological:     Mental Status: She is alert.  Psychiatric:        Mood and Affect: Mood is depressed. Mood is not anxious. Affect is tearful.        Thought Content: Thought content includes suicidal ideation.     ED Results / Procedures / Treatments   Labs (all labs ordered are listed, but only abnormal results are displayed) Labs Reviewed  ACETAMINOPHEN LEVEL - Abnormal; Notable for the following components:      Result Value   Acetaminophen (Tylenol), Serum <10 (*)    All other components within normal limits  COMPREHENSIVE METABOLIC PANEL - Abnormal; Notable for the following components:   Glucose, Bld 163 (*)    All other components within normal limits  SALICYLATE LEVEL - Abnormal; Notable for the following components:   Salicylate Lvl <5.5 (*)    All other components within normal limits  CBC WITH DIFFERENTIAL/PLATELET - Abnormal; Notable for the following components:   WBC 14.8 (*)    Neutro Abs 9.3 (*)    Lymphs Abs 4.3 (*)    All other components within normal limits  RESPIRATORY PANEL BY RT PCR (FLU A&B, COVID)  ETHANOL  TSH  URINALYSIS, ROUTINE W REFLEX MICROSCOPIC  RAPID URINE DRUG SCREEN, HOSP PERFORMED  T4  I-STAT BETA HCG BLOOD, ED ([facility],  [facility], [facility] ONLY)    EKG None  Radiology DG Chest 2 View  Result Date: 03/14/2020 CLINICAL DATA:  Cough for few days. EXAM: CHEST - 2 VIEW COMPARISON:  Chest radiograph 08/04/2016 FINDINGS: The heart size and mediastinal contours are within normal limits. The lungs are clear. No pneumothorax or significant pleural effusion. The visualized skeletal structures  are unremarkable. IMPRESSION: No acute cardiopulmonary process. Electronically Signed   By: Audie Pinto M.D.   On: 03/14/2020 16:20    Procedures Procedures (including critical care time)  Medications Ordered in ED Medications  acetaminophen (TYLENOL) tablet 650 mg (has no administration in time range)  ondansetron (ZOFRAN) tablet 4 mg (has no administration in time range)  alum & mag hydroxide-simeth (MAALOX/MYLANTA) 200-200-20 MG/5ML suspension 30 mL (has no administration in time range)  atorvastatin (LIPITOR) tablet 80 mg (80 mg Oral Refused 03/14/20 2030)  albuterol (VENTOLIN HFA) 108 (90 Base) MCG/ACT inhaler 2 puff (has no administration in time range)  loratadine (CLARITIN) tablet 10 mg (10 mg Oral Refused 03/14/20 2030)  enalapril (VASOTEC) tablet 10 mg (10 mg Oral Refused 03/14/20 2030)  levothyroxine (SYNTHROID) tablet 125 mcg (has no administration in time range)  polyethylene glycol powder (GLYCOLAX/MIRALAX) container 17 g (has no administration in time range)  sertraline (ZOLOFT) tablet 50 mg (50 mg Oral Refused 03/14/20 2030)    ED Course  I have reviewed the triage vital signs and the nursing notes.  Pertinent labs & imaging results that were available during my care of the patient were reviewed by me and considered in my medical decision making (see chart for details).    MDM Rules/Calculators/A&P                      Patient screening labs are overall unremarkable. She has a leukocytosis but there's no clear infection signs/symptoms.  Her home medicines have been ordered.  This does not appear to be a medical issue but rather significant depression.  She will need psychiatric admission.  The patient has been placed in psychiatric observation due to the need to provide a safe environment for the patient while obtaining psychiatric consultation and evaluation, as well as ongoing medical and medication management to treat the patient's condition.  The patient has not been  placed under full IVC at this time.  Final Clinical Impression(s) / ED Diagnoses Final diagnoses:  Depression, unspecified depression type    Rx / DC Orders ED Discharge Orders    None       Sherwood Gambler, MD 03/14/20 2300
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
## 2020-03-14 NOTE — ED Triage Notes (Signed)
Patient called 911 for her own non- adherence with medication and reported to dispatch that she "wanted to find peace" Patient has a hx of grief and loss per her notes and was started on sertraline and is on Klonopin PRN for anxiety

## 2020-03-15 ENCOUNTER — Telehealth: Payer: Self-pay

## 2020-03-15 ENCOUNTER — Other Ambulatory Visit: Payer: Self-pay

## 2020-03-15 ENCOUNTER — Encounter (HOSPITAL_COMMUNITY): Payer: Self-pay | Admitting: Psychiatry

## 2020-03-15 ENCOUNTER — Inpatient Hospital Stay (HOSPITAL_COMMUNITY)
Admission: AD | Admit: 2020-03-15 | Discharge: 2020-03-25 | DRG: 885 | Disposition: A | Discharge status: Home / Self Care | Payer: Medicaid Other | Source: Intra-hospital | Attending: Psychiatry | Admitting: Psychiatry

## 2020-03-15 ENCOUNTER — Encounter (HOSPITAL_COMMUNITY): Payer: Self-pay | Admitting: Registered Nurse

## 2020-03-15 DIAGNOSIS — E1165 Type 2 diabetes mellitus with hyperglycemia: Secondary | ICD-10-CM

## 2020-03-15 DIAGNOSIS — E0590 Thyrotoxicosis, unspecified without thyrotoxic crisis or storm: Secondary | ICD-10-CM | POA: Diagnosis not present

## 2020-03-15 DIAGNOSIS — S39012A Strain of muscle, fascia and tendon of lower back, initial encounter: Secondary | ICD-10-CM | POA: Diagnosis present

## 2020-03-15 DIAGNOSIS — F332 Major depressive disorder, recurrent severe without psychotic features: Secondary | ICD-10-CM | POA: Diagnosis present

## 2020-03-15 DIAGNOSIS — I10 Essential (primary) hypertension: Secondary | ICD-10-CM | POA: Diagnosis not present

## 2020-03-15 DIAGNOSIS — G8929 Other chronic pain: Secondary | ICD-10-CM | POA: Diagnosis present

## 2020-03-15 DIAGNOSIS — E119 Type 2 diabetes mellitus without complications: Secondary | ICD-10-CM | POA: Diagnosis not present

## 2020-03-15 DIAGNOSIS — E039 Hypothyroidism, unspecified: Secondary | ICD-10-CM | POA: Diagnosis present

## 2020-03-15 DIAGNOSIS — Z79890 Hormone replacement therapy (postmenopausal): Secondary | ICD-10-CM | POA: Diagnosis not present

## 2020-03-15 DIAGNOSIS — Z8249 Family history of ischemic heart disease and other diseases of the circulatory system: Secondary | ICD-10-CM | POA: Diagnosis not present

## 2020-03-15 DIAGNOSIS — Z915 Personal history of self-harm: Secondary | ICD-10-CM

## 2020-03-15 DIAGNOSIS — Z791 Long term (current) use of non-steroidal anti-inflammatories (NSAID): Secondary | ICD-10-CM

## 2020-03-15 DIAGNOSIS — E7800 Pure hypercholesterolemia, unspecified: Secondary | ICD-10-CM | POA: Diagnosis not present

## 2020-03-15 DIAGNOSIS — E785 Hyperlipidemia, unspecified: Secondary | ICD-10-CM | POA: Diagnosis not present

## 2020-03-15 DIAGNOSIS — G4700 Insomnia, unspecified: Secondary | ICD-10-CM | POA: Diagnosis not present

## 2020-03-15 DIAGNOSIS — Z794 Long term (current) use of insulin: Secondary | ICD-10-CM | POA: Diagnosis not present

## 2020-03-15 DIAGNOSIS — R45851 Suicidal ideations: Secondary | ICD-10-CM

## 2020-03-15 DIAGNOSIS — F17210 Nicotine dependence, cigarettes, uncomplicated: Secondary | ICD-10-CM | POA: Diagnosis not present

## 2020-03-15 DIAGNOSIS — F600 Paranoid personality disorder: Secondary | ICD-10-CM | POA: Diagnosis present

## 2020-03-15 DIAGNOSIS — Z832 Family history of diseases of the blood and blood-forming organs and certain disorders involving the immune mechanism: Secondary | ICD-10-CM

## 2020-03-15 HISTORY — DX: Major depressive disorder, recurrent severe without psychotic features: F33.2

## 2020-03-15 LAB — RAPID URINE DRUG SCREEN, HOSP PERFORMED
Amphetamines: NOT DETECTED
Barbiturates: NOT DETECTED
Benzodiazepines: NOT DETECTED
Cocaine: NOT DETECTED
Opiates: NOT DETECTED
Tetrahydrocannabinol: NOT DETECTED

## 2020-03-15 LAB — GLUCOSE, CAPILLARY
Glucose-Capillary: 167 mg/dL — ABNORMAL HIGH (ref 70–99)
Glucose-Capillary: 104 mg/dL — ABNORMAL HIGH (ref 70–99)

## 2020-03-15 LAB — URINALYSIS, ROUTINE W REFLEX MICROSCOPIC
Bacteria, UA: NONE SEEN
Bilirubin Urine: NEGATIVE
Glucose, UA: NEGATIVE mg/dL
Ketones, ur: NEGATIVE mg/dL
Leukocytes,Ua: NEGATIVE
Nitrite: NEGATIVE
Protein, ur: NEGATIVE mg/dL
Specific Gravity, Urine: 1.023 (ref 1.005–1.030)
pH: 6 (ref 5.0–8.0)

## 2020-03-15 LAB — T4: T4, Total: 7.4 ug/dL (ref 4.5–12.0)

## 2020-03-15 MED ORDER — SERTRALINE HCL 50 MG PO TABS
50.0000 mg | ORAL_TABLET | Freq: Every day | ORAL | Status: DC
  Filled 2020-03-15 (×2): qty 1

## 2020-03-15 MED ORDER — SERTRALINE HCL 50 MG PO TABS
50.0000 mg | ORAL_TABLET | Freq: Every day | ORAL | Status: DC
  Administered 2020-03-16: 50 mg via ORAL
  Filled 2020-03-15 (×3): qty 1

## 2020-03-15 MED ORDER — POLYETHYLENE GLYCOL 3350 17 GM/SCOOP PO POWD: 17.0000 g | Freq: Every day | ORAL | Status: DC | PRN

## 2020-03-15 MED ORDER — POLYETHYLENE GLYCOL 3350 17 G PO PACK: 17.0000 g | PACK | Freq: Every day | ORAL | Status: DC | PRN

## 2020-03-15 MED ORDER — ALUM & MAG HYDROXIDE-SIMETH 200-200-20 MG/5ML PO SUSP: 30.0000 mL | Freq: Four times a day (QID) | ORAL | Status: DC | PRN

## 2020-03-15 MED ORDER — INSULIN ASPART 100 UNIT/ML ~~LOC~~ SOLN
0.0000 [IU] | Freq: Three times a day (TID) | SUBCUTANEOUS | Status: DC
  Administered 2020-03-15: 3 [IU] via SUBCUTANEOUS
  Administered 2020-03-16: 2 [IU] via SUBCUTANEOUS
  Administered 2020-03-16: 3 [IU] via SUBCUTANEOUS
  Administered 2020-03-17 – 2020-03-19 (×2): 2 [IU] via SUBCUTANEOUS
  Administered 2020-03-20: 3 [IU] via SUBCUTANEOUS
  Administered 2020-03-21 – 2020-03-22 (×3): 2 [IU] via SUBCUTANEOUS
  Administered 2020-03-23: 0 [IU] via SUBCUTANEOUS
  Administered 2020-03-23: 2 [IU] via SUBCUTANEOUS
  Administered 2020-03-24: 3 [IU] via SUBCUTANEOUS
  Administered 2020-03-25: 2 [IU] via SUBCUTANEOUS

## 2020-03-15 MED ORDER — ATORVASTATIN CALCIUM 40 MG PO TABS
80.0000 mg | ORAL_TABLET | Freq: Every day | ORAL | Status: DC
  Administered 2020-03-16 – 2020-03-25 (×10): 80 mg via ORAL
  Filled 2020-03-15 (×14): qty 2

## 2020-03-15 MED ORDER — ONDANSETRON HCL 4 MG PO TABS: 4.0000 mg | ORAL_TABLET | Freq: Three times a day (TID) | ORAL | Status: DC | PRN

## 2020-03-15 MED ORDER — ONDANSETRON 4 MG PO TBDP: 4.0000 mg | ORAL_TABLET | Freq: Three times a day (TID) | ORAL | Status: DC | PRN

## 2020-03-15 MED ORDER — BASAGLAR KWIKPEN 100 UNIT/ML ~~LOC~~ SOPN: 15.0000 [IU] | PEN_INJECTOR | SUBCUTANEOUS | Status: DC

## 2020-03-15 MED ORDER — LORATADINE 10 MG PO TABS
10.0000 mg | ORAL_TABLET | Freq: Every day | ORAL | Status: DC
  Administered 2020-03-16 – 2020-03-25 (×10): 10 mg via ORAL
  Filled 2020-03-15 (×12): qty 1

## 2020-03-15 MED ORDER — ENALAPRIL MALEATE 10 MG PO TABS
10.0000 mg | ORAL_TABLET | Freq: Every day | ORAL | Status: DC
  Administered 2020-03-16 – 2020-03-19 (×4): 10 mg via ORAL
  Filled 2020-03-15 (×5): qty 1
  Filled 2020-03-15: qty 4

## 2020-03-15 MED ORDER — ARIPIPRAZOLE 5 MG PO TABS
5.0000 mg | ORAL_TABLET | Freq: Every day | ORAL | Status: DC
  Administered 2020-03-15 – 2020-03-16 (×2): 5 mg via ORAL
  Filled 2020-03-15 (×5): qty 1

## 2020-03-15 MED ORDER — INSULIN ASPART 100 UNIT/ML ~~LOC~~ SOLN
5.0000 [IU] | Freq: Three times a day (TID) | SUBCUTANEOUS | Status: DC
  Administered 2020-03-15 – 2020-03-25 (×25): 5 [IU] via SUBCUTANEOUS

## 2020-03-15 MED ORDER — ACETAMINOPHEN 325 MG PO TABS: 650.0000 mg | ORAL_TABLET | ORAL | Status: DC | PRN

## 2020-03-15 MED ORDER — LEVOTHYROXINE SODIUM 125 MCG PO TABS
125.0000 ug | ORAL_TABLET | Freq: Every day | ORAL | Status: DC
  Administered 2020-03-16 – 2020-03-25 (×10): 125 ug via ORAL
  Filled 2020-03-15 (×13): qty 1

## 2020-03-15 MED ORDER — INSULIN LISPRO (1 UNIT DIAL) 100 UNIT/ML (KWIKPEN): 5.0000 [IU] | PEN_INJECTOR | Freq: Three times a day (TID) | SUBCUTANEOUS | Status: DC

## 2020-03-15 MED ORDER — INSULIN GLARGINE 100 UNIT/ML ~~LOC~~ SOLN
15.0000 [IU] | SUBCUTANEOUS | Status: DC
  Administered 2020-03-16 – 2020-03-25 (×10): 15 [IU] via SUBCUTANEOUS

## 2020-03-15 NOTE — BH Assessment (Signed)
[facility] Assessment Progress Note  Per Shuvon Rankin, FNP, this pt requires psychiatric hospitalization at this time.  Jasmine has assigned pt to [facility] Rm [id number]; [facility] will be ready to receive pt at 15:00.  Pt has signed Voluntary Admission and Consent for Treatment and signed forms have been faxed to [facility].  Pt's nurse, Joellen Jersey, has been notified, and agrees to send original paperwork along with pt via [facility], and to call report to [phone].  Jalene Mullet, [place] Coordinator [phone]

## 2020-03-15 NOTE — Telephone Encounter (Signed)
Attempted to reach pt to  Confirm appt for tomorrow. No answer. LVM  For pt to call the office if needing to cancel appt. Salvatore Marvel, CMA

## 2020-03-15 NOTE — ED Notes (Signed)
Called report to [facility] and Civil engineer, contracting for transport.

## 2020-03-15 NOTE — BHH Suicide Risk Assessment (Signed)
[facility] Admission Suicide Risk Assessment   Nursing information obtained from:   patient and chart Demographic factors:   38 y old female, widowed, lives with mother Current Mental Status:   see below Loss Factors:   loss of loved ones, husband passed away 11/18/2023, sister 12-19-2022 Historical Factors:   history of depression Risk Reduction Factors:   resilience   Total Time spent with patient: 45 minutes Principal Problem: depression, anxiety Diagnosis:  MDD Subjective Data:   Continued Clinical Symptoms:  Alcohol Use Disorder Identification Test Final Score (AUDIT): 1 The "Alcohol Use Disorders Identification Test", Guidelines for Use in Primary Care, Second Edition.  World Pharmacologist [facility]). Score between 0-7:  no or low risk or alcohol related problems. Score between 8-15:  moderate risk of alcohol related problems. Score between 16-19:  high risk of alcohol related problems. Score 20 or above:  warrants further diagnostic evaluation for alcohol dependence and treatment.   CLINICAL FACTORS:  13, widowed ( husband died in November 22, 2019) , no children, lives with her mother. Employed part time. Presented to ED via EMS whom she had called . States she has been feeling depressed , sad . States her depression is chronic but has worsened over recent months due to death of loved ones. Her husband died in November 22, 2019 and her sister passed away in 12/23/19 from [place]. Endorses neuro-vegetative symptoms of depression- endorses sadness, anhedonia, decreased sleep, poor appetite. Reports she has lost about 60 lbs over the last several months . She endorses suicidal ideations, with recent thoughts of cutting self. Also endorses passive SI, and states " sometimes I would rather be gone, not here". In addition to depression, also endorses increased anxiety/ panic attacks She also reports she has been feeling " paranoid" and states " I feel like there is something going on, like people are out to  get me, like someone is after my family". She reports occasional auditory hallucinations but states " it is more like my own thoughts " . Currently does not present internally preoccupied. States that due to the above has been isolating at home more, going out infrequently Denies prior psychiatric admissions. Reports that she had tried to cut her wrists several days after her husband died, but did not seek treatment at the time. Denies history of mania . Reports vague psychotic symptoms as above . Endorses recent panic attacks. Denies alcohol or drug abuse . Reports history of DM, HTN, Hyperthyroidism. Smokes 1 PPD  NKDA. Reports she had not been taking medications for several weeks.  Chest X Ray was done in ED due to report of cough: no acute cardiopulmonary findings .  Was taking Klonopin PRN and Zoloft - has not been taking over recent weeks.  Dx- MDD, with psychotic symptoms  Plan- Inpatient admission. Patient reports Zoloft was well tolerated and helpful in the past.  Will start Zoloft 50 mgrs QDAY for depression, start Abilify 5 mgrs QDAY for augmentation and psychotic symptoms. I have reviewed with hospitalist consultant regarding resuming her medications for DM ( CBG at the time of this evaluation was 167) , Hypothyroidism, HTN Resume Synthroid 125 micrograms daily, VAstoec 10 mgrs QDAY , Insulin 15 units , and Novolog sliding scale . Have also requested Diabetes Coordinator consultation for help with DM management.      Musculoskeletal: Strength & Muscle Tone: within normal limits Gait & Station: normal Patient leans: N/A  Psychiatric Specialty Exam: Physical Exam  Review of Systems no headache, no chest pain,  no shortness of breath, reports chronic cough   Blood pressure 139/78, pulse 74, temperature 97.9 F (36.6 C), temperature source Oral, resp. rate 16, height 5\' 6"  (1.676 m), weight 104.3 kg, SpO2 99 %.Body mass index is 37.12 kg/m.  General Appearance: Fairly Groomed  Eye  Contact:  limited   Speech:  Normal Rate  Volume:  Decreased  Mood:  Depressed  Affect:  constricted   Thought Process:  Linear and Descriptions of Associations: Intact  Orientation:  Other:  fully alert and attentive   Thought Content:  endorses feeling " paranoid". No overt delusions expressed, does not currently present internally preoccupied   Suicidal Thoughts:  No denies current suicidal or self injurious ideations , denies homicidal ideations  Homicidal Thoughts:  No  Memory:  recent and remote grossly intact   Judgement:  Fair  Insight:  Fair  Psychomotor Activity:  Decreased  Concentration:  Concentration: Fair and Attention Span: Fair  Recall:  Good  Fund of Knowledge:  Good  Language:  Good  Akathisia:  Negative  Handed:  Right  AIMS (if indicated):     Assets:  Communication Skills Desire for Improvement Resilience  ADL's:  Intact  Cognition:  WNL  Sleep:         COGNITIVE FEATURES THAT CONTRIBUTE TO RISK:  Closed-mindedness, Loss of executive function and Polarized thinking    SUICIDE RISK:   Moderate:  Frequent suicidal ideation with limited intensity, and duration, some specificity in terms of plans, no associated intent, good self-control, limited dysphoria/symptomatology, some risk factors present, and identifiable protective factors, including available and accessible social support.  PLAN OF CARE: Patient will be admitted to inpatient psychiatric unit for stabilization and safety. Will provide and encourage milieu participation. Provide medication management and maked adjustments as needed.  Will follow daily.    I certify that inpatient services furnished can reasonably be expected to improve the patient's condition.   Jenne Campus, MD 03/15/2020, 3:55 PM

## 2020-03-15 NOTE — Plan of Care (Signed)
Patient has been in the milieu, mostly in the dayroom. Alert and oriented and denying thoughts of self harm. Attended group and had a snack. CBG 104. Patient did not have any concern.

## 2020-03-15 NOTE — Tx Team (Signed)
Initial Treatment Plan 03/15/2020 4:24 PM [facility] [id number]    PATIENT STRESSORS: Health problems Loss of significant other. Medication change or noncompliance   PATIENT STRENGTHS: Ability for insight Capable of independent living Communication skills Supportive family/friends   PATIENT IDENTIFIED PROBLEMS: "Coping skills"  "Peace of mind"  Depression  Suicidal ideation               DISCHARGE CRITERIA:  Ability to meet basic life and health needs Adequate post-discharge living arrangements Motivation to continue treatment in a less acute level of care  PRELIMINARY DISCHARGE PLAN: Attend aftercare/continuing care group Outpatient therapy Return to previous living arrangement  PATIENT/FAMILY INVOLVEMENT: This treatment plan has been presented to and reviewed with the patient, RAHF ROSDAHL, and/or family member.  The patient and family have been given the opportunity to ask questions and make suggestions.  Vela Prose, RN 03/15/2020, 4:24 PM

## 2020-03-15 NOTE — Progress Notes (Signed)
Admission Note: Patient is a 44 year old female admitted to the unit for symptoms of depression, suicidal ideation and medication noncompliance.  Patient is alert and oriented x 4.  Presents with a flat affect and depressed mood.  Patient was tearful throughout admission process.  States she is here to learn coping skills and peace of mind.  Admission plan of care reviewed and consent signed.  Skin assessment and personal belongings completed.  Skin is dry and intact.  No contraband found.  Patient oriented to the unit, staff and room.  Routine safety checks initiated.  Verbalizes understanding of unit rules and protocols.  Patient is safe on the unit.

## 2020-03-15 NOTE — H&P (Addendum)
Psychiatric Admission Assessment Adult  Patient Identification: Tanya Nolan MRN:  [id number] Date of Evaluation:  03/15/2020 Chief Complaint:  MDD REC SEV Principal Diagnosis: <principal problem not specified> Diagnosis:  Active Problems:   * No active hospital problems. *  History of Present Illness: From MD's admission SRA: 65, widowed ( husband died in December 02, 2019) , no children, lives with her mother. Employed part time. Presented to ED via EMS whom she had called . States she has been feeling depressed , sad . States her depression is chronic but has worsened over recent months due to death of loved ones. Her husband died in 12-02-19 and her sister passed away in 01-02-20 from [place]. Endorses neuro-vegetative symptoms of depression- endorses sadness, anhedonia, decreased sleep, poor appetite. Reports she has lost about 60 lbs over the last several months . She endorses suicidal ideations, which she currently characterizes as passive, states " sometimes I would rather be gone, not here". In addition to depression, also endorses increased anxiety/ panic attacks She also reports she has been feeling " paranoid" and states " I feel like there is something going on, like people are out to get me". She reports occasional auditory hallucinations but states " it is more like my own thoughts " . Currently does not present internally preoccupied. Denies prior psychiatric admissions. Reports that she had tried to cut her wrists several days after her husband died, but did not seek treatment at the time. Denies history of mania . Reports vague psychotic symptoms as above . Endorses recent panic attacks. Denies alcohol or drug abuse . Reports history of DM, HTN, Hyperthyroidism. Smokes 1 PPD. NKDA. Reports she had not been taking medications for several weeks.  Chest X Ray was done in ED due to report of cough: no acute cardiopulmonary findings .  Was taking Klonopin PRN and Zoloft - has not been  taking recently.   Associated Signs/Symptoms: Depression Symptoms:  depressed mood, anhedonia, insomnia, fatigue, suicidal thoughts without plan, anxiety, panic attacks, weight loss, decreased appetite, (Hypo) Manic Symptoms:  denies Anxiety Symptoms:  Excessive Worry, Psychotic Symptoms:  Hallucinations: Auditory Paranoia, PTSD Symptoms: Negative Total Time spent with patient: 30 minutes  Past Psychiatric History: History of anxiety and depression that have worsened since the death of her husband in 12/02/2019 and sister in 01-02-20. She reports history of one suicide attempt via cutting her wrist 1-2 weeks after her husband passed away. Denies prior hospitalizations.  Is the patient at risk to self? Yes.    Has the patient been a risk to self in the past 6 months? Yes.    Has the patient been a risk to self within the distant past? No.  Is the patient a risk to others? No.  Has the patient been a risk to others in the past 6 months? No.  Has the patient been a risk to others within the distant past? No.   Prior Inpatient Therapy:   Prior Outpatient Therapy:    Alcohol Screening: 1. How often do you have a drink containing alcohol?: Monthly or less 2. How many drinks containing alcohol do you have on a typical day when you are drinking?: 1 or 2 3. How often do you have six or more drinks on one occasion?: Never AUDIT-C Score: 1 4. How often during the last year have you found that you were not able to stop drinking once you had started?: Never 5. How often during the last year have you  failed to do what was normally expected from you becasue of drinking?: Never 6. How often during the last year have you needed a first drink in the morning to get yourself going after a heavy drinking session?: Never 7. How often during the last year have you had a feeling of guilt of remorse after drinking?: Never 8. How often during the last year have you been unable to remember what  happened the night before because you had been drinking?: Never 9. Have you or someone else been injured as a result of your drinking?: No 10. Has a relative or friend or a doctor or another health worker been concerned about your drinking or suggested you cut down?: No Alcohol Use Disorder Identification Test Final Score (AUDIT): 1 Substance Abuse History in the last 12 months:  No. Consequences of Substance Abuse: NA Previous Psychotropic Medications: Yes  Psychological Evaluations: No  Past Medical History:  Past Medical History:  Diagnosis Date  . Anxiety   . BACK STRAIN, LUMBAR 03/26/2009   Qualifier: Diagnosis of  By: Jerline Pain MD, Anderson Malta    . Chalazion 10/01/2007   Qualifier: Diagnosis of  By: Genene Churn MD, Janett Billow    . Chronic back pain    tx with ibuprofen  . Diabetes mellitus   . Hypercholesteremia   . Hyperlipidemia    no meds - tx with diet  . Hypertension   . Hypothyroidism   . Ingrown toenail 12/25/2014  . 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   C&B WNL  NEG PAPS after  . Nipple discharge 10/13/2013  . Non compliance w medication regimen 10/13/2013  . 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 Thyroid dysfunction     Past Surgical History:  Procedure Laterality Date  . [place] DECOMPRESSION  2008  . HYSTEROSCOPY X 2  2007 / 2012   ENDOMETRIAL POLYPS REMOVED   Family History:  Family History  Problem Relation Age of Onset  . Hypertension Sister   . Lupus Mother   . Hypertension Mother   . Heart disease Father   . Gout Father   . Lupus Father    Family Psychiatric  History: Denies Tobacco Screening:   Social History:  Social History   Substance and Sexual Activity  Alcohol Use Yes  . Alcohol/week: 0.0 standard drinks   Comment: socially - wine/liquor--Rare     Social History   Substance and Sexual Activity  Drug Use No   Comment: past use "years ago" per patient    Additional Social History:                            Allergies:  No Known Allergies Lab Results:  Results for orders placed or performed during the hospital encounter of 03/14/20 (from the past 48 hour(s))  Acetaminophen level     Status: Abnormal   Collection Time: 03/14/20  3:44 PM  Result Value Ref Range   Acetaminophen (Tylenol), Serum <10 (L) 10 - 30 ug/mL    Comment: (NOTE) Therapeutic concentrations vary significantly. A range of 10-30 ug/mL  may be an effective concentration for many patients. However, some  are best treated at concentrations outside of this range. Acetaminophen concentrations >150 ug/mL at 4 hours after ingestion  and >50 ug/mL at 12 hours after ingestion are often associated with  toxic reactions. Performed at [facility], [place] [address] [place], [place] [address]   Comprehensive metabolic panel  Status: Abnormal   Collection Time: 03/14/20  3:44 PM  Result Value Ref Range   Sodium 141 135 - 145 mmol/L   Potassium 3.6 3.5 - 5.1 mmol/L   Chloride 109 98 - 111 mmol/L   CO2 23 22 - 32 mmol/L   Glucose, Bld 163 (H) 70 - 99 mg/dL    Comment: Glucose reference range applies only to samples taken after fasting for at least 8 hours.   BUN 10 6 - 20 mg/dL   Creatinine, Ser 0.86 0.44 - 1.00 mg/dL   Calcium 9.3 8.9 - 10.3 mg/dL   Total Protein 7.7 6.5 - 8.1 g/dL   Albumin 4.0 3.5 - 5.0 g/dL   AST 21 15 - 41 U/L   ALT 14 0 - 44 U/L   Alkaline Phosphatase 64 38 - 126 U/L   Total Bilirubin 0.3 0.3 - 1.2 mg/dL   GFR calc non Af Amer >60 >60 mL/min   GFR calc Af Amer >60 >60 mL/min   Anion gap 9 5 - 15    Comment: Performed at [facility], [place] [address] [place], [place] [address]  Ethanol     Status: None   Collection Time: 03/14/20  3:44 PM  Result Value Ref Range   Alcohol, Ethyl (B) <10 <10 mg/dL    Comment: (NOTE) Lowest detectable limit for serum alcohol is 10 mg/dL. For medical purposes only. Performed at [facility], [place] [address], [place], [place] [address]   Salicylate level     Status: Abnormal   Collection Time: 03/14/20  3:44 PM  Result Value Ref Range   Salicylate Lvl <8.5 (L) 7.0 - 30.0 mg/dL    Comment: Performed at [facility], [place] [address], [place], [place] [address]  CBC with Differential     Status: Abnormal   Collection Time: 03/14/20  3:44 PM  Result Value Ref Range   WBC 14.8 (H) 4.0 - 10.5 K/uL   RBC 5.09 3.87 - 5.11 MIL/uL   Hemoglobin 14.6 12.0 - 15.0 g/dL   HCT 45.2 36.0 - 46.0 %   MCV 88.8 80.0 - 100.0 fL   MCH 28.7 26.0 - 34.0 pg   MCHC 32.3 30.0 - 36.0 g/dL   RDW 14.0 11.5 - 15.5 %   Platelets 307 150 - 400 K/uL   nRBC 0.0 0.0 - 0.2 %   Neutrophils Relative % 63 %   Neutro Abs 9.3 (H) 1.7 - 7.7 K/uL   Lymphocytes Relative 29 %   Lymphs Abs 4.3 (H) 0.7 - 4.0 K/uL   Monocytes Relative 7 %   Monocytes Absolute 1.0 0.1 - 1.0 K/uL   Eosinophils Relative 1 %   Eosinophils Absolute 0.1 0.0 - 0.5 K/uL   Basophils Relative 0 %   Basophils Absolute 0.1 0.0 - 0.1 K/uL   Immature Granulocytes 0 %   Abs Immature Granulocytes 0.04 0.00 - 0.07 K/uL    Comment: Performed at [facility], [place] [address], [place], [place] [address]  TSH     Status: None   Collection Time: 03/14/20  3:44 PM  Result Value Ref Range   TSH 1.655 0.350 - 4.500 uIU/mL    Comment: Performed by a 3rd Generation assay with a functional sensitivity of <=0.01 uIU/mL. Performed at [facility], [place] [address] [place], [place] [address]   T4     Status: None   Collection Time: 03/14/20  3:44 PM  Result  Value Ref Range   T4, Total 7.4 4.5 - 12.0 ug/dL    Comment: (NOTE) Performed At: [facility] [place], [place] [id number] Rush Farmer MD TD:[id number]   Respiratory Panel by RT PCR (Flu A&B, Covid) - Nasopharyngeal Swab     Status: None   Collection Time: 03/14/20  3:44 PM   Specimen: Nasopharyngeal Swab   Result Value Ref Range   SARS Coronavirus 2 by RT PCR NEGATIVE NEGATIVE    Comment: (NOTE) SARS-CoV-2 target nucleic acids are NOT DETECTED. The SARS-CoV-2 RNA is generally detectable in upper respiratoy specimens during the acute phase of infection. The lowest concentration of SARS-CoV-2 viral copies this assay can detect is 131 copies/mL. A negative result does not preclude SARS-Cov-2 infection and should not be used as the sole basis for treatment or other patient management decisions. A negative result may occur with  improper specimen collection/handling, submission of specimen other than nasopharyngeal swab, presence of viral mutation(s) within the areas targeted by this assay, and inadequate number of viral copies (<131 copies/mL). A negative result must be combined with clinical observations, patient history, and epidemiological information. The expected result is Negative. Fact Sheet for Patients:  PinkCheek.be Fact Sheet for Healthcare Providers:  GravelBags.it This test is not yet ap proved or cleared by the [place] FDA and  has been authorized for detection and/or diagnosis of SARS-CoV-2 by FDA under an Emergency Use Authorization (EUA). This EUA will remain  in effect (meaning this test can be used) for the duration of the COVID-19 declaration under Section 564(b)(1) of the Act, 21 U.S.C. section 360bbb-3(b)(1), unless the authorization is terminated or revoked sooner.    Influenza A by PCR NEGATIVE NEGATIVE   Influenza B by PCR NEGATIVE NEGATIVE    Comment: (NOTE) The Xpert Xpress SARS-CoV-2/FLU/RSV assay is intended as an aid in  the diagnosis of influenza from Nasopharyngeal swab specimens and  should not be used as a sole basis for treatment. Nasal washings and  aspirates are unacceptable for Xpert Xpress SARS-CoV-2/FLU/RSV  testing. Fact Sheet for  Patients: PinkCheek.be Fact Sheet for Healthcare Providers: GravelBags.it This test is not yet approved or cleared by the [place] FDA and  has been authorized for detection and/or diagnosis of SARS-CoV-2 by  FDA under an Emergency Use Authorization (EUA). This EUA will remain  in effect (meaning this test can be used) for the duration of the  Covid-19 declaration under Section 564(b)(1) of the Act, 21  U.S.C. section 360bbb-3(b)(1), unless the authorization is  terminated or revoked. Performed at [facility], [place] [address], [place], [place] [address]   I-Stat beta hCG blood, ED     Status: None   Collection Time: 03/14/20  4:07 PM  Result Value Ref Range   I-stat hCG, quantitative <5.0 <5 mIU/mL   Comment 3            Comment:   GEST. AGE      CONC.  (mIU/mL)   <=1 WEEK        5 - 50     2 WEEKS       50 - 500     3 WEEKS       100 - 10,000     4 WEEKS     1,000 - 30,000        FEMALE AND NON-PREGNANT FEMALE:     LESS THAN 5 mIU/mL   Urinalysis, Routine w reflex microscopic     Status:  Abnormal   Collection Time: 03/15/20  5:03 AM  Result Value Ref Range   Color, Urine YELLOW YELLOW   APPearance CLEAR CLEAR   Specific Gravity, Urine 1.023 1.005 - 1.030   pH 6.0 5.0 - 8.0   Glucose, UA NEGATIVE NEGATIVE mg/dL   Hgb urine dipstick MODERATE (A) NEGATIVE   Bilirubin Urine NEGATIVE NEGATIVE   Ketones, ur NEGATIVE NEGATIVE mg/dL   Protein, ur NEGATIVE NEGATIVE mg/dL   Nitrite NEGATIVE NEGATIVE   Leukocytes,Ua NEGATIVE NEGATIVE   RBC / HPF 0-5 0 - 5 RBC/hpf   WBC, UA 0-5 0 - 5 WBC/hpf   Bacteria, UA NONE SEEN NONE SEEN   Squamous Epithelial / LPF 0-5 0 - 5   Mucus PRESENT    Ca Oxalate Crys, UA PRESENT     Comment: Performed at [facility], [place] [address] [place], [place] [address]  Urine rapid drug screen (hosp performed)     Status: None   Collection Time: 03/15/20  5:03  AM  Result Value Ref Range   Opiates NONE DETECTED NONE DETECTED   Cocaine NONE DETECTED NONE DETECTED   Benzodiazepines NONE DETECTED NONE DETECTED   Amphetamines NONE DETECTED NONE DETECTED   Tetrahydrocannabinol NONE DETECTED NONE DETECTED   Barbiturates NONE DETECTED NONE DETECTED    Comment: (NOTE) DRUG SCREEN FOR MEDICAL PURPOSES ONLY.  IF CONFIRMATION IS NEEDED FOR ANY PURPOSE, NOTIFY LAB WITHIN 5 DAYS. LOWEST DETECTABLE LIMITS FOR URINE DRUG SCREEN Drug Class                     Cutoff (ng/mL) Amphetamine and metabolites    1000 Barbiturate and metabolites    200 Benzodiazepine                 681 Tricyclics and metabolites     300 Opiates and metabolites        300 Cocaine and metabolites        300 THC                            50 Performed at [facility], [place] [address], [place], [place] [address]     Blood Alcohol level:  Lab Results  Component Value Date   ETH <10 20/35/5974    Metabolic Disorder Labs:  Lab Results  Component Value Date   HGBA1C 11.1 (A) 06/20/2019   No results found for: PROLACTIN Lab Results  Component Value Date   CHOL 241 (H) 06/20/2019   TRIG 191 (H) 06/20/2019   HDL 28 (L) 06/20/2019   CHOLHDL 8.6 (H) 06/20/2019   VLDL 50 (H) 09/19/2016   LDLCALC 175 (H) 06/20/2019   LDLCALC 149 (H) 09/19/2016    Current Medications: Current Facility-Administered Medications  Medication Dose Route Frequency Provider Last Rate Last Admin  . ARIPiprazole (ABILIFY) tablet 5 mg  5 mg Oral Daily Glennette Galster, Myer Peer, MD      . Derrill Memo ON 03/16/2020] levothyroxine (SYNTHROID) tablet 125 mcg  125 mcg Oral Q0600 Foday Cone A, MD      . sertraline (ZOLOFT) tablet 50 mg  50 mg Oral Daily Shaneice Barsanti, Myer Peer, MD       PTA Medications: Medications Prior to Admission  Medication Sig Dispense Refill Last Dose  . Accu-Chek FastClix Lancets MISC Use to check CBG TID 100 each 12   . acetaminophen (TYLENOL) 325 MG tablet Take 650 mg by  mouth every 6 (  six) hours as needed for mild pain or moderate pain.     Marland Kitchen albuterol (PROVENTIL HFA;VENTOLIN HFA) 108 (90 Base) MCG/ACT inhaler Inhale 2 puffs into the lungs every 6 (six) hours as needed for wheezing or shortness of breath. (Patient not taking: Reported on 06/20/2019) 1 Inhaler 0   . atorvastatin (LIPITOR) 80 MG tablet Take 1 tablet (80 mg total) by mouth daily. (Patient not taking: Reported on 08/01/2019) 90 tablet 1   . BD ULTRA-FINE PEN NEEDLES 29G X 12.7MM MISC USE THREE TIMES A DAY BEFORE MEALS 1 each 2   . Blood Glucose Monitoring Suppl (ACCU-CHEK GUIDE) w/Device KIT 1 applicator by Does not apply route 3 (three) times daily. Use to check CBG TID 1 kit 0   . cetirizine (ZYRTEC) 10 MG tablet Take 1 tablet (10 mg total) by mouth daily. (Patient not taking: Reported on 06/20/2019) 30 tablet 11   . clonazePAM (KLONOPIN) 0.5 MG tablet Take 1 tablet (0.5 mg total) by mouth 2 (two) times daily as needed for anxiety. (Patient not taking: Reported on 03/14/2020) 20 tablet 0   . enalapril (VASOTEC) 10 MG tablet Take 1 tablet (10 mg total) by mouth daily. (Patient not taking: Reported on 08/01/2019) 90 tablet 1   . glucose blood test strip Use to check GBG TID 100 each 12   . Guaifenesin 1200 MG TB12 Take 1 tablet (1,200 mg total) by mouth 2 (two) times daily. (Patient not taking: Reported on 03/14/2020) 20 tablet 0   . Insulin Glargine (BASAGLAR KWIKPEN) 100 UNIT/ML SOPN Inject 0.15 mLs (15 Units total) into the skin every morning. (Patient not taking: Reported on 03/14/2020) 3 mL 11   . insulin lispro (HUMALOG KWIKPEN) 100 UNIT/ML KwikPen Inject 0.05 mLs (5 Units total) into the skin 3 (three) times daily. (Patient not taking: Reported on 03/14/2020) 30 mL 1   . levothyroxine (SYNTHROID) 125 MCG tablet Take 1 tablet (125 mcg total) by mouth daily before breakfast. Reported on 02/23/2016 (Patient not taking: Reported on 08/01/2019) 90 tablet 1   . meloxicam (MOBIC) 15 MG tablet Take 1 tablet (15 mg total)  by mouth daily as needed. (Patient not taking: Reported on 03/14/2020) 30 tablet 1   . ondansetron (ZOFRAN-ODT) 4 MG disintegrating tablet Take 1 tablet (4 mg total) by mouth every 8 (eight) hours as needed for nausea or vomiting. (Patient not taking: Reported on 08/01/2019) 20 tablet 0   . polyethylene glycol powder (GLYCOLAX/MIRALAX) 17 GM/SCOOP powder Take 17 g by mouth daily as needed. (Patient not taking: Reported on 08/01/2019) 3350 g 1   . promethazine-dextromethorphan (PROMETHAZINE-DM) 6.25-15 MG/5ML syrup Take 5 mLs by mouth 4 (four) times daily as needed for cough. (Patient not taking: Reported on 03/14/2020) 240 mL 0   . sertraline (ZOLOFT) 50 MG tablet Take 1 tablet (50 mg total) by mouth daily. (Patient not taking: Reported on 03/14/2020) 90 tablet 0   . triamcinolone ointment (KENALOG) 0.5 % Apply 1 application topically 2 (two) times daily. (Patient not taking: Reported on 03/14/2020) 30 g 0     Musculoskeletal: Strength & Muscle Tone: within normal limits Gait & Station: normal Patient leans: N/A  Psychiatric Specialty Exam: Physical Exam  Review of Systems  Blood pressure 139/78, pulse 74, temperature 97.9 F (36.6 C), temperature source Oral, resp. rate 16, height '5\' 6"'  (1.676 m), weight 104.3 kg, SpO2 99 %.Body mass index is 37.12 kg/m.  General Appearance: Disheveled  Eye Contact:  None  Speech:  Slow  Volume:  Decreased  Mood:  Depressed  Affect:  Congruent  Thought Process:  Coherent  Orientation:  Full (Time, Place, and Person)  Thought Content:  Logical  Suicidal Thoughts:  Yes.  without intent/plan  Homicidal Thoughts:  No  Memory:  Immediate;   Fair Recent;   Fair Remote;   Fair  Judgement:  Fair  Insight:  Fair  Psychomotor Activity:  Decreased  Concentration:  Concentration: Fair and Attention Span: Fair  Recall:  [employer] of Knowledge:  Fair  Language:  Good  Akathisia:  No  Handed:  Right  AIMS (if indicated):     Assets:  Communication  Skills Housing Resilience Social Support  ADL's:  Intact  Cognition:  WNL  Sleep:       Treatment Plan Summary: Daily contact with patient to assess and evaluate symptoms and progress in treatment and Medication management  Inpatient hospitalization.  See MD's admission SRA for medication management.  Patient will participate in the therapeutic group milieu.  Discharge disposition in progress.   Observation Level/Precautions:  15 minute checks  Laboratory:  a1c lipid panel  Psychotherapy:  Group therapy  Medications:  See MAR  Consultations:  PRN  Discharge Concerns:  Safety and stabilization  Estimated LOS: 3-5 days  Other:     Physician Treatment Plan for Primary Diagnosis: <principal problem not specified> Long Term Goal(s): Improvement in symptoms so as ready for discharge  Short Term Goals: Ability to identify changes in lifestyle to reduce recurrence of condition will improve, Ability to verbalize feelings will improve and Ability to disclose and discuss suicidal ideas  Physician Treatment Plan for Secondary Diagnosis: Active Problems:   * No active hospital problems. *  Long Term Goal(s): Improvement in symptoms so as ready for discharge  Short Term Goals: Ability to demonstrate self-control will improve and Ability to identify and develop effective coping behaviors will improve  I certify that inpatient services furnished can reasonably be expected to improve the patient's condition.    Connye Burkitt, NP 5/3/20214:25 PM   I have discussed case with NP and have met with patient  Agree with NP note and assessment  40, widowed ( husband died in 01-Dec-2019) , no children, lives with her mother. Employed part time. Presented to ED via EMS whom she had called . States she has been feeling depressed , sad . States her depression is chronic but has worsened over recent months due to death of loved ones. Her husband died in 12-01-2019 and her sister passed away  in 01/01/20 from [place]. Endorses neuro-vegetative symptoms of depression- endorses sadness, anhedonia, decreased sleep, poor appetite. Reports she has lost about 60 lbs over the last several months . She endorses suicidal ideations, with recent thoughts of cutting self. Also endorses passive SI, and states " sometimes I would rather be gone, not here". In addition to depression, also endorses increased anxiety/ panic attacks She also reports she has been feeling " paranoid" and states " I feel like there is something going on, like people are out to get me, like someone is after my family". She reports occasional auditory hallucinations but states " it is more like my own thoughts " . Currently does not present internally preoccupied. States that due to the above has been isolating at home more, going out infrequently Denies prior psychiatric admissions. Reports that she had tried to cut her wrists several days after her husband died, but did not seek treatment at the time. Denies  history of mania . Reports vague psychotic symptoms as above . Endorses recent panic attacks. Denies alcohol or drug abuse . Reports history of DM, HTN, Hyperthyroidism. Smokes 1 PPD  NKDA. Reports she had not been taking medications for several weeks.  Chest X Ray was done in ED due to report of cough: no acute cardiopulmonary findings .  Was taking Klonopin PRN and Zoloft - has not been taking over recent weeks.  Dx- MDD, with psychotic symptoms  Plan- Inpatient admission. Patient reports Zoloft was well tolerated and helpful in the past.  Will start Zoloft 50 mgrs QDAY for depression, start Abilify 5 mgrs QDAY for augmentation and psychotic symptoms. I have reviewed with hospitalist consultant regarding resuming her medications for DM ( CBG at the time of this evaluation was 167) , Hypothyroidism, HTN Resume Synthroid 125 micrograms daily, VAstoec 10 mgrs QDAY , Insulin 15 units , and Novolog sliding scale . Have  also requested Diabetes Coordinator consultation for help with DM management.

## 2020-03-15 NOTE — Discharge Summary (Signed)
  Tanya Nolan, 44 y.o., female patient seen via tele psych by this provider, Dr. Dwyane Dee; and chart reviewed on 03/15/20.  On evaluation [name] is tearful and states that she is not doing well.  Reports that she has a lot of family stuff going on and that is what is causing her to have suicidal thoughts with a plan to cut her wrist.  Patient unable to contract for safety.  Recommended for inpatient psychiatric treatment and has been accepted to [facility].     Patient is to be transported to [facility] for inpatient psychiatric treatment Rm 305/1

## 2020-03-15 NOTE — Progress Notes (Signed)
Inpatient Diabetes Program Recommendations  AACE/ADA: New Consensus Statement on Inpatient Glycemic Control (2015)  Target Ranges:  Prepandial:   less than 140 mg/dL      Peak postprandial:   less than 180 mg/dL (1-2 hours)      Critically ill patients:  140 - 180 mg/dL   Lab Results  Component Value Date   GLUCAP 167 (H) 03/15/2020   HGBA1C 11.1 (A) 06/20/2019    Review of Glycemic Control  Diabetes history: DM2 Outpatient Diabetes medications: Basaglar 15 units QAM, Humalog 5 units tidwc Current orders for Inpatient glycemic control:  Lantus 15 units QAM, Novolog 0-15 units tidwc + 5 units tidwc  HgbA1C 11.1% - (06/20/19) Updated HgbA1C results pending.  Inpatient Diabetes Program Recommendations:     Agree with orders.  Will speak with pt in am regarding her diabetes control, monitoring and insulin.  Thank you. Lorenda Peck, RD, LDN, CDE Inpatient Diabetes Coordinator [phone]

## 2020-03-16 ENCOUNTER — Ambulatory Visit: Payer: No Typology Code available for payment source | Admitting: Family Medicine

## 2020-03-16 LAB — HEMOGLOBIN A1C
Hgb A1c MFr Bld: 7.6 % — ABNORMAL HIGH (ref 4.8–5.6)
Hgb A1c MFr Bld: 7.6 % — ABNORMAL HIGH (ref 4.8–5.6)
Mean Plasma Glucose: 171.42 mg/dL
Mean Plasma Glucose: 171.42 mg/dL

## 2020-03-16 LAB — GLUCOSE, CAPILLARY
Glucose-Capillary: 122 mg/dL — ABNORMAL HIGH (ref 70–99)
Glucose-Capillary: 104 mg/dL — ABNORMAL HIGH (ref 70–99)
Glucose-Capillary: 168 mg/dL — ABNORMAL HIGH (ref 70–99)
Glucose-Capillary: 123 mg/dL — ABNORMAL HIGH (ref 70–99)

## 2020-03-16 LAB — LIPID PANEL
Cholesterol: 211 mg/dL — ABNORMAL HIGH (ref 0–200)
HDL: 28 mg/dL — ABNORMAL LOW (ref >40)
LDL Cholesterol: 162 mg/dL — ABNORMAL HIGH (ref 0–99)
Total CHOL/HDL Ratio: 7.5 RATIO
Triglycerides: 103 mg/dL (ref <150)
VLDL: 21 mg/dL (ref 0–40)

## 2020-03-16 MED ORDER — ARIPIPRAZOLE 15 MG PO TABS
7.5000 mg | ORAL_TABLET | Freq: Every day | ORAL | Status: DC
  Administered 2020-03-17: 7.5 mg via ORAL
  Filled 2020-03-16 (×2): qty 1

## 2020-03-16 MED ORDER — TRAZODONE HCL 50 MG PO TABS
ORAL_TABLET | ORAL | Status: AC
  Filled 2020-03-16: qty 1

## 2020-03-16 MED ORDER — ARIPIPRAZOLE 2 MG PO TABS
2.0000 mg | ORAL_TABLET | Freq: Once | ORAL | Status: AC
  Administered 2020-03-16: 15:00:00 2 mg via ORAL
  Filled 2020-03-16 (×2): qty 1

## 2020-03-16 MED ORDER — SERTRALINE HCL 100 MG PO TABS
100.0000 mg | ORAL_TABLET | Freq: Every day | ORAL | Status: DC
  Administered 2020-03-17 – 2020-03-22 (×6): 100 mg via ORAL
  Filled 2020-03-16 (×7): qty 1

## 2020-03-16 MED ORDER — TRAZODONE HCL 50 MG PO TABS
50.0000 mg | ORAL_TABLET | Freq: Once | ORAL | Status: AC
  Administered 2020-03-16: 22:00:00 50 mg via ORAL
  Filled 2020-03-16 (×2): qty 1

## 2020-03-16 MED ORDER — HYDROXYZINE HCL 25 MG PO TABS
25.0000 mg | ORAL_TABLET | Freq: Once | ORAL | Status: AC
  Administered 2020-03-16: 25 mg via ORAL
  Filled 2020-03-16 (×2): qty 1

## 2020-03-16 MED ORDER — TRAZODONE HCL 50 MG PO TABS
50.0000 mg | ORAL_TABLET | ORAL | Status: AC
  Administered 2020-03-16: 50 mg via ORAL

## 2020-03-16 NOTE — Progress Notes (Signed)
Received insulin 2 units per sliding scale and 5 units  as scheduled.

## 2020-03-16 NOTE — Progress Notes (Signed)
   03/16/20 2300  Psych Admission Type (Psych Patients Only)  Admission Status Voluntary  Psychosocial Assessment  Patient Complaints None  Eye Contact Brief  Facial Expression Anxious  Affect Depressed  Speech Logical/coherent  Interaction Assertive  Motor Activity Slow  Appearance/Hygiene Unremarkable  Behavior Characteristics Cooperative  Mood Depressed  Thought Process  Coherency Circumstantial  Content WDL  Delusions WDL  Perception WDL  Hallucination None reported or observed  Judgment Poor  Confusion None  Danger to Self  Current suicidal ideation? Passive  Self-Injurious Behavior Some self-injurious ideation observed or expressed.  No lethal plan expressed   Agreement Not to Harm Self Yes  Description of Agreement verbal contract for safety  Danger to Others  Danger to Others None reported or observed

## 2020-03-16 NOTE — Progress Notes (Signed)
Recreation Therapy Notes  Animal-Assisted Activity (AAA) Program Checklist/Progress Notes Patient Eligibility Criteria Checklist & Daily Group note for Rec Tx Intervention  Date: 5.4.21 Time: 1430 Location: [place]   AAA/T Program Assumption of Risk Form signed by Teacher, music or Parent Legal Guardian  YES  Patient is free of allergies or sever asthma  YES   Patient reports no fear of animals  YES   Patient reports no history of cruelty to animals  YES   Patient understands his/her participation is voluntary YES   Patient washes hands before animal contact  YES  Patient washes hands after animal contact  YES   Education: Contractor, Appropriate Animal Interaction   Education Outcome: Acknowledges understanding/In group clarification offered/Needs additional education.   Clinical Observations/Feedback:  Pt did not attend group activity.    Tanya Nolan, LRT/CTRS         Tanya Nolan A 03/16/2020 3:26 PM

## 2020-03-16 NOTE — Progress Notes (Signed)
Psychoeducational Group Note  Date:  03/16/2020 Time:  2126  Group Topic/Focus:  Wrap-Up Group:   The focus of this group is to help patients review their daily goal of treatment and discuss progress on daily workbooks.  Participation Level: Did Not Attend  Participation Quality:  Not Applicable  Affect:  Not Applicable  Cognitive:  Not Applicable  Insight:  Not Applicable  Engagement in Group: Not Applicable  Additional Comments:  The patient did not attend group this evening.   Archie Balboa S 03/16/2020, 9:26 PM

## 2020-03-16 NOTE — Progress Notes (Signed)
   03/16/20 0830  Vital Signs  Temp 99 F (37.2 C)  Temp Source Oral  Pulse Rate 81  Pulse Rate Source Dinamap  BP (!) 142/85  BP Location Left Arm  BP Method Automatic  Patient Position (if appropriate) Sitting  D:  Patient presents with a depressed affect. Patient rated depression 7/10. Patient reported poor, restless sleep. Patient isolated in room and refused to go to pet therapy.  Patient did not want to say what was "wrong" with staff and did not make eye contact.  Patient's CBG was 104 at 1116, no sliding scale was needed.  A:  Patient took scheduled medicine.  Support and encouragement provided Routine safety checks conducted every 15 minutes. Patient  Informed to notify staff with any concerns.  Safety maintained. R:  No adverse drug reactions noted.  Patient contracts for safety.  Patient compliant with medication and treatment plan. Patient cooperative and calm. Patient interacts well with others on the unit.  Safety maintained.

## 2020-03-16 NOTE — Progress Notes (Addendum)
Patient continues to experience restlessness. Has not been able to sleep. Currently sitting in bed, pensive. BP 90/50. Given Gatorade and crackers. Will recheck in an hour. CBG 122.

## 2020-03-16 NOTE — Progress Notes (Signed)
Patient went to bed but remained restless and was refusing comfort medications. Remained awake and has just requested "something to help me sleep". Provider was contacted and Trazodone 50 mg given PO as ordered.

## 2020-03-16 NOTE — Progress Notes (Signed)
[facility] MD Progress Note  03/16/2020 11:43 AM Tanya Nolan  MRN:  [id number] Subjective:  I feel alright. Still hearing those voices, and Im really paranoia. Im having trouble sleeping and my mind is going going going.   Objective: 44 year old female, who presented to the ER via with worsening depression since the death of her husband and sister. She originally presented with nuero-vegetative symptoms of depression, sadness, anhedonia, insomnia, poor appetite, and rated her depression 10+/10.  Upon evaluation today she reports feeling slightly jumpy "on edge", and contributes this to the medication. She reports some sleep disturbances, that are chronic in nature. She reports intrusive thoughts and restless mind. She continues to endorse ongoing paranoia at this time. In relationship to her depression and anxiety she is rating them both 5-6/10 with 10 being the worse. She states her goal today is to identity triggers for anxiety and paranoia. She reports compliance with Abilify and Zoloft at this time. She endorses auditory hallucinations that she describes as a faint whisper, she does not appear to be responding to internal stimuli or preoccupied.    Principal Problem: MDD (major depressive disorder), recurrent severe, without psychosis ([place]) Diagnosis: Principal Problem:   MDD (major depressive disorder), recurrent severe, without psychosis ([place])  Total Time spent with patient: 30 minutes  Past Psychiatric History: History of anxiety and depression that have worsened since the death of her husband in 11/30/2019 and sister in January 2021. She reports history of one suicide attempt via cutting her wrist 1-2 weeks after her husband passed away. Denies prior hospitalizations.  Past Medical History:  Past Medical History:  Diagnosis Date  . Anxiety   . BACK STRAIN, LUMBAR 03/26/2009   Qualifier: Diagnosis of  By: Jerline Pain MD, Anderson Malta    . Chalazion 10/01/2007   Qualifier: Diagnosis of  By: Genene Churn  MD, Janett Billow    . Chronic back pain    tx with ibuprofen  . Diabetes mellitus   . Hypercholesteremia   . Hyperlipidemia    no meds - tx with diet  . Hypertension   . Hypothyroidism   . Ingrown toenail 12/25/2014  . 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   C&B WNL  NEG PAPS after  . Nipple discharge 10/13/2013  . Non compliance w medication regimen 10/13/2013  . 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 Thyroid dysfunction     Past Surgical History:  Procedure Laterality Date  . [place] DECOMPRESSION  2008  . HYSTEROSCOPY X 2  2007 / 2012   ENDOMETRIAL POLYPS REMOVED   Family History:  Family History  Problem Relation Age of Onset  . Hypertension Sister   . Lupus Mother   . Hypertension Mother   . Heart disease Father   . Gout Father   . Lupus Father    Family Psychiatric  History: Denies family history.  Social History:  Social History   Substance and Sexual Activity  Alcohol Use Yes  . Alcohol/week: 0.0 standard drinks   Comment: socially - wine/liquor--Rare     Social History   Substance and Sexual Activity  Drug Use No   Comment: past use "years ago" per patient    Social History   Socioeconomic History  . Marital status: Married    Spouse name: Not on file  . Number of children: Not on file  . Years of education: Not on file  . Highest education level: Not on file  Occupational History  . Not  on file  Tobacco Use  . Smoking status: Current Every Day Smoker    Packs/day: 0.50    Types: Cigarettes  . Smokeless tobacco: Never Used  Substance and Sexual Activity  . Alcohol use: Yes    Alcohol/week: 0.0 standard drinks    Comment: socially - wine/liquor--Rare  . Drug use: No    Comment: past use "years ago" per patient  . Sexual activity: Yes    Birth control/protection: None    Comment: 1st intercourse 44 yo-More than 5 partners  Other Topics Concern  . Not on file  Social History Narrative  . Not on  file   Social Determinants of Health   Financial Resource Strain:   . Difficulty of Paying Living Expenses:   Food Insecurity:   . Worried About Charity fundraiser in the Last Year:   . Arboriculturist in the Last Year:   Transportation Needs:   . Film/video editor (Medical):   Marland Kitchen Lack of Transportation (Non-Medical):   Physical Activity:   . Days of Exercise per Week:   . Minutes of Exercise per Session:   Stress:   . Feeling of Stress :   Social Connections:   . Frequency of Communication with Friends and Family:   . Frequency of Social Gatherings with Friends and Family:   . Attends Religious Services:   . Active Member of Clubs or Organizations:   . Attends Archivist Meetings:   Marland Kitchen Marital Status:    Additional Social History:     Sleep: Fair  Appetite:  Fair  Current Medications: Current Facility-Administered Medications  Medication Dose Route Frequency Provider Last Rate Last Admin  . acetaminophen (TYLENOL) tablet 650 mg  650 mg Oral Q4H PRN Rankin, Shuvon B, NP      . alum & mag hydroxide-simeth (MAALOX/MYLANTA) 200-200-20 MG/5ML suspension 30 mL  30 mL Oral Q6H PRN Rankin, Shuvon B, NP      . ARIPiprazole (ABILIFY) tablet 5 mg  5 mg Oral Daily Cobos, Myer Peer, MD   5 mg at 03/16/20 0829  . atorvastatin (LIPITOR) tablet 80 mg  80 mg Oral Daily Eugenie Filler, MD   80 mg at 03/16/20 [id number]  . enalapril (VASOTEC) tablet 10 mg  10 mg Oral Daily Cobos, Myer Peer, MD   10 mg at 03/16/20 0829  . insulin aspart (novoLOG) injection 0-15 Units  0-15 Units Subcutaneous TID WC Eugenie Filler, MD   2 Units at 03/16/20 [phone]  . insulin aspart (novoLOG) injection 5 Units  5 Units Subcutaneous TID WC Cobos, Myer Peer, MD   5 Units at 03/16/20 1134  . insulin glargine (LANTUS) injection 15 Units  15 Units Subcutaneous [id number] Cobos, Myer Peer, MD   15 Units at 03/16/20 0831  . levothyroxine (SYNTHROID) tablet 125 mcg  125 mcg Oral Q0600 Cobos, Myer Peer, MD    125 mcg at 03/16/20 0608  . loratadine (CLARITIN) tablet 10 mg  10 mg Oral Daily Eugenie Filler, MD   10 mg at 03/16/20 0829  . ondansetron (ZOFRAN) tablet 4 mg  4 mg Oral Q8H PRN Rankin, Shuvon B, NP      . polyethylene glycol (MIRALAX / GLYCOLAX) packet 17 g  17 g Oral Daily PRN Cobos, Fernando A, MD      . sertraline (ZOLOFT) tablet 50 mg  50 mg Oral Daily Cobos, Myer Peer, MD   50 mg at 03/16/20 0829  . traZODone (DESYREL) 50 MG  tablet             Lab Results:  Results for orders placed or performed during the hospital encounter of 03/15/20 (from the past 48 hour(s))  Glucose, capillary     Status: Abnormal   Collection Time: 03/15/20  4:29 PM  Result Value Ref Range   Glucose-Capillary 167 (H) 70 - 99 mg/dL    Comment: Glucose reference range applies only to samples taken after fasting for at least 8 hours.  Glucose, capillary     Status: Abnormal   Collection Time: 03/15/20  8:20 PM  Result Value Ref Range   Glucose-Capillary 104 (H) 70 - 99 mg/dL    Comment: Glucose reference range applies only to samples taken after fasting for at least 8 hours.  Glucose, capillary     Status: Abnormal   Collection Time: 03/16/20  5:54 AM  Result Value Ref Range   Glucose-Capillary 122 (H) 70 - 99 mg/dL    Comment: Glucose reference range applies only to samples taken after fasting for at least 8 hours.   Comment 1 Notify RN    Comment 2 Document in Chart   Hemoglobin A1c     Status: Abnormal   Collection Time: 03/16/20  6:21 AM  Result Value Ref Range   Hgb A1c MFr Bld 7.6 (H) 4.8 - 5.6 %    Comment: (NOTE) Pre diabetes:          5.7%-6.4% Diabetes:              >6.4% Glycemic control for   <7.0% adults with diabetes    Mean Plasma Glucose 171.42 mg/dL    Comment: Performed at [facility] [address], [place], [place] [address]  Hemoglobin A1c     Status: Abnormal   Collection Time: 03/16/20  6:21 AM  Result Value Ref Range   Hgb A1c MFr Bld 7.6 (H) 4.8 - 5.6 %     Comment: (NOTE) Pre diabetes:          5.7%-6.4% Diabetes:              >6.4% Glycemic control for   <7.0% adults with diabetes    Mean Plasma Glucose 171.42 mg/dL    Comment: Performed at [facility] [address], [place], [place] [address]  Lipid panel     Status: Abnormal   Collection Time: 03/16/20  6:21 AM  Result Value Ref Range   Cholesterol 211 (H) 0 - 200 mg/dL   Triglycerides 103 <150 mg/dL   HDL 28 (L) >40 mg/dL   Total CHOL/HDL Ratio 7.5 RATIO   VLDL 21 0 - 40 mg/dL   LDL Cholesterol 162 (H) 0 - 99 mg/dL    Comment:        Total Cholesterol/HDL:CHD Risk Coronary Heart Disease Risk Table                     Men   Women  1/2 Average Risk   3.4   3.3  Average Risk       5.0   4.4  2 X Average Risk   9.6   7.1  3 X Average Risk  23.4   11.0        Use the calculated Patient Ratio above and the CHD Risk Table to determine the patient's CHD Risk.        ATP III CLASSIFICATION (LDL):  <100     mg/dL   Optimal  100-129  mg/dL   Near or Above                    Optimal  130-159  mg/dL   Borderline  160-189  mg/dL   High  >190     mg/dL   Very High Performed at [facility] [address] [place], [place] [address]   Glucose, capillary     Status: Abnormal   Collection Time: 03/16/20 11:15 AM  Result Value Ref Range   Glucose-Capillary 104 (H) 70 - 99 mg/dL    Comment: Glucose reference range applies only to samples taken after fasting for at least 8 hours.    Blood Alcohol level:  Lab Results  Component Value Date   ETH <10 [id number]    Metabolic Disorder Labs: Lab Results  Component Value Date   HGBA1C 7.6 (H) 03/16/2020   HGBA1C 7.6 (H) 03/16/2020   MPG 171.42 03/16/2020   MPG 171.42 03/16/2020   No results found for: PROLACTIN Lab Results  Component Value Date   CHOL 211 (H) 03/16/2020   TRIG 103 03/16/2020   HDL 28 (L) 03/16/2020   CHOLHDL 7.5 03/16/2020   VLDL 21 03/16/2020   LDLCALC 162 (H) 03/16/2020    LDLCALC 175 (H) 06/20/2019    Physical Findings: AIMS: Facial and Oral Movements Muscles of Facial Expression: None, normal Lips and Perioral Area: None, normal Jaw: None, normal Tongue: None, normal,Extremity Movements Upper (arms, wrists, hands, fingers): None, normal Lower (legs, knees, ankles, toes): None, normal, Trunk Movements Neck, shoulders, hips: None, normal, Overall Severity Severity of abnormal movements (highest score from questions above): None, normal Incapacitation due to abnormal movements: None, normal Patient's awareness of abnormal movements (rate only patient's report): No Awareness, Dental Status Current problems with teeth and/or dentures?: No Does patient usually wear dentures?: No  CIWA:    COWS:     Musculoskeletal: Strength & Muscle Tone: within normal limits Gait & Station: normal Patient leans: N/A  Psychiatric Specialty Exam: Physical Exam  Review of Systems  Blood pressure 139/78, pulse 74, temperature 97.9 F (36.6 C), temperature source Oral, resp. rate 16, height 5\' 6"  (1.676 m), weight 104.3 kg, SpO2 99 %.Body mass index is 37.12 kg/m.  General Appearance: Fairly Groomed and wearing a mask   Eye Contact:  Fair  Speech:  Clear and Coherent and Normal Rate  Volume:  Normal  Mood:  Anxious, Depressed and on edge  Affect:  Depressed and Flat  Thought Process:  Linear and Descriptions of Associations: Circumstantial  Orientation:  Full (Time, Place, and Person)  Thought Content:  Logical, Hallucinations: Auditory and Paranoid Ideation  Suicidal Thoughts:  No  Homicidal Thoughts:  No  Memory:  Immediate;   Fair Recent;   Fair  Judgement:  Intact  Insight:  Shallow  Psychomotor Activity:  Restlessness  Concentration:  Concentration: Fair and Attention Span: Fair  Recall:  [employer] of Knowledge:  Fair  Language:  Fair  Akathisia:  No  Handed:  Right  AIMS (if indicated):     Assets:  Communication Skills Desire for  Improvement Financial Resources/Insurance Housing Leisure Time Physical Health Resilience Social Support Vocational/Educational  ADL's:  Intact  Cognition:  WNL  Sleep:        Treatment Plan Summary: Daily contact with patient to assess and evaluate symptoms and progress in treatment, Medication management and Plan Increase Abilify 7.5mg  po daily to help with paranoia and hallucinations. Will increase zoloft 100mg  tomorrow  to target symptoms of depression and anxiety.   Suella Broad, FNP 03/16/2020, 11:43 AM

## 2020-03-17 DIAGNOSIS — F332 Major depressive disorder, recurrent severe without psychotic features: Principal | ICD-10-CM

## 2020-03-17 LAB — GLUCOSE, CAPILLARY
Glucose-Capillary: 107 mg/dL — ABNORMAL HIGH (ref 70–99)
Glucose-Capillary: 123 mg/dL — ABNORMAL HIGH (ref 70–99)
Glucose-Capillary: 140 mg/dL — ABNORMAL HIGH (ref 70–99)
Glucose-Capillary: 136 mg/dL — ABNORMAL HIGH (ref 70–99)

## 2020-03-17 MED ORDER — QUETIAPINE FUMARATE 100 MG PO TABS
100.0000 mg | ORAL_TABLET | Freq: Every day | ORAL | Status: DC
  Administered 2020-03-17: 22:00:00 100 mg via ORAL
  Filled 2020-03-17 (×2): qty 1

## 2020-03-17 MED ORDER — QUETIAPINE FUMARATE 25 MG PO TABS
25.0000 mg | ORAL_TABLET | Freq: Two times a day (BID) | ORAL | Status: DC
  Administered 2020-03-17 – 2020-03-20 (×6): 25 mg via ORAL
  Filled 2020-03-17 (×10): qty 1

## 2020-03-17 MED ORDER — TRAZODONE HCL 50 MG PO TABS
50.0000 mg | ORAL_TABLET | Freq: Every evening | ORAL | Status: DC | PRN
  Administered 2020-03-18 – 2020-03-21 (×4): 50 mg via ORAL
  Filled 2020-03-17 (×5): qty 1

## 2020-03-17 NOTE — Progress Notes (Signed)
Recreation Therapy Notes  Date:  5.5.21 Time: 0930 Location: [address] Group Room  Group Topic: Stress Management  Goal Area(s) Addresses:  Patient will identify positive stress management techniques. Patient will identify benefits of using stress management post d/c.  Intervention: Stress Management  Activity :  Progressive Muscle Relaxation.  LRT read a script that guided patients in tensing and relaxing each muscle group individually.  Patients were to listen and follow along as script was read to engage in activity.  Education:  Stress Management, Discharge Planning.   Education Outcome: Acknowledges Education  Clinical Observations/Feedback: Pt did not attend activity.    Victorino Sparrow, LRT/CTRS         Victorino Sparrow A 03/17/2020 12:02 PM
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
## 2020-03-17 NOTE — [facility] Group Notes (Signed)
03/17/2020 8:45am Type of Group and Topic: Psychoeducational Group: Discharge Planning  Participation Level: Did Not Attend  Description of Group Discharge planning group reviews patient's anticipated discharge plans and assists patients to anticipate and address any barriers to wellness/recovery in the community. Suicide prevention education is reviewed with patients in group. Therapeutic Goals 1. Patients will state their anticipated discharge plan and mental health aftercare 2. Patients will identify potential barriers to wellness in the community setting 3. Patients will engage in problem solving, solution focused discussion of ways to anticipate and address barriers to wellness/recovery   Summary of Patient Progress Plan for Discharge/Comments:  Invited, chose not to attend.     Radonna Ricker, MSW, [place] Worker [facility]  Phone: [phone] 03/17/2020 2:31 PM

## 2020-03-17 NOTE — [facility] Suicide Risk Assessment (Signed)
[place] INPATIENT:  Family/Significant Other Suicide Prevention Education  Suicide Prevention Education:  Contact Attempts: mother, [name] [phone]) has been identified by the patient as the family member/significant other with whom the patient will be residing, and identified as the person(s) who will aid the patient in the event of a mental health crisis.  With written consent from the patient, two attempts were made to provide suicide prevention education, prior to and/or following the patient's discharge.  We were unsuccessful in providing suicide prevention education.  A suicide education pamphlet was given to the patient to share with family/significant other.  Date and time of first attempt: 03/17/2020 at 2:42pm. No option to leave a message. Date and time of second attempt: needs attempt  Joellen Jersey 03/17/2020, 2:43 PM

## 2020-03-17 NOTE — Progress Notes (Signed)
[place] Group Notes:  (Nursing/MHT/Case Management/Adjunct)  Date:  03/17/2020  Time:  2030  Type of Therapy:  wrap up group  Participation Level:  Active  Participation Quality:  Appropriate, Attentive, Sharing and Supportive  Affect:  Blunted and Depressed  Cognitive:  Alert  Insight:  Improving  Engagement in Group:  Engaged  Modes of Intervention:  Clarification, Education and Support  Summary of Progress/Problems: Positive thinking and positive change were discussed.   Shellia Cleverly 03/17/2020, 9:12 PM

## 2020-03-17 NOTE — Tx Team (Signed)
Interdisciplinary Treatment and Diagnostic Plan Update  03/17/2020 Time of Session: 9:20am Tanya Nolan MRN: [id number]  Principal Diagnosis: MDD (major depressive disorder), recurrent severe, without psychosis ([place])  Secondary Diagnoses: Principal Problem:   MDD (major depressive disorder), recurrent severe, without psychosis ([place])   Current Medications:  Current Facility-Administered Medications  Medication Dose Route Frequency Provider Last Rate Last Admin  . acetaminophen (TYLENOL) tablet 650 mg  650 mg Oral Q4H PRN Rankin, Shuvon B, NP      . alum & mag hydroxide-simeth (MAALOX/MYLANTA) 200-200-20 MG/5ML suspension 30 mL  30 mL Oral Q6H PRN Rankin, Shuvon B, NP      . ARIPiprazole (ABILIFY) tablet 7.5 mg  7.5 mg Oral Daily Starkes-Perry, Gayland Curry, FNP      . atorvastatin (LIPITOR) tablet 80 mg  80 mg Oral Daily Eugenie Filler, MD   80 mg at 03/16/20 1740  . enalapril (VASOTEC) tablet 10 mg  10 mg Oral Daily Cobos, Myer Peer, MD   10 mg at 03/16/20 0829  . insulin aspart (novoLOG) injection 0-15 Units  0-15 Units Subcutaneous TID WC Eugenie Filler, MD   3 Units at 03/16/20 1723  . insulin aspart (novoLOG) injection 5 Units  5 Units Subcutaneous TID WC Cobos, Myer Peer, MD   5 Units at 03/16/20 1725  . insulin glargine (LANTUS) injection 15 Units  15 Units Subcutaneous BH-[id number] Cobos, Myer Peer, MD   15 Units at 03/16/20 0831  . levothyroxine (SYNTHROID) tablet 125 mcg  125 mcg Oral Q0600 Cobos, Myer Peer, MD   125 mcg at 03/16/20 0608  . loratadine (CLARITIN) tablet 10 mg  10 mg Oral Daily Eugenie Filler, MD   10 mg at 03/16/20 0829  . ondansetron (ZOFRAN) tablet 4 mg  4 mg Oral Q8H PRN Rankin, Shuvon B, NP      . polyethylene glycol (MIRALAX / GLYCOLAX) packet 17 g  17 g Oral Daily PRN Cobos, Fernando A, MD      . sertraline (ZOLOFT) tablet 100 mg  100 mg Oral Daily Starkes-Perry, Gayland Curry, FNP       PTA Medications: Medications Prior to Admission  Medication Sig  Dispense Refill Last Dose  . Accu-Chek FastClix Lancets MISC Use to check CBG TID 100 each 12   . acetaminophen (TYLENOL) 325 MG tablet Take 650 mg by mouth every 6 (six) hours as needed for mild pain or moderate pain.     Marland Kitchen albuterol (PROVENTIL HFA;VENTOLIN HFA) 108 (90 Base) MCG/ACT inhaler Inhale 2 puffs into the lungs every 6 (six) hours as needed for wheezing or shortness of breath. (Patient not taking: Reported on 06/20/2019) 1 Inhaler 0   . atorvastatin (LIPITOR) 80 MG tablet Take 1 tablet (80 mg total) by mouth daily. (Patient not taking: Reported on 08/01/2019) 90 tablet 1   . BD ULTRA-FINE PEN NEEDLES 29G X 12.7MM MISC USE THREE TIMES A DAY BEFORE MEALS 1 each 2   . Blood Glucose Monitoring Suppl (ACCU-CHEK GUIDE) w/Device KIT 1 applicator by Does not apply route 3 (three) times daily. Use to check CBG TID 1 kit 0   . cetirizine (ZYRTEC) 10 MG tablet Take 1 tablet (10 mg total) by mouth daily. (Patient not taking: Reported on 06/20/2019) 30 tablet 11   . clonazePAM (KLONOPIN) 0.5 MG tablet Take 1 tablet (0.5 mg total) by mouth 2 (two) times daily as needed for anxiety. (Patient not taking: Reported on 03/14/2020) 20 tablet 0   . enalapril (VASOTEC) 10 MG  tablet Take 1 tablet (10 mg total) by mouth daily. (Patient not taking: Reported on 08/01/2019) 90 tablet 1   . glucose blood test strip Use to check GBG TID 100 each 12   . Guaifenesin 1200 MG TB12 Take 1 tablet (1,200 mg total) by mouth 2 (two) times daily. (Patient not taking: Reported on 03/14/2020) 20 tablet 0   . Insulin Glargine (BASAGLAR KWIKPEN) 100 UNIT/ML SOPN Inject 0.15 mLs (15 Units total) into the skin every morning. (Patient not taking: Reported on 03/14/2020) 3 mL 11   . insulin lispro (HUMALOG KWIKPEN) 100 UNIT/ML KwikPen Inject 0.05 mLs (5 Units total) into the skin 3 (three) times daily. (Patient not taking: Reported on 03/14/2020) 30 mL 1   . levothyroxine (SYNTHROID) 125 MCG tablet Take 1 tablet (125 mcg total) by mouth daily before  breakfast. Reported on 02/23/2016 (Patient not taking: Reported on 08/01/2019) 90 tablet 1   . meloxicam (MOBIC) 15 MG tablet Take 1 tablet (15 mg total) by mouth daily as needed. (Patient not taking: Reported on 03/14/2020) 30 tablet 1   . ondansetron (ZOFRAN-ODT) 4 MG disintegrating tablet Take 1 tablet (4 mg total) by mouth every 8 (eight) hours as needed for nausea or vomiting. (Patient not taking: Reported on 08/01/2019) 20 tablet 0   . polyethylene glycol powder (GLYCOLAX/MIRALAX) 17 GM/SCOOP powder Take 17 g by mouth daily as needed. (Patient not taking: Reported on 08/01/2019) 3350 g 1   . promethazine-dextromethorphan (PROMETHAZINE-DM) 6.25-15 MG/5ML syrup Take 5 mLs by mouth 4 (four) times daily as needed for cough. (Patient not taking: Reported on 03/14/2020) 240 mL 0   . sertraline (ZOLOFT) 50 MG tablet Take 1 tablet (50 mg total) by mouth daily. (Patient not taking: Reported on 03/14/2020) 90 tablet 0   . triamcinolone ointment (KENALOG) 0.5 % Apply 1 application topically 2 (two) times daily. (Patient not taking: Reported on 03/14/2020) 30 g 0     Patient Stressors: Health problems Loss of significant other. Medication change or noncompliance  Patient Strengths: Ability for insight Capable of independent living Communication skills Supportive family/friends  Treatment Modalities: Medication Management, Group therapy, Case management,  1 to 1 session with clinician, Psychoeducation, Recreational therapy.   Physician Treatment Plan for Primary Diagnosis: MDD (major depressive disorder), recurrent severe, without psychosis ([place]) Long Term Goal(s): Improvement in symptoms so as ready for discharge Improvement in symptoms so as ready for discharge   Short Term Goals: Ability to identify changes in lifestyle to reduce recurrence of condition will improve Ability to verbalize feelings will improve Ability to disclose and discuss suicidal ideas Ability to demonstrate self-control will  improve Ability to identify and develop effective coping behaviors will improve  Medication Management: Evaluate patient's response, side effects, and tolerance of medication regimen.  Therapeutic Interventions: 1 to 1 sessions, Unit Group sessions and Medication administration.  Evaluation of Outcomes: Not Met  Physician Treatment Plan for Secondary Diagnosis: Principal Problem:   MDD (major depressive disorder), recurrent severe, without psychosis ([place])  Long Term Goal(s): Improvement in symptoms so as ready for discharge Improvement in symptoms so as ready for discharge   Short Term Goals: Ability to identify changes in lifestyle to reduce recurrence of condition will improve Ability to verbalize feelings will improve Ability to disclose and discuss suicidal ideas Ability to demonstrate self-control will improve Ability to identify and develop effective coping behaviors will improve     Medication Management: Evaluate patient's response, side effects, and tolerance of medication regimen.  Therapeutic Interventions: 1  to 1 sessions, Unit Group sessions and Medication administration.  Evaluation of Outcomes: Not Met   RN Treatment Plan for Primary Diagnosis: MDD (major depressive disorder), recurrent severe, without psychosis ([place]) Long Term Goal(s): Knowledge of disease and therapeutic regimen to maintain health will improve  Short Term Goals: Ability to verbalize feelings will improve, Ability to disclose and discuss suicidal ideas, Ability to identify and develop effective coping behaviors will improve and Compliance with prescribed medications will improve  Medication Management: RN will administer medications as ordered by provider, will assess and evaluate patient's response and provide education to patient for prescribed medication. RN will report any adverse and/or side effects to prescribing provider.  Therapeutic Interventions: 1 on 1 counseling sessions,  Psychoeducation, Medication administration, Evaluate responses to treatment, Monitor vital signs and CBGs as ordered, Perform/monitor CIWA, COWS, AIMS and Fall Risk screenings as ordered, Perform wound care treatments as ordered.  Evaluation of Outcomes: Not Met   LCSW Treatment Plan for Primary Diagnosis: MDD (major depressive disorder), recurrent severe, without psychosis ([place]) Long Term Goal(s): Safe transition to appropriate next level of care at discharge, Engage patient in therapeutic group addressing interpersonal concerns.  Short Term Goals: Engage patient in aftercare planning with referrals and resources  Therapeutic Interventions: Assess for all discharge needs, 1 to 1 time with Social worker, Explore available resources and support systems, Assess for adequacy in community support network, Educate family and significant other(s) on suicide prevention, Complete Psychosocial Assessment, Interpersonal group therapy.  Evaluation of Outcomes: Not Met   Progress in Treatment: Attending groups: No. Participating in groups: No. Taking medication as prescribed: Yes. Toleration medication: Yes. Family/Significant other contact made: No, will contact:  if patient consents to collateral contacts Patient understands diagnosis: Yes. Discussing patient identified problems/goals with staff: Yes. Medical problems stabilized or resolved: Yes. Denies suicidal/homicidal ideation: No. Passive SI Issues/concerns per patient self-inventory: No. Other:   New problem(s) identified: None   New Short Term/Long Term Goal(s): medication stabilization, elimination of SI thoughts, development of comprehensive mental wellness plan.    Patient Goals:  "Help me with the thoughts in my head"   Discharge Plan or Barriers: Patient recently admitted. CSW will continue to follow and assess for appropriate referrals and possible discharge planning.    Reason for Continuation of Hospitalization:  Depression Medication stabilization Suicidal ideation  Estimated Length of Stay: 3-5 days   Attendees: Patient: Tanya Nolan  03/17/2020 8:51 AM  Physician:  03/17/2020 8:51 AM  Nursing:  03/17/2020 8:51 AM  RN Care Manager: 03/17/2020 8:51 AM  Social Worker: Radonna Ricker, LCSW 03/17/2020 8:51 AM  Recreational Therapist:  03/17/2020 8:51 AM  Other: Marvia Pickles, NP 03/17/2020 8:51 AM  Other:  03/17/2020 8:51 AM  Other: 03/17/2020 8:51 AM    Scribe for Treatment Team: Marylee Floras, [place] 03/17/2020 8:51 AM

## 2020-03-17 NOTE — Progress Notes (Signed)
   03/17/20 2232  Psych Admission Type (Psych Patients Only)  Admission Status Voluntary  Psychosocial Assessment  Patient Complaints Depression  Facial Expression Flat  Affect Appropriate to circumstance  Speech Logical/coherent  Interaction Assertive  Appearance/Hygiene Unremarkable  Behavior Characteristics Cooperative;Appropriate to situation  Mood Depressed  Thought Process  Coherency WDL  Content WDL  Delusions WDL;Other (Comment) (Patient reports some paranoia at times but not noted during interactions)  Perception WDL  Hallucination None reported or observed  Judgment WDL  Confusion WDL  Danger to Self  Current suicidal ideation? Denies  Danger to Others  Danger to Others None reported or observed

## 2020-03-17 NOTE — Progress Notes (Signed)
Pt came out asking for something else to help her sleep. Pt came out after 2 am , so pt informed it was too late to ask for more sleep medication tonight. Pt encouraged to ask dr to up the dose of Trazodone.

## 2020-03-17 NOTE — Progress Notes (Signed)
Pt CBG 107, pt supposed to get 5 u standing Novolog. Pt stated she did not know if she was going to eat breakfast so her insulin was held.

## 2020-03-17 NOTE — Progress Notes (Signed)
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  Patient reports today that she is feeling very depressed because today is her sister's birthday.  She states that her sister died 69 years ago she just realized that it was her birthday at this point it has made her feel very depressed today.  She reports that she has not been sleeping well and that has been mainly due to her having some paranoia.  She states that she hears noises at night that wake her up and it startles her and she has extreme difficulty going back to sleep.  She states that the noise that she hears are scary to her.  She is unable to distinguish whether these are due to dreams that she is having or with her actually noises in the hallway.  She reports that she agrees that if she was able to get her paranoia under control and sleep a little better that her depression may improve.  She states that the paranoia makes her feel unsafe, but she states that she does not feel that she will harm herself or harm anyone else.  She denies any suicidal or homicidal ideations and she denies having any active hallucinations.  Principal Problem: MDD (major depressive disorder), recurrent severe, without psychosis ([place]) Diagnosis: Principal Problem:   MDD (major depressive disorder), recurrent severe, without psychosis ([place])  Total Time spent with patient: 30 minutes  Past Psychiatric History: History of anxiety and depression that have worsened since the death of her husband in November 15, 2019 and sister in January 2021. She reports history of one suicide attempt via cutting her wrist 1-2 weeks after her husband passed away. Denies prior hospitalizations.  Past Medical History:  Past Medical History:  Diagnosis Date  . Anxiety   . BACK STRAIN, LUMBAR 03/26/2009   Qualifier: Diagnosis of  By: Jerline Pain MD, Anderson Malta    . Chalazion  10/01/2007   Qualifier: Diagnosis of  By: Genene Churn MD, Janett Billow    . Chronic back pain    tx with ibuprofen  . Diabetes mellitus   . Hypercholesteremia   . Hyperlipidemia    no meds - tx with diet  . Hypertension   . Hypothyroidism   . Ingrown toenail 12/25/2014  . 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   C&B WNL  NEG PAPS after  . Nipple discharge 10/13/2013  . Non compliance w medication regimen 10/13/2013  . 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 Thyroid dysfunction     Past Surgical History:  Procedure Laterality Date  . [place] DECOMPRESSION  2008  . HYSTEROSCOPY X 2  2007 / 2012   ENDOMETRIAL POLYPS REMOVED   Family History:  Family History  Problem Relation Age of Onset  . Hypertension Sister   . Lupus Mother   . Hypertension Mother   . Heart disease Father   . Gout Father   . Lupus Father    Family Psychiatric  History: Denies Social History:  Social History   Substance and Sexual Activity  Alcohol Use Yes  . Alcohol/week: 0.0 standard drinks   Comment: socially - wine/liquor--Rare     Social History   Substance and Sexual Activity  Drug Use No   Comment: past use "years ago" per patient    Social History   Socioeconomic History  . Marital status: Married    Spouse  name: Not on file  . Number of children: Not on file  . Years of education: Not on file  . Highest education level: Not on file  Occupational History  . Not on file  Tobacco Use  . Smoking status: Current Every Day Smoker    Packs/day: 0.50    Types: Cigarettes  . Smokeless tobacco: Never Used  Substance and Sexual Activity  . Alcohol use: Yes    Alcohol/week: 0.0 standard drinks    Comment: socially - wine/liquor--Rare  . Drug use: No    Comment: past use "years ago" per patient  . Sexual activity: Yes    Birth control/protection: None    Comment: 1st intercourse 44 yo-More than 5 partners  Other Topics Concern  . Not on file   Social History Narrative  . Not on file   Social Determinants of Health   Financial Resource Strain:   . Difficulty of Paying Living Expenses:   Food Insecurity:   . Worried About Charity fundraiser in the Last Year:   . Arboriculturist in the Last Year:   Transportation Needs:   . Film/video editor (Medical):   Marland Kitchen Lack of Transportation (Non-Medical):   Physical Activity:   . Days of Exercise per Week:   . Minutes of Exercise per Session:   Stress:   . Feeling of Stress :   Social Connections:   . Frequency of Communication with Friends and Family:   . Frequency of Social Gatherings with Friends and Family:   . Attends Religious Services:   . Active Member of Clubs or Organizations:   . Attends Archivist Meetings:   Marland Kitchen Marital Status:    Additional Social History:                         Sleep: Fair  Appetite:  Good  Current Medications: Current Facility-Administered Medications  Medication Dose Route Frequency Provider Last Rate Last Admin  . acetaminophen (TYLENOL) tablet 650 mg  650 mg Oral Q4H PRN Rankin, Shuvon B, NP      . alum & mag hydroxide-simeth (MAALOX/MYLANTA) 200-200-20 MG/5ML suspension 30 mL  30 mL Oral Q6H PRN Rankin, Shuvon B, NP      . atorvastatin (LIPITOR) tablet 80 mg  80 mg Oral Daily Eugenie Filler, MD   80 mg at 03/17/20 0901  . enalapril (VASOTEC) tablet 10 mg  10 mg Oral Daily Cobos, Myer Peer, MD   10 mg at 03/17/20 0902  . insulin aspart (novoLOG) injection 0-15 Units  0-15 Units Subcutaneous TID WC Eugenie Filler, MD   3 Units at 03/16/20 1723  . insulin aspart (novoLOG) injection 5 Units  5 Units Subcutaneous TID WC Cobos, Myer Peer, MD   5 Units at 03/16/20 1725  . insulin glargine (LANTUS) injection 15 Units  15 Units Subcutaneous [id number] Cobos, Myer Peer, MD   15 Units at 03/17/20 [phone]  . levothyroxine (SYNTHROID) tablet 125 mcg  125 mcg Oral Q0600 Cobos, Myer Peer, MD   125 mcg at 03/17/20 0902  .  loratadine (CLARITIN) tablet 10 mg  10 mg Oral Daily Eugenie Filler, MD   10 mg at 03/17/20 0902  . ondansetron (ZOFRAN) tablet 4 mg  4 mg Oral Q8H PRN Rankin, Shuvon B, NP      . polyethylene glycol (MIRALAX / GLYCOLAX) packet 17 g  17 g Oral Daily PRN Cobos, Myer Peer, MD      .  QUEtiapine (SEROQUEL) tablet 100 mg  100 mg Oral QHS Hilery Wintle, Lowry Ram, FNP      . QUEtiapine (SEROQUEL) tablet 25 mg  25 mg Oral BID Jackelyne Sayer, Lowry Ram, FNP      . sertraline (ZOLOFT) tablet 100 mg  100 mg Oral Daily Suella Broad, FNP   100 mg at 03/17/20 0901  . traZODone (DESYREL) tablet 50 mg  50 mg Oral QHS PRN Kawon Willcutt, Lowry Ram, FNP        Lab Results:  Results for orders placed or performed during the hospital encounter of 03/15/20 (from the past 48 hour(s))  Glucose, capillary     Status: Abnormal   Collection Time: 03/15/20  4:29 PM  Result Value Ref Range   Glucose-Capillary 167 (H) 70 - 99 mg/dL    Comment: Glucose reference range applies only to samples taken after fasting for at least 8 hours.  Glucose, capillary     Status: Abnormal   Collection Time: 03/15/20  8:20 PM  Result Value Ref Range   Glucose-Capillary 104 (H) 70 - 99 mg/dL    Comment: Glucose reference range applies only to samples taken after fasting for at least 8 hours.  Glucose, capillary     Status: Abnormal   Collection Time: 03/16/20  5:54 AM  Result Value Ref Range   Glucose-Capillary 122 (H) 70 - 99 mg/dL    Comment: Glucose reference range applies only to samples taken after fasting for at least 8 hours.   Comment 1 Notify RN    Comment 2 Document in Chart   Hemoglobin A1c     Status: Abnormal   Collection Time: 03/16/20  6:21 AM  Result Value Ref Range   Hgb A1c MFr Bld 7.6 (H) 4.8 - 5.6 %    Comment: (NOTE) Pre diabetes:          5.7%-6.4% Diabetes:              >6.4% Glycemic control for   <7.0% adults with diabetes    Mean Plasma Glucose 171.42 mg/dL    Comment: Performed at [facility] [address], [place], [place] [address]  Hemoglobin A1c     Status: Abnormal   Collection Time: 03/16/20  6:21 AM  Result Value Ref Range   Hgb A1c MFr Bld 7.6 (H) 4.8 - 5.6 %    Comment: (NOTE) Pre diabetes:          5.7%-6.4% Diabetes:              >6.4% Glycemic control for   <7.0% adults with diabetes    Mean Plasma Glucose 171.42 mg/dL    Comment: Performed at [facility] [address] [place], [place] [address]  Lipid panel     Status: Abnormal   Collection Time: 03/16/20  6:21 AM  Result Value Ref Range   Cholesterol 211 (H) 0 - 200 mg/dL   Triglycerides 103 <150 mg/dL   HDL 28 (L) >40 mg/dL   Total CHOL/HDL Ratio 7.5 RATIO   VLDL 21 0 - 40 mg/dL   LDL Cholesterol 162 (H) 0 - 99 mg/dL    Comment:        Total Cholesterol/HDL:CHD Risk Coronary Heart Disease Risk Table                     Men   Women  1/2 Average Risk   3.4   3.3  Average Risk  5.0   4.4  2 X Average Risk   9.6   7.1  3 X Average Risk  23.4   11.0        Use the calculated Patient Ratio above and the CHD Risk Table to determine the patient's CHD Risk.        ATP III CLASSIFICATION (LDL):  <100     mg/dL   Optimal  100-129  mg/dL   Near or Above                    Optimal  130-159  mg/dL   Borderline  160-189  mg/dL   High  >190     mg/dL   Very High Performed at [facility] [address], [place], [place] [address]   Glucose, capillary     Status: Abnormal   Collection Time: 03/16/20 11:15 AM  Result Value Ref Range   Glucose-Capillary 104 (H) 70 - 99 mg/dL    Comment: Glucose reference range applies only to samples taken after fasting for at least 8 hours.  Glucose, capillary     Status: Abnormal   Collection Time: 03/16/20  4:20 PM  Result Value Ref Range   Glucose-Capillary 168 (H) 70 - 99 mg/dL    Comment: Glucose reference range applies only to samples taken after fasting for at least 8 hours.  Glucose, capillary     Status: Abnormal   Collection Time:  03/16/20  7:58 PM  Result Value Ref Range   Glucose-Capillary 123 (H) 70 - 99 mg/dL    Comment: Glucose reference range applies only to samples taken after fasting for at least 8 hours.   Comment 1 Notify RN    Comment 2 Document in Chart   Glucose, capillary     Status: Abnormal   Collection Time: 03/17/20  5:56 AM  Result Value Ref Range   Glucose-Capillary 107 (H) 70 - 99 mg/dL    Comment: Glucose reference range applies only to samples taken after fasting for at least 8 hours.   Comment 1 Notify RN    Comment 2 Document in Chart     Blood Alcohol level:  Lab Results  Component Value Date   ETH <10 [id number]    Metabolic Disorder Labs: Lab Results  Component Value Date   HGBA1C 7.6 (H) 03/16/2020   HGBA1C 7.6 (H) 03/16/2020   MPG 171.42 03/16/2020   MPG 171.42 03/16/2020   No results found for: PROLACTIN Lab Results  Component Value Date   CHOL 211 (H) 03/16/2020   TRIG 103 03/16/2020   HDL 28 (L) 03/16/2020   CHOLHDL 7.5 03/16/2020   VLDL 21 03/16/2020   LDLCALC 162 (H) 03/16/2020   LDLCALC 175 (H) 06/20/2019    Physical Findings: AIMS: Facial and Oral Movements Muscles of Facial Expression: None, normal Lips and Perioral Area: None, normal Jaw: None, normal Tongue: None, normal,Extremity Movements Upper (arms, wrists, hands, fingers): None, normal Lower (legs, knees, ankles, toes): None, normal, Trunk Movements Neck, shoulders, hips: None, normal, Overall Severity Severity of abnormal movements (highest score from questions above): None, normal Incapacitation due to abnormal movements: None, normal Patient's awareness of abnormal movements (rate only patient's report): No Awareness, Dental Status Current problems with teeth and/or dentures?: No Does patient usually wear dentures?: No  CIWA:    COWS:     Musculoskeletal: Strength & Muscle Tone: within normal limits Gait & Station: normal Patient leans: N/A  Psychiatric Specialty Exam: Physical Exam  Nursing note and vitals reviewed. Constitutional: She is oriented to person, place, and time. She appears well-developed and well-nourished.  Respiratory: Effort normal.  Musculoskeletal:        General: Normal range of motion.  Neurological: She is alert and oriented to person, place, and time.  Skin: Skin is warm.  Psychiatric: She exhibits a depressed mood.    Review of Systems  Constitutional: Negative.   HENT: Negative.   Eyes: Negative.   Respiratory: Negative.   Cardiovascular: Negative.   Gastrointestinal: Negative.   Genitourinary: Negative.   Musculoskeletal: Negative.   Skin: Negative.   Neurological: Negative.   Psychiatric/Behavioral: Positive for dysphoric mood.       Reports paranoia    Blood pressure 99/79, pulse (!) 104, temperature 98.4 F (36.9 C), temperature source Oral, resp. rate 16, height 5\' 6"  (1.676 m), weight 104.3 kg, SpO2 99 %.Body mass index is 37.12 kg/m.  General Appearance: Disheveled and Guarded  Eye Contact:  Poor  Speech:  Slow  Volume:  Decreased  Mood:  Dysphoric  Affect:  Depressed and Tearful  Thought Process:  Coherent and Descriptions of Associations: Intact  Orientation:  Full (Time, Place, and Person)  Thought Content:  WDL and Paranoid Ideation  Suicidal Thoughts:  No  Homicidal Thoughts:  No  Memory:  Immediate;   Fair Recent;   Fair Remote;   Fair  Judgement:  Fair  Insight:  Fair  Psychomotor Activity:  Decreased  Concentration:  Concentration: Fair  Recall:  [employer] of Knowledge:  Fair  Language:  Fair  Akathisia:  No  Handed:  Right  AIMS (if indicated):     Assets:  Desire for Improvement Financial Resources/Insurance Housing  ADL's:  Intact  Cognition:  WNL  Sleep:  Number of Hours: 4.25   Assessment: Patient presents in her room lying in the bed and is slow to get up.  Patient speaks with her head held down not making no eye contact and a very low voice and she is delayed in responses.  Patient is not  reporting having any issues with the medications however do not feel the Abilify is targeting her paranoia enough.  After consultation with Dr. Mallie Darting will start the patient on Seroquel 100 mg nightly and 25 mg twice daily.  We will continue the Zoloft at the current dose.  Patient does seem to be isolating and staying in her room but hopefully with some improved sleep and decreased paranoia her symptoms will start improving.  Treatment Plan Summary: Daily contact with patient to assess and evaluate symptoms and progress in treatment and Medication management Discontinue Abilify Continue Zoloft 100 mg p.o. daily for depression and anxiety Continue trazodone 50 mg p.o. nightly as needed for insomnia Start Seroquel 100 mg p.o. nightly and 25 mg p.o. twice daily for paranoia as well as depression Continue Synthroid 175 mcg p.o. daily for hypothyroidism Encourage group therapy participation Continue every 15 minute safety checks  Lewis Shock, FNP 03/17/2020, 11:05 AM

## 2020-03-18 LAB — GLUCOSE, CAPILLARY
Glucose-Capillary: 110 mg/dL — ABNORMAL HIGH (ref 70–99)
Glucose-Capillary: 120 mg/dL — ABNORMAL HIGH (ref 70–99)
Glucose-Capillary: 113 mg/dL — ABNORMAL HIGH (ref 70–99)
Glucose-Capillary: 156 mg/dL — ABNORMAL HIGH (ref 70–99)

## 2020-03-18 MED ORDER — LOPERAMIDE HCL 2 MG PO CAPS
2.0000 mg | ORAL_CAPSULE | ORAL | Status: DC | PRN
  Administered 2020-03-18 (×2): 2 mg via ORAL
  Filled 2020-03-18 (×2): qty 1

## 2020-03-18 MED ORDER — QUETIAPINE FUMARATE 200 MG PO TABS
200.0000 mg | ORAL_TABLET | Freq: Every day | ORAL | Status: DC
  Administered 2020-03-18 – 2020-03-21 (×4): 200 mg via ORAL
  Filled 2020-03-18 (×5): qty 1

## 2020-03-18 NOTE — Progress Notes (Signed)
   03/18/20 2000  Psych Admission Type (Psych Patients Only)  Admission Status Voluntary  Psychosocial Assessment  Patient Complaints Anxiety  Eye Contact Fair  Facial Expression Flat  Affect Appropriate to circumstance  Speech Logical/coherent  Interaction Assertive  Motor Activity Slow  Appearance/Hygiene Unremarkable  Behavior Characteristics Anxious  Mood Depressed  Thought Process  Coherency WDL  Content WDL  Delusions WDL;Other (Comment) (Patient reports some paranoia at times but not noted during interactions)  Perception WDL  Hallucination None reported or observed  Judgment WDL  Confusion WDL  Danger to Self  Current suicidal ideation? Denies  Self-Injurious Behavior No self-injurious ideation or behavior indicators observed or expressed   Agreement Not to Harm Self Yes  Description of Agreement contracts for safety verbal  Danger to Others  Danger to Others None reported or observed

## 2020-03-18 NOTE — Progress Notes (Signed)
D:  Patient denied SI and HI, contracts for safety.  Denied A/V hallucinations.  Denied pain. A:  Medications administered per MD orders.  Emotional support and encouragement given patient. R:  Safety maintained with 15 minute checks.  

## 2020-03-18 NOTE — Plan of Care (Signed)
Nurse discussed anxiety, depression and coping skills with patient.  

## 2020-03-18 NOTE — Progress Notes (Signed)
  D: Patient was still having trouble sleeping after seroquel was given tonight. Trazodone given prn. MHT reported that the patient has been easily startled tonight and appears paranoid at times when doing q 15 minute checks. For example,  If the bathroom is flushed in another room the patient has gotten up to look out the door. A: Staff will continue to monitor on q 15 minute checks and redirect/orient as necessary. R: Remains in the bed at present.

## 2020-03-18 NOTE — Progress Notes (Signed)
[facility] MD Progress Note  03/18/2020 9:48 AM Tanya Nolan  MRN:  [id number]   Subjective: Follow-up for this 44 year old female diagnosed with MDD severe recurrent.  Patient reports that she is feeling some better today.  She states that she did sleep better last night but was still having the noises that were keeping her awake at times.  She states that the noise that she hears is like hissing of snakes.  She states that only affects her at bedtime and when she goes to sleep but denies ever having any type of interactions with snakes and denies any nightmares about snakes.  She reports that she still feels depressed but denies any suicidal or homicidal ideations and denies any hallucinations.  She reports that she has been eating better and that the paranoia that she has been reporting is still there but not as severe as it was when she presented and feels that the Seroquel is helping her.  She denies any medication side effects at this time.  Principal Problem: MDD (major depressive disorder), recurrent severe, without psychosis ([place]) Diagnosis: Principal Problem:   MDD (major depressive disorder), recurrent severe, without psychosis ([place])  Total Time spent with patient: 30 minutes  Past Psychiatric History: History of anxiety and depression that have worsened since the death of her husband in 11-24-2019 and sister in January 2021. She reports history of one suicide attempt via cutting her wrist 1-2 weeks after her husband passed away. Denies prior hospitalizations  Past Medical History:  Past Medical History:  Diagnosis Date  . Anxiety   . BACK STRAIN, LUMBAR 03/26/2009   Qualifier: Diagnosis of  By: Jerline Pain MD, Anderson Malta    . Chalazion 10/01/2007   Qualifier: Diagnosis of  By: Genene Churn MD, Janett Billow    . Chronic back pain    tx with ibuprofen  . Diabetes mellitus   . Hypercholesteremia   . Hyperlipidemia    no meds - tx with diet  . Hypertension   . Hypothyroidism   . Ingrown toenail  12/25/2014  . 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   C&B WNL  NEG PAPS after  . Nipple discharge 10/13/2013  . Non compliance w medication regimen 10/13/2013  . 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 Thyroid dysfunction     Past Surgical History:  Procedure Laterality Date  . [place] DECOMPRESSION  2008  . HYSTEROSCOPY X 2  2007 / 2012   ENDOMETRIAL POLYPS REMOVED   Family History:  Family History  Problem Relation Age of Onset  . Hypertension Sister   . Lupus Mother   . Hypertension Mother   . Heart disease Father   . Gout Father   . Lupus Father    Family Psychiatric  History: None reported Social History:  Social History   Substance and Sexual Activity  Alcohol Use Yes  . Alcohol/week: 0.0 standard drinks   Comment: socially - wine/liquor--Rare     Social History   Substance and Sexual Activity  Drug Use No   Comment: past use "years ago" per patient    Social History   Socioeconomic History  . Marital status: Married    Spouse name: Not on file  . Number of children: Not on file  . Years of education: Not on file  . Highest education level: Not on file  Occupational History  . Not on file  Tobacco Use  . Smoking status: Current Every Day Smoker    Packs/day: 0.50  Types: Cigarettes  . Smokeless tobacco: Never Used  Substance and Sexual Activity  . Alcohol use: Yes    Alcohol/week: 0.0 standard drinks    Comment: socially - wine/liquor--Rare  . Drug use: No    Comment: past use "years ago" per patient  . Sexual activity: Yes    Birth control/protection: None    Comment: 1st intercourse 44 yo-More than 5 partners  Other Topics Concern  . Not on file  Social History Narrative  . Not on file   Social Determinants of Health   Financial Resource Strain:   . Difficulty of Paying Living Expenses:   Food Insecurity:   . Worried About Charity fundraiser in the Last Year:   . Arboriculturist in the  Last Year:   Transportation Needs:   . Film/video editor (Medical):   Marland Kitchen Lack of Transportation (Non-Medical):   Physical Activity:   . Days of Exercise per Week:   . Minutes of Exercise per Session:   Stress:   . Feeling of Stress :   Social Connections:   . Frequency of Communication with Friends and Family:   . Frequency of Social Gatherings with Friends and Family:   . Attends Religious Services:   . Active Member of Clubs or Organizations:   . Attends Archivist Meetings:   Marland Kitchen Marital Status:    Additional Social History:                         Sleep: Fair  Appetite:  Good  Current Medications: Current Facility-Administered Medications  Medication Dose Route Frequency Provider Last Rate Last Admin  . acetaminophen (TYLENOL) tablet 650 mg  650 mg Oral Q4H PRN Rankin, Shuvon B, NP      . alum & mag hydroxide-simeth (MAALOX/MYLANTA) 200-200-20 MG/5ML suspension 30 mL  30 mL Oral Q6H PRN Rankin, Shuvon B, NP      . atorvastatin (LIPITOR) tablet 80 mg  80 mg Oral Daily Eugenie Filler, MD   80 mg at 03/18/20 0738  . enalapril (VASOTEC) tablet 10 mg  10 mg Oral Daily Cobos, Myer Peer, MD   10 mg at 03/18/20 0738  . insulin aspart (novoLOG) injection 0-15 Units  0-15 Units Subcutaneous TID WC Eugenie Filler, MD   2 Units at 03/17/20 1213  . insulin aspart (novoLOG) injection 5 Units  5 Units Subcutaneous TID WC Cobos, Myer Peer, MD   5 Units at 03/18/20 [phone]  . insulin glargine (LANTUS) injection 15 Units  15 Units Subcutaneous [id number] Cobos, Myer Peer, MD   15 Units at 03/18/20 0741  . levothyroxine (SYNTHROID) tablet 125 mcg  125 mcg Oral Q0600 Cobos, Myer Peer, MD   125 mcg at 03/18/20 0618  . loratadine (CLARITIN) tablet 10 mg  10 mg Oral Daily Eugenie Filler, MD   10 mg at 03/18/20 [id number]  . ondansetron (ZOFRAN) tablet 4 mg  4 mg Oral Q8H PRN Rankin, Shuvon B, NP      . polyethylene glycol (MIRALAX / GLYCOLAX) packet 17 g  17 g Oral Daily  PRN Cobos, Fernando A, MD      . QUEtiapine (SEROQUEL) tablet 200 mg  200 mg Oral QHS Tim Corriher, Lowry Ram, FNP      . QUEtiapine (SEROQUEL) tablet 25 mg  25 mg Oral BID Beola Vasallo, Lowry Ram, FNP   25 mg at 03/18/20 0739  . sertraline (ZOLOFT) tablet 100 mg  100 mg  Oral Daily Suella Broad, FNP   100 mg at 03/18/20 0739  . traZODone (DESYREL) tablet 50 mg  50 mg Oral QHS PRN Jakeisha Stricker, Lowry Ram, FNP   50 mg at 03/18/20 0006    Lab Results:  Results for orders placed or performed during the hospital encounter of 03/15/20 (from the past 48 hour(s))  Glucose, capillary     Status: Abnormal   Collection Time: 03/16/20 11:15 AM  Result Value Ref Range   Glucose-Capillary 104 (H) 70 - 99 mg/dL    Comment: Glucose reference range applies only to samples taken after fasting for at least 8 hours.  Glucose, capillary     Status: Abnormal   Collection Time: 03/16/20  4:20 PM  Result Value Ref Range   Glucose-Capillary 168 (H) 70 - 99 mg/dL    Comment: Glucose reference range applies only to samples taken after fasting for at least 8 hours.  Glucose, capillary     Status: Abnormal   Collection Time: 03/16/20  7:58 PM  Result Value Ref Range   Glucose-Capillary 123 (H) 70 - 99 mg/dL    Comment: Glucose reference range applies only to samples taken after fasting for at least 8 hours.   Comment 1 Notify RN    Comment 2 Document in Chart   Glucose, capillary     Status: Abnormal   Collection Time: 03/17/20  5:56 AM  Result Value Ref Range   Glucose-Capillary 107 (H) 70 - 99 mg/dL    Comment: Glucose reference range applies only to samples taken after fasting for at least 8 hours.   Comment 1 Notify RN    Comment 2 Document in Chart   Glucose, capillary     Status: Abnormal   Collection Time: 03/17/20 11:50 AM  Result Value Ref Range   Glucose-Capillary 123 (H) 70 - 99 mg/dL    Comment: Glucose reference range applies only to samples taken after fasting for at least 8 hours.  Glucose, capillary      Status: Abnormal   Collection Time: 03/17/20  4:46 PM  Result Value Ref Range   Glucose-Capillary 140 (H) 70 - 99 mg/dL    Comment: Glucose reference range applies only to samples taken after fasting for at least 8 hours.  Glucose, capillary     Status: Abnormal   Collection Time: 03/17/20  8:39 PM  Result Value Ref Range   Glucose-Capillary 136 (H) 70 - 99 mg/dL    Comment: Glucose reference range applies only to samples taken after fasting for at least 8 hours.   Comment 1 Notify RN    Comment 2 Document in Chart   Glucose, capillary     Status: Abnormal   Collection Time: 03/18/20  5:49 AM  Result Value Ref Range   Glucose-Capillary 110 (H) 70 - 99 mg/dL    Comment: Glucose reference range applies only to samples taken after fasting for at least 8 hours.   Comment 1 Notify RN    Comment 2 Document in Chart     Blood Alcohol level:  Lab Results  Component Value Date   ETH <10 [id number]    Metabolic Disorder Labs: Lab Results  Component Value Date   HGBA1C 7.6 (H) 03/16/2020   HGBA1C 7.6 (H) 03/16/2020   MPG 171.42 03/16/2020   MPG 171.42 03/16/2020   No results found for: PROLACTIN Lab Results  Component Value Date   CHOL 211 (H) 03/16/2020   TRIG 103 03/16/2020   HDL 28 (L)  03/16/2020   CHOLHDL 7.5 03/16/2020   VLDL 21 03/16/2020   LDLCALC 162 (H) 03/16/2020   LDLCALC 175 (H) 06/20/2019    Physical Findings: AIMS: Facial and Oral Movements Muscles of Facial Expression: None, normal Lips and Perioral Area: None, normal Jaw: None, normal Tongue: None, normal,Extremity Movements Upper (arms, wrists, hands, fingers): None, normal Lower (legs, knees, ankles, toes): None, normal, Trunk Movements Neck, shoulders, hips: None, normal, Overall Severity Severity of abnormal movements (highest score from questions above): None, normal Incapacitation due to abnormal movements: None, normal Patient's awareness of abnormal movements (rate only patient's report): No  Awareness, Dental Status Current problems with teeth and/or dentures?: No Does patient usually wear dentures?: No  CIWA:    COWS:     Musculoskeletal: Strength & Muscle Tone: within normal limits Gait & Station: normal Patient leans: N/A  Psychiatric Specialty Exam: Physical Exam  Nursing note and vitals reviewed. Constitutional: She is oriented to person, place, and time. She appears well-developed and well-nourished.  Cardiovascular: Normal rate.  Respiratory: Effort normal.  Musculoskeletal:        General: Normal range of motion.  Neurological: She is alert and oriented to person, place, and time.  Skin: Skin is warm.  Psychiatric: Her mood appears anxious. Thought content is paranoid. She exhibits a depressed mood.    Review of Systems  Constitutional: Negative.   HENT: Negative.   Eyes: Negative.   Respiratory: Negative.   Cardiovascular: Negative.   Gastrointestinal: Negative.   Genitourinary: Negative.   Musculoskeletal: Negative.   Skin: Negative.   Neurological: Negative.   Psychiatric/Behavioral: Positive for dysphoric mood. The patient is nervous/anxious.     Blood pressure 115/68, pulse 88, temperature 98.4 F (36.9 C), temperature source Oral, resp. rate 16, height 5\' 6"  (1.676 m), weight 104.3 kg, SpO2 99 %.Body mass index is 37.12 kg/m.  General Appearance: Disheveled  Eye Contact:  Fair  Speech:  Clear and Coherent and Slow  Volume:  Decreased  Mood:  Anxious and Depressed  Affect:  Congruent  Thought Process:  Coherent and Descriptions of Associations: Circumstantial  Orientation:  Full (Time, Place, and Person)  Thought Content:  WDL and Paranoid Ideation  Suicidal Thoughts:  No  Homicidal Thoughts:  No  Memory:  Immediate;   Fair Recent;   Fair Remote;   Fair  Judgement:  Fair  Insight:  Fair  Psychomotor Activity:  Normal  Concentration:  Concentration: Fair  Recall:  [employer] of Knowledge:  Fair  Language:  Fair  Akathisia:  No   Handed:  Right  AIMS (if indicated):     Assets:  Desire for Improvement Financial Resources/Insurance Housing Resilience Social Support  ADL's:  Intact  Cognition:  WNL  Sleep:  Number of Hours: 1.25   Assessment: Patient presents in her room lying on the bed but is awake and is drawing on a piece of paper.  Patient is pleasant, calm, cooperative.  Patient's affect is improving is congruent today and she smiles on a couple of things but is still appears to be depressed.  Glad to hear that her paranoia is improving as well as her sleep but she does continue to report some paranoia.  After consultation with Dr. Mallie Darting will increase her Seroquel to 200 mg p.o. nightly.  Discussed the possibility of starting prazosin with concerns of blood pressure or the possibility of starting Topamax since her symptoms seem to be when she goes to sleep.  It was decided to only increase the  Seroquel at this time and will potentially use Topamax or prazosin at a later date if still needed.  Patient blood pressure has remained stable at 115/68 and her heart rate has maintained 88. FSBS have remained WNL s at 140, 136, and 110.  Treatment Plan Summary: Daily contact with patient to assess and evaluate symptoms and progress in treatment and Medication management Continue Lipitor 80 mg p.o. daily for hyperlipidemia Continue Vasotec 10 mg p.o. daily for hypertension Continue NovoLog sliding scale for diabetes Continue NovoLog meal coverage 5 units subcu 3 times daily with meals Continue Lantus 15 units subcu twice daily for diabetes Continue Synthroid 125 mcg p.o. daily for hypothyroidism Increase Seroquel 200 mg p.o. nightly for MDD Continue Seroquel 25 mg p.o. twice daily for mood stability Continue Zoloft 100 mg p.o. daily for anxiety and depression Continue trazodone 50 mg p.o. nightly as needed for insomnia Encourage group therapy participation Continue every 15 minute safety checks  Lewis Shock,  FNP 03/18/2020, 9:48 AM

## 2020-03-19 LAB — GLUCOSE, CAPILLARY
Glucose-Capillary: 120 mg/dL — ABNORMAL HIGH (ref 70–99)
Glucose-Capillary: 120 mg/dL — ABNORMAL HIGH (ref 70–99)
Glucose-Capillary: 121 mg/dL — ABNORMAL HIGH (ref 70–99)
Glucose-Capillary: 143 mg/dL — ABNORMAL HIGH (ref 70–99)

## 2020-03-19 MED ORDER — MECLIZINE HCL 12.5 MG PO TABS
12.5000 mg | ORAL_TABLET | Freq: Three times a day (TID) | ORAL | Status: DC | PRN
  Administered 2020-03-19: 11:00:00 12.5 mg via ORAL
  Filled 2020-03-19: qty 1

## 2020-03-19 MED ORDER — ENALAPRIL MALEATE 2.5 MG PO TABS
2.5000 mg | ORAL_TABLET | Freq: Every day | ORAL | Status: DC
  Administered 2020-03-20 – 2020-03-21 (×2): 2.5 mg via ORAL
  Filled 2020-03-19 (×4): qty 1

## 2020-03-19 NOTE — [facility] Suicide Risk Assessment (Signed)
[place] INPATIENT:  Family/Significant Other Suicide Prevention Education  SPE completed with patient, as attempts to contact the patient's collateral contacts were unsuccessful. SPI pamphlet provided to pt and pt was encouraged to share information with support network, ask questions, and talk about any concerns relating to SPE. Patient denies access to guns/firearms and verbalized understanding of information provided. Mobile Crisis information also provided to patient.   Radonna Ricker, MSW, LCSW Clinical Social Worker [facility]  Phone: Or[place] 03/19/2020, 11:40 AM

## 2020-03-19 NOTE — [facility] Group Notes (Signed)
LCSW Group Therapy Note 03/19/2020 1:41 PM  Type of Therapy/Topic: Group Therapy: Emotion Regulation  Participation Level: Did Not Attend   Description of Group:  The purpose of this group is to assist patients in learning to regulate negative emotions and experience positive emotions. Patients will be guided to discuss ways in which they have been vulnerable to their negative emotions. These vulnerabilities will be juxtaposed with experiences of positive emotions or situations, and patients will be challenged to use positive emotions to combat negative ones. Special emphasis will be placed on coping with negative emotions in conflict situations, and patients will process healthy conflict resolution skills.  Therapeutic Goals: 1. Patient will identify two positive emotions or experiences to reflect on in order to balance out negative emotions 2. Patient will label two or more emotions that they find the most difficult to experience 3. Patient will demonstrate positive conflict resolution skills through discussion and/or role plays  Summary of Patient Progress:  Invited, chose not to attend.    Therapeutic Modalities:  Cognitive Behavioral Therapy Feelings Identification Dialectical Behavioral Therapy   Theresa Duty Clinical Social Worker

## 2020-03-19 NOTE — Progress Notes (Signed)
[facility] MD Progress Note  03/19/2020 10:40 AM Tanya Nolan  MRN:  [id number]   Subjective: Follow-up for this 44 year old female diagnosed with MDD recurrent severe.  Patient reports that today she is having some issues with dizziness.  She is not sure if it is due to the medications or if it is her blood pressure.  She states that today she just does not feel good physically, but her mood is improving.  She states that she feels things have been going very well and she feels that the medications are definitely helping.  She states that she slept better last night and her paranoia has continued to decrease.  She reports that she has been getting up and eating and drinking better than she was when she came in but is just concerned about feeling dizzy.  She denies any suicidal or homicidal ideations and denies any hallucinations.  Principal Problem: MDD (major depressive disorder), recurrent severe, without psychosis ([place]) Diagnosis: Principal Problem:   MDD (major depressive disorder), recurrent severe, without psychosis ([place])  Total Time spent with patient: 30 minutes  Past Psychiatric History: History of anxiety and depression that have worsened since the death of her husband in 2019/12/04 and sister in January 2021. She reports history of one suicide attempt via cutting her wrist 1-2 weeks after her husband passed away. Denies prior hospitalizations  Past Medical History:  Past Medical History:  Diagnosis Date  . Anxiety   . BACK STRAIN, LUMBAR 03/26/2009   Qualifier: Diagnosis of  By: Jerline Pain MD, Anderson Malta    . Chalazion 10/01/2007   Qualifier: Diagnosis of  By: Genene Churn MD, Janett Billow    . Chronic back pain    tx with ibuprofen  . Diabetes mellitus   . Hypercholesteremia   . Hyperlipidemia    no meds - tx with diet  . Hypertension   . Hypothyroidism   . Ingrown toenail 12/25/2014  . 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   C&B WNL  NEG PAPS after  . Nipple discharge  10/13/2013  . Non compliance w medication regimen 10/13/2013  . 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 Thyroid dysfunction     Past Surgical History:  Procedure Laterality Date  . [place] DECOMPRESSION  2008  . HYSTEROSCOPY X 2  2007 / 2012   ENDOMETRIAL POLYPS REMOVED   Family History:  Family History  Problem Relation Age of Onset  . Hypertension Sister   . Lupus Mother   . Hypertension Mother   . Heart disease Father   . Gout Father   . Lupus Father    Family Psychiatric  History: None reported Social History:  Social History   Substance and Sexual Activity  Alcohol Use Yes  . Alcohol/week: 0.0 standard drinks   Comment: socially - wine/liquor--Rare     Social History   Substance and Sexual Activity  Drug Use No   Comment: past use "years ago" per patient    Social History   Socioeconomic History  . Marital status: Married    Spouse name: Not on file  . Number of children: Not on file  . Years of education: Not on file  . Highest education level: Not on file  Occupational History  . Not on file  Tobacco Use  . Smoking status: Current Every Day Smoker    Packs/day: 0.50    Types: Cigarettes  . Smokeless tobacco: Never Used  Substance and Sexual Activity  . Alcohol use: Yes  Alcohol/week: 0.0 standard drinks    Comment: socially - wine/liquor--Rare  . Drug use: No    Comment: past use "years ago" per patient  . Sexual activity: Yes    Birth control/protection: None    Comment: 1st intercourse 44 yo-More than 5 partners  Other Topics Concern  . Not on file  Social History Narrative  . Not on file   Social Determinants of Health   Financial Resource Strain:   . Difficulty of Paying Living Expenses:   Food Insecurity:   . Worried About Charity fundraiser in the Last Year:   . Arboriculturist in the Last Year:   Transportation Needs:   . Film/video editor (Medical):   Marland Kitchen Lack of Transportation (Non-Medical):    Physical Activity:   . Days of Exercise per Week:   . Minutes of Exercise per Session:   Stress:   . Feeling of Stress :   Social Connections:   . Frequency of Communication with Friends and Family:   . Frequency of Social Gatherings with Friends and Family:   . Attends Religious Services:   . Active Member of Clubs or Organizations:   . Attends Archivist Meetings:   Marland Kitchen Marital Status:    Additional Social History:                         Sleep: Good  Appetite:  Good  Current Medications: Current Facility-Administered Medications  Medication Dose Route Frequency Provider Last Rate Last Admin  . acetaminophen (TYLENOL) tablet 650 mg  650 mg Oral Q4H PRN Rankin, Shuvon B, NP      . alum & mag hydroxide-simeth (MAALOX/MYLANTA) 200-200-20 MG/5ML suspension 30 mL  30 mL Oral Q6H PRN Rankin, Shuvon B, NP      . atorvastatin (LIPITOR) tablet 80 mg  80 mg Oral Daily Eugenie Filler, MD   80 mg at 03/19/20 0916  . [START ON 03/20/2020] enalapril (VASOTEC) tablet 2.5 mg  2.5 mg Oral Daily Sharma Covert, MD      . insulin aspart (novoLOG) injection 0-15 Units  0-15 Units Subcutaneous TID WC Eugenie Filler, MD   2 Units at 03/17/20 1213  . insulin aspart (novoLOG) injection 5 Units  5 Units Subcutaneous TID WC Cobos, Myer Peer, MD   5 Units at 03/18/20 1701  . insulin glargine (LANTUS) injection 15 Units  15 Units Subcutaneous [id number] Cobos, Myer Peer, MD   15 Units at 03/19/20 0919  . levothyroxine (SYNTHROID) tablet 125 mcg  125 mcg Oral Q0600 Cobos, Myer Peer, MD   125 mcg at 03/19/20 0915  . loperamide (IMODIUM) capsule 2 mg  2 mg Oral PRN Lindell Spar I, NP   2 mg at 03/18/20 2137  . loratadine (CLARITIN) tablet 10 mg  10 mg Oral Daily Eugenie Filler, MD   10 mg at 03/19/20 0916  . meclizine (ANTIVERT) tablet 12.5 mg  12.5 mg Oral TID PRN Sehaj Mcenroe, Lowry Ram, FNP      . ondansetron (ZOFRAN) tablet 4 mg  4 mg Oral Q8H PRN Rankin, Shuvon B, NP      .  polyethylene glycol (MIRALAX / GLYCOLAX) packet 17 g  17 g Oral Daily PRN Cobos, Fernando A, MD      . QUEtiapine (SEROQUEL) tablet 200 mg  200 mg Oral QHS Stanley Helmuth B, FNP   200 mg at 03/18/20 2137  . QUEtiapine (SEROQUEL) tablet 25 mg  25 mg Oral BID Akshar Starnes, Lowry Ram, FNP   25 mg at 03/19/20 0916  . sertraline (ZOLOFT) tablet 100 mg  100 mg Oral Daily Suella Broad, FNP   100 mg at 03/19/20 0916  . traZODone (DESYREL) tablet 50 mg  50 mg Oral QHS PRN Damyia Strider, Lowry Ram, FNP   50 mg at 03/18/20 2137    Lab Results:  Results for orders placed or performed during the hospital encounter of 03/15/20 (from the past 48 hour(s))  Glucose, capillary     Status: Abnormal   Collection Time: 03/17/20 11:50 AM  Result Value Ref Range   Glucose-Capillary 123 (H) 70 - 99 mg/dL    Comment: Glucose reference range applies only to samples taken after fasting for at least 8 hours.  Glucose, capillary     Status: Abnormal   Collection Time: 03/17/20  4:46 PM  Result Value Ref Range   Glucose-Capillary 140 (H) 70 - 99 mg/dL    Comment: Glucose reference range applies only to samples taken after fasting for at least 8 hours.  Glucose, capillary     Status: Abnormal   Collection Time: 03/17/20  8:39 PM  Result Value Ref Range   Glucose-Capillary 136 (H) 70 - 99 mg/dL    Comment: Glucose reference range applies only to samples taken after fasting for at least 8 hours.   Comment 1 Notify RN    Comment 2 Document in Chart   Glucose, capillary     Status: Abnormal   Collection Time: 03/18/20  5:49 AM  Result Value Ref Range   Glucose-Capillary 110 (H) 70 - 99 mg/dL    Comment: Glucose reference range applies only to samples taken after fasting for at least 8 hours.   Comment 1 Notify RN    Comment 2 Document in Chart   Glucose, capillary     Status: Abnormal   Collection Time: 03/18/20 11:59 AM  Result Value Ref Range   Glucose-Capillary 120 (H) 70 - 99 mg/dL    Comment: Glucose reference range  applies only to samples taken after fasting for at least 8 hours.  Glucose, capillary     Status: Abnormal   Collection Time: 03/18/20  4:41 PM  Result Value Ref Range   Glucose-Capillary 113 (H) 70 - 99 mg/dL    Comment: Glucose reference range applies only to samples taken after fasting for at least 8 hours.  Glucose, capillary     Status: Abnormal   Collection Time: 03/18/20  8:22 PM  Result Value Ref Range   Glucose-Capillary 156 (H) 70 - 99 mg/dL    Comment: Glucose reference range applies only to samples taken after fasting for at least 8 hours.  Glucose, capillary     Status: Abnormal   Collection Time: 03/19/20  5:53 AM  Result Value Ref Range   Glucose-Capillary 120 (H) 70 - 99 mg/dL    Comment: Glucose reference range applies only to samples taken after fasting for at least 8 hours.    Blood Alcohol level:  Lab Results  Component Value Date   ETH <10 [id number]    Metabolic Disorder Labs: Lab Results  Component Value Date   HGBA1C 7.6 (H) 03/16/2020   HGBA1C 7.6 (H) 03/16/2020   MPG 171.42 03/16/2020   MPG 171.42 03/16/2020   No results found for: PROLACTIN Lab Results  Component Value Date   CHOL 211 (H) 03/16/2020   TRIG 103 03/16/2020   HDL 28 (L) 03/16/2020   CHOLHDL 7.5  03/16/2020   VLDL 21 03/16/2020   LDLCALC 162 (H) 03/16/2020   LDLCALC 175 (H) 06/20/2019    Physical Findings: AIMS: Facial and Oral Movements Muscles of Facial Expression: None, normal Lips and Perioral Area: None, normal Jaw: None, normal Tongue: None, normal,Extremity Movements Upper (arms, wrists, hands, fingers): None, normal Lower (legs, knees, ankles, toes): None, normal, Trunk Movements Neck, shoulders, hips: None, normal, Overall Severity Severity of abnormal movements (highest score from questions above): None, normal Incapacitation due to abnormal movements: None, normal Patient's awareness of abnormal movements (rate only patient's report): No Awareness, Dental  Status Current problems with teeth and/or dentures?: No Does patient usually wear dentures?: No  CIWA:    COWS:     Musculoskeletal: Strength & Muscle Tone: within normal limits Gait & Station: unsteady due to dizziness Patient leans: N/A  Psychiatric Specialty Exam: Physical Exam  Nursing note and vitals reviewed. Constitutional: She is oriented to person, place, and time. She appears well-developed and well-nourished.  Cardiovascular: Normal rate.  Respiratory: Effort normal.  Musculoskeletal:        General: Normal range of motion.  Neurological: She is alert and oriented to person, place, and time.  Skin: Skin is warm.    Review of Systems  Constitutional: Negative.   HENT: Negative.   Eyes: Negative.   Respiratory: Negative.   Cardiovascular: Negative.   Gastrointestinal: Negative.   Genitourinary: Negative.   Musculoskeletal: Negative.   Skin: Negative.   Neurological: Positive for dizziness and light-headedness.  Psychiatric/Behavioral: Negative.     Blood pressure 140/72, pulse 68, temperature 98.2 F (36.8 C), temperature source Oral, resp. rate 16, height 5\' 6"  (1.676 m), weight 104.3 kg, SpO2 99 %.Body mass index is 37.12 kg/m.  General Appearance: Disheveled  Eye Contact:  Fair  Speech:  Clear and Coherent and Normal Rate  Volume:  Decreased  Mood:  Euthymic  Affect:  Flat  Thought Process:  Coherent and Descriptions of Associations: Intact  Orientation:  Full (Time, Place, and Person)  Thought Content:  WDL  Suicidal Thoughts:  No  Homicidal Thoughts:  No  Memory:  Immediate;   Fair Recent;   Fair Remote;   Fair  Judgement:  Fair  Insight:  Fair  Psychomotor Activity:  Normal  Concentration:  Concentration: Fair  Recall:  [employer] of Knowledge:  Fair  Language:  Fair  Akathisia:  No  Handed:  Right  AIMS (if indicated):     Assets:  Communication Skills Desire for Improvement Financial Resources/Insurance Housing Social Support  ADL's:   Intact  Cognition:  WNL  Sleep:  Number of Hours: 6.75   Assessment: Patient presents in her room lying in the bed asleep, but she is easily aroused.  Patient appears to be groggy and has been reporting feeling dizzy.  Consulted with Dr. Mallie Darting and reviewed patient's chart as well as medications.  Will start patient on Antivert 12.5 mg 3 times daily as needed for dizziness and will also decrease her enalapril to 2.5 mg p.o. daily.  Patient's blood pressure was rechecked since this morning.  This morning it was 112/93 and when rechecked when I did a evaluation it was 140/72 heart rate has been within normal limits the entire time.  Otherwise patient seems to be tolerating medications well and is showing improvement with her mood based on patient's report.  Patient is also been seen out of her room more and not isolating as much.  Patient blood sugars have remained stable with last  3 readings being 113, 156, and 120.  Treatment Plan Summary: Daily contact with patient to assess and evaluate symptoms and progress in treatment and Medication management Continue Lipitor 80 mg p.o. daily for hyperlipidemia Decrease Vasotec 2.5 mg p.o. daily for hypertension Continue NovoLog sliding scale for diabetes Continue NovoLog 5 units subcu 3 times daily with meals for meal coverage Continue Lantus 15 units subcu every morning for diabetes Continue Synthroid 125 mcg p.o. daily for hypothyroidism Start Antivert 12.5 mg p.o. 3 times daily as needed for dizziness Continue Seroquel 200 mg p.o. nightly and 25 mg p.o. twice daily for depressive symptoms and mood stability Continue Zoloft 100 mg p.o. daily for depression and anxiety Continue trazodone 50 mg p.o. nightly as needed for insomnia Encourage group therapy participation Continue every 15 minute safety checks Potential discharge in the next 1-2 days  Lewis Shock, FNP 03/19/2020, 10:40 AM

## 2020-03-19 NOTE — [facility] Suicide Risk Assessment (Signed)
[place] INPATIENT:  Family/Significant Other Suicide Prevention Education  Suicide Prevention Education:  Contact Attempts: with mother, [name] [phone]) has been identified by the patient as the family member/significant other with whom the patient will be residing, and identified as the person(s) who will aid the patient in the event of a mental health crisis.  With written consent from the patient, two attempts were made to provide suicide prevention education, prior to and/or following the patient's discharge.  We were unsuccessful in providing suicide prevention education.  A suicide education pamphlet was given to the patient to share with family/significant other.  Date and time of first attempt:03/17/2020 at 2:42pm via Stephanie Acre, [place]. No option to leave a message.  Date and time of second attempt:05/07/02021 at 11:37am via Radonna Ricker, LCSW. No option to leave HIPAA complaint voicemail.   Marylee Floras 03/19/2020, 11:37 AM

## 2020-03-19 NOTE — Progress Notes (Signed)
   03/19/20 2200  Psych Admission Type (Psych Patients Only)  Admission Status Voluntary  Psychosocial Assessment  Patient Complaints Anxiety  Eye Contact Avoids  Facial Expression Flat  Affect Anxious  Speech Logical/coherent  Interaction Assertive  Motor Activity Slow  Appearance/Hygiene Unremarkable  Behavior Characteristics Cooperative  Mood Anxious  Thought Process  Coherency WDL  Content WDL  Delusions WDL;Other (Comment) (Patient reports some paranoia at times but not noted during interactions)  Perception WDL  Hallucination None reported or observed  Judgment WDL  Confusion WDL  Danger to Self  Current suicidal ideation? Denies  Self-Injurious Behavior No self-injurious ideation or behavior indicators observed or expressed   Agreement Not to Harm Self Yes  Description of Agreement verbally contracts for safety   Danger to Others  Danger to Others None reported or observed

## 2020-03-19 NOTE — Progress Notes (Signed)
Pt presents as lethargic and said she feels dizzy.  Pt's vitals signs are WNL.  Pt denied SI/HI/AVH.  Pt said her mood is improving.  Pt has spent most of the day in her room in bed.  RN administered medications as prescribed.  RN assessed for needs/concerns and provided reassurance.  Safety checks maintained q 15 minutes.  RN will continue to monitor and provide support as needed.

## 2020-03-19 NOTE — Progress Notes (Addendum)
Pt Bp sitting was 112/93 , but when she stood up it dropped and pt felt very dizzy. Pt was given Gatorade and told to remain seated. Pt Seroquel was increased last night and pt was given Trazodone also which could possibly be the issue. Pt encouraged to let the doctor know. Pt did have episode of diarrhea and given Immodium

## 2020-03-19 NOTE — Progress Notes (Signed)
Recreation Therapy Notes  Date: 5.7.21 Time: 0930 Location: [address] Group Room  Group Topic: Stress Management  Goal Area(s) Addresses:  Patient will identify positive stress management techniques. Patient will identify benefits of using stress management post d/c.  Intervention: Stress Management  Activity: Meditation.  LRT played a meditation that focused on making choices.  Patients were to listen and follow along as meditation played to engage in activity.  Education:  Stress Management, Discharge Planning.   Education Outcome: Acknowledges Education  Clinical Observations/Feedback::  Pt did not attend group activity.    Victorino Sparrow, LRT/CTRS         Victorino Sparrow A 03/19/2020 11:26 AM

## 2020-03-20 LAB — GLUCOSE, CAPILLARY
Glucose-Capillary: 101 mg/dL — ABNORMAL HIGH (ref 70–99)
Glucose-Capillary: 118 mg/dL — ABNORMAL HIGH (ref 70–99)
Glucose-Capillary: 176 mg/dL — ABNORMAL HIGH (ref 70–99)
Glucose-Capillary: 121 mg/dL — ABNORMAL HIGH (ref 70–99)

## 2020-03-20 MED ORDER — QUETIAPINE FUMARATE 25 MG PO TABS: 25.0000 mg | ORAL_TABLET | Freq: Two times a day (BID) | ORAL | Status: DC | PRN

## 2020-03-20 NOTE — Progress Notes (Signed)
   03/20/20 2202  COVID-19 Daily Checkoff  Have you had a fever (temp > 37.80C/100F)  in the past 24 hours?  No  If you have had runny nose, nasal congestion, sneezing in the past 24 hours, has it worsened? No  COVID-19 EXPOSURE  Have you traveled outside the state in the past 14 days? No  Have you been in contact with someone with a confirmed diagnosis of COVID-19 or PUI in the past 14 days without wearing appropriate PPE? No  Have you been living in the same home as a person with confirmed diagnosis of COVID-19 or a PUI (household contact)? No  Have you been diagnosed with COVID-19? No

## 2020-03-20 NOTE — Progress Notes (Signed)
   03/20/20 2204  Psych Admission Type (Psych Patients Only)  Admission Status Voluntary  Psychosocial Assessment  Patient Complaints Anxiety  Eye Contact Fair  Facial Expression Flat  Affect Appropriate to circumstance  Speech Logical/coherent  Interaction Assertive  Motor Activity Other (Comment) (WDL)  Behavior Characteristics Appropriate to situation  Mood Anxious;Pleasant  Thought Process  Coherency Concrete thinking  Content WDL  Delusions None reported or observed  Perception Hallucinations  Hallucination None reported or observed  Judgment Limited  Confusion None  Danger to Self  Current suicidal ideation? Denies  Self-Injurious Behavior No self-injurious ideation or behavior indicators observed or expressed   Agreement Not to Harm Self Yes  Description of Agreement verbally contracts for safety   Danger to Others  Danger to Others None reported or observed

## 2020-03-20 NOTE — Progress Notes (Signed)
Adult Psychoeducational Group Note  Date:  03/20/2020 Time:  10:22 PM  Group Topic/Focus:  Wrap Up  Participation Level:  Active  Participation Quality:  Appropriate  Affect:  Anxious and Appropriate  Cognitive:  Appropriate  Insight: Good  Engagement in Group:  Engaged  Modes of Intervention:  Discussion, Education and Support  Additional Comments: Pt attended group. She was engaged and showed some anxiousness. During group we went around the room for introductions, talked about the highs and lows of the day. We discussed different types of coping skills, self esteem and natural supports. Pt provided the group with one of her positive ways of coping which is to take a drive. Kyl Givler, [place] 03/20/2020, 10:22 PM

## 2020-03-20 NOTE — Progress Notes (Signed)
[facility] MD Progress Note  03/20/2020 1:35 PM Tanya Nolan  MRN:  [id number]   Subjective: Tanya Nolan reports, "I think I'm getting better. The medicines are making me sleepy".   Objective: 44 year old AA female, widowed (husband died in 21-Nov-2019), no children, lives with her mother. Employed part time. Presented to ED via EMS whom she had called . States she has been feeling depressed, sad. States her depression is chronic but has worsened over recent months due to death of loved ones. Her husband died in 11/21/2019 and her sister passed away in 2019/12/22 from [place].  Denease is seen for follow-up. She was lying down in her bed asleep but easily aroused. Presents sluggish. Says she is feeling too sleepy. Blamed it on the effects of her medications. Patient reported some issues with dizziness yesterday, says she is feeling a lot better from the dizziness today.  Denies any problems with diarrhea today. She feels her mood is improving & that she is getting better. She states that she feels things have been going very well and she feels that the medications are definitely helping.  She denies any paranoid ideations today. Although says does not feel like going to group, with encouragement, did attend group sessions held today.  She denies any suicidal or homicidal ideations. Denies any hallucinations any AVH, delusional thoughts or paranoia. Tanya Nolan does not appear to be responding to any internal stimuli. Her daytime Seroquel dosing has been changed to PRN to aid decrease daytime sedation. She is in agreement to continue her plan of care as already in progress.  Principal Problem: MDD (major depressive disorder), recurrent severe, without psychosis ([place])  Diagnosis: Principal Problem:   MDD (major depressive disorder), recurrent severe, without psychosis ([place])  Total Time spent with patient: 30 minutes  Past Psychiatric History: Anxiety and depression that have worsened since the death of her husband  in 11/21/19 and sister in 22-Dec-2019. She reports history of one suicide attempt via cutting her wrist 1-2 weeks after her husband passed away. Denies prior hospitalizations  Past Medical History:  Past Medical History:  Diagnosis Date  . Anxiety   . BACK STRAIN, LUMBAR 03/26/2009   Qualifier: Diagnosis of  By: Jerline Pain MD, Anderson Malta    . Chalazion 10/01/2007   Qualifier: Diagnosis of  By: Genene Churn MD, Janett Billow    . Chronic back pain    tx with ibuprofen  . Diabetes mellitus   . Hypercholesteremia   . Hyperlipidemia    no meds - tx with diet  . Hypertension   . Hypothyroidism   . Ingrown toenail 12/25/2014  . 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   C&B WNL  NEG PAPS after  . Nipple discharge 10/13/2013  . Non compliance w medication regimen 10/13/2013  . 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 Thyroid dysfunction     Past Surgical History:  Procedure Laterality Date  . [place] DECOMPRESSION  2008  . HYSTEROSCOPY X 2  2007 / 2012   ENDOMETRIAL POLYPS REMOVED   Family History:  Family History  Problem Relation Age of Onset  . Hypertension Sister   . Lupus Mother   . Hypertension Mother   . Heart disease Father   . Gout Father   . Lupus Father    Family Psychiatric  History: None reported  Social History:  Social History   Substance and Sexual Activity  Alcohol Use Yes  . Alcohol/week: 0.0 standard drinks  Comment: socially - wine/liquor--Rare     Social History   Substance and Sexual Activity  Drug Use No   Comment: past use "years ago" per patient    Social History   Socioeconomic History  . Marital status: Married    Spouse name: Not on file  . Number of children: Not on file  . Years of education: Not on file  . Highest education level: Not on file  Occupational History  . Not on file  Tobacco Use  . Smoking status: Current Every Day Smoker    Packs/day: 0.50    Types: Cigarettes  . Smokeless  tobacco: Never Used  Substance and Sexual Activity  . Alcohol use: Yes    Alcohol/week: 0.0 standard drinks    Comment: socially - wine/liquor--Rare  . Drug use: No    Comment: past use "years ago" per patient  . Sexual activity: Yes    Birth control/protection: None    Comment: 1st intercourse 44 yo-More than 5 partners  Other Topics Concern  . Not on file  Social History Narrative  . Not on file   Social Determinants of Health   Financial Resource Strain:   . Difficulty of Paying Living Expenses:   Food Insecurity:   . Worried About Charity fundraiser in the Last Year:   . Arboriculturist in the Last Year:   Transportation Needs:   . Film/video editor (Medical):   Marland Kitchen Lack of Transportation (Non-Medical):   Physical Activity:   . Days of Exercise per Week:   . Minutes of Exercise per Session:   Stress:   . Feeling of Stress :   Social Connections:   . Frequency of Communication with Friends and Family:   . Frequency of Social Gatherings with Friends and Family:   . Attends Religious Services:   . Active Member of Clubs or Organizations:   . Attends Archivist Meetings:   Marland Kitchen Marital Status:    Additional Social History:   Sleep: Good  Appetite:  Good  Current Medications: Current Facility-Administered Medications  Medication Dose Route Frequency Provider Last Rate Last Admin  . acetaminophen (TYLENOL) tablet 650 mg  650 mg Oral Q4H PRN Rankin, Shuvon B, NP      . alum & mag hydroxide-simeth (MAALOX/MYLANTA) 200-200-20 MG/5ML suspension 30 mL  30 mL Oral Q6H PRN Rankin, Shuvon B, NP      . atorvastatin (LIPITOR) tablet 80 mg  80 mg Oral Daily Eugenie Filler, MD   80 mg at 03/20/20 [id number]  . enalapril (VASOTEC) tablet 2.5 mg  2.5 mg Oral Daily Sharma Covert, MD   2.5 mg at 03/20/20 [id number]  . insulin aspart (novoLOG) injection 0-15 Units  0-15 Units Subcutaneous TID WC Eugenie Filler, MD   2 Units at 03/19/20 1755  . insulin aspart (novoLOG)  injection 5 Units  5 Units Subcutaneous TID WC Cobos, Myer Peer, MD   5 Units at 03/20/20 1221  . insulin glargine (LANTUS) injection 15 Units  15 Units Subcutaneous [id number] Cobos, Myer Peer, MD   15 Units at 03/20/20 0827  . levothyroxine (SYNTHROID) tablet 125 mcg  125 mcg Oral Q0600 Cobos, Myer Peer, MD   125 mcg at 03/20/20 [phone]  . loperamide (IMODIUM) capsule 2 mg  2 mg Oral PRN Lindell Spar I, NP   2 mg at 03/18/20 2137  . loratadine (CLARITIN) tablet 10 mg  10 mg Oral Daily Eugenie Filler, MD  10 mg at 03/20/20 0824  . meclizine (ANTIVERT) tablet 12.5 mg  12.5 mg Oral TID PRN Money, Lowry Ram, FNP   12.5 mg at 03/19/20 1046  . ondansetron (ZOFRAN) tablet 4 mg  4 mg Oral Q8H PRN Rankin, Shuvon B, NP      . polyethylene glycol (MIRALAX / GLYCOLAX) packet 17 g  17 g Oral Daily PRN Cobos, Fernando A, MD      . QUEtiapine (SEROQUEL) tablet 200 mg  200 mg Oral QHS Money, Travis B, FNP   200 mg at 03/19/20 2201  . QUEtiapine (SEROQUEL) tablet 25 mg  25 mg Oral BID Money, Lowry Ram, FNP   25 mg at 03/20/20 [id number]  . sertraline (ZOLOFT) tablet 100 mg  100 mg Oral Daily Suella Broad, FNP   100 mg at 03/20/20 [id number]  . traZODone (DESYREL) tablet 50 mg  50 mg Oral QHS PRN Money, Lowry Ram, FNP   50 mg at 03/18/20 2137   Lab Results:  Results for orders placed or performed during the hospital encounter of 03/15/20 (from the past 48 hour(s))  Glucose, capillary     Status: Abnormal   Collection Time: 03/18/20  4:41 PM  Result Value Ref Range   Glucose-Capillary 113 (H) 70 - 99 mg/dL    Comment: Glucose reference range applies only to samples taken after fasting for at least 8 hours.  Glucose, capillary     Status: Abnormal   Collection Time: 03/18/20  8:22 PM  Result Value Ref Range   Glucose-Capillary 156 (H) 70 - 99 mg/dL    Comment: Glucose reference range applies only to samples taken after fasting for at least 8 hours.  Glucose, capillary     Status: Abnormal   Collection Time:  03/19/20  5:53 AM  Result Value Ref Range   Glucose-Capillary 120 (H) 70 - 99 mg/dL    Comment: Glucose reference range applies only to samples taken after fasting for at least 8 hours.  Glucose, capillary     Status: Abnormal   Collection Time: 03/19/20 12:21 PM  Result Value Ref Range   Glucose-Capillary 120 (H) 70 - 99 mg/dL    Comment: Glucose reference range applies only to samples taken after fasting for at least 8 hours.  Glucose, capillary     Status: Abnormal   Collection Time: 03/19/20  5:02 PM  Result Value Ref Range   Glucose-Capillary 121 (H) 70 - 99 mg/dL    Comment: Glucose reference range applies only to samples taken after fasting for at least 8 hours.   Comment 1 Notify RN    Comment 2 Document in Chart   Glucose, capillary     Status: Abnormal   Collection Time: 03/19/20  9:14 PM  Result Value Ref Range   Glucose-Capillary 143 (H) 70 - 99 mg/dL    Comment: Glucose reference range applies only to samples taken after fasting for at least 8 hours.  Glucose, capillary     Status: Abnormal   Collection Time: 03/20/20  6:13 AM  Result Value Ref Range   Glucose-Capillary 101 (H) 70 - 99 mg/dL    Comment: Glucose reference range applies only to samples taken after fasting for at least 8 hours.  Glucose, capillary     Status: Abnormal   Collection Time: 03/20/20 12:07 PM  Result Value Ref Range   Glucose-Capillary 118 (H) 70 - 99 mg/dL    Comment: Glucose reference range applies only to samples taken after fasting for at  least 8 hours.   Blood Alcohol level:  Lab Results  Component Value Date   ETH <10 [id number]   Metabolic Disorder Labs: Lab Results  Component Value Date   HGBA1C 7.6 (H) 03/16/2020   HGBA1C 7.6 (H) 03/16/2020   MPG 171.42 03/16/2020   MPG 171.42 03/16/2020   No results found for: PROLACTIN Lab Results  Component Value Date   CHOL 211 (H) 03/16/2020   TRIG 103 03/16/2020   HDL 28 (L) 03/16/2020   CHOLHDL 7.5 03/16/2020   VLDL 21  03/16/2020   LDLCALC 162 (H) 03/16/2020   LDLCALC 175 (H) 06/20/2019   Physical Findings: AIMS: Facial and Oral Movements Muscles of Facial Expression: None, normal Lips and Perioral Area: None, normal Jaw: None, normal Tongue: None, normal,Extremity Movements Upper (arms, wrists, hands, fingers): None, normal Lower (legs, knees, ankles, toes): None, normal, Trunk Movements Neck, shoulders, hips: None, normal, Overall Severity Severity of abnormal movements (highest score from questions above): None, normal Incapacitation due to abnormal movements: None, normal Patient's awareness of abnormal movements (rate only patient's report): No Awareness, Dental Status Current problems with teeth and/or dentures?: No Does patient usually wear dentures?: No  CIWA:    COWS:     Musculoskeletal: Strength & Muscle Tone: within normal limits Gait & Station: normal Patient leans: N/A  Psychiatric Specialty Exam: Physical Exam  Nursing note and vitals reviewed. Constitutional: She is oriented to person, place, and time. She appears well-developed and well-nourished.  Cardiovascular: Normal rate.  Respiratory: Effort normal.  Genitourinary:    Genitourinary Comments: Deferred   Musculoskeletal:        General: Normal range of motion.  Neurological: She is alert and oriented to person, place, and time.  Skin: Skin is warm.    Review of Systems  Constitutional: Negative.   HENT: Negative.   Eyes: Negative.   Respiratory: Negative.   Cardiovascular: Negative.   Gastrointestinal: Negative.   Genitourinary: Negative.   Musculoskeletal: Negative.   Skin: Negative.   Neurological: Positive for dizziness and light-headedness.  Psychiatric/Behavioral: Negative.     Blood pressure 117/73, pulse 94, temperature 97.9 F (36.6 C), temperature source Oral, resp. rate 16, height 5\' 6"  (1.676 m), weight 104.3 kg, SpO2 99 %.Body mass index is 37.12 kg/m.  General Appearance: Fairly Groomed  Eye  Contact:  Fair  Speech:  Clear and Coherent and Normal Rate  Volume:  Decreased  Mood:  Euthymic  Affect:  Flat  Thought Process:  Coherent and Descriptions of Associations: Intact  Orientation:  Full (Time, Place, and Person)  Thought Content:  WDL  Suicidal Thoughts:  No  Homicidal Thoughts:  No  Memory:  Immediate;   Fair Recent;   Fair Remote;   Fair  Judgement:  Fair  Insight:  Fair  Psychomotor Activity:  Normal  Concentration:  Concentration: Fair  Recall:  [employer] of Knowledge:  Fair  Language:  Fair  Akathisia:  No  Handed:  Right  AIMS (if indicated):     Assets:  Communication Skills Desire for Improvement Financial Resources/Insurance Housing Social Support  ADL's:  Intact  Cognition:  WNL  Sleep:  Number of Hours: 6.25   Assessment: Patient presents in her room lying in the bed asleep, but she is easily aroused & able to get up for this assessment.  Patient appears to be groggy & reported feeling sleepy. She seems to be tolerating her medications well and is showing improvement with her mood based on patient's report this morning.  Treatment Plan Summary: Daily contact with patient to assess and evaluate symptoms and progress in treatment and Medication management.  - Continue inpatient hospitalization. - Will continue today 03/20/2020 plan as below except where it is noted.  Continue Lipitor 80 mg p.o. daily for hyperlipidemia Continue Vasotec 2.5 mg p.o. daily for hypertension Continue NovoLog sliding scale for diabetes Continue NovoLog 5 units subcu 3 times daily with meals for meal coverage Continue Lantus 15 units subcu every morning for diabetes Continue Synthroid 125 mcg p.o. daily for hypothyroidism Continue Antivert 12.5 mg p.o. 3 times daily as needed for dizziness Continue Seroquel 200 mg p.o. nightly for mood control. Chaged Seroquel 25 mg p.o. twice daily to PRN for agitation. Continue Zoloft 100 mg p.o. daily for depression and  anxiety Continue trazodone 50 mg p.o. nightly as needed for insomnia Encourage group therapy participation.  Continue Q 15 minute safety checks Discharge disposition plan in progress. Encourage group attendance & participation.  Lindell Spar, NP, PMHNP, FNP-BC 03/20/2020, 1:35 PMPatient ID: [name], female   DOB: January 31, 1976, 44 y.o.   MRN: [id number]

## 2020-03-20 NOTE — Progress Notes (Signed)
Pt reported AVH, hearing banging noises coming from the wall and hearing loud sounds at night. She reported visual hallucinations but was unable to elaborate on what she sees. She denies SI/HI.    03/20/20 1000  Psych Admission Type (Psych Patients Only)  Admission Status Voluntary  Psychosocial Assessment  Patient Complaints Anxiety;Depression  Eye Contact Brief  Facial Expression Flat  Affect Sad  Speech Logical/coherent;Soft;Slow  Interaction Minimal  Motor Activity Slow  Appearance/Hygiene Disheveled  Behavior Characteristics Appropriate to situation  Mood Depressed;Anxious  Aggressive Behavior  Effect No apparent injury  Thought Process  Coherency Concrete thinking  Content WDL  Delusions None reported or observed  Perception Hallucinations  Hallucination Auditory;Visual  Judgment Limited  Confusion None  Danger to Self  Current suicidal ideation? Denies  Self-Injurious Behavior No self-injurious ideation or behavior indicators observed or expressed   Danger to Others  Danger to Others None reported or observed

## 2020-03-20 NOTE — [facility] Group Notes (Signed)
LCSW Group Therapy Note  03/20/2020   10:00-11:00am   Type of Therapy and Topic:  Group Therapy: Anger Cues and Responses  Participation Level:  Active   Description of Group:   In this group, patients learned how to recognize the physical, cognitive, emotional, and behavioral responses they have to anger-provoking situations.  They identified a recent time they became angry and how they reacted.  They analyzed how their reaction was possibly beneficial and how it was possibly unhelpful.  The group discussed a variety of healthier coping skills that could help with such a situation in the future.  Focus was placed on how helpful it is to recognize the underlying emotions to our anger, because working on those can lead to a more permanent solution as well as our ability to focus on the important rather than the urgent.  Therapeutic Goals: 1. Patients will remember their last incident of anger and how they felt emotionally and physically, what their thoughts were at the time, and how they behaved. 2. Patients will identify how their behavior at that time worked for them, as well as how it worked against them. 3. Patients will explore possible new behaviors to use in future anger situations. 4. Patients will learn that anger itself is normal and cannot be eliminated, and that healthier reactions can assist with resolving conflict rather than worsening situations.  Summary of Patient Progress:  The patient shared that she was a afraid of getting angry because what she is capable of. She states she isolates and writes to resolve feelings of conflict. The patient recognizes that anger is a natural part of human life. That they can acquire effective coping skills and work toward having positive outcomes. Patient understands that there emotional and physical cues associated with anger and that these can be used as warning signs alert them to step-back, regroup and use a coping skill. Patient encouraged to  work on managing anger more effectively. Therapeutic Modalities:   Cognitive Behavioral Therapy  Tanya Nolan

## 2020-03-20 NOTE — Progress Notes (Signed)
Adult Psychoeducational Group Note  Date:  03/20/2020 Time:  10:35 AM  Group Topic/Focus:  Goals Group:   The focus of this group is to help patients establish daily goals to achieve during treatment and discuss how the patient can incorporate goal setting into their daily lives to aide in recovery.  Participation Level:  Active  Participation Quality:  Appropriate  Affect:  Appropriate  Cognitive:  Alert  Insight: Appropriate  Engagement in Group:  Engaged  Modes of Intervention:  Discussion  Additional Comments:  Pt attended group and participated in discussion.  Shawndell Varas R [place]imer 03/20/2020, 10:35 AM

## 2020-03-21 LAB — GLUCOSE, CAPILLARY
Glucose-Capillary: 105 mg/dL — ABNORMAL HIGH (ref 70–99)
Glucose-Capillary: 136 mg/dL — ABNORMAL HIGH (ref 70–99)
Glucose-Capillary: 148 mg/dL — ABNORMAL HIGH (ref 70–99)
Glucose-Capillary: 111 mg/dL — ABNORMAL HIGH (ref 70–99)

## 2020-03-21 MED ORDER — CLONIDINE HCL 0.1 MG PO TABS
0.1000 mg | ORAL_TABLET | Freq: Three times a day (TID) | ORAL | Status: DC | PRN
  Administered 2020-03-22 – 2020-03-23 (×2): 0.1 mg via ORAL
  Filled 2020-03-21 (×2): qty 1

## 2020-03-21 NOTE — Progress Notes (Signed)
   03/21/20 2108  Psych Admission Type (Psych Patients Only)  Admission Status Voluntary  Psychosocial Assessment  Patient Complaints Anxiety;Insomnia  Eye Contact Fair  Facial Expression Flat  Affect Appropriate to circumstance  Speech Logical/coherent  Interaction Assertive  Motor Activity Other (Comment) (WDL)  Appearance/Hygiene Disheveled  Behavior Characteristics Appropriate to situation  Mood Anxious  Thought Process  Coherency Concrete thinking  Content WDL  Delusions None reported or observed  Perception Hallucinations  Hallucination Auditory  Judgment Limited  Confusion None  Danger to Self  Current suicidal ideation? Denies  Self-Injurious Behavior No self-injurious ideation or behavior indicators observed or expressed   Agreement Not to Harm Self Yes  Description of Agreement verbally contracts for safety   Danger to Others  Danger to Others None reported or observed

## 2020-03-21 NOTE — Progress Notes (Signed)
D:  Patient's self inventory sheet, patient sleeps good, sleep medication helpful.  Good appetite, normal energy level, good concentration.  Rated depression, hopeless and anxiety 1.  Denied withdrawals.  Denied SI.  Denied physical problems.  Denied physical pain.  Goal is "getting back to me, continue to love me."  Plans to attend groups.  No discharge plans. A:  Medications administered per MD order.   Emotional support and encouragement given patient. R:  Safety maintained with 15 minute checks.  Denied SI and HI, contracts for safety.   Patient does continue to hear mumblings at times.  Denied visual hallucinations.

## 2020-03-21 NOTE — Progress Notes (Signed)
Adult Psychoeducational Group Note  Date:  03/21/2020 Time:  9:30 PM  Group Topic/Focus:  Wrap Up Group  Participation Level:  Active  Participation Quality:  Attentive  Affect:  Appropriate  Cognitive:  Alert  Insight: Improving  Engagement in Group:  Engaged  Modes of Intervention:  Clarification, Discussion, Education and Support  Additional Comments: During group tonight, we discussed what healthy supports are, traits of healthy supports, and what she felt was needed to help her be successful. Pt stated that she needs to encouraged and told no when necessary. [place], [place] 03/21/2020, 9:30 PM

## 2020-03-21 NOTE — Plan of Care (Signed)
Nurse discussed anxiety, depression and coping skills with patient.  

## 2020-03-21 NOTE — Progress Notes (Signed)
[facility] MD Progress Note  03/21/2020 11:57 AM Tanya Nolan  MRN:  [id number]   Subjective: Tanya Nolan reports, tearfully, "Today is our 41th wedding anniversary. I really miss him (my husband). Why did he leave me. I feel so sad today".  Objective: 44 year old AA female, widowed (husband died in 2019-12-04), no children, lives with her mother. Employed part time. Presented to ED via EMS whom she had called . States she has been feeling depressed, sad. States her depression is chronic but has worsened over recent months due to death of loved ones. Her husband died in 12-04-19 and her sister passed away in 04-Jan-2020 from [place]. Tanya Nolan is seen for follow-up. She is crying. Says she is feeling so sad while missing her husband because today is their 25th wedding anniversary. She is however, able to express her thoughts & feelings. She did go back to the group sessions afterwards to continue group milieu. She thinks her medications are getting into her system & her body is adjusting well to the medicines. She says she is not feeling too drowsy today. Still denies any problems with diarrhea today. She feels her mood is improving & that she is getting better. She denies any paranoid ideations today.  She denies any suicidal or homicidal ideations. Denies any AVH, delusional thoughts or paranoia. Tanya Nolan does not appear to be responding to any internal stimuli. Her daytime Seroquel dosing has been changed to PRN to aid decrease daytime sedation. She is in agreement to continue her plan of care as already in progress. No medication changes made today.  Principal Problem: MDD (major depressive disorder), recurrent severe, without psychosis ([place])  Diagnosis: Principal Problem:   MDD (major depressive disorder), recurrent severe, without psychosis ([place])  Total Time spent with patient: 15 minutes  Past Psychiatric History: Anxiety and depression that have worsened since the death of her husband in 12/04/19 and  sister in 2020-01-04. She reports history of one suicide attempt via cutting her wrist 1-2 weeks after her husband passed away. Denies prior hospitalizations  Past Medical History:  Past Medical History:  Diagnosis Date  . Anxiety   . BACK STRAIN, LUMBAR 03/26/2009   Qualifier: Diagnosis of  By: Jerline Pain MD, Anderson Malta    . [place] 10/01/2007   Qualifier: Diagnosis of  By: Genene Churn MD, Janett Billow    . Chronic back pain    tx with ibuprofen  . Diabetes mellitus   . Hypercholesteremia   . Hyperlipidemia    no meds - tx with diet  . Hypertension   . Hypothyroidism   . Ingrown toenail 12/25/2014  . 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   C&B WNL  NEG PAPS after  . Nipple discharge 10/13/2013  . Non compliance w medication regimen 10/13/2013  . 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 Thyroid dysfunction     Past Surgical History:  Procedure Laterality Date  . [place] DECOMPRESSION  2008  . HYSTEROSCOPY X 2  2007 / 2012   ENDOMETRIAL POLYPS REMOVED   Family History:  Family History  Problem Relation Age of Onset  . Hypertension Sister   . Lupus Mother   . Hypertension Mother   . Heart disease Father   . Gout Father   . Lupus Father    Family Psychiatric  History: None reported  Social History:  Social History   Substance and Sexual Activity  Alcohol Use Yes  . Alcohol/week: 0.0 standard drinks  Comment: socially - wine/liquor--Rare     Social History   Substance and Sexual Activity  Drug Use No   Comment: past use "years ago" per patient    Social History   Socioeconomic History  . Marital status: Married    Spouse name: Not on file  . Number of children: Not on file  . Years of education: Not on file  . Highest education level: Not on file  Occupational History  . Not on file  Tobacco Use  . Smoking status: Current Every Day Smoker    Packs/day: 0.50    Types: Cigarettes  . Smokeless tobacco: Never Used   Substance and Sexual Activity  . Alcohol use: Yes    Alcohol/week: 0.0 standard drinks    Comment: socially - wine/liquor--Rare  . Drug use: No    Comment: past use "years ago" per patient  . Sexual activity: Yes    Birth control/protection: None    Comment: 1st intercourse 44 yo-More than 5 partners  Other Topics Concern  . Not on file  Social History Narrative  . Not on file   Social Determinants of Health   Financial Resource Strain:   . Difficulty of Paying Living Expenses:   Food Insecurity:   . Worried About Charity fundraiser in the Last Year:   . Arboriculturist in the Last Year:   Transportation Needs:   . Film/video editor (Medical):   Marland Kitchen Lack of Transportation (Non-Medical):   Physical Activity:   . Days of Exercise per Week:   . Minutes of Exercise per Session:   Stress:   . Feeling of Stress :   Social Connections:   . Frequency of Communication with Friends and Family:   . Frequency of Social Gatherings with Friends and Family:   . Attends Religious Services:   . Active Member of Clubs or Organizations:   . Attends Archivist Meetings:   Marland Kitchen Marital Status:    Additional Social History:   Sleep: Good  Appetite:  Good  Current Medications: Current Facility-Administered Medications  Medication Dose Route Frequency Provider Last Rate Last Admin  . acetaminophen (TYLENOL) tablet 650 mg  650 mg Oral Q4H PRN Rankin, Shuvon B, NP      . alum & mag hydroxide-simeth (MAALOX/MYLANTA) 200-200-20 MG/5ML suspension 30 mL  30 mL Oral Q6H PRN Rankin, Shuvon B, NP      . atorvastatin (LIPITOR) tablet 80 mg  80 mg Oral Daily Eugenie Filler, MD   80 mg at 03/21/20 0842  . enalapril (VASOTEC) tablet 2.5 mg  2.5 mg Oral Daily Sharma Covert, MD   2.5 mg at 03/21/20 0842  . insulin aspart (novoLOG) injection 0-15 Units  0-15 Units Subcutaneous TID WC Eugenie Filler, MD   3 Units at 03/20/20 1711  . insulin aspart (novoLOG) injection 5 Units  5  Units Subcutaneous TID WC Cobos, Myer Peer, MD   5 Units at 03/21/20 [phone]  . insulin glargine (LANTUS) injection 15 Units  15 Units Subcutaneous [id number] Cobos, Myer Peer, MD   15 Units at 03/21/20 0845  . levothyroxine (SYNTHROID) tablet 125 mcg  125 mcg Oral Q0600 Cobos, Myer Peer, MD   125 mcg at 03/21/20 0609  . loperamide (IMODIUM) capsule 2 mg  2 mg Oral PRN Lindell Spar I, NP   2 mg at 03/18/20 2137  . loratadine (CLARITIN) tablet 10 mg  10 mg Oral Daily Eugenie Filler, MD  10 mg at 03/21/20 0842  . meclizine (ANTIVERT) tablet 12.5 mg  12.5 mg Oral TID PRN Money, Lowry Ram, FNP   12.5 mg at 03/19/20 1046  . ondansetron (ZOFRAN) tablet 4 mg  4 mg Oral Q8H PRN Rankin, Shuvon B, NP      . polyethylene glycol (MIRALAX / GLYCOLAX) packet 17 g  17 g Oral Daily PRN Cobos, Fernando A, MD      . QUEtiapine (SEROQUEL) tablet 200 mg  200 mg Oral QHS Money, Travis B, FNP   200 mg at 03/20/20 2109  . QUEtiapine (SEROQUEL) tablet 25 mg  25 mg Oral BID PRN Lindell Spar I, NP      . sertraline (ZOLOFT) tablet 100 mg  100 mg Oral Daily Suella Broad, FNP   100 mg at 03/21/20 0843  . traZODone (DESYREL) tablet 50 mg  50 mg Oral QHS PRN Money, Lowry Ram, FNP   50 mg at 03/21/20 0106   Lab Results:  Results for orders placed or performed during the hospital encounter of 03/15/20 (from the past 48 hour(s))  Glucose, capillary     Status: Abnormal   Collection Time: 03/19/20 12:21 PM  Result Value Ref Range   Glucose-Capillary 120 (H) 70 - 99 mg/dL    Comment: Glucose reference range applies only to samples taken after fasting for at least 8 hours.  Glucose, capillary     Status: Abnormal   Collection Time: 03/19/20  5:02 PM  Result Value Ref Range   Glucose-Capillary 121 (H) 70 - 99 mg/dL    Comment: Glucose reference range applies only to samples taken after fasting for at least 8 hours.   Comment 1 Notify RN    Comment 2 Document in Chart   Glucose, capillary     Status: Abnormal    Collection Time: 03/19/20  9:14 PM  Result Value Ref Range   Glucose-Capillary 143 (H) 70 - 99 mg/dL    Comment: Glucose reference range applies only to samples taken after fasting for at least 8 hours.  Glucose, capillary     Status: Abnormal   Collection Time: 03/20/20  6:13 AM  Result Value Ref Range   Glucose-Capillary 101 (H) 70 - 99 mg/dL    Comment: Glucose reference range applies only to samples taken after fasting for at least 8 hours.  Glucose, capillary     Status: Abnormal   Collection Time: 03/20/20 12:07 PM  Result Value Ref Range   Glucose-Capillary 118 (H) 70 - 99 mg/dL    Comment: Glucose reference range applies only to samples taken after fasting for at least 8 hours.  Glucose, capillary     Status: Abnormal   Collection Time: 03/20/20  4:40 PM  Result Value Ref Range   Glucose-Capillary 176 (H) 70 - 99 mg/dL    Comment: Glucose reference range applies only to samples taken after fasting for at least 8 hours.  Glucose, capillary     Status: Abnormal   Collection Time: 03/20/20  9:16 PM  Result Value Ref Range   Glucose-Capillary 121 (H) 70 - 99 mg/dL    Comment: Glucose reference range applies only to samples taken after fasting for at least 8 hours.   Comment 1 Notify RN    Comment 2 Document in Chart   Glucose, capillary     Status: Abnormal   Collection Time: 03/21/20  5:51 AM  Result Value Ref Range   Glucose-Capillary 105 (H) 70 - 99 mg/dL  Comment: Glucose reference range applies only to samples taken after fasting for at least 8 hours.   Blood Alcohol level:  Lab Results  Component Value Date   ETH <10 [id number]   Metabolic Disorder Labs: Lab Results  Component Value Date   HGBA1C 7.6 (H) 03/16/2020   HGBA1C 7.6 (H) 03/16/2020   MPG 171.42 03/16/2020   MPG 171.42 03/16/2020   No results found for: PROLACTIN Lab Results  Component Value Date   CHOL 211 (H) 03/16/2020   TRIG 103 03/16/2020   HDL 28 (L) 03/16/2020   CHOLHDL 7.5 03/16/2020    VLDL 21 03/16/2020   LDLCALC 162 (H) 03/16/2020   LDLCALC 175 (H) 06/20/2019   Physical Findings: AIMS: Facial and Oral Movements Muscles of Facial Expression: None, normal Lips and Perioral Area: None, normal Jaw: None, normal Tongue: None, normal,Extremity Movements Upper (arms, wrists, hands, fingers): None, normal Lower (legs, knees, ankles, toes): None, normal, Trunk Movements Neck, shoulders, hips: None, normal, Overall Severity Severity of abnormal movements (highest score from questions above): None, normal Incapacitation due to abnormal movements: None, normal Patient's awareness of abnormal movements (rate only patient's report): No Awareness, Dental Status Current problems with teeth and/or dentures?: No Does patient usually wear dentures?: No  CIWA:    COWS:     Musculoskeletal: Strength & Muscle Tone: within normal limits Gait & Station: normal Patient leans: N/A  Psychiatric Specialty Exam: Physical Exam  Nursing note and vitals reviewed. Constitutional: She is oriented to person, place, and time. She appears well-developed and well-nourished.  Cardiovascular: Normal rate.  Respiratory: Effort normal.  Genitourinary:    Genitourinary Comments: Deferred   Musculoskeletal:        General: Normal range of motion.  Neurological: She is alert and oriented to person, place, and time.  Skin: Skin is warm.    Review of Systems  Constitutional: Negative.   HENT: Negative.   Eyes: Negative.   Respiratory: Negative.   Cardiovascular: Negative.   Gastrointestinal: Negative.   Genitourinary: Negative.   Musculoskeletal: Negative.   Skin: Negative.   Neurological: Positive for dizziness and light-headedness.  Psychiatric/Behavioral: Negative.     Blood pressure (!) 78/52, pulse (!) 101, temperature 97.9 F (36.6 C), temperature source Oral, resp. rate 20, height 5\' 6"  (1.676 m), weight 104.3 kg, SpO2 99 %.Body mass index is 37.12 kg/m.  General Appearance:  Fairly Groomed  Eye Contact:  Fair  Speech:  Clear and Coherent and Normal Rate  Volume:  Decreased  Mood:  Depressed  Affect:  Flat and Tearful  Thought Process:  Coherent and Descriptions of Associations: Intact  Orientation:  Full (Time, Place, and Person)  Thought Content:  WDL  Suicidal Thoughts:  No  Homicidal Thoughts:  No  Memory:  Immediate;   Fair Recent;   Fair Remote;   Fair  Judgement:  Fair  Insight:  Fair  Psychomotor Activity:  Normal  Concentration:  Concentration: Fair  Recall:  [employer] of Knowledge:  Fair  Language:  Fair  Akathisia:  No  Handed:  Right  AIMS (if indicated):     Assets:  Communication Skills Desire for Improvement Financial Resources/Insurance Housing Social Support  ADL's:  Intact  Cognition:  WNL  Sleep:  Number of Hours: 4   Assessment: Patient presents in her room lying in the bed asleep, but she is easily aroused & able to get up for this assessment.  Patient appears to be groggy & reported feeling sleepy. She seems to be  tolerating her medications well and is showing improvement with her mood based on patient's report this morning.    Treatment Plan Summary: Daily contact with patient to assess and evaluate symptoms and progress in treatment and Medication management.  - Continue inpatient hospitalization. - Will continue today 03/21/2020 plan as below except where it is noted.  Continue Lipitor 80 mg p.o. daily for hyperlipidemia Continue Vasotec 2.5 mg p.o. daily for hypertension Continue NovoLog sliding scale for diabetes Continue NovoLog 5 units subcu 3 times daily with meals for meal coverage Continue Lantus 15 units subcu every morning for diabetes Continue Synthroid 125 mcg p.o. daily for hypothyroidism Continue Antivert 12.5 mg p.o. 3 times daily as needed for dizziness Continue Seroquel 200 mg p.o. nightly for mood control. Continue Seroquel 25 mg p.o. twice daily PRN for agitation. Continue Zoloft 100 mg p.o. daily  for depression and anxiety Continue trazodone 50 mg p.o. nightly as needed for insomnia Encourage group therapy participation.  Continue Q 15 minute safety checks Discharge disposition plan in progress. Encourage group attendance & participation.  Lindell Spar, NP, PMHNP, FNP-BC 03/21/2020, 11:57 AMPatient ID: Tanya Nolan, female   DOB: 10-Jun-1976, 44 y.o.   MRN: [id number] Patient ID: Tanya Nolan, female   DOB: 1976-09-08, 44 y.o.   MRN: [id number]

## 2020-03-21 NOTE — [facility] Group Notes (Signed)
[facility] LCSW Group Therapy Note  Date/Time:  03/21/2020 9:00-10:00 or 10:00-11:00AM  Type of Therapy and Topic:  Group Therapy:  Healthy and Unhealthy Supports  Participation Level:  Active   Description of Group:  Patients in this group were introduced to the idea of adding a variety of healthy supports to address the various needs in their lives.Patients discussed what additional healthy supports could be helpful in their recovery and wellness after discharge in order to prevent future hospitalizations.   An emphasis was placed on using counselor, doctor, therapy groups, 12-step groups, and problem-specific support groups to expand supports.  They also worked as a group on developing a specific plan for several patients to deal with unhealthy supports through [place], psychoeducation with loved ones, and even termination of relationships.   Therapeutic Goals:   1)  discuss importance of adding supports to stay well once out of the hospital  2)  compare healthy versus unhealthy supports and identify some examples of each  3)  generate ideas and descriptions of healthy supports that can be added  4)  offer mutual support about how to address unhealthy supports  5)  encourage active participation in and adherence to discharge plan    Summary of Patient Progress:  The patient stated that current healthy supports in her life are mother and family while current unhealthy supports also include her family.  The patient expressed a willingness to use limiting her time with family members that have shown to be detrimental to her mental health as a way to support to her recovery journey.   Therapeutic Modalities:   Motivational Interviewing Brief Solution-Focused Therapy  Rolanda Jay

## 2020-03-21 NOTE — [facility] Group Notes (Signed)
Adult Psychoeducational Group Note  Date:  03/21/2020 Time:  12:52 PM  Group Topic/Focus:         Progressive Relaxation  Participation Level:  Active  Participation Quality:  Attentive  Affect:  Flat  Cognitive:  Oriented  Insight: Lacking  Engagement in Group:  Lacking  Modes of Intervention:  Activity, Role-play and Support  Additional Comments:  Pt was tearful in the group but wouldn't explain what was going on. When doing the progressive relaxation, Pt participated in some of the activities.  Paulino Rily 03/21/2020, 12:52 PM

## 2020-03-22 LAB — GLUCOSE, CAPILLARY
Glucose-Capillary: 116 mg/dL — ABNORMAL HIGH (ref 70–99)
Glucose-Capillary: 108 mg/dL — ABNORMAL HIGH (ref 70–99)
Glucose-Capillary: 145 mg/dL — ABNORMAL HIGH (ref 70–99)
Glucose-Capillary: 154 mg/dL — ABNORMAL HIGH (ref 70–99)

## 2020-03-22 MED ORDER — TRAZODONE HCL 50 MG PO TABS
50.0000 mg | ORAL_TABLET | Freq: Every evening | ORAL | Status: DC | PRN
  Administered 2020-03-22: 50 mg via ORAL
  Filled 2020-03-22: qty 1

## 2020-03-22 MED ORDER — ARIPIPRAZOLE 5 MG PO TABS
5.0000 mg | ORAL_TABLET | Freq: Every day | ORAL | Status: DC
  Administered 2020-03-22 – 2020-03-25 (×4): 5 mg via ORAL
  Filled 2020-03-22 (×6): qty 1

## 2020-03-22 MED ORDER — SERTRALINE HCL 50 MG PO TABS
150.0000 mg | ORAL_TABLET | Freq: Every day | ORAL | Status: DC
  Administered 2020-03-23 – 2020-03-25 (×3): 150 mg via ORAL
  Filled 2020-03-22 (×5): qty 3

## 2020-03-22 NOTE — Progress Notes (Signed)
   03/21/20 2107  COVID-19 Daily Checkoff  Have you had a fever (temp > 37.80C/100F)  in the past 24 hours?  No  If you have had runny nose, nasal congestion, sneezing in the past 24 hours, has it worsened? No  COVID-19 EXPOSURE  Have you traveled outside the state in the past 14 days? No  Have you been in contact with someone with a confirmed diagnosis of COVID-19 or PUI in the past 14 days without wearing appropriate PPE? No  Have you been living in the same home as a person with confirmed diagnosis of COVID-19 or a PUI (household contact)? No  Have you been diagnosed with COVID-19? No

## 2020-03-22 NOTE — Tx Team (Signed)
Interdisciplinary Treatment and Diagnostic Plan Update  03/22/2020 Time of Session: 9:20am Tanya Nolan MRN: [id number]  Principal Diagnosis: MDD (major depressive disorder), recurrent severe, without psychosis ([place])  Secondary Diagnoses: Principal Problem:   MDD (major depressive disorder), recurrent severe, without psychosis ([place])   Current Medications:  Current Facility-Administered Medications  Medication Dose Route Frequency Provider Last Rate Last Admin  . acetaminophen (TYLENOL) tablet 650 mg  650 mg Oral Q4H PRN Rankin, Shuvon B, NP      . alum & mag hydroxide-simeth (MAALOX/MYLANTA) 200-200-20 MG/5ML suspension 30 mL  30 mL Oral Q6H PRN Rankin, Shuvon B, NP      . ARIPiprazole (ABILIFY) tablet 5 mg  5 mg Oral Daily Cobos, Fernando A, MD      . atorvastatin (LIPITOR) tablet 80 mg  80 mg Oral Daily Eugenie Filler, MD   80 mg at 03/22/20 0806  . cloNIDine (CATAPRES) tablet 0.1 mg  0.1 mg Oral TID PRN Sharma Covert, MD      . insulin aspart (novoLOG) injection 0-15 Units  0-15 Units Subcutaneous TID WC Eugenie Filler, MD   2 Units at 03/21/20 1717  . insulin aspart (novoLOG) injection 5 Units  5 Units Subcutaneous TID WC Cobos, Myer Peer, MD   5 Units at 03/22/20 [phone]  . insulin glargine (LANTUS) injection 15 Units  15 Units Subcutaneous [id number] Cobos, Myer Peer, MD   15 Units at 03/22/20 [phone]  . levothyroxine (SYNTHROID) tablet 125 mcg  125 mcg Oral Q0600 Cobos, Myer Peer, MD   125 mcg at 03/22/20 [phone]  . loperamide (IMODIUM) capsule 2 mg  2 mg Oral PRN Lindell Spar I, NP   2 mg at 03/18/20 2137  . loratadine (CLARITIN) tablet 10 mg  10 mg Oral Daily Eugenie Filler, MD   10 mg at 03/22/20 5188  . meclizine (ANTIVERT) tablet 12.5 mg  12.5 mg Oral TID PRN Money, Lowry Ram, FNP   12.5 mg at 03/19/20 1046  . polyethylene glycol (MIRALAX / GLYCOLAX) packet 17 g  17 g Oral Daily PRN Cobos, Myer Peer, MD      . Derrill Memo ON 03/23/2020] sertraline (ZOLOFT) tablet 150 mg  150  mg Oral Daily Cobos, Myer Peer, MD       PTA Medications: Medications Prior to Admission  Medication Sig Dispense Refill Last Dose  . Accu-Chek FastClix Lancets MISC Use to check CBG TID 100 each 12   . acetaminophen (TYLENOL) 325 MG tablet Take 650 mg by mouth every 6 (six) hours as needed for mild pain or moderate pain.     Marland Kitchen albuterol (PROVENTIL HFA;VENTOLIN HFA) 108 (90 Base) MCG/ACT inhaler Inhale 2 puffs into the lungs every 6 (six) hours as needed for wheezing or shortness of breath. (Patient not taking: Reported on 06/20/2019) 1 Inhaler 0   . atorvastatin (LIPITOR) 80 MG tablet Take 1 tablet (80 mg total) by mouth daily. (Patient not taking: Reported on 08/01/2019) 90 tablet 1   . BD ULTRA-FINE PEN NEEDLES 29G X 12.7MM MISC USE THREE TIMES A DAY BEFORE MEALS 1 each 2   . Blood Glucose Monitoring Suppl (ACCU-CHEK GUIDE) w/Device KIT 1 applicator by Does not apply route 3 (three) times daily. Use to check CBG TID 1 kit 0   . cetirizine (ZYRTEC) 10 MG tablet Take 1 tablet (10 mg total) by mouth daily. (Patient not taking: Reported on 06/20/2019) 30 tablet 11   . clonazePAM (KLONOPIN) 0.5 MG tablet Take 1 tablet (0.5 mg  total) by mouth 2 (two) times daily as needed for anxiety. (Patient not taking: Reported on 03/14/2020) 20 tablet 0   . enalapril (VASOTEC) 10 MG tablet Take 1 tablet (10 mg total) by mouth daily. (Patient not taking: Reported on 08/01/2019) 90 tablet 1   . glucose blood test strip Use to check GBG TID 100 each 12   . Guaifenesin 1200 MG TB12 Take 1 tablet (1,200 mg total) by mouth 2 (two) times daily. (Patient not taking: Reported on 03/14/2020) 20 tablet 0   . Insulin Glargine (BASAGLAR KWIKPEN) 100 UNIT/ML SOPN Inject 0.15 mLs (15 Units total) into the skin every morning. (Patient not taking: Reported on 03/14/2020) 3 mL 11   . insulin lispro (HUMALOG KWIKPEN) 100 UNIT/ML KwikPen Inject 0.05 mLs (5 Units total) into the skin 3 (three) times daily. (Patient not taking: Reported on  03/14/2020) 30 mL 1   . levothyroxine (SYNTHROID) 125 MCG tablet Take 1 tablet (125 mcg total) by mouth daily before breakfast. Reported on 02/23/2016 (Patient not taking: Reported on 08/01/2019) 90 tablet 1   . meloxicam (MOBIC) 15 MG tablet Take 1 tablet (15 mg total) by mouth daily as needed. (Patient not taking: Reported on 03/14/2020) 30 tablet 1   . ondansetron (ZOFRAN-ODT) 4 MG disintegrating tablet Take 1 tablet (4 mg total) by mouth every 8 (eight) hours as needed for nausea or vomiting. (Patient not taking: Reported on 08/01/2019) 20 tablet 0   . polyethylene glycol powder (GLYCOLAX/MIRALAX) 17 GM/SCOOP powder Take 17 g by mouth daily as needed. (Patient not taking: Reported on 08/01/2019) 3350 g 1   . promethazine-dextromethorphan (PROMETHAZINE-DM) 6.25-15 MG/5ML syrup Take 5 mLs by mouth 4 (four) times daily as needed for cough. (Patient not taking: Reported on 03/14/2020) 240 mL 0   . sertraline (ZOLOFT) 50 MG tablet Take 1 tablet (50 mg total) by mouth daily. (Patient not taking: Reported on 03/14/2020) 90 tablet 0   . triamcinolone ointment (KENALOG) 0.5 % Apply 1 application topically 2 (two) times daily. (Patient not taking: Reported on 03/14/2020) 30 g 0     Patient Stressors: Health problems Loss of significant other. Medication change or noncompliance  Patient Strengths: Ability for insight Capable of independent living Communication skills Supportive family/friends  Treatment Modalities: Medication Management, Group therapy, Case management,  1 to 1 session with clinician, Psychoeducation, Recreational therapy.   Physician Treatment Plan for Primary Diagnosis: MDD (major depressive disorder), recurrent severe, without psychosis ([place]) Long Term Goal(s): Improvement in symptoms so as ready for discharge Improvement in symptoms so as ready for discharge   Short Term Goals: Ability to identify changes in lifestyle to reduce recurrence of condition will improve Ability to verbalize  feelings will improve Ability to disclose and discuss suicidal ideas Ability to demonstrate self-control will improve Ability to identify and develop effective coping behaviors will improve  Medication Management: Evaluate patient's response, side effects, and tolerance of medication regimen.  Therapeutic Interventions: 1 to 1 sessions, Unit Group sessions and Medication administration.  Evaluation of Outcomes: Progressing  Physician Treatment Plan for Secondary Diagnosis: Principal Problem:   MDD (major depressive disorder), recurrent severe, without psychosis ([place])  Long Term Goal(s): Improvement in symptoms so as ready for discharge Improvement in symptoms so as ready for discharge   Short Term Goals: Ability to identify changes in lifestyle to reduce recurrence of condition will improve Ability to verbalize feelings will improve Ability to disclose and discuss suicidal ideas Ability to demonstrate self-control will improve Ability to identify and  develop effective coping behaviors will improve     Medication Management: Evaluate patient's response, side effects, and tolerance of medication regimen.  Therapeutic Interventions: 1 to 1 sessions, Unit Group sessions and Medication administration.  Evaluation of Outcomes: Progressing   RN Treatment Plan for Primary Diagnosis: MDD (major depressive disorder), recurrent severe, without psychosis ([place]) Long Term Goal(s): Knowledge of disease and therapeutic regimen to maintain health will improve  Short Term Goals: Ability to verbalize feelings will improve, Ability to disclose and discuss suicidal ideas, Ability to identify and develop effective coping behaviors will improve and Compliance with prescribed medications will improve  Medication Management: RN will administer medications as ordered by provider, will assess and evaluate patient's response and provide education to patient for prescribed medication. RN will report any  adverse and/or side effects to prescribing provider.  Therapeutic Interventions: 1 on 1 counseling sessions, Psychoeducation, Medication administration, Evaluate responses to treatment, Monitor vital signs and CBGs as ordered, Perform/monitor CIWA, COWS, AIMS and Fall Risk screenings as ordered, Perform wound care treatments as ordered.  Evaluation of Outcomes: Progressing   LCSW Treatment Plan for Primary Diagnosis: MDD (major depressive disorder), recurrent severe, without psychosis ([place]) Long Term Goal(s): Safe transition to appropriate next level of care at discharge, Engage patient in therapeutic group addressing interpersonal concerns.  Short Term Goals: Engage patient in aftercare planning with referrals and resources  Therapeutic Interventions: Assess for all discharge needs, 1 to 1 time with Social worker, Explore available resources and support systems, Assess for adequacy in community support network, Educate family and significant other(s) on suicide prevention, Complete Psychosocial Assessment, Interpersonal group therapy.  Evaluation of Outcomes: Progressing   Progress in Treatment: Attending groups: Yes. Participating in groups: Yes. Taking medication as prescribed: Yes. Toleration medication: Yes. Family/Significant other contact made: No, will contact:  if patient consents to collateral contacts Patient understands diagnosis: Yes. Discussing patient identified problems/goals with staff: Yes. Medical problems stabilized or resolved: Yes. Denies suicidal/homicidal ideation: No. Passive SI Issues/concerns per patient self-inventory: No. Other:   New problem(s) identified: None   New Short Term/Long Term Goal(s): medication stabilization, elimination of SI thoughts, development of comprehensive mental wellness plan.    Patient Goals:  "Help me with the thoughts in my head"   Discharge Plan or Barriers: Patient recently admitted. CSW will continue to follow and assess  for appropriate referrals and possible discharge planning.    Reason for Continuation of Hospitalization: Depression Medication stabilization Suicidal ideation  Estimated Length of Stay: 3-5 days   Attendees: Patient: Tanya Nolan  03/22/2020 11:04 AM  Physician: Dr. Neita Garnet, MD 03/22/2020 11:04 AM  Nursing:  03/22/2020 11:04 AM  RN Care Manager: 03/22/2020 11:04 AM  Social Worker: Radonna Ricker, LCSW 03/22/2020 11:04 AM  Recreational Therapist:  03/22/2020 11:04 AM  Other: Marvia Pickles, NP 03/22/2020 11:04 AM  Other:  03/22/2020 11:04 AM  Other: 03/22/2020 11:04 AM    Scribe for Treatment Team: Marylee Floras, [place] 03/22/2020 11:04 AM

## 2020-03-22 NOTE — Progress Notes (Signed)
D:  Patient's self inventory sheet, patient has fair sleep, sleep medication helpful.  Good appetite, normal energy level, good concentration.  Rated depression 6, hopeless and anxiety 5.  Denied withdrawals.  Denied SI.  Denied physical problems.  Denied physical pain. A:  Medications administered per MD orders.  Emotional support and encouragement given patient. R: Denied SI and HI, contracts for safety.  Denied A/V hallucinations.  Safety maintained with 15 minute checks.

## 2020-03-22 NOTE — Progress Notes (Addendum)
[facility] MD Progress Note  03/22/2020 9:14 AM Tanya Nolan  MRN:  [id number]   Subjective: patient reports some improvement compared to admission but reports she is still feeling sad/depressed . Denies suicidal ideations.   Objective:  I have discussed case with treatment team and have met with patient. 44 year old female, presented for worsening depression, neuro-vegetative symptoms of depression, anxiety, suicidal ideations , paranoid ideations. Described recent losses of loved ones- husband passed away in 27-Oct-2019 and sister died in November 27, 2019.  Today patient describes persistent depression, sadness, although acknowledges some improvement compared to admission. She denies suicidal ideations or self injurious ideations and contracts for safety on unit . She continues to endorse vague paranoid ideations- states she feels as if  " someone is after me, maybe trying to kill me" but describes these thoughts as vague and  no formed delusional ideations reported at this time. Currently denies hallucinations and does not appear internally preoccupied. No disruptive or agitated behaviors on unit. Visible in day room- some group participation.  Reports some dizziness/lightheadedness associated with medication.  CBG today 116   Principal Problem: MDD (major depressive disorder), recurrent severe, without psychosis ([place])  Diagnosis: Principal Problem:   MDD (major depressive disorder), recurrent severe, without psychosis (Silas)  Total Time spent with patient: 15 minutes  Past Psychiatric History: Anxiety and depression that have worsened since the death of her husband in Oct 27, 2019 and sister in November 27, 2019. She reports history of one suicide attempt via cutting her wrist 1-2 weeks after her husband passed away. Denies prior hospitalizations  Past Medical History:  Past Medical History:  Diagnosis Date  . Anxiety   . BACK STRAIN, LUMBAR 03/26/2009   Qualifier: Diagnosis of  By: Jerline Pain MD,  Anderson Malta    . Chalazion 10/01/2007   Qualifier: Diagnosis of  By: Genene Churn MD, Janett Billow    . Chronic back pain    tx with ibuprofen  . Diabetes mellitus   . Hypercholesteremia   . Hyperlipidemia    no meds - tx with diet  . Hypertension   . Hypothyroidism   . Ingrown toenail 12/25/2014  . 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   C&B WNL  NEG PAPS after  . Nipple discharge 10/13/2013  . Non compliance w medication regimen 10/13/2013  . 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 Thyroid dysfunction     Past Surgical History:  Procedure Laterality Date  . [place] DECOMPRESSION  2008  . HYSTEROSCOPY X 2  2007 / 2012   ENDOMETRIAL POLYPS REMOVED   Family History:  Family History  Problem Relation Age of Onset  . Hypertension Sister   . Lupus Mother   . Hypertension Mother   . Heart disease Father   . Gout Father   . Lupus Father    Family Psychiatric  History: None reported  Social History:  Social History   Substance and Sexual Activity  Alcohol Use Yes  . Alcohol/week: 0.0 standard drinks   Comment: socially - wine/liquor--Rare     Social History   Substance and Sexual Activity  Drug Use No   Comment: past use "years ago" per patient    Social History   Socioeconomic History  . Marital status: Widowed    Spouse name: Not on file  . Number of children: Not on file  . Years of education: Not on file  . Highest education level: Not on file  Occupational History  . Not on  file  Tobacco Use  . Smoking status: Current Every Day Smoker    Packs/day: 0.50    Types: Cigarettes  . Smokeless tobacco: Never Used  Substance and Sexual Activity  . Alcohol use: Yes    Alcohol/week: 0.0 standard drinks    Comment: socially - wine/liquor--Rare  . Drug use: No    Comment: past use "years ago" per patient  . Sexual activity: Yes    Birth control/protection: None    Comment: 1st intercourse 44 yo-More than 5 partners  Other  Topics Concern  . Not on file  Social History Narrative  . Not on file   Social Determinants of Health   Financial Resource Strain:   . Difficulty of Paying Living Expenses:   Food Insecurity:   . Worried About Charity fundraiser in the Last Year:   . Arboriculturist in the Last Year:   Transportation Needs:   . Film/video editor (Medical):   Marland Kitchen Lack of Transportation (Non-Medical):   Physical Activity:   . Days of Exercise per Week:   . Minutes of Exercise per Session:   Stress:   . Feeling of Stress :   Social Connections:   . Frequency of Communication with Friends and Family:   . Frequency of Social Gatherings with Friends and Family:   . Attends Religious Services:   . Active Member of Clubs or Organizations:   . Attends Archivist Meetings:   Marland Kitchen Marital Status:    Additional Social History:   Sleep: improving   Appetite:  Fair  Current Medications: Current Facility-Administered Medications  Medication Dose Route Frequency Provider Last Rate Last Admin  . acetaminophen (TYLENOL) tablet 650 mg  650 mg Oral Q4H PRN Rankin, Shuvon B, NP      . alum & mag hydroxide-simeth (MAALOX/MYLANTA) 200-200-20 MG/5ML suspension 30 mL  30 mL Oral Q6H PRN Rankin, Shuvon B, NP      . atorvastatin (LIPITOR) tablet 80 mg  80 mg Oral Daily Eugenie Filler, MD   80 mg at 03/22/20 0806  . cloNIDine (CATAPRES) tablet 0.1 mg  0.1 mg Oral TID PRN Sharma Covert, MD      . insulin aspart (novoLOG) injection 0-15 Units  0-15 Units Subcutaneous TID WC Eugenie Filler, MD   2 Units at 03/21/20 1717  . insulin aspart (novoLOG) injection 5 Units  5 Units Subcutaneous TID WC Marcille Barman, Myer Peer, MD   5 Units at 03/22/20 [phone]  . insulin glargine (LANTUS) injection 15 Units  15 Units Subcutaneous BH-q7a Rhayne Chatwin, Myer Peer, MD   15 Units at 03/22/20 [phone]  . levothyroxine (SYNTHROID) tablet 125 mcg  125 mcg Oral Q0600 Raynell Scott, Myer Peer, MD   125 mcg at 03/22/20 [phone]  . loperamide  (IMODIUM) capsule 2 mg  2 mg Oral PRN Lindell Spar I, NP   2 mg at 03/18/20 2137  . loratadine (CLARITIN) tablet 10 mg  10 mg Oral Daily Eugenie Filler, MD   10 mg at 03/22/20 1601  . meclizine (ANTIVERT) tablet 12.5 mg  12.5 mg Oral TID PRN Money, Lowry Ram, FNP   12.5 mg at 03/19/20 1046  . ondansetron (ZOFRAN) tablet 4 mg  4 mg Oral Q8H PRN Rankin, Shuvon B, NP      . polyethylene glycol (MIRALAX / GLYCOLAX) packet 17 g  17 g Oral Daily PRN Syon Tews A, MD      . QUEtiapine (SEROQUEL) tablet 200 mg  200  mg Oral QHS Money, Lowry Ram, FNP   200 mg at 03/21/20 2142  . sertraline (ZOLOFT) tablet 100 mg  100 mg Oral Daily Suella Broad, FNP   100 mg at 03/22/20 6384   Lab Results:  Results for orders placed or performed during the hospital encounter of 03/15/20 (from the past 48 hour(s))  Glucose, capillary     Status: Abnormal   Collection Time: 03/20/20 12:07 PM  Result Value Ref Range   Glucose-Capillary 118 (H) 70 - 99 mg/dL    Comment: Glucose reference range applies only to samples taken after fasting for at least 8 hours.  Glucose, capillary     Status: Abnormal   Collection Time: 03/20/20  4:40 PM  Result Value Ref Range   Glucose-Capillary 176 (H) 70 - 99 mg/dL    Comment: Glucose reference range applies only to samples taken after fasting for at least 8 hours.  Glucose, capillary     Status: Abnormal   Collection Time: 03/20/20  9:16 PM  Result Value Ref Range   Glucose-Capillary 121 (H) 70 - 99 mg/dL    Comment: Glucose reference range applies only to samples taken after fasting for at least 8 hours.   Comment 1 Notify RN    Comment 2 Document in Chart   Glucose, capillary     Status: Abnormal   Collection Time: 03/21/20  5:51 AM  Result Value Ref Range   Glucose-Capillary 105 (H) 70 - 99 mg/dL    Comment: Glucose reference range applies only to samples taken after fasting for at least 8 hours.  Glucose, capillary     Status: Abnormal   Collection Time:  03/21/20 11:57 AM  Result Value Ref Range   Glucose-Capillary 136 (H) 70 - 99 mg/dL    Comment: Glucose reference range applies only to samples taken after fasting for at least 8 hours.   Comment 1 Notify RN    Comment 2 Document in Chart   Glucose, capillary     Status: Abnormal   Collection Time: 03/21/20  4:59 PM  Result Value Ref Range   Glucose-Capillary 148 (H) 70 - 99 mg/dL    Comment: Glucose reference range applies only to samples taken after fasting for at least 8 hours.   Comment 1 Notify RN    Comment 2 Document in Chart   Glucose, capillary     Status: Abnormal   Collection Time: 03/21/20  9:04 PM  Result Value Ref Range   Glucose-Capillary 111 (H) 70 - 99 mg/dL    Comment: Glucose reference range applies only to samples taken after fasting for at least 8 hours.  Glucose, capillary     Status: Abnormal   Collection Time: 03/22/20  6:00 AM  Result Value Ref Range   Glucose-Capillary 116 (H) 70 - 99 mg/dL    Comment: Glucose reference range applies only to samples taken after fasting for at least 8 hours.   Blood Alcohol level:  Lab Results  Component Value Date   ETH <10 66/59/9357   Metabolic Disorder Labs: Lab Results  Component Value Date   HGBA1C 7.6 (H) 03/16/2020   HGBA1C 7.6 (H) 03/16/2020   MPG 171.42 03/16/2020   MPG 171.42 03/16/2020   No results found for: PROLACTIN Lab Results  Component Value Date   CHOL 211 (H) 03/16/2020   TRIG 103 03/16/2020   HDL 28 (L) 03/16/2020   CHOLHDL 7.5 03/16/2020   VLDL 21 03/16/2020   LDLCALC 162 (H) 03/16/2020  LDLCALC 175 (H) 06/20/2019   Physical Findings: AIMS: Facial and Oral Movements Muscles of Facial Expression: None, normal Lips and Perioral Area: None, normal Jaw: None, normal Tongue: None, normal,Extremity Movements Upper (arms, wrists, hands, fingers): None, normal Lower (legs, knees, ankles, toes): None, normal, Trunk Movements Neck, shoulders, hips: None, normal, Overall Severity Severity  of abnormal movements (highest score from questions above): None, normal Incapacitation due to abnormal movements: None, normal Patient's awareness of abnormal movements (rate only patient's report): No Awareness, Dental Status Current problems with teeth and/or dentures?: No Does patient usually wear dentures?: No  CIWA:    COWS:     Musculoskeletal: Strength & Muscle Tone: within normal limits Gait & Station: normal Patient leans: N/A  Psychiatric Specialty Exam: Physical Exam  Nursing note and vitals reviewed. Constitutional: She is oriented to person, place, and time. She appears well-developed and well-nourished.  Cardiovascular: Normal rate.  Respiratory: Effort normal.  Genitourinary:    Genitourinary Comments: Deferred   Musculoskeletal:        General: Normal range of motion.  Neurological: She is alert and oriented to person, place, and time.  Skin: Skin is warm.    Review of Systems  Constitutional: Negative.   HENT: Negative.   Eyes: Negative.   Respiratory: Negative.   Cardiovascular: Negative.   Gastrointestinal: Negative.   Genitourinary: Negative.   Musculoskeletal: Negative.   Skin: Negative.   Neurological: Positive for dizziness and light-headedness.  Psychiatric/Behavioral: Negative.   reports some dizziness, lightheadedness   Blood pressure 118/77, pulse (!) 102, temperature 97.9 F (36.6 C), temperature source Oral, resp. rate 20, height _0  (1.676 m), weight 104.3 kg, SpO2 100 %.Body mass index is 37.12 kg/m.  General Appearance: Fairly Groomed  Eye Contact:  Fair, improves partially during session  Speech:  Normal Rate  Volume:  soft   Mood:  remains depressed   Affect:  constricted   Thought Process:  Linear and Descriptions of Associations: Intact  Orientation:  Other:  fully alert and attentive  Thought Content:  reports paranoid ideations- feelings someone is following her, not internally preoccupied , no hallucinations noted or reported  at this time   Suicidal Thoughts:  No denies currently. Contracts for safety on unit .   Homicidal Thoughts:  No  Memory:  recent and remote grossly intact   Judgement:  Fair/ improving   Insight:  Fair/ improving  Psychomotor Activity:  Normal  Concentration:  Concentration: Good and Attention Span: Good  Recall:  Good  Fund of Knowledge:  Good  Language:  Good  Akathisia:  No  Handed:  Right  AIMS (if indicated):     Assets:  Communication Skills Desire for Improvement Financial Resources/Insurance Housing Social Support  ADL's:  Intact  Cognition:  WNL  Sleep:  Number of Hours: 6   Assessment:  44 year old female, presented for worsening depression, neuro-vegetative symptoms of depression, anxiety, suicidal ideations , paranoid ideations. Described recent losses of loved ones- husband passed away in 11-13-2019 and sister died in 14-Dec-2019.  Patient remains depressed, constricted in affect, although endorsing some improvement compared to admission. She describes some ongoing paranoid ideations, with vague thoughts of being followed/ in danger, but does not elaborate. Denies SI. Reports some dizziness, lightheadedness, which may be related to Seroquel trial. Also, would consider changing to another antipsychotic less likely to be associated with metabolic side effects ( patient has history of DM/Obesity) .  Treatment Plan Summary: Daily contact with patient to assess  and evaluate symptoms and progress in treatment and Medication management. Treatment plan reviewed as below today 5/10 Encourage group and milieu participation D/C Seroquel Start Abilify 5 mgrs QDAY for mood augmentation and psychosis- side effects reviewed Increase  Zoloft to 150  mg p.o. daily for depression and anxiety Continue Lipitor 80 mg p.o. daily for hyperlipidemia Continue Vasotec 2.5 mg p.o. daily for hypertension Continue NovoLog sliding scale for diabetes Continue NovoLog 5 units subcu 3 times  daily with meals for meal coverage Continue Lantus 15 units subcu every morning for diabetes Continue Synthroid 125 mcg p.o. daily for hypothyroidism Continue trazodone 50 mg p.o. nightly as needed for insomnia Encourage PO fluids, monitor vitals regularly/ monitor for  orthostasis Check BMP  Jenne Campus, MD 03/22/2020, 9:14 AM   Patient ID: [name], female   DOB: [birth year], 44 y.o.   MRN: [id number]

## 2020-03-22 NOTE — Progress Notes (Signed)
   03/22/20 2200  Psych Admission Type (Psych Patients Only)  Admission Status Voluntary  Psychosocial Assessment  Patient Complaints Anxiety  Eye Contact Fair  Facial Expression Flat  Affect Appropriate to circumstance  Speech Logical/coherent  Interaction Assertive  Motor Activity Other (Comment) (WDL)  Appearance/Hygiene Disheveled  Behavior Characteristics Cooperative;Anxious  Mood Depressed  Thought Process  Coherency Concrete thinking  Content WDL  Delusions None reported or observed  Perception Hallucinations  Hallucination Auditory  Judgment Limited  Confusion None  Danger to Self  Current suicidal ideation? Passive  Self-Injurious Behavior Some self-injurious ideation observed or expressed.  No lethal plan expressed   Agreement Not to Harm Self Yes  Description of Agreement verbally contracts for safety   Danger to Others  Danger to Others None reported or observed

## 2020-03-22 NOTE — Progress Notes (Signed)
Recreation Therapy Notes  Date: 5.10.21 Time: 0930 Location: [address] Dayroom  Group Topic: Stress Management  Goal Area(s) Addresses:  Patient will identify positive stress management techniques. Patient will identify benefits of using stress management post d/c.  Intervention: Stress Management  Activity:  Meditation.  LRT played a meditation that focused on letting go of the past and focusing on the future.  Patients were to listen and follow along as meditation played to full engage in activity.    Education:  Stress Management, Discharge Planning.   Education Outcome: Acknowledges Education  Clinical Observations/Feedback:  Pt did not attend group activity.    Victorino Sparrow, LRT/CTRS         Ria Comment, Analeese Andreatta A 03/22/2020 11:06 AM

## 2020-03-23 LAB — GLUCOSE, CAPILLARY
Glucose-Capillary: 125 mg/dL — ABNORMAL HIGH (ref 70–99)
Glucose-Capillary: 125 mg/dL — ABNORMAL HIGH (ref 70–99)
Glucose-Capillary: 119 mg/dL — ABNORMAL HIGH (ref 70–99)
Glucose-Capillary: 149 mg/dL — ABNORMAL HIGH (ref 70–99)

## 2020-03-23 LAB — BASIC METABOLIC PANEL
Anion gap: 7 (ref 5–15)
BUN: 10 mg/dL (ref 6–20)
CO2: 29 mmol/L (ref 22–32)
Calcium: 8.9 mg/dL (ref 8.9–10.3)
Chloride: 103 mmol/L (ref 98–111)
Creatinine, Ser: 0.74 mg/dL (ref 0.44–1.00)
GFR calc Af Amer: >60 mL/min (ref >60)
GFR calc non Af Amer: >60 mL/min (ref >60)
Glucose, Bld: 121 mg/dL — ABNORMAL HIGH (ref 70–99)
Potassium: 4.1 mmol/L (ref 3.5–5.1)
Sodium: 139 mmol/L (ref 135–145)

## 2020-03-23 MED ORDER — TRAZODONE HCL 50 MG PO TABS
50.0000 mg | ORAL_TABLET | Freq: Every evening | ORAL | Status: DC | PRN
  Administered 2020-03-23 – 2020-03-24 (×2): 50 mg via ORAL
  Filled 2020-03-23: qty 1

## 2020-03-23 NOTE — Progress Notes (Signed)
Recreation Therapy Notes  Animal-Assisted Activity (AAA) Program Checklist/Progress Notes Patient Eligibility Criteria Checklist & Daily Group note for Rec Tx Intervention  Date: 5.11.21 Time: 56 Location: [address]   AAA/T Program Assumption of Risk Form signed by Teacher, music or Parent Legal Guardian  YES   Patient is free of allergies or sever asthma  YES  Patient reports no fear of animals  YES  Patient reports no history of cruelty to animals  YES   Patient understands his/her participation is voluntary  YES   Patient washes hands before animal contact YES   Patient washes hands after animal contact YES  Behavioral Response: Engaged  Education: Contractor, Appropriate Animal Interaction   Education Outcome: Acknowledges understanding/In group clarification offered/Needs additional education.   Clinical Observations/Feedback: Pt attended and participated in group activity.    Victorino Sparrow, LRT/CTRS         Victorino Sparrow A 03/23/2020 3:31 PM

## 2020-03-23 NOTE — Progress Notes (Signed)
DAR NOTE: Patient presents with a calm affect and depressed mood.  Denies suicidal thoughts, auditory and visual hallucinations.  Rates depression at 0, hopelessness at 2, and anxiety at 0.  Described energy level as normal and concentration as good.  Maintained on routine safety checks.  Medications given as prescribed.  Support and encouragement offered as needed.  Attended group and participated.  States goal for today is "to continue being better than yesterday."  Patient observed socializing with peers in the dayroom.  Patient is safe on and off the unit.

## 2020-03-23 NOTE — Progress Notes (Signed)
Pt had episode earlier this evening where she was anxious/ crying about a situation she did not want to talk about. Pt was informed that we are here when she feels like talking. Pt given her night medications early and pt went to sleep.     03/23/20 2300  Psych Admission Type (Psych Patients Only)  Admission Status Voluntary  Psychosocial Assessment  Patient Complaints Worrying;Crying spells  Eye Contact Fair  Facial Expression Flat  Affect Appropriate to circumstance  Speech Logical/coherent  Interaction Assertive  Motor Activity Other (Comment) (WDL)  Appearance/Hygiene Disheveled  Behavior Characteristics Anxious  Mood Sad  Thought Process  Coherency Concrete thinking  Content WDL  Delusions None reported or observed  Perception Hallucinations  Hallucination Auditory  Judgment Limited  Confusion None  Danger to Self  Current suicidal ideation? Denies  Self-Injurious Behavior Some self-injurious ideation observed or expressed.  No lethal plan expressed   Agreement Not to Harm Self Yes  Description of Agreement verbally contracts for safety   Danger to Others  Danger to Others None reported or observed

## 2020-03-23 NOTE — Progress Notes (Signed)
[facility] MD Progress Note  03/23/2020 12:20 PM Tanya Nolan  MRN:  [id number]   Subjective: patient states that today she is feeling better. Currently denies SI. Does not endorse medication side effects.  Objective:  I have discussed case with treatment team and have met with patient. 44 year old female, presented for worsening depression, neuro-vegetative symptoms of depression, anxiety, suicidal ideations , paranoid ideations. Described recent losses of loved ones- husband passed away in December 10, 2019 and sister died in 01/10/2020.  Today patient reports she is feeling better and at this time presents with a more reactive/ less constricted  affect than yesterday. Smiles at times appropriately during session. I also noted improved grooming and eye contact .  She reports she still feels vaguely paranoid but describes these concerns as lessening and less concerning to her . She reports occasional/intermittent auditory hallucinations which she describes as hearing names being called . Currently does not appear internally preoccupied, no delusions are expressed. Denies medication side effects. No disruptive or agitated behaviors on unit. Presents polite, cooperative on approach. Labs reviewed - BMP unremarkable  ( serum glucose 121). CBG today 125.    Principal Problem: MDD (major depressive disorder), recurrent severe, without psychosis ([place])  Diagnosis: Principal Problem:   MDD (major depressive disorder), recurrent severe, without psychosis ([place])  Total Time spent with patient: 15 minutes  Past Psychiatric History: Anxiety and depression that have worsened since the death of her husband in 10-Dec-2019 and sister in 2020-01-10. She reports history of one suicide attempt via cutting her wrist 1-2 weeks after her husband passed away. Denies prior hospitalizations  Past Medical History:  Past Medical History:  Diagnosis Date  . Anxiety   . BACK STRAIN, LUMBAR 03/26/2009   Qualifier:  Diagnosis of  By: Jerline Pain MD, Anderson Malta    . Chalazion 10/01/2007   Qualifier: Diagnosis of  By: Genene Churn MD, Janett Billow    . Chronic back pain    tx with ibuprofen  . Diabetes mellitus   . Hypercholesteremia   . Hyperlipidemia    no meds - tx with diet  . Hypertension   . Hypothyroidism   . Ingrown toenail 12/25/2014  . 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   C&B WNL  NEG PAPS after  . Nipple discharge 10/13/2013  . Non compliance w medication regimen 10/13/2013  . 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 Thyroid dysfunction     Past Surgical History:  Procedure Laterality Date  . Friona DECOMPRESSION  2008  . HYSTEROSCOPY X 2  2007 / 2012   ENDOMETRIAL POLYPS REMOVED   Family History:  Family History  Problem Relation Age of Onset  . Hypertension Sister   . Lupus Mother   . Hypertension Mother   . Heart disease Father   . Gout Father   . Lupus Father    Family Psychiatric  History: None reported  Social History:  Social History   Substance and Sexual Activity  Alcohol Use Yes  . Alcohol/week: 0.0 standard drinks   Comment: socially - wine/liquor--Rare     Social History   Substance and Sexual Activity  Drug Use No   Comment: past use "years ago" per patient    Social History   Socioeconomic History  . Marital status: Widowed    Spouse name: Not on file  . Number of children: Not on file  . Years of education: Not on file  . Highest education level: Not on  file  Occupational History  . Not on file  Tobacco Use  . Smoking status: Current Every Day Smoker    Packs/day: 0.50    Types: Cigarettes  . Smokeless tobacco: Never Used  Substance and Sexual Activity  . Alcohol use: Yes    Alcohol/week: 0.0 standard drinks    Comment: socially - wine/liquor--Rare  . Drug use: No    Comment: past use "years ago" per patient  . Sexual activity: Yes    Birth control/protection: None    Comment: 1st intercourse 44  yo-More than 5 partners  Other Topics Concern  . Not on file  Social History Narrative  . Not on file   Social Determinants of Health   Financial Resource Strain:   . Difficulty of Paying Living Expenses:   Food Insecurity:   . Worried About Charity fundraiser in the Last Year:   . Arboriculturist in the Last Year:   Transportation Needs:   . Film/video editor (Medical):   Marland Kitchen Lack of Transportation (Non-Medical):   Physical Activity:   . Days of Exercise per Week:   . Minutes of Exercise per Session:   Stress:   . Feeling of Stress :   Social Connections:   . Frequency of Communication with Friends and Family:   . Frequency of Social Gatherings with Friends and Family:   . Attends Religious Services:   . Active Member of Clubs or Organizations:   . Attends Archivist Meetings:   Marland Kitchen Marital Status:    Additional Social History:   Sleep: improving   Appetite:  improving   Current Medications: Current Facility-Administered Medications  Medication Dose Route Frequency Provider Last Rate Last Admin  . acetaminophen (TYLENOL) tablet 650 mg  650 mg Oral Q4H PRN Rankin, Shuvon B, NP      . alum & mag hydroxide-simeth (MAALOX/MYLANTA) 200-200-20 MG/5ML suspension 30 mL  30 mL Oral Q6H PRN Rankin, Shuvon B, NP      . ARIPiprazole (ABILIFY) tablet 5 mg  5 mg Oral Daily Karmello Abercrombie, Myer Peer, MD   5 mg at 03/23/20 0820  . atorvastatin (LIPITOR) tablet 80 mg  80 mg Oral Daily Eugenie Filler, MD   80 mg at 03/23/20 0819  . cloNIDine (CATAPRES) tablet 0.1 mg  0.1 mg Oral TID PRN Sharma Covert, MD   0.1 mg at 03/22/20 2129  . insulin aspart (novoLOG) injection 0-15 Units  0-15 Units Subcutaneous TID WC Eugenie Filler, MD   2 Units at 03/23/20 1157  . insulin aspart (novoLOG) injection 5 Units  5 Units Subcutaneous TID WC Alana Dayton, Myer Peer, MD   5 Units at 03/23/20 1159  . insulin glargine (LANTUS) injection 15 Units  15 Units Subcutaneous BH-q7a Cortez Flippen, Myer Peer,  MD   15 Units at 03/23/20 [phone]  . levothyroxine (SYNTHROID) tablet 125 mcg  125 mcg Oral Q0600 Sharilyn Geisinger, Myer Peer, MD   125 mcg at 03/23/20 [phone]  . loperamide (IMODIUM) capsule 2 mg  2 mg Oral PRN Lindell Spar I, NP   2 mg at 03/18/20 2137  . loratadine (CLARITIN) tablet 10 mg  10 mg Oral Daily Eugenie Filler, MD   10 mg at 03/23/20 0819  . meclizine (ANTIVERT) tablet 12.5 mg  12.5 mg Oral TID PRN Money, Lowry Ram, FNP   12.5 mg at 03/19/20 1046  . polyethylene glycol (MIRALAX / GLYCOLAX) packet 17 g  17 g Oral Daily PRN Diamonds Lippard, Myer Peer,  MD      . sertraline (ZOLOFT) tablet 150 mg  150 mg Oral Daily Lidie Glade, Myer Peer, MD   150 mg at 03/23/20 0820  . traZODone (DESYREL) tablet 50 mg  50 mg Oral QHS PRN,MR X 1 Lindon Romp A, NP   50 mg at 03/22/20 2129   Lab Results:  Results for orders placed or performed during the hospital encounter of 03/15/20 (from the past 48 hour(s))  Glucose, capillary     Status: Abnormal   Collection Time: 03/21/20  4:59 PM  Result Value Ref Range   Glucose-Capillary 148 (H) 70 - 99 mg/dL    Comment: Glucose reference range applies only to samples taken after fasting for at least 8 hours.   Comment 1 Notify RN    Comment 2 Document in Chart   Glucose, capillary     Status: Abnormal   Collection Time: 03/21/20  9:04 PM  Result Value Ref Range   Glucose-Capillary 111 (H) 70 - 99 mg/dL    Comment: Glucose reference range applies only to samples taken after fasting for at least 8 hours.  Glucose, capillary     Status: Abnormal   Collection Time: 03/22/20  6:00 AM  Result Value Ref Range   Glucose-Capillary 116 (H) 70 - 99 mg/dL    Comment: Glucose reference range applies only to samples taken after fasting for at least 8 hours.  Glucose, capillary     Status: Abnormal   Collection Time: 03/22/20 11:49 AM  Result Value Ref Range   Glucose-Capillary 108 (H) 70 - 99 mg/dL    Comment: Glucose reference range applies only to samples taken after fasting for at  least 8 hours.  Glucose, capillary     Status: Abnormal   Collection Time: 03/22/20  4:48 PM  Result Value Ref Range   Glucose-Capillary 145 (H) 70 - 99 mg/dL    Comment: Glucose reference range applies only to samples taken after fasting for at least 8 hours.  Glucose, capillary     Status: Abnormal   Collection Time: 03/22/20  8:29 PM  Result Value Ref Range   Glucose-Capillary 154 (H) 70 - 99 mg/dL    Comment: Glucose reference range applies only to samples taken after fasting for at least 8 hours.  Glucose, capillary     Status: Abnormal   Collection Time: 03/23/20  5:53 AM  Result Value Ref Range   Glucose-Capillary 125 (H) 70 - 99 mg/dL    Comment: Glucose reference range applies only to samples taken after fasting for at least 8 hours.   Comment 1 Notify RN    Comment 2 Document in Chart   Basic metabolic panel     Status: Abnormal   Collection Time: 03/23/20  6:36 AM  Result Value Ref Range   Sodium 139 135 - 145 mmol/L   Potassium 4.1 3.5 - 5.1 mmol/L   Chloride 103 98 - 111 mmol/L   CO2 29 22 - 32 mmol/L   Glucose, Bld 121 (H) 70 - 99 mg/dL    Comment: Glucose reference range applies only to samples taken after fasting for at least 8 hours.   BUN 10 6 - 20 mg/dL   Creatinine, Ser 0.74 0.44 - 1.00 mg/dL   Calcium 8.9 8.9 - 10.3 mg/dL   GFR calc non Af Amer >60 >60 mL/min   GFR calc Af Amer >60 >60 mL/min   Anion gap 7 5 - 15    Comment: Performed at [facility], [place] [address] [place], [place] [address]  Glucose, capillary     Status: Abnormal   Collection Time: 03/23/20 11:36 AM  Result Value Ref Range   Glucose-Capillary 125 (H) 70 - 99 mg/dL    Comment: Glucose reference range applies only to samples taken after fasting for at least 8 hours.   Blood Alcohol level:  Lab Results  Component Value Date   ETH <10 29/56/2130   Metabolic Disorder Labs: Lab Results  Component Value Date   HGBA1C 7.6 (H) 03/16/2020   HGBA1C 7.6 (H) 03/16/2020    MPG 171.42 03/16/2020   MPG 171.42 03/16/2020   No results found for: PROLACTIN Lab Results  Component Value Date   CHOL 211 (H) 03/16/2020   TRIG 103 03/16/2020   HDL 28 (L) 03/16/2020   CHOLHDL 7.5 03/16/2020   VLDL 21 03/16/2020   LDLCALC 162 (H) 03/16/2020   LDLCALC 175 (H) 06/20/2019   Physical Findings: AIMS: Facial and Oral Movements Muscles of Facial Expression: None, normal Lips and Perioral Area: None, normal Jaw: None, normal Tongue: None, normal,Extremity Movements Upper (arms, wrists, hands, fingers): None, normal Lower (legs, knees, ankles, toes): None, normal, Trunk Movements Neck, shoulders, hips: None, normal, Overall Severity Severity of abnormal movements (highest score from questions above): None, normal Incapacitation due to abnormal movements: None, normal Patient's awareness of abnormal movements (rate only patient's report): No Awareness, Dental Status Current problems with teeth and/or dentures?: No Does patient usually wear dentures?: No  CIWA:    COWS:     Musculoskeletal: Strength & Muscle Tone: within normal limits Gait & Station: normal Patient leans: N/A  Psychiatric Specialty Exam: Physical Exam  Nursing note and vitals reviewed. Constitutional: She is oriented to person, place, and time. She appears well-developed and well-nourished.  Cardiovascular: Normal rate.  Respiratory: Effort normal.  Genitourinary:    Genitourinary Comments: Deferred   Musculoskeletal:        General: Normal range of motion.  Neurological: She is alert and oriented to person, place, and time.  Skin: Skin is warm.    Review of Systems  Constitutional: Negative.   HENT: Negative.   Eyes: Negative.   Respiratory: Negative.   Cardiovascular: Negative.   Gastrointestinal: Negative.   Genitourinary: Negative.   Musculoskeletal: Negative.   Skin: Negative.   Neurological: Positive for dizziness and light-headedness.  Psychiatric/Behavioral: Negative.    no lightheadedness reported, no chest pain, no shortness of breath, no vomiting   Blood pressure 109/67, pulse 88, temperature 98.2 F (36.8 C), temperature source Oral, resp. rate 16, height '5\' 6"'  (1.676 m), weight 104.3 kg, SpO2 100 %.Body mass index is 37.12 kg/m.  General Appearance: improved grooming   Eye Contact:  improving eye contact   Speech:  Normal Rate  Volume:  Normal  Mood:  reports she is feeling better today  Affect:  less constricted, smiles at times appropriately  Thought Process:  Linear and Descriptions of Associations: Intact  Orientation:  Other:  fully alert and attentive  Thought Content:  reports decreasing paranoid ideations, at this time no delusions are expressed, reports intermittent auditory hallucinations, does not appear internally preoccupied   Suicidal Thoughts:  No denies currently. Contracts for safety on unit .   Homicidal Thoughts:  No  Memory:  recent and remote grossly intact   Judgement:   improving   Insight:  Fair/ improving  Psychomotor Activity:  Normal  Concentration:  Concentration: Good and Attention Span: Good  Recall:  [place]  of Knowledge:  Good  Language:  Good  Akathisia:  No  Handed:  Right  AIMS (if indicated):     Assets:  Communication Skills Desire for Improvement Financial Resources/Insurance Housing Social Support  ADL's:  Intact  Cognition:  WNL  Sleep:  Number of Hours: 5.25   Assessment:  44 year old female, presented for worsening depression, neuro-vegetative symptoms of depression, anxiety, suicidal ideations , paranoid ideations. Described recent losses of loved ones- husband passed away in 2019-11-22 and sister died in 23-Dec-2019.  Currently patient presents with improving mood and a more reactive affect . Also noted are improving grooming and eye contact . She is tolerating medications well thus far ( now on Ablify trial) . Denies SI.   Treatment Plan Summary: Daily contact with patient to assess  and evaluate symptoms and progress in treatment and Medication management. Treatment plan reviewed as below today 5/11 Encourage group and milieu participation Treatment team working on disposition planning options Continue Abilify 5 mgrs QDAY for mood augmentation and psychosis Continue Zoloft  150  mg p.o. daily for depression and anxiety Continue Lipitor 80 mg p.o. daily for hyperlipidemia Continue Vasotec 2.5 mg p.o. daily for hypertension Continue NovoLog sliding scale for diabetes Continue NovoLog 5 units subcu 3 times daily with meals for meal coverage Continue Lantus 15 units subcu every morning for diabetes Continue Synthroid 125 mcg p.o. daily for hypothyroidism Continue Trazodone 50 mg p.o. nightly as needed for insomnia Encourage PO fluids, monitor vitals regularly/ monitor for  orthostasis   Jenne Campus, MD 03/23/2020, 12:20 PM   Patient ID: [name], female   DOB: 27-Jan-1976, 44 y.o.   MRN: [id number]

## 2020-03-24 LAB — GLUCOSE, CAPILLARY
Glucose-Capillary: 122 mg/dL — ABNORMAL HIGH (ref 70–99)
Glucose-Capillary: 109 mg/dL — ABNORMAL HIGH (ref 70–99)
Glucose-Capillary: 197 mg/dL — ABNORMAL HIGH (ref 70–99)
Glucose-Capillary: 203 mg/dL — ABNORMAL HIGH (ref 70–99)

## 2020-03-24 MED ORDER — ENALAPRIL MALEATE 5 MG PO TABS
5.0000 mg | ORAL_TABLET | Freq: Every day | ORAL | Status: DC
  Administered 2020-03-24: 11:00:00 5 mg via ORAL
  Filled 2020-03-24 (×3): qty 1
  Filled 2020-03-24: qty 2

## 2020-03-24 NOTE — Progress Notes (Addendum)
[facility] MD Progress Note  03/24/2020 9:02 AM JAYELYN BARNO  MRN:  [id number]   Subjective: Continues to endorse improvement but states that yesterday she woke up due to a disturbing, vivid dream related to seeing a family member being shot.  She states that she has seen people being shot in the past but not a family member.  After waking up she felt upset , was tearful.  States that this morning she is again feeling better.  Currently denies suicidal ideations.  Objective:  I have discussed case with treatment team and have met with patient. 44 year old female, presented for worsening depression, neuro-vegetative symptoms of depression, anxiety, suicidal ideations , paranoid ideations. Described recent losses of loved ones- husband passed away in 11/26/2019 and sister died in 2019/12/27.  Today patient presents alert, attentive, calm, polite on approach. Endorses general improvement compared to her admission although today states she feels vaguely anxious following dream referenced above. Her affect is reactive although to a lesser degree than yesterday. Denies suicidal ideations. Currently does not endorse hallucinations and does not appear internally preoccupied.  No delusions or persecutory ideations are expressed. She is currently on Abilify 5 mg daily, Zoloft 150 mg daily tolerating well thus far. CBG today 122 Of note, patient has a history of hypertension.  Prior to admission was prescribed enalapril which she was apparently taking irregularly, does not remember having had side effects.  BP today 144/90.   Principal Problem: MDD (major depressive disorder), recurrent severe, without psychosis ([place])  Diagnosis: Principal Problem:   MDD (major depressive disorder), recurrent severe, without psychosis ([place])  Total Time spent with patient: 15 minutes  Past Psychiatric History: Anxiety and depression that have worsened since the death of her husband in Nov 26, 2019 and sister in 12-27-19. She reports history of one suicide attempt via cutting her wrist 1-2 weeks after her husband passed away. Denies prior hospitalizations  Past Medical History:  Past Medical History:  Diagnosis Date  . Anxiety   . BACK STRAIN, LUMBAR 03/26/2009   Qualifier: Diagnosis of  By: Jerline Pain MD, Anderson Malta    . Chalazion 10/01/2007   Qualifier: Diagnosis of  By: Genene Churn MD, Janett Billow    . Chronic back pain    tx with ibuprofen  . Diabetes mellitus   . Hypercholesteremia   . Hyperlipidemia    no meds - tx with diet  . Hypertension   . Hypothyroidism   . Ingrown toenail 12/25/2014  . 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   C&B WNL  NEG PAPS after  . Nipple discharge 10/13/2013  . Non compliance w medication regimen 10/13/2013  . 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 Thyroid dysfunction     Past Surgical History:  Procedure Laterality Date  . [place] DECOMPRESSION  2008  . HYSTEROSCOPY X 2  2007 / 2012   ENDOMETRIAL POLYPS REMOVED   Family History:  Family History  Problem Relation Age of Onset  . Hypertension Sister   . Lupus Mother   . Hypertension Mother   . Heart disease Father   . Gout Father   . Lupus Father    Family Psychiatric  History: None reported  Social History:  Social History   Substance and Sexual Activity  Alcohol Use Yes  . Alcohol/week: 0.0 standard drinks   Comment: socially - wine/liquor--Rare     Social History   Substance and Sexual Activity  Drug Use No   Comment:  past use "years ago" per patient    Social History   Socioeconomic History  . Marital status: Widowed    Spouse name: Not on file  . Number of children: Not on file  . Years of education: Not on file  . Highest education level: Not on file  Occupational History  . Not on file  Tobacco Use  . Smoking status: Current Every Day Smoker    Packs/day: 0.50    Types: Cigarettes  . Smokeless tobacco: Never Used  Substance and Sexual  Activity  . Alcohol use: Yes    Alcohol/week: 0.0 standard drinks    Comment: socially - wine/liquor--Rare  . Drug use: No    Comment: past use "years ago" per patient  . Sexual activity: Yes    Birth control/protection: None    Comment: 1st intercourse 44 yo-More than 5 partners  Other Topics Concern  . Not on file  Social History Narrative  . Not on file   Social Determinants of Health   Financial Resource Strain:   . Difficulty of Paying Living Expenses:   Food Insecurity:   . Worried About Charity fundraiser in the Last Year:   . Arboriculturist in the Last Year:   Transportation Needs:   . Film/video editor (Medical):   Marland Kitchen Lack of Transportation (Non-Medical):   Physical Activity:   . Days of Exercise per Week:   . Minutes of Exercise per Session:   Stress:   . Feeling of Stress :   Social Connections:   . Frequency of Communication with Friends and Family:   . Frequency of Social Gatherings with Friends and Family:   . Attends Religious Services:   . Active Member of Clubs or Organizations:   . Attends Archivist Meetings:   Marland Kitchen Marital Status:    Additional Social History:   Sleep: Fair  Appetite:  improving   Current Medications: Current Facility-Administered Medications  Medication Dose Route Frequency Provider Last Rate Last Admin  . acetaminophen (TYLENOL) tablet 650 mg  650 mg Oral Q4H PRN Rankin, Shuvon B, NP      . alum & mag hydroxide-simeth (MAALOX/MYLANTA) 200-200-20 MG/5ML suspension 30 mL  30 mL Oral Q6H PRN Rankin, Shuvon B, NP      . ARIPiprazole (ABILIFY) tablet 5 mg  5 mg Oral Daily Aleese Kamps, Myer Peer, MD   5 mg at 03/24/20 0851  . atorvastatin (LIPITOR) tablet 80 mg  80 mg Oral Daily Eugenie Filler, MD   80 mg at 03/24/20 0851  . cloNIDine (CATAPRES) tablet 0.1 mg  0.1 mg Oral TID PRN Sharma Covert, MD   0.1 mg at 03/23/20 2010  . insulin aspart (novoLOG) injection 0-15 Units  0-15 Units Subcutaneous TID WC Eugenie Filler, MD   0 Units at 03/23/20 1751  . insulin aspart (novoLOG) injection 5 Units  5 Units Subcutaneous TID WC Muzamil Harker, Myer Peer, MD   5 Units at 03/24/20 [phone]  . insulin glargine (LANTUS) injection 15 Units  15 Units Subcutaneous BH-q7a Britiny Defrain, Myer Peer, MD   15 Units at 03/24/20 [phone]  . levothyroxine (SYNTHROID) tablet 125 mcg  125 mcg Oral Q0600 Johnita Palleschi, Myer Peer, MD   125 mcg at 03/24/20 0258  . loratadine (CLARITIN) tablet 10 mg  10 mg Oral Daily Eugenie Filler, MD   10 mg at 03/24/20 0851  . sertraline (ZOLOFT) tablet 150 mg  150 mg Oral Daily Gerrica Cygan, Myer Peer, MD  150 mg at 03/24/20 0851  . traZODone (DESYREL) tablet 50 mg  50 mg Oral QHS PRN Anajah Sterbenz, Myer Peer, MD   50 mg at 03/23/20 2010   Lab Results:  Results for orders placed or performed during the hospital encounter of 03/15/20 (from the past 48 hour(s))  Glucose, capillary     Status: Abnormal   Collection Time: 03/22/20 11:49 AM  Result Value Ref Range   Glucose-Capillary 108 (H) 70 - 99 mg/dL    Comment: Glucose reference range applies only to samples taken after fasting for at least 8 hours.  Glucose, capillary     Status: Abnormal   Collection Time: 03/22/20  4:48 PM  Result Value Ref Range   Glucose-Capillary 145 (H) 70 - 99 mg/dL    Comment: Glucose reference range applies only to samples taken after fasting for at least 8 hours.  Glucose, capillary     Status: Abnormal   Collection Time: 03/22/20  8:29 PM  Result Value Ref Range   Glucose-Capillary 154 (H) 70 - 99 mg/dL    Comment: Glucose reference range applies only to samples taken after fasting for at least 8 hours.  Glucose, capillary     Status: Abnormal   Collection Time: 03/23/20  5:53 AM  Result Value Ref Range   Glucose-Capillary 125 (H) 70 - 99 mg/dL    Comment: Glucose reference range applies only to samples taken after fasting for at least 8 hours.   Comment 1 Notify RN    Comment 2 Document in Chart   Basic metabolic panel     Status:  Abnormal   Collection Time: 03/23/20  6:36 AM  Result Value Ref Range   Sodium 139 135 - 145 mmol/L   Potassium 4.1 3.5 - 5.1 mmol/L   Chloride 103 98 - 111 mmol/L   CO2 29 22 - 32 mmol/L   Glucose, Bld 121 (H) 70 - 99 mg/dL    Comment: Glucose reference range applies only to samples taken after fasting for at least 8 hours.   BUN 10 6 - 20 mg/dL   Creatinine, Ser 0.74 0.44 - 1.00 mg/dL   Calcium 8.9 8.9 - 10.3 mg/dL   GFR calc non Af Amer >60 >60 mL/min   GFR calc Af Amer >60 >60 mL/min   Anion gap 7 5 - 15    Comment: Performed at [facility], [place] [address] [place], [place] [address]  Glucose, capillary     Status: Abnormal   Collection Time: 03/23/20 11:36 AM  Result Value Ref Range   Glucose-Capillary 125 (H) 70 - 99 mg/dL    Comment: Glucose reference range applies only to samples taken after fasting for at least 8 hours.  Glucose, capillary     Status: Abnormal   Collection Time: 03/23/20  4:58 PM  Result Value Ref Range   Glucose-Capillary 119 (H) 70 - 99 mg/dL    Comment: Glucose reference range applies only to samples taken after fasting for at least 8 hours.  Glucose, capillary     Status: Abnormal   Collection Time: 03/23/20  7:44 PM  Result Value Ref Range   Glucose-Capillary 149 (H) 70 - 99 mg/dL    Comment: Glucose reference range applies only to samples taken after fasting for at least 8 hours.  Glucose, capillary     Status: Abnormal   Collection Time: 03/24/20  6:04 AM  Result Value Ref Range   Glucose-Capillary 122 (H) 70 - 99 mg/dL  Comment: Glucose reference range applies only to samples taken after fasting for at least 8 hours.   Comment 1 Notify RN    Blood Alcohol level:  Lab Results  Component Value Date   ETH <10 33/00/7622   Metabolic Disorder Labs: Lab Results  Component Value Date   HGBA1C 7.6 (H) 03/16/2020   HGBA1C 7.6 (H) 03/16/2020   MPG 171.42 03/16/2020   MPG 171.42 03/16/2020   No results found for:  PROLACTIN Lab Results  Component Value Date   CHOL 211 (H) 03/16/2020   TRIG 103 03/16/2020   HDL 28 (L) 03/16/2020   CHOLHDL 7.5 03/16/2020   VLDL 21 03/16/2020   LDLCALC 162 (H) 03/16/2020   LDLCALC 175 (H) 06/20/2019   Physical Findings: AIMS: Facial and Oral Movements Muscles of Facial Expression: None, normal Lips and Perioral Area: None, normal Jaw: None, normal Tongue: None, normal,Extremity Movements Upper (arms, wrists, hands, fingers): None, normal Lower (legs, knees, ankles, toes): None, normal, Trunk Movements Neck, shoulders, hips: None, normal, Overall Severity Severity of abnormal movements (highest score from questions above): None, normal Incapacitation due to abnormal movements: None, normal Patient's awareness of abnormal movements (rate only patient's report): No Awareness, Dental Status Current problems with teeth and/or dentures?: No Does patient usually wear dentures?: No  CIWA:    COWS:     Musculoskeletal: Strength & Muscle Tone: within normal limits Gait & Station: normal Patient leans: N/A  Psychiatric Specialty Exam: Physical Exam  Nursing note and vitals reviewed. Constitutional: She is oriented to person, place, and time. She appears well-developed and well-nourished.  Cardiovascular: Normal rate.  Respiratory: Effort normal.  Genitourinary:    Genitourinary Comments: Deferred   Musculoskeletal:        General: Normal range of motion.  Neurological: She is alert and oriented to person, place, and time.  Skin: Skin is warm.    Review of Systems  Constitutional: Negative.   HENT: Negative.   Eyes: Negative.   Respiratory: Negative.   Cardiovascular: Negative.   Gastrointestinal: Negative.   Genitourinary: Negative.   Musculoskeletal: Negative.   Skin: Negative.   Neurological: Positive for dizziness and light-headedness.  Psychiatric/Behavioral: Negative.   No headache, no chest pain, no shortness of breath, no vomiting  Blood  pressure (!) 144/90, pulse 70, temperature 98 F (36.7 C), temperature source Oral, resp. rate 16, height '5\' 6"'  (1.676 m), weight 104.3 kg, SpO2 100 %.Body mass index is 37.12 kg/m.  General Appearance: improved grooming   Eye Contact:  improving eye contact   Speech:  Normal Rate  Volume:  Normal  Mood:  Feeling better but vaguely depressed today  Affect:  Initially constricted, tends to improve during session, does smile at times appropriately  Thought Process:  Linear and Descriptions of Associations: Intact  Orientation:  Other:  fully alert and attentive  Thought Content: Currently does not endorse auditory hallucinations and does not appear internally preoccupied.  No delusions are expressed.  Suicidal Thoughts:  No denies currently. Contracts for safety on unit .   Homicidal Thoughts:  No  Memory:  recent and remote grossly intact   Judgement:   improving   Insight:  Fair/ improving  Psychomotor Activity:  Normal  Concentration:  Concentration: Good and Attention Span: Good  Recall:  Good  Fund of Knowledge:  Good  Language:  Good  Akathisia:  No  Handed:  Right  AIMS (if indicated):     Assets:  Communication Skills Desire for Improvement Financial Resources/Insurance Housing Social  Support  ADL's:  Intact  Cognition:  WNL  Sleep:  Number of Hours: 9.75   Assessment:  44 year old female, presented for worsening depression, neuro-vegetative symptoms of depression, anxiety, suicidal ideations , paranoid ideations. Described recent losses of loved ones- husband passed away in 11/12/2019 and sister died in 12-13-2019.  Patient reports feeling better than she did on admission.  She is presenting with gradually improving mood and range of affect.  She states that today she is again feeling more depressed and anxious following a disturbing/violent dream.  Denies suicidal ideations and remains future oriented.  Tolerating medications well (Abilify, Zoloft). History of  hypertension for which he had been on Vasotec prior to admission.  Does not member having had any side effects.  Have reviewed with hospitalist, will resume Vasotec at 5 mg daily.  Treatment Plan Summary: Daily contact with patient to assess and evaluate symptoms and progress in treatment and Medication management. Treatment plan reviewed as below today 5/12 Encourage group and milieu participation Treatment team working on disposition planning options Continue Abilify 5 mgrs QDAY for mood augmentation and psychosis Continue Zoloft  150  mg p.o. daily for depression and anxiety Continue Lipitor 80 mg p.o. daily for hyperlipidemia Continue Vasotec 5 mg p.o. daily for hypertension Continue NovoLog sliding scale for diabetes Continue NovoLog 5 units subcu 3 times daily with meals for meal coverage Continue Lantus 15 units subcu every morning for diabetes Continue Synthroid 125 mcg p.o. daily for hypothyroidism Continue Trazodone 50 mg p.o. nightly as needed for insomnia    Jenne Campus, MD 03/24/2020, 9:02 AM   Patient ID: [name], female   DOB: 18-Sep-1976, 44 y.o.   MRN: [id number]

## 2020-03-24 NOTE — Progress Notes (Signed)
Spiritual care group on grief and loss facilitated by chaplain Jerene Pitch MDiv, B[facility]  Group Goal:  Support / Education around grief and loss Members engage in facilitated group support and psycho-social education.  Group Description:  Following introductions and group rules, group members engaged in facilitated group dialog and support around topic of loss, with particular support around experiences of loss in their lives. Group Identified types of loss (relationships / self / things) and identified patterns, circumstances, and changes that precipitate losses. Reflected on thoughts / feelings around loss, normalized grief responses, and recognized variety in grief experience.   Group noted Worden's four tasks of grief in discussion.  Group drew on Adlerian / Rogerian, narrative, MI, Patient Progress:  Fiona was present throughout group.  Received support from other group members as she related the death of her spouse in 2019/11/12 and sister in Jan 2021.  Noted that she had not had space to process this, as she has been "caring for everyone else"  Alannis reflected that she has a long-standing pattern of isolation - remembering previous experiences of "holding things in"   She related that she wishes to engage this and is working to find others with whom she can share her grief and find support.  Group normalized emotions and provided empathic presence and support with Santiago Glad.

## 2020-03-24 NOTE — Progress Notes (Signed)
[facility] Group Notes:  (Nursing/MHT/Case Management/Adjunct)  Date:  03/24/2020  Time:  2030 Type of Therapy:  wrap up group  Participation Level:  Active  Participation Quality:  Appropriate, Attentive, Sharing and Supportive  Affect:  Depressed  Cognitive:  Appropriate  Insight:  Improving  Engagement in Group:  Engaged  Modes of Intervention:  Clarification, Education and Support  Summary of Progress/Problems: Positive thinking and positive change were discussed.   Shellia Cleverly 03/24/2020, 9:47 PM

## 2020-03-24 NOTE — [facility] Group Notes (Signed)
LCSW Group Therapy Note 03/24/2020 2:03 PM  Type of Therapy and Topic: Group Therapy: Overcoming Obstacles  Participation Level: Active  Description of Group:  In this group patients will be encouraged to explore what they see as obstacles to their own wellness and recovery. They will be guided to discuss their thoughts, feelings, and behaviors related to these obstacles. The group will process together ways to cope with barriers, with attention given to specific choices patients can make. Each patient will be challenged to identify changes they are motivated to make in order to overcome their obstacles. This group will be process-oriented, with patients participating in exploration of their own experiences as well as giving and receiving support and challenge from other group members.  Therapeutic Goals: 1. Patient will identify personal and current obstacles as they relate to admission. 2. Patient will identify barriers that currently interfere with their wellness or overcoming obstacles.  3. Patient will identify feelings, thought process and behaviors related to these barriers. 4. Patient will identify two changes they are willing to make to overcome these obstacles:   Summary of Patient Progress  [place] was engaged and participated throughout the group session. Rachelle reports that she does not have any concerns at this time. She reports she would like to be referred to an outpatient provider at discharge. Chayla reports she feels "much better" than she did prior to coming into the hospital.    Therapeutic Modalities:  Cognitive Behavioral Therapy Solution Focused Therapy Motivational Interviewing Relapse Prevention Therapy   [place] Social Worker

## 2020-03-24 NOTE — Progress Notes (Signed)
Recreation Therapy Notes  Date:  5.12.21 Time: 0930 Location: [address] Group Room  Group Topic: Stress Management  Goal Area(s) Addresses:  Patient will identify positive stress management techniques. Patient will identify benefits of using stress management post d/c.  Behavioral Response: Engaged  Intervention: Stress Management  Activity: Guided Imagery.  LRT read a script that took patients on a journey to their peaceful place.  Patients were to listen and follow along as script was read to engage in activity.  Education:  Stress Management, Discharge Planning.   Education Outcome: Acknowledges Education  Clinical Observations/Feedback: Pt attended and participated in activity.    Victorino Sparrow, LRT/CTRS         Ria Comment, Vianca Bracher A 03/24/2020 11:00 AM

## 2020-03-24 NOTE — Progress Notes (Signed)
Pt more visible on the unit this evening and appeared to be in better spirits. Pt stated she was feeling better.     03/24/20 2200  Psychosocial Assessment  Patient Complaints Anxiety  Eye Contact Fair  Facial Expression Flat  Affect Appropriate to circumstance  Speech Logical/coherent  Interaction Assertive  Motor Activity Other (Comment) (WDL)  Appearance/Hygiene Disheveled  Behavior Characteristics Cooperative  Mood Pleasant  Thought Process  Coherency Concrete thinking  Content WDL  Delusions None reported or observed  Perception Hallucinations  Hallucination Auditory  Judgment Limited  Confusion None  Danger to Self  Current suicidal ideation? Denies  Self-Injurious Behavior Some self-injurious ideation observed or expressed.  No lethal plan expressed   Agreement Not to Harm Self Yes  Description of Agreement verbally contracts for safety   Danger to Others  Danger to Others None reported or observed

## 2020-03-24 NOTE — Progress Notes (Signed)
   03/24/20 0558  Vital Signs  Temp 98 F (36.7 C)  Temp Source Oral  Pulse Rate 70  BP (!) 144/90  BP Method Automatic  D:  Patient presents with depressed affect. Patient rated depression 4/10.Patient denies SI/HI/ AVH.  Patient was out in open areas, played cards with her peers. Patients blood sugars were 122, 109, 197. A:  Patient took all medication without any contraindications. R: Safety maintained.

## 2020-03-25 LAB — GLUCOSE, CAPILLARY: Glucose-Capillary: 144 mg/dL — ABNORMAL HIGH (ref 70–99)

## 2020-03-25 MED ORDER — ENALAPRIL MALEATE 5 MG PO TABS: 5.0000 mg | ORAL_TABLET | Freq: Every day | ORAL | 0 refills | Status: DC

## 2020-03-25 MED ORDER — TRAZODONE HCL 50 MG PO TABS: 50.0000 mg | ORAL_TABLET | Freq: Every evening | ORAL | 0 refills | Status: DC | PRN

## 2020-03-25 MED ORDER — SERTRALINE HCL 50 MG PO TABS: 150.0000 mg | ORAL_TABLET | Freq: Every day | ORAL | 1 refills | Status: DC

## 2020-03-25 MED ORDER — ARIPIPRAZOLE 5 MG PO TABS: 5.0000 mg | ORAL_TABLET | Freq: Every day | ORAL | 0 refills | Status: DC

## 2020-03-25 NOTE — Progress Notes (Signed)
  [facility] Adult Case Management Discharge Plan :  Will you be returning to the same living situation after discharge:  Yes,  patient is returning home with her mother At discharge, do you have transportation home?: Yes,  patient's mother is picking her up Do you have the ability to pay for your medications: Yes,  Medicaid  Release of information consent forms completed and in the chart;  Patient's signature needed at discharge.  Patient to Follow up at: Follow-up Information    [place] Go to.   Specialty: Professional Counselor Why: For medication management and therapy services, please go to agency during their walk-in hours. Walk in hours are Monday-Friday from 8:30a to 12:00p and 1:00p to 2:30p. Be sure to provide any discharge paperwork, including your list of medications.  Contact information: [place] [phone]           Next level of care provider has access to [facility] and Suicide Prevention discussed: Yes,  with the patient; attempted to contact the patient's collateral contact     Has patient been referred to the Quitline?: Patient refused referral  Patient has been referred for addiction treatment: N/A  Marylee Floras, [place] 03/25/2020, 10:08 AM

## 2020-03-25 NOTE — Discharge Summary (Addendum)
Physician Discharge Summary Note  Patient:  Tanya Nolan is an 44 y.o., female  MRN:  [id number]  DOB:  1975/12/14  Patient phone:  [phone] (home)   Patient address:   [address] [place] [address],   Total Time spent with patient: Greater than 30 minutes  Date of Admission:  03/15/2020  Date of Discharge: 05- 13 -21  Reason for Admission: Worsening symptoms of depression triggered by recent deaths of loved ones.   Principal Problem: MDD (major depressive disorder), recurrent severe, without psychosis ([place])  Discharge Diagnoses: Principal Problem:   MDD (major depressive disorder), recurrent severe, without psychosis ([place])  Past Psychiatric History: Major depressive disorder.  Past Medical History:  Past Medical History:  Diagnosis Date  . Anxiety   . BACK STRAIN, LUMBAR 03/26/2009   Qualifier: Diagnosis of  By: Jerline Pain MD, Anderson Malta    . [place] 10/01/2007   Qualifier: Diagnosis of  By: Genene Churn MD, Janett Billow    . Chronic back pain    tx with ibuprofen  . Diabetes mellitus   . Hypercholesteremia   . Hyperlipidemia    no meds - tx with diet  . Hypertension   . Hypothyroidism   . Ingrown toenail 12/25/2014  . 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   C&B WNL  NEG PAPS after  . Nipple discharge 10/13/2013  . Non compliance w medication regimen 10/13/2013  . 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 Thyroid dysfunction     Past Surgical History:  Procedure Laterality Date  . [place] DECOMPRESSION  2008  . HYSTEROSCOPY X 2  2007 / 2012   ENDOMETRIAL POLYPS REMOVED   Family History:  Family History  Problem Relation Age of Onset  . Hypertension Sister   . Lupus Mother   . Hypertension Mother   . Heart disease Father   . Gout Father   . Lupus Father    Family Psychiatric  History: See H&P  Social History:  Social History   Substance and Sexual Activity  Alcohol Use Yes  . Alcohol/week: 0.0 standard  drinks   Comment: socially - wine/liquor--Rare     Social History   Substance and Sexual Activity  Drug Use No   Comment: past use "years ago" per patient    Social History   Socioeconomic History  . Marital status: Widowed    Spouse name: Not on file  . Number of children: Not on file  . Years of education: Not on file  . Highest education level: Not on file  Occupational History  . Not on file  Tobacco Use  . Smoking status: Current Every Day Smoker    Packs/day: 0.50    Types: Cigarettes  . Smokeless tobacco: Never Used  Substance and Sexual Activity  . Alcohol use: Yes    Alcohol/week: 0.0 standard drinks    Comment: socially - wine/liquor--Rare  . Drug use: No    Comment: past use "years ago" per patient  . Sexual activity: Yes    Birth control/protection: None    Comment: 1st intercourse 44 yo-More than 5 partners  Other Topics Concern  . Not on file  Social History Narrative  . Not on file   Social Determinants of Health   Financial Resource Strain:   . Difficulty of Paying Living Expenses:   Food Insecurity:   . Worried About Charity fundraiser in the Last Year:   . [place] in the Last Year:  Transportation Needs:   . Film/video editor (Medical):   Marland Kitchen Lack of Transportation (Non-Medical):   Physical Activity:   . Days of Exercise per Week:   . Minutes of Exercise per Session:   Stress:   . Feeling of Stress :   Social Connections:   . Frequency of Communication with Friends and Family:   . Frequency of Social Gatherings with Friends and Family:   . Attends Religious Services:   . Active Member of Clubs or Organizations:   . Attends Archivist Meetings:   Marland Kitchen Marital Status:    Hospital Course: (Per Md's admission evaluation notes): 32, widowed (husband died in 2019/11/19), no children, lives with her mother. Employed part time. Presented to ED via EMS whom she had called. States she has been feeling depressed, sad. States  her depression is chronic but has worsened over recent months due to death of loved ones. Her husband died in 11-19-2019 and her sister passed away in December 20, 2019 from [place]. Endorses neuro-vegetative symptoms of depression- endorses sadness, anhedonia, decreased sleep, poor appetite. Reports she has lost about 60 lbs over the last several months . She endorses suicidal ideations, which she currently characterizes as passive, states " sometimes I would rather be gone, not here". In addition to depression, also endorses increased anxiety/ panic attacks. She also reports she has been feeling " paranoid" and states " I feel like there is something going on, like people are out to get me". She reports occasional auditory hallucinations but states " it is more like my own thoughts " . Currently does not present internally preoccupied. Denies prior psychiatric admissions. Reports that she had tried to cut her wrists several days after her husband died, but did not seek treatment at the time. Denies history of mania . Reports vague psychotic symptoms as above. Endorses recent panic attacks. Denies alcohol or drug abuse. Reports history of DM, HTN, Hyperthyroidism. Smokes 1 PPD.NKDA. Reports had not been taking medications for several weeks. Chest X Ray was done in ED due to report of cough: no acute cardiopulmonary findings. Was taking Klonopin PRN and Zoloft - has not been taking recently.   After evaluation of her presenting symptoms, Tanya Nolan was recommended for mood stabilization treatments. The medication regimen for her presenting symptoms were discussed & with her consent initiated. She received, stabilized & was discharged on the medications as listed below on her discharge medication lists. She was also enrolled & participated in the group counseling sessions being offered & held on this unit. She learned coping skills that should help her after discharge to cope better & maintain moos stability. Tanya Nolan  presented on this admission, other pre-existing medical conditions that required treatment & monitoring. She was resumed/discharged on all her pertinent home medications for those health issues. She tolerated her treatment regimen without any adverse effects or reactions reported.   Tanya Nolan's symptoms responded well to her treatment regimen. This is evidenced by her reports of improved mood & presentation of good affect. During the course of her hospitalization, the 15-minute checks were adequate to ensure Tanya Nolan's safety. Patient did not display any dangerous, violent or suicidal behavior on the unit. She interacted with patients & staff appropriately. She participated appropriately in the group sessions/therapies. Her medications were addressed & adjusted to meet her needs. She was recommended for outpatient follow-up care & medication management upon discharge to assure her continuity of care.  At the time of discharge patient is not reporting any acute  suicidal/homicidal ideations. She feels more confident about her self & mental health care. She currently denies any new issues or concerns. Education and supportive counseling provided throughout her hospital stay & upon discharge.   Today upon her discharge evaluation with the attending psychiatrist, Tanya Nolan shares she is doing well. She denies any other specific concerns. She is sleeping well. Her appetite is good. She denies other physical complaints. She denies AH/VH. She feels that her medications have been helpful & is in agreement to continue her current treatment regimen as recommended. She was able to engage in safety planning including plan to return to [facility] or contact emergency services if she feels unable to maintain her own safety or the safety of others. Pt had no further questions, comments, or concerns. She left [facility] with all personal belongings in no apparent distress. Transportation per her mother.  Physical Findings: AIMS: Facial and Oral  Movements Muscles of Facial Expression: None, normal Lips and Perioral Area: None, normal Jaw: None, normal Tongue: None, normal,Extremity Movements Upper (arms, wrists, hands, fingers): None, normal Lower (legs, knees, ankles, toes): None, normal, Trunk Movements Neck, shoulders, hips: None, normal, Overall Severity Severity of abnormal movements (highest score from questions above): None, normal Incapacitation due to abnormal movements: None, normal Patient's awareness of abnormal movements (rate only patient's report): No Awareness, Dental Status Current problems with teeth and/or dentures?: No Does patient usually wear dentures?: No  CIWA:    COWS:     Musculoskeletal: Strength & Muscle Tone: within normal limits Gait & Station: normal Patient leans: N/A  Psychiatric Specialty Exam: Physical Exam  Nursing note and vitals reviewed. Constitutional: She is oriented to person, place, and time. She appears well-developed.  Eyes: Pupils are equal, round, and reactive to light.  Cardiovascular: Normal rate.  Respiratory: No respiratory distress. She has no wheezes.  Genitourinary:    Genitourinary Comments: Deferred   Musculoskeletal:        General: Normal range of motion.     Cervical back: Normal range of motion.  Neurological: She is alert and oriented to person, place, and time.  Skin: Skin is warm and dry.    Review of Systems  Constitutional: Negative for chills, diaphoresis and fever.  HENT: Negative for congestion, rhinorrhea and sneezing.   Eyes: Negative for discharge.  Respiratory: Negative for cough, chest tightness, shortness of breath and wheezing.   Cardiovascular: Negative for chest pain and palpitations.  Gastrointestinal: Negative for diarrhea, nausea and vomiting.  Endocrine: Negative for cold intolerance.  Genitourinary: Negative for difficulty urinating.  Musculoskeletal: Negative.   Skin: Negative.   Allergic/Immunologic: Negative for environmental  allergies and food allergies.  Neurological: Negative for dizziness, tremors, seizures, light-headedness and headaches.  Psychiatric/Behavioral: Positive for dysphoric mood (Stabilized with medication prior to discharge) and sleep disturbance (Stabilized with medication prior to discharge). Negative for agitation, behavioral problems, confusion, decreased concentration, hallucinations, self-injury and suicidal ideas. The patient is not nervous/anxious and is not hyperactive.     Blood pressure (!) 90/54, pulse 96, temperature 98.2 F (36.8 C), temperature source Oral, resp. rate 20, height '5\' 6"'  (1.676 m), weight 104.3 kg, SpO2 100 %.Body mass index is 37.12 kg/m.  See Md's discharge SRA  Sleep:  Number of Hours: 5.75   Has this patient used any form of tobacco in the last 30 days? (Cigarettes, Smokeless Tobacco, Cigars, and/or Pipes): N/A  Blood Alcohol level:  Lab Results  Component Value Date   ETH <10 32/20/2542   Metabolic Disorder Labs:  Lab  Results  Component Value Date   HGBA1C 7.6 (H) 03/16/2020   HGBA1C 7.6 (H) 03/16/2020   MPG 171.42 03/16/2020   MPG 171.42 03/16/2020   No results found for: PROLACTIN Lab Results  Component Value Date   CHOL 211 (H) 03/16/2020   TRIG 103 03/16/2020   HDL 28 (L) 03/16/2020   CHOLHDL 7.5 03/16/2020   VLDL 21 03/16/2020   LDLCALC 162 (H) 03/16/2020   LDLCALC 175 (H) 06/20/2019   See Psychiatric Specialty Exam and Suicide Risk Assessment completed by Attending Physician prior to discharge.  Discharge destination:  Home  Is patient on multiple antipsychotic therapies at discharge:  No   Has Patient had three or more failed trials of antipsychotic monotherapy by history:  No  Recommended Plan for Multiple Antipsychotic Therapies: NA  Allergies as of 03/25/2020   No Known Allergies     Medication List    STOP taking these medications   acetaminophen 325 MG tablet Commonly known as: TYLENOL   clonazePAM 0.5 MG  tablet Commonly known as: KLONOPIN   Guaifenesin 1200 MG Tb12   meloxicam 15 MG tablet Commonly known as: MOBIC   ondansetron 4 MG disintegrating tablet Commonly known as: ZOFRAN-ODT   promethazine-dextromethorphan 6.25-15 MG/5ML syrup Commonly known as: PROMETHAZINE-DM   triamcinolone ointment 0.5 % Commonly known as: KENALOG     TAKE these medications     Indication  Accu-Chek FastClix Lancets Misc Use to check CBG TID  Indication: Type 2 Diabetes   Accu-Chek Guide w/Device Kit 1 applicator by Does not apply route 3 (three) times daily. Use to check CBG TID  Indication: For blood sugar checks   albuterol 108 (90 Base) MCG/ACT inhaler Commonly known as: VENTOLIN HFA Inhale 2 puffs into the lungs every 6 (six) hours as needed for wheezing or shortness of breath.  Indication: Asthma   ARIPiprazole 5 MG tablet Commonly known as: ABILIFY Take 1 tablet (5 mg total) by mouth daily. For mood control Start taking on: Mar 26, 2020  Indication: Mood control   atorvastatin 80 MG tablet Commonly known as: LIPITOR Take 1 tablet (80 mg total) by mouth daily.  Indication: High Amount of Fats in the Blood, High Amount of Triglycerides in the Blood   Basaglar KwikPen 100 UNIT/ML Inject 0.15 mLs (15 Units total) into the skin every morning.  Indication: Type 2 Diabetes   BD ULTRA-FINE PEN NEEDLES 29G X 12.7MM Misc Generic drug: Insulin Pen Needle USE THREE TIMES A DAY BEFORE MEALS  Indication: For diabetes management   cetirizine 10 MG tablet Commonly known as: ZYRTEC Take 1 tablet (10 mg total) by mouth daily.  Indication: Perennial Allergic Rhinitis, Hayfever   enalapril 5 MG tablet Commonly known as: VASOTEC Take 1 tablet (5 mg total) by mouth daily. For high blood pressure What changed:   medication strength  how much to take  additional instructions  Indication: High Blood Pressure Disorder   glucose blood test strip Use to check GBG TID  Indication:  Diabetes   insulin lispro 100 UNIT/ML KwikPen Commonly known as: HumaLOG KwikPen Inject 0.05 mLs (5 Units total) into the skin 3 (three) times daily.  Indication: Type 2 Diabetes   levothyroxine 125 MCG tablet Commonly known as: SYNTHROID Take 1 tablet (125 mcg total) by mouth daily before breakfast. Reported on 02/23/2016  Indication: Underactive Thyroid   polyethylene glycol powder 17 GM/SCOOP powder Commonly known as: GLYCOLAX/MIRALAX Take 17 g by mouth daily as needed.  Indication: Constipation   sertraline  50 MG tablet Commonly known as: ZOLOFT Take 3 tablets (150 mg total) by mouth daily. For depression Start taking on: Mar 26, 2020 What changed:   how much to take  additional instructions  Indication: Major Depressive Disorder   traZODone 50 MG tablet Commonly known as: DESYREL Take 1 tablet (50 mg total) by mouth at bedtime as needed for sleep.  Indication: [place] Go to.   Specialty: Professional Counselor Why: For medication management and therapy services, please go to agency during their walk-in hours. Walk in hours are Monday-Friday from 8:30a to 12:00p and 1:00p to 2:30p. Be sure to provide any discharge paperwork, including your list of medications.  Contact information: [address] [place] [address] [phone]          Follow-up recommendations: Activity:  As tolerated Diet: As recommended by your primary care doctor. Keep all scheduled follow-up appointments as recommended.     Comments: Prescriptions given at discharge.  Patient agreeable to plan.  Given opportunity to ask questions.  Appears to feel comfortable with discharge denies any current suicidal or homicidal thought. Patient is also instructed prior to discharge to: Take all medications as prescribed by his/her mental healthcare provider. Report any adverse effects and or reactions from the  medicines to his/her outpatient provider promptly. Patient has been instructed & cautioned: To not engage in alcohol and or illegal drug use while on prescription medicines. In the event of worsening symptoms, patient is instructed to call the crisis hotline, 911 and or go to the nearest ED for appropriate evaluation and treatment of symptoms. To follow-up with his/her primary care provider for your other medical issues, concerns and or health care needs.  Signed: Lindell Spar, NP, PMHNP, FNP-BC 03/25/2020, 10:00 AM   Patient seen, Suicide Assessment Completed.  Disposition Plan Reviewed

## 2020-03-25 NOTE — Progress Notes (Signed)
RN met with pt and reviewed pt's discharge instructions.  Pt verbalized understanding of discharge instructions and pt did not have any questions. RN returned pt's belongings to pt.   Prescriptions, SRA, AVS and transition record were given to pt.  Pt denied SI/HI/AVH and voiced no concerns.  Pt was appreciative of the care pt received at [facility].  Patient discharged to the lobby without incident.

## 2020-03-25 NOTE — BHH Suicide Risk Assessment (Signed)
[facility] Discharge Suicide Risk Assessment   Principal Problem: MDD (major depressive disorder), recurrent severe, without psychosis ([place]) Discharge Diagnoses: Principal Problem:   MDD (major depressive disorder), recurrent severe, without psychosis ([place])   Total Time spent with patient: 30 minutes  Musculoskeletal: Strength & Muscle Tone: within normal limits Gait & Station: normal Patient leans: N/A  Psychiatric Specialty Exam: Review of Systems denies headache, no chest pain , no shortness of breath, no vomiting   Blood pressure (!) 90/54, pulse 96, temperature 98.2 F (36.8 C), temperature source Oral, resp. rate 20, height 5\' 6"  (1.676 m), weight 104.3 kg, SpO2 100 %.Body mass index is 37.12 kg/m.  General Appearance: Well Groomed  Eye Contact::  Good  Speech:  Normal Rate409  Volume:  Normal  Mood:  reports she is feeling better than on admission, and describes mood as 7-8/10 with 10 being best   Affect:  more reactive   Thought Process:  Linear and Descriptions of Associations: Intact  Orientation:  Other:  fully alert and attentive  Thought Content:  no hallucinations, not internally preoccupied , decreased/improved persecutory ideations  Suicidal Thoughts:  No denies suicidal or self injurious ideations, no homicidal or violent ideations  Homicidal Thoughts:  No  Memory:  recent and remote grossly intact   Judgement:  Other:  improving  Insight:  improving   Psychomotor Activity:  Normal  Concentration:  Good  Recall:  Good  Fund of Knowledge:Good  Language: Good  Akathisia:  Negative  Handed:  Right  AIMS (if indicated):     Assets:  Communication Skills Desire for Improvement Resilience  Sleep:  Number of Hours: 5.75  Cognition: WNL  ADL's:  Intact   Mental Status Per Nursing Assessment::   On Admission:  Self-harm thoughts  Demographic Factors:  3, widowed, no children, lives with mother  Loss Factors: Loss of loved ones, husband died in 22-Nov-2019  and a sister in January 2021  Historical Factors: Reports past history of depression, but no prior psychiatric admissions . History of vague paranoid/persecutory ideations  Risk Reduction Factors:   Sense of responsibility to family, Living with another person, especially a relative, Positive social support and Positive coping skills or problem solving skills  Continued Clinical Symptoms:  At this time patient is alert, attentive, well related, well groomed, presents calm and in no acute distress. Mood has improved and affect is more reactive. Currently no SI and is future oriented . No HI. Reports some lingering paranoid ideations, described as a vague feeling that someone may be watching or monitoring her , but states that this has also improved compared to admission, states " I really don't care what others my do or think about me, I just want to live my life ". No hallucinations . Not internally preoccupied.  Denies medication side effects. Side effects reviewed. Behavior on unit calm and in good control. Interacting with peers appropriately. * patient has a history of HTN. For which she was on Vasotec. Vasotec was restarted at 5 mgrs QDAY but held today as BP 90/54. Patient currently denies feeling dizzy or lightheaded and is not tachycardic. She states she plans to continue monitoring her BP at home ( has BP cuff at home) and review ongoing management with her PCP.  * CBG today 144   Cognitive Features That Contribute To Risk:  No gross cognitive deficits noted upon discharge. Is alert , attentive, and oriented x 3   Suicide Risk:  Mild:  Suicidal ideation of limited  frequency, intensity, duration, and specificity.  There are no identifiable plans, no associated intent, mild dysphoria and related symptoms, good self-control (both objective and subjective assessment), few other risk factors, and identifiable protective factors, including available and accessible social support.  Follow-up  Information    [place]. Go to.   Specialty: Professional Counselor Why: For medication management and therapy services, please go to agency during their walk-in hours. Walk in hours are Monday-Friday from 8:30a to 12:00p and 1:00p to 2:30p. Be sure to provide any discharge paperwork, including your list of medications.  Contact information: [address] [place] [address] [phone]           Plan Of Care/Follow-up recommendations:  Activity:  as tolerated  Diet:  diabetic, heart healthy diet Tests:  NA Other:  See below  Patient is expressing readiness for discharge and is leaving unit in good spirits  Plans to return home Plans to follow up as above  Plans to follow up with her PCP for ongoing medical management, and to discuss further HTN management with PCP.   Jenne Campus, MD 03/25/2020, 10:16 AM

## 2020-04-16 ENCOUNTER — Ambulatory Visit (INDEPENDENT_AMBULATORY_CARE_PROVIDER_SITE_OTHER): Payer: Medicaid Other | Admitting: Family Medicine

## 2020-04-16 ENCOUNTER — Other Ambulatory Visit: Payer: Self-pay

## 2020-04-16 ENCOUNTER — Encounter: Payer: Self-pay | Admitting: Family Medicine

## 2020-04-16 VITALS — BP 132/82 | HR 84 | Wt 219.0 lb

## 2020-04-16 DIAGNOSIS — E785 Hyperlipidemia, unspecified: Secondary | ICD-10-CM

## 2020-04-16 DIAGNOSIS — E1165 Type 2 diabetes mellitus with hyperglycemia: Secondary | ICD-10-CM | POA: Diagnosis not present

## 2020-04-16 DIAGNOSIS — F332 Major depressive disorder, recurrent severe without psychotic features: Secondary | ICD-10-CM | POA: Diagnosis present

## 2020-04-16 DIAGNOSIS — I10 Essential (primary) hypertension: Secondary | ICD-10-CM

## 2020-04-16 MED ORDER — ARIPIPRAZOLE 5 MG PO TABS: 5.0000 mg | ORAL_TABLET | Freq: Every day | ORAL | 1 refills | Status: DC

## 2020-04-16 MED ORDER — BASAGLAR KWIKPEN 100 UNIT/ML ~~LOC~~ SOPN: 15.0000 [IU] | PEN_INJECTOR | SUBCUTANEOUS | 4 refills | Status: DC

## 2020-04-16 MED ORDER — LEVOTHYROXINE SODIUM 125 MCG PO TABS: 125.0000 ug | ORAL_TABLET | Freq: Every day | ORAL | 1 refills | Status: DC

## 2020-04-16 MED ORDER — ENALAPRIL MALEATE 2.5 MG PO TABS: 2.5000 mg | ORAL_TABLET | Freq: Every day | ORAL | 1 refills | Status: DC

## 2020-04-16 MED ORDER — SERTRALINE HCL 50 MG PO TABS: 150.0000 mg | ORAL_TABLET | Freq: Every day | ORAL | 1 refills | Status: DC

## 2020-04-16 MED ORDER — INSULIN LISPRO (1 UNIT DIAL) 100 UNIT/ML (KWIKPEN): 5.0000 [IU] | PEN_INJECTOR | Freq: Three times a day (TID) | SUBCUTANEOUS | 4 refills | Status: DC

## 2020-04-16 MED ORDER — ATORVASTATIN CALCIUM 80 MG PO TABS: 80.0000 mg | ORAL_TABLET | Freq: Every day | ORAL | 1 refills | Status: DC

## 2020-04-16 NOTE — Addendum Note (Signed)
Addended by: Andrena Mews T on: 04/16/2020 03:10 PM   Modules accepted: Orders

## 2020-04-16 NOTE — Assessment & Plan Note (Addendum)
Recent hospitalization for SI. Hospital record/note/lab reviewed. Intermittent SI, currently none. Counseling done. Also evaluated by Erlinda Hong. Continue current regimen as prescribed by the inpatient Psyc. Suicidal hotline given in her AVS summary. Outpatient f/u with Psych as instructed. In the mean time, I refilled her Zoloft and Abilify as prescribed by Psych.   Office Visit from 04/16/2020 in [place]  PHQ-9 Total Score  17

## 2020-04-16 NOTE — Assessment & Plan Note (Addendum)
Poor compliance. Out of Lantus and never asked for refill. I refilled both Lantus and Humalog today. Continue home CBG monitoring. F/U in 4 weeks for reassessment.

## 2020-04-16 NOTE — Progress Notes (Signed)
Dr. Gwendlyn Deutscher asked me to do a secondary assessment of patient due to past SI.  S: Pt reported that she has thoughts of cutting her wrist in May and brought her self to [facility] hospital.  Pt shared that the hospital visit was helpful.  Pt shared that she had thoughts of SI when she drank last week.  Pt reported she feels comfortable and can fight these thoughts SI.  Pt denied intent.   Pt also shared she has thoughts of anxiety and paranoia.  Pt and Dr. Hartford Poli discussed coping strategies such as listening to music.  O: Pt was oriented X4.  Pt was engaged in conversation.  Pt presented down and low eye engagement.  A: Pt is experiencing depression with anxiety syx.  Pt would benefit from medication and continued f/u therapy care.  P: Pt's appt for therapy is not for another month.  F/U with Dr. Hartford Poli next week for safety check-in.  Pt was given info for crisis hotline.

## 2020-04-16 NOTE — Assessment & Plan Note (Signed)
Hypotension with Enalapril 5 mg. Will reduce to 2.5 mg. Refill sent to her pharmacy. She will try a lower dose and let me know if she still has concerns with her BP.

## 2020-04-16 NOTE — Assessment & Plan Note (Signed)
Compliant with Lipitor.

## 2020-04-16 NOTE — Progress Notes (Addendum)
SUBJECTIVE:   CHIEF COMPLAINT / HPI:   MDD: Here for hospital f/u after hospitalization for MDD with suicidal ideation. Since discharged from the hospital she has been having SI. This is triggered by alcohol drinking at [place] 2-3 times per week. Last SI was in the last week of May. No SI since then and no SI today. Last SI attempt was when she was younger by cutting her wrist. She can not go through her ideation whenever she thinks about her nephews and nieces, they keep her going. She endorses compliance with Abilify 5 mg qd, Trazodone 50 mg qhs prn and Zoloft 150 mg qd. Her Psych appointment was scheduled for today. However, she received a call from their office to reschedule her appointment to July 15th. 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  She noted that she heard some voices this morning of people conversing with one another, she was not involved in the conversation. She denies hallucination. Overall, she is improving. She asked for refill of her meds.  DM2:Stopped all insulin since she left the hospital. No CBG check. She is supposed to be on Lantus 15 units QD and Humalog 5 units TID.  HLD/HTN:She stopped her Lisinopril 5 mg qd because it drops her BP and makes her feel dizzy. She had not used it since she left the hospital. She is compliant with her Lipitor 80 mg qd.  PERTINENT  PMH / PSH: PMX reviewed  OBJECTIVE:   Vitals:   04/16/20 0849  BP: 132/82  Pulse: 84  SpO2: 99%  Weight: 219 lb (99.3 kg)    Physical Exam Vitals and nursing note reviewed.  Cardiovascular:     Rate and Rhythm: Normal rate and regular rhythm.     Heart sounds: Normal heart sounds. No murmur.  Pulmonary:     Effort: Pulmonary effort is normal. No respiratory distress.     Breath sounds: Normal breath sounds. No wheezing.  Abdominal:     General: Bowel sounds are normal. There is no distension.   Palpations: Abdomen is soft. There is no mass.     Tenderness: There is no abdominal tenderness.  Musculoskeletal:     Right lower leg: No edema.     Left lower leg: No edema.  Psychiatric:        Attention and Perception: Attention normal.        Mood and Affect: Mood normal.        Thought Content: Thought content does not include homicidal or suicidal ideation. Thought content does not include homicidal or suicidal plan.      ASSESSMENT/PLAN:   MDD (major depressive disorder), recurrent severe, without psychosis ([place]) Recent hospitalization for SI. Hospital record/note/lab reviewed. Intermittent SI, currently none. Counseling done. Also evaluated by Erlinda Hong. Continue current regimen as prescribed by the inpatient Psyc. Suicidal hotline given in her AVS summary. Outpatient f/u with Psych as instructed. In the mean time, I refilled her Zoloft and Abilify as prescribed by Psych.   Office Visit from 04/16/2020 in [facility]  PHQ-9 Total Score  17       DM (diabetes mellitus), type 2, uncontrolled Poor compliance. Out of Lantus and never asked for refill. I refilled both Lantus and Humalog today. Continue home CBG monitoring. F/U in 4 weeks for reassessment.  HYPERTENSION, BENIGN SYSTEMIC Hypotension with Enalapril 5 mg. Will reduce to 2.5  mg. Refill sent to her pharmacy. She will try a lower dose and let me know if she still has concerns with her BP.  Hyperlipidemia Compliant with Lipitor.     Andrena Mews, MD [place]

## 2020-04-16 NOTE — Patient Instructions (Signed)
Suicidal Feelings: How to Help Yourself Suicide is when you end your own life. There are many things you can do to help yourself feel better when struggling with these feelings. Many services and people are available to support you and others who struggle with similar feelings.  If you ever feel like you may hurt yourself or others, or have thoughts about taking your own life, get help right away. To get help:  Call your local emergency services (911 in the U.S.).  The [employer]'s health and human services helpline (211 in the U.S.).  Go to your nearest emergency department.  Call a suicide hotline to speak with a trained counselor. The following suicide hotlines are available in the [place]: ? 1-800-273-TALK (1-800-273-8255). ? 1-800-SUICIDE (1-800-784-2433). ? 1-888-628-9454. This is a hotline for Spanish speakers. ? 1-800-799-4889. This is a hotline for TTY users. ? 1-866-4-U-TREVOR (1-866-488-7386). This is a hotline for lesbian, gay, bisexual, transgender, or questioning youth. ? For a list of hotlines in [place], visit www.suicide.org/hotlines/international/[place]-suicide-hotlines.html  Contact a crisis center or a local suicide prevention center. To find a crisis center or suicide prevention center: ? Call your local hospital, clinic, community service organization, mental health center, social service provider, or health department. Ask for help with connecting to a crisis center. ? For a list of crisis centers in the [place], visit: suicidepreventionlifeline.org ? For a list of crisis centers in [place], visit: suicideprevention.ca How to help yourself feel better   Promise yourself that you will not do anything extreme when you have suicidal feelings. Remember, there is hope. Many people have gotten through suicidal thoughts and feelings, and you can too. If you have had these feelings before, remind yourself that you can get through them again.  Let family, friends,  teachers, or counselors know how you are feeling. Try not to separate yourself from those who care about you and want to help you. Talk with someone every day, even if you do not feel sociable. Face-to-face conversation is best to help them understand your feelings.  Contact a mental health care provider and work with this person regularly.  Make a safety plan that you can follow during a crisis. Include phone numbers of suicide prevention hotlines, mental health professionals, and trusted friends and family members you can call during an emergency. Save these numbers on your phone.  If you are thinking of taking a lot of medicine, give your medicine to someone who can give it to you as prescribed. If you are on antidepressants and are concerned you will overdose, tell your health care provider so that he or she can give you safer medicines.  Try to stick to your routines. Follow a schedule every day. Make self-care a priority.  Make a list of realistic goals, and cross them off when you achieve them. Accomplishments can give you a sense of worth.  Wait until you are feeling better before doing things that you find difficult or unpleasant.  Do things that you have always enjoyed to take your mind off your feelings. Try reading a book, or listening to or playing music. Spending time outside, in nature, may help you feel better. Follow these instructions at home:   Visit your primary health care provider every year for a checkup.  Work with a mental health care provider as needed.  Eat a well-balanced diet, and eat regular meals.  Get plenty of rest.  Exercise if you are able. Just 30 minutes of exercise each day can   help you feel better.  Take over-the-counter and prescription medicines only as told by your health care provider. Ask your mental health care provider about the possible side effects of any medicines you are taking.  Do not use alcohol or drugs, and remove these substances  from your home.  Remove weapons, poisons, knives, and other deadly items from your home. General recommendations  Keep your living space well lit.  When you are feeling well, write yourself a letter with tips and support that you can read when you are not feeling well.  Remember that life's difficulties can be sorted out with help. Conditions can be treated, and you can learn behaviors and ways of thinking that will help you. Where to find more information  National Suicide Prevention Lifeline: www.suicidepreventionlifeline.org  Hopeline: www.hopeline.com  American Foundation for Suicide Prevention: www.afsp.org  The Trevor Project (for lesbian, gay, bisexual, transgender, or questioning youth): www.thetrevorproject.org Contact a health care provider if:  You feel as though you are a burden to others.  You feel agitated, angry, vengeful, or have extreme mood swings.  You have withdrawn from family and friends. Get help right away if:  You are talking about suicide or wishing to die.  You start making plans for how to commit suicide.  You feel that you have no reason to live.  You start making plans for putting your affairs in order, saying goodbye, or giving your possessions away.  You feel guilt, shame, or unbearable pain, and it seems like there is no way out.  You are frequently using drugs or alcohol.  You are engaging in risky behaviors that could lead to death. If you have any of these symptoms, get help right away. Call emergency services, go to your nearest emergency department or crisis center, or call a suicide crisis helpline. Summary  Suicide is when you take your own life.  Promise yourself that you will not do anything extreme when you have suicidal feelings.  Let family, friends, teachers, or counselors know how you are feeling.  Get help right away if you feel as though life is getting too tough to handle and you are thinking about suicide. This  information is not intended to replace advice given to you by your health care provider. Make sure you discuss any questions you have with your health care provider. Document Revised: 02/20/2019 Document Reviewed: 06/12/2017 Elsevier Patient Education  2020 Elsevier Inc.  

## 2020-04-19 ENCOUNTER — Telehealth: Payer: Self-pay | Admitting: Family Medicine

## 2020-04-19 DIAGNOSIS — F333 Major depressive disorder, recurrent, severe with psychotic symptoms: Secondary | ICD-10-CM

## 2020-04-19 NOTE — Telephone Encounter (Signed)
  I again called her with no response. Whenever she returns my call, please confirm if she is scheduled to see a Psychiatrist at [facility] resources or a Psychologist?  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  Please give her the number to 2-4 choices of the list below. Thanks.   Psychiatry Resource List (Adults and Children) Most of these providers will take Medicaid. please consult your insurance for a complete and updated list of available providers. When calling to make an appointment have your insurance information available to confirm you are covered.  [place]:   [place]: [address]     [phone]   Linna Hoff: [place],        [phone] [place]: [place],    [place]: [place],                   [phone] Children: [place] [address]         [phone]  [place]  (Psychiatry & counseling ; adults & children ; will take Medicaid) [place], [place], [place]        [phone]   [place]  (Psychiatry only; Adults /children 12 and over, will take Medicaid)  [place], [place], [place] [address]       [phone]   [place] (Psychiatry & counseling ; adults & children ; will take Medicaid [address]  [place] [address]   Go on-line to complete referral ( [email or web address] [phone]    (Spanish therapist)  [facility]  Psychiatry & counseling; Adults and children;  Call Registration prior to scheduling an appointment [phone] [address]    [place], [place] [address]    [phone]  [facility] (Psychiatry & counseling; adults & children; Medicare no Medicaid)  [place], [place]  [address]      [phone]    [facility] (up to age 32)  Psychiatry & counseling ,will take Medicaid, must do counseling to receive psychiatry services  [address] [place] [address]        ([place] (Psychiatry & counseling; adults & children; will take Medicaid) Will need a referral from provider [address],  [place], [place]  [phone]  [facility]---  Walk-in Mon-Fri, 8:30-5:00 (will take Medicaid)  [address], [place], [place]  [phone]  To schedule an appointment call [phone]  [facility] --- Walk-In Mon-Friday 8am-3pm ( will take Medicaid, Psychiatry, Adults & children,  [address], [place], [place]   [phone]   [facility] [place]--, Walk-in M-F 8am-12pm and 1pm -3pm   (Counseling, Psychiatry, will take Medicaid, adults & children)  [address], [place], [place]  [phone]

## 2020-04-19 NOTE — Telephone Encounter (Signed)
HIPAA compliant callback message let. Unable to reach her.  I called to check on the patient and to follow-up on Psych referral. Per [facility] director, it seems, [facility] do not provider Psychiatrist care, but counseling.  I need her to establish Psych care.  I will attempt to reach her again and give her list of Psychs in the area.  Although, Epic psych referral is hit or miss, I placed a referral anyway pending f/u with her.

## 2020-04-19 NOTE — Telephone Encounter (Signed)
Patient calls nurse line returning phone call from Dr. Gwendlyn Deutscher. Patient is following up with Dr. Hartford Poli on Thursday, 6/11 at 2pm and is scheduled with family resources on 05/27/2020.   Talbot Grumbling, RN

## 2020-04-23 ENCOUNTER — Ambulatory Visit (INDEPENDENT_AMBULATORY_CARE_PROVIDER_SITE_OTHER): Payer: Medicaid Other | Admitting: Psychology

## 2020-04-23 ENCOUNTER — Other Ambulatory Visit: Payer: Self-pay

## 2020-04-23 DIAGNOSIS — F4321 Adjustment disorder with depressed mood: Secondary | ICD-10-CM

## 2020-04-23 NOTE — Patient Instructions (Addendum)
[facility] [phone]- grief counseling  [place], [place] [email or web address]      Psychiatry Resource List (Adults and Children) Most of these providers will take Medicaid. please consult your insurance for a complete and updated list of available providers. When calling to make an appointment have your insurance information available to confirm you are covered.  [place]:   [place]: [address]     [phone]   Linna Hoff: [place],        [phone] [place]: [place],    [place]: [place],                   [phone] Children: [place] [address]         [phone]  [facility]  (Psychiatry & counseling ; adults & children ; will take Medicaid) [place], [place], [place]        [phone]   [place]  (Psychiatry only; Adults /children 12 and over, will take Medicaid)  [place], [place], [place] [address]       [phone]   [place] (Psychiatry & counseling ; adults & children ; will take Medicaid [address]  [place] [address]   Go on-line to complete referral ( [email or web address] [phone]    (Spanish therapist)  [facility]  Psychiatry & counseling; Adults and children;  Call Registration prior to scheduling an appointment [phone] [place]    [place], [place] [address]    [phone]  [facility] (Psychiatry & counseling; adults & children; Medicare no Medicaid)  [place],   [address]      [phone]    [facility] (up to age 63)  Psychiatry & counseling ,will take Medicaid, must do counseling to receive psychiatry  services  [address] [place] [address]        ([place] (Psychiatry & counseling; adults & children; will take Medicaid) Will need a referral from provider [address],  [place], [place]  [phone]  [facility]---  Walk-in Mon-Fri, 8:30-5:00 (will take Medicaid)  [address] [place], [place]  [phone]  To schedule an appointment call [phone]  [facility] --- Walk-In Mon-Friday 8am-3pm ( will take Medicaid, Psychiatry, Adults & children,  [address], [place], [place]   [phone]   [facility] [place]--, Walk-in M-F 8am-12pm and 1pm -3pm   (Counseling, Psychiatry, will take Medicaid, adults & children)  [address], [place], [place]  [phone]

## 2020-04-23 NOTE — BH Specialist Note (Signed)
Integrated Behavioral Health Visit   04/23/2020 Tanya Nolan [id number]   Session Start time: 2  Session End time: 230 Total time: 23  Referring Provider: Dr. Ane Payment   PRESENTING CONCERNS: Patient and/or family reports the following symptoms/concerns: Pt reported she has been having more good days than bad.  Pt shared that she no longer has had thoughts of wanting to hurt herself.  Pt endorsed passive SI at times. No plan no intent.  Pt shared her protective factors include her family.  Pt reported she is still grieving and was tearful at appt.   Pt was given f/u info for grief counseling and support groups.  Pt and Dr.  Hartford Poli scheduled f/u check-in call in one week.  Duration of problem: since husband's death (about 6 months); Severity of problem: moderate  STRENGTHS (Protective Factors/Coping Skills): Love for family and dog  GOALS ADDRESSED: Patient will: 1.  Reduce symptoms of: grief : sadness, guilt, feelings of being overwhelmed 2.  Increase knowledge and/or ability of: self-management skills: engaging in grief counseling and support groups  3.  Demonstrate ability to: Begin healthy grieving over loss  INTERVENTIONS: Interventions utilized:  Supportive Counseling Standardized Assessments completed: PHQ 2  ASSESSMENT: Patient currently experiencing grief due to the loss of her husband.   Patient may benefit from continued medication management, grief counseling  PLAN: 1. Follow up with behavioral health clinician on : f/u on resources 2. Behavioral recommendations: grief counseling from [facility] and support groups  3. Referral(s): Armed forces logistics/support/administrative officer (LME/Outside Clinic)   Erlinda Hong, PhD., LMFT-A

## 2020-04-26 ENCOUNTER — Telehealth: Payer: Self-pay

## 2020-04-26 NOTE — Telephone Encounter (Signed)
Form completed and left in the RN's inbox for further processing.   NB: I called to check on her today. She stated that she is feeling much better on her current dose of Zoloft. She has an upcoming appointment with the [facility] center. She confirmed with me that they do both therapy and medication management.  I went to review their web site and indeed they offer medication management.  She is aware that I prefer that a Psychiatrist supervise and manage her medication given the severity of her symptoms. She will let me know if she has issues with setting up Psychiatrist's management of her meds. F/U as planned.

## 2020-04-26 NOTE — Telephone Encounter (Signed)
After further investigation, PA is not needed.   Since pt is Rx'd 3 tablets daily an override code is needed at the pharmacy.  Spoke with pharmacy, they placed the override code (10) and meds went thru for $3. Christen Bame, CMA

## 2020-04-26 NOTE — Telephone Encounter (Signed)
Received fax from [facility] requesting prior authorization of Sertraline 50mg . Form placed in MD's box for completion.

## 2020-04-28 ENCOUNTER — Telehealth: Payer: Self-pay | Admitting: Psychology

## 2020-04-28 NOTE — Telephone Encounter (Signed)
Called to check-in with pt and left VM

## 2020-04-30 ENCOUNTER — Encounter: Payer: Self-pay | Admitting: Family Medicine

## 2020-04-30 LAB — HM DIABETES EYE EXAM

## 2020-05-06 ENCOUNTER — Other Ambulatory Visit: Payer: Self-pay

## 2020-05-06 ENCOUNTER — Ambulatory Visit (HOSPITAL_COMMUNITY)
Admission: EM | Admit: 2020-05-06 | Discharge: 2020-05-06 | Disposition: A | Discharge status: Home / Self Care | Payer: Medicaid Other

## 2020-05-06 ENCOUNTER — Encounter (HOSPITAL_COMMUNITY): Payer: Self-pay

## 2020-05-06 DIAGNOSIS — S61412A Laceration without foreign body of left hand, initial encounter: Secondary | ICD-10-CM | POA: Diagnosis not present

## 2020-05-06 NOTE — Discharge Instructions (Signed)
Bandage the wound daily.  Wear gloves at work  This will heal nicely

## 2020-05-06 NOTE — ED Provider Notes (Signed)
[place]    CSN: [id number] Arrival date & time: 05/06/20  1627      History   Chief Complaint Chief Complaint  Patient presents with  . Laceration    HPI Tanya Nolan is a 44 y.o. female.   Patient sliced hand with box cutter today while working in yard. Some bleeding. Has cleaned with soap and water. Tetanus up dated in 06/2019.      Past Medical History:  Diagnosis Date  . Anxiety   . BACK STRAIN, LUMBAR 03/26/2009   Qualifier: Diagnosis of  By: Jerline Pain MD, Anderson Malta    . Chalazion 10/01/2007   Qualifier: Diagnosis of  By: Genene Churn MD, Janett Billow    . Chronic back pain    tx with ibuprofen  . Diabetes mellitus   . Hypercholesteremia   . Hyperlipidemia    no meds - tx with diet  . Hypertension   . Hypothyroidism   . Ingrown toenail 12/25/2014  . 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   C&B WNL  NEG PAPS after  . Nipple discharge 10/13/2013  . Non compliance w medication regimen 10/13/2013  . 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 Thyroid dysfunction     Patient Active Problem List   Diagnosis Date Noted  . MDD (major depressive disorder), recurrent severe, without psychosis ([place]) 03/15/2020  . Grief 11/10/2019  . Dermatitis 08/01/2019  . Tobacco abuse 07/15/2019  . Chronic back pain 09/19/2016  . Hemorrhoids 02/02/2012  . Abdominal pain 03/26/2009  . Hypothyroidism 01/10/2007  . DM (diabetes mellitus), type 2, uncontrolled ([place]) 01/10/2007  . POLYCYSTIC OVARY 01/10/2007  . Hyperlipidemia 01/10/2007  . HYPERTENSION, BENIGN SYSTEMIC 01/10/2007    Past Surgical History:  Procedure Laterality Date  . [place] DECOMPRESSION  2008  . HYSTEROSCOPY X 2  2007 / 2012   ENDOMETRIAL POLYPS REMOVED    OB History    Gravida  0   Para      Term      Preterm      AB      Living        SAB      TAB      Ectopic      Multiple      Live Births               Home Medications     Prior to Admission medications   Medication Sig Start Date End Date Taking? Authorizing Provider  Accu-Chek FastClix Lancets MISC Use to check CBG TID 07/22/19   [name], MD  albuterol (PROVENTIL HFA;VENTOLIN HFA) 108 (90 Base) MCG/ACT inhaler Inhale 2 puffs into the lungs every 6 (six) hours as needed for wheezing or shortness of breath. Patient not taking: Reported on 06/20/2019 03/10/16   Leeanne Rio, MD  ARIPiprazole (ABILIFY) 5 MG tablet Take 1 tablet (5 mg total) by mouth daily. For mood control 04/16/20   Andrena Mews T, MD  atorvastatin (LIPITOR) 80 MG tablet Take 1 tablet (80 mg total) by mouth daily. 04/16/20   [name], MD  BD ULTRA-FINE PEN NEEDLES 29G X 12.7MM MISC USE THREE TIMES A DAY BEFORE MEALS 06/10/13   Andrena Mews T, MD  Blood Glucose Monitoring Suppl (ACCU-CHEK GUIDE) w/Device KIT 1 applicator by Does not apply route 3 (three) times daily. Use to check CBG TID 07/22/19   [name], MD  cetirizine (ZYRTEC) 10 MG tablet Take 1 tablet (10 mg  total) by mouth daily. Patient not taking: Reported on 06/20/2019 06/14/16   Tonette Bihari, MD  enalapril (VASOTEC) 2.5 MG tablet Take 1 tablet (2.5 mg total) by mouth daily. For high blood pressure 04/16/20   Andrena Mews T, MD  glucose blood test strip Use to check GBG TID 07/22/19   [name], MD  Insulin Glargine (BASAGLAR KWIKPEN) 100 UNIT/ML Inject 0.15 mLs (15 Units total) into the skin every morning. 04/16/20   [name], MD  insulin lispro (HUMALOG KWIKPEN) 100 UNIT/ML KwikPen Inject 0.05 mLs (5 Units total) into the skin with breakfast, with lunch, and with evening meal. 04/16/20   [name], MD  levothyroxine (SYNTHROID) 125 MCG tablet Take 1 tablet (125 mcg total) by mouth daily before breakfast. Reported on 02/23/2016 04/16/20   [name], MD  polyethylene glycol powder (GLYCOLAX/MIRALAX) 17 GM/SCOOP powder Take 17 g by mouth daily as needed. Patient not taking:  Reported on 08/01/2019 06/20/19   [name], MD  sertraline (ZOLOFT) 50 MG tablet Take 3 tablets (150 mg total) by mouth daily. For depression 04/16/20   [name], MD  traZODone (DESYREL) 50 MG tablet Take 1 tablet (50 mg total) by mouth at bedtime as needed for sleep. 03/25/20   Lindell Spar I, NP  insulin lispro protamine-insulin lispro (HUMALOG 75/25) (75-25) 100 UNIT/ML SUSP Inject 40-50 Units into the skin 2 (two) times daily with a meal. Patient takes 40 units in the morning and 50 units at night.  Verified it is Humalog 75/25.  04/18/12  [provider]    Family History Family History  Problem Relation Age of Onset  . Hypertension Sister   . Lupus Mother   . Hypertension Mother   . Heart disease Father   . Gout Father   . Lupus Father     Social History Social History   Tobacco Use  . Smoking status: Current Every Day Smoker    Packs/day: 0.50    Types: Cigarettes  . Smokeless tobacco: Never Used  Vaping Use  . Vaping Use: Never used  Substance Use Topics  . Alcohol use: Yes    Alcohol/week: 0.0 standard drinks    Comment: socially - wine/liquor--Rare  . Drug use: No    Comment: past use "years ago" per patient     Allergies   Patient has no known allergies.   Review of Systems Review of Systems   Physical Exam Triage Vital Signs ED Triage Vitals  Enc Vitals Group     BP 05/06/20 1655 (!) 143/80     Pulse Rate 05/06/20 1655 70     Resp 05/06/20 1655 14     Temp 05/06/20 1655 98.9 F (37.2 C)     Temp src --      SpO2 05/06/20 1655 100 %     Weight --      Height --      Head Circumference --      Peak Flow --      Pain Score 05/06/20 1654 3     Pain Loc --      Pain Edu? --      Excl. in [place]? --    No data found.  Updated Vital Signs BP (!) 143/80   Pulse 70   Temp 98.9 F (37.2 C)   Resp 14   LMP 04/16/2020   SpO2 100%   Visual Acuity Right Eye Distance:   Left Eye Distance:  Bilateral Distance:    Right Eye  Near:   Left Eye Near:    Bilateral Near:     Physical Exam Vitals and nursing note reviewed.  Constitutional:      Appearance: Normal appearance.  Musculoskeletal:     Comments: Full ROM of left hand.  Skin:    Comments: 0.5cm 72m deep laceration to lateral left hand, proximal to 5th MCP with well approximated wound edges without aid  Neurological:     Mental Status: She is alert.      UC Treatments / Results  Labs (all labs ordered are listed, but only abnormal results are displayed) Labs Reviewed - No data to display  EKG   Radiology No results found.  Procedures Procedures (including critical care time)  Wound cleaned with soap and water. Dermabond applied   Medications Ordered in UC Medications - No data to display  Initial Impression / Assessment and Plan / UC Course  I have reviewed the triage vital signs and the nursing notes.  Pertinent labs & imaging results that were available during my care of the patient were reviewed by me and considered in my medical decision making (see chart for details).     #Hand Laceration Patient is a 44year old with superficial laceration of left hand. Felt wound would heal with simple bandaging, however offered demarbond. Patient agreed to this. Tetanus up to date. Follow up with PCP as need. While low risk of infection, discussed signs of infection. Patient verbalized understanding of plan of care. Final Clinical Impressions(s) / UC Diagnoses   Final diagnoses:  Laceration of left hand without foreign body, initial encounter     Discharge Instructions     Bandage the wound daily.  Wear gloves at work  This will heal nicely      ED Prescriptions    None     PDMP not reviewed this encounter.   DPurnell Shoemaker PA-C 05/06/20 1758

## 2020-05-06 NOTE — ED Notes (Signed)
Pts left hand placed in a solution of warm water and hibiclense.

## 2020-05-06 NOTE — ED Triage Notes (Signed)
Patient reports she cut her left fifth digit with a box cutter.

## 2020-05-10 ENCOUNTER — Encounter: Payer: Self-pay | Admitting: Family Medicine

## 2020-05-11 ENCOUNTER — Encounter: Payer: Self-pay | Admitting: Family Medicine

## 2020-05-11 ENCOUNTER — Other Ambulatory Visit: Payer: Self-pay

## 2020-05-11 ENCOUNTER — Telehealth: Payer: Self-pay

## 2020-05-11 ENCOUNTER — Ambulatory Visit (INDEPENDENT_AMBULATORY_CARE_PROVIDER_SITE_OTHER): Payer: Medicaid Other | Admitting: Family Medicine

## 2020-05-11 VITALS — BP 130/84 | HR 75 | Ht 68.0 in | Wt 226.0 lb

## 2020-05-11 DIAGNOSIS — E1169 Type 2 diabetes mellitus with other specified complication: Secondary | ICD-10-CM | POA: Diagnosis not present

## 2020-05-11 DIAGNOSIS — F332 Major depressive disorder, recurrent severe without psychotic features: Secondary | ICD-10-CM

## 2020-05-11 DIAGNOSIS — E785 Hyperlipidemia, unspecified: Secondary | ICD-10-CM

## 2020-05-11 DIAGNOSIS — E1165 Type 2 diabetes mellitus with hyperglycemia: Secondary | ICD-10-CM

## 2020-05-11 DIAGNOSIS — Z1159 Encounter for screening for other viral diseases: Secondary | ICD-10-CM | POA: Diagnosis not present

## 2020-05-11 MED ORDER — EMPAGLIFLOZIN 10 MG PO TABS: 10.0000 mg | ORAL_TABLET | Freq: Every day | ORAL | 3 refills | Status: DC

## 2020-05-11 NOTE — Progress Notes (Signed)
    SUBJECTIVE:   CHIEF COMPLAINT / HPI:   DM2: Had not used any of her insulin for over one months. She prefers to get back on oral antidiabetic agents rather than injectables. She  Was previously on Metformin, but d/ced due to intolerant to Metform - diarrhea. She d/ced Januvia due to cost and was on Glipizide many years ago. She does not check her CBG at home. She does have glucometer at home.  Depression:She is compliant with her Zoloft. Yet to pick up her Abilify from the pharmacy. She did not follow up with the [facility]. She stated that she was given a number to call [facility] for an appointment. She feels she already made the appointment, but can't remember. She is coping by focusing on work. She currently  Has two jobs. She denies any SI.  PERTINENT  PMH / PSH: PMX reviewed.  OBJECTIVE:   BP 130/84   Pulse 75   Ht 5\' 8"  (1.727 m)   Wt 226 lb (102.5 kg)   LMP 04/16/2020   SpO2 98%   BMI 34.36 kg/m   Physical Exam Vitals and nursing note reviewed.  Cardiovascular:     Rate and Rhythm: Normal rate and regular rhythm.     Heart sounds: Normal heart sounds. No murmur heard.   Pulmonary:     Effort: Pulmonary effort is normal. No respiratory distress.     Breath sounds: Normal breath sounds. No wheezing.  Abdominal:     General: Abdomen is flat. Bowel sounds are normal.     Palpations: Abdomen is soft. There is no mass.     Tenderness: There is no abdominal tenderness.  Neurological:     Mental Status: She is alert.  Psychiatric:        Behavior: Behavior normal.        Thought Content: Thought content normal.        Judgment: Judgment normal.     Comments: Depressed mood      Office Visit from 05/11/2020 in [place]  PHQ-9 Total Score 11       ASSESSMENT/PLAN:   MDD (major depressive disorder), recurrent severe, without psychosis ([place]) Non-adherent to her Abilify. I advised her that she has refills at the pharmacy. She will  call her pharmacy to pick up her medication and let me know if she has any difficulty with that. I think she will benefit from hand holding for compliance safe. I discussed CCM referral with her and she agreed with the plan. Perhaps CCM social worker can help connect her to Psychiatrist as well. Return precaution discussed.  Type 2 diabetes mellitus with hyperlipidemia (HCC) Poor compliance with her Insulin. She said she does not want to get back on Insulin. I discussed starting Jardiance. Medication escribed. Recheck A1C in 3-4 months. Home CBG monitoring discussed. She agreed with the plan.     Tanya Mews, MD [place]

## 2020-05-11 NOTE — Patient Instructions (Signed)
It was nice seeing you today. Let us discontinue your insulin all together. I will start you on Jardiance. Take one tablet daily. Please see me back in 2-3 months.

## 2020-05-11 NOTE — Telephone Encounter (Signed)
Completed PA info in [facility] for [place]. Status pending. Will recheck status in 24 hours.

## 2020-05-11 NOTE — Assessment & Plan Note (Signed)
Poor compliance with her Insulin. She said she does not want to get back on Insulin. I discussed starting Jardiance. Medication escribed. Recheck A1C in 3-4 months. Home CBG monitoring discussed. She agreed with the plan.

## 2020-05-11 NOTE — Assessment & Plan Note (Addendum)
Non-adherent to her Abilify.  I advised her that she has refills at the pharmacy. She will call her pharmacy to pick up her medication and let me know if she has any difficulty with that.  I think she will benefit from hand holding for compliance safe. I discussed CCM referral with her and she agreed with the plan.  Perhaps CCM social worker can help connect her to Psychiatrist as well. Return precaution discussed.

## 2020-05-12 ENCOUNTER — Ambulatory Visit: Payer: Medicaid Other | Admitting: Licensed Clinical Social Worker

## 2020-05-12 ENCOUNTER — Ambulatory Visit: Payer: Self-pay | Admitting: Licensed Clinical Social Worker

## 2020-05-12 DIAGNOSIS — Z139 Encounter for screening, unspecified: Secondary | ICD-10-CM

## 2020-05-12 LAB — HEPATITIS C ANTIBODY: Hep C Virus Ab: <0.1 s/co ratio (ref 0.0–0.9)

## 2020-05-12 NOTE — Chronic Care Management (AMB) (Signed)
    Clinical Social Work  Care Management Outreach   05/12/2020 Name: JAHLIA OMURA MRN: [id number] DOB: [birth year]  KAYTON RIPP is a 44 y.o. year old female who is a primary care patient of [name], MD .  The Care Management team was consulted for assistance with Mental Health Counseling and Resources.  LCSW reached out to [name] today by phone to introduce self, assess needs and offer Care Management services and interventions.   Plan: phone appointment scheduled 05/12/20  Review of patient status, including review of consultants reports, relevant laboratory and other test results, and collaboration with appropriate care team members and the patient's provider was performed as part of comprehensive patient evaluation and provision of care management services.    Casimer Lanius, [place] / [place]   [phone] 11:12 AM

## 2020-05-12 NOTE — Chronic Care Management (AMB) (Signed)
Care Management   Clinical Social Work    05/12/2020 Name: Tanya Nolan MRN: [id number] DOB: 1976-06-11 Referred by: [name], MD  Reason for referral : Care Coordination (mental health)  Tanya Nolan is a 44 y.o. year old female who is a primary care patient of [name], MD.  Reason for follow-up: assess for barriers and progress with connecting for unmet mental health needs .   Assessment: Reports she went to [place] for her initial behavioral health assessment and was referred to [facility] Patient has appointment with [facility] July 12th. And July 21st   Plan:  1. Patient will keep appointments July 12th ,  2.   Will call office if needed no F/U scheduled with LCSW no other needs identified at this time. Advance Directive Status:  not addressed during this encounter.  SDOH (Social Determinants of Health) assessments performed: Yes ;    Goals Addressed            This Visit's Progress   . connect with counseling resources to manage grief   On track    Current Barriers:  . Unmet grief need for patient with HTN and DMII . Patient needs support, education, and care coordination to connect to grief counseling  . Patient met with Dr. Hartford Poli however did not follow up on resources provided . Patient has appointment with [facility] for counseling and psychiatry July 12th and July 21 Clinical Goal(s)  . Over the next 20 days, patient will follow up with counseling resources provided  Interventions :  . Assessed patient's needs, barriers and medication compliance . Provided EMMI educational information on managing stress and relaxation . Reminded patient of July 12th appointment with [facility] Patient Self Care Activities & Deficits:  . Patient is able to keep appointments discussed today . Patient is unable to independently navigate community resource options without support Please see past  updates related to this goal by clicking on the "Past Updates" button in the selected goal       Outpatient Encounter Medications as of 05/12/2020  Medication Sig Note  . Accu-Chek FastClix Lancets MISC Use to check CBG TID   . albuterol (PROVENTIL HFA;VENTOLIN HFA) 108 (90 Base) MCG/ACT inhaler Inhale 2 puffs into the lungs every 6 (six) hours as needed for wheezing or shortness of breath. (Patient not taking: Reported on 06/20/2019) 09/19/2016: Hasn't used in last 2 months  . ARIPiprazole (ABILIFY) 5 MG tablet Take 1 tablet (5 mg total) by mouth daily. For mood control (Patient not taking: Reported on 05/11/2020)   . atorvastatin (LIPITOR) 80 MG tablet Take 1 tablet (80 mg total) by mouth daily.   . BD ULTRA-FINE PEN NEEDLES 29G X 12.7MM MISC USE THREE TIMES A DAY BEFORE MEALS   . Blood Glucose Monitoring Suppl (ACCU-CHEK GUIDE) w/Device KIT 1 applicator by Does not apply route 3 (three) times daily. Use to check CBG TID   . cetirizine (ZYRTEC) 10 MG tablet Take 1 tablet (10 mg total) by mouth daily. (Patient not taking: Reported on 06/20/2019) 09/19/2016: Using for sleep  . empagliflozin (JARDIANCE) 10 MG TABS tablet Take 1 tablet (10 mg total) by mouth daily.   . enalapril (VASOTEC) 2.5 MG tablet Take 1 tablet (2.5 mg total) by mouth daily. For high blood pressure   . glucose blood test strip Use to check GBG TID   . levothyroxine (SYNTHROID) 125 MCG tablet Take 1 tablet (125  mcg total) by mouth daily before breakfast. Reported on 02/23/2016   . polyethylene glycol powder (GLYCOLAX/MIRALAX) 17 GM/SCOOP powder Take 17 g by mouth daily as needed. (Patient not taking: Reported on 08/01/2019)   . sertraline (ZOLOFT) 50 MG tablet Take 3 tablets (150 mg total) by mouth daily. For depression   . traZODone (DESYREL) 50 MG tablet Take 1 tablet (50 mg total) by mouth at bedtime as needed for sleep. (Patient not taking: Reported on 05/11/2020)   . [DISCONTINUED] insulin lispro protamine-insulin lispro (HUMALOG  75/25) (75-25) 100 UNIT/ML SUSP Inject 40-50 Units into the skin 2 (two) times daily with a meal. Patient takes 40 units in the morning and 50 units at night.  Verified it is Humalog 75/25. 11/02/2011: Took 5 units as order by anesthesia at 1115  11/02/2011   No facility-administered encounter medications on file as of 05/12/2020.   Review of patient status, including review of consultants reports, relevant laboratory and other test results, and collaboration with appropriate care team members and the patient's provider was performed as part of comprehensive patient evaluation and provision of care management services.    Casimer Lanius, [place] / [place]   [phone] 4:46 PM

## 2020-05-13 NOTE — Telephone Encounter (Signed)
Prior approval for Jardiance completed via [employer].  Med approved for 05/11/2020 - 05/06/2021. [place] informed.  Christen Bame, CMA

## 2020-05-14 ENCOUNTER — Telehealth: Payer: Self-pay | Admitting: Family Medicine

## 2020-05-14 NOTE — Chronic Care Management (AMB) (Signed)
  Care Management   Note  05/14/2020 Name: Tanya Nolan MRN: [id number] DOB: September 25, 1976  Tanya Nolan is a 44 y.o. year old female who is a primary care patient of Gwendlyn Deutscher, Phill Myron, MD. I reached out to [name] by phone today in response to a referral sent by [name]'s PCP [name], MD.  [name] was given information about care management services today including:  1. Care management services include personalized support from designated clinical staff supervised by her physician, including individualized plan of care and coordination with other care providers 2. 24/7 contact phone numbers for assistance for urgent and routine care needs. 3. The patient may stop care management services at any time by phone call to the office staff.  Patient agreed to services and verbal consent obtained.   Follow up plan: Telephone appointment with care management team member scheduled for: 05/20/2020.  [place], [place] [id number] Direct Dial: [phone] [email or web address]: [email or web address]

## 2020-05-18 ENCOUNTER — Telehealth: Payer: Self-pay

## 2020-05-18 NOTE — Telephone Encounter (Signed)
Received PA for Aripiprazole. Per medicaid, the patient needs to try and fail 1 preferred drug. Please let me know if patient has failed a preferred drug listed below.

## 2020-05-18 NOTE — Telephone Encounter (Signed)
Been on Seroquel in the past prior to starting Abilify. Thanks.

## 2020-05-20 ENCOUNTER — Ambulatory Visit: Payer: Medicaid Other

## 2020-05-20 ENCOUNTER — Other Ambulatory Visit: Payer: Self-pay

## 2020-05-20 NOTE — Telephone Encounter (Signed)
Form completed and was handed over to [employer]. If her Ability gets denied, please reach out to the patient to inform her and encourage her to follow-up with Psych as scheduled as I will prefer they manage her depression with associated Psychosis. Thank you.

## 2020-05-20 NOTE — Telephone Encounter (Signed)
Medicaid is not letting me fill out online. Will place form in providers box to complete. Please return back to me, so I can fax and make a copy for our records.

## 2020-05-20 NOTE — Chronic Care Management (AMB) (Signed)
Care Management   Initial Visit Note  05/20/2020 Name: Tanya Nolan MRN: [id number] DOB: 09-21-1976   Assessment: Tanya Nolan is a 44 y.o. year old female who sees [name], MD for primary care. The care management team was consulted for assistance with care management and care coordination needs related to Disease Management Educational Needs for DM II .   Review of patient status, including review of consultants reports, relevant laboratory and other test results, and collaboration with appropriate care team members and the patient's provider was performed as part of comprehensive patient evaluation and provision of care management services.    SDOH (Social Determinants of Health) assessments performed: No See Care Plan activities for detailed interventions related to [facility])     Outpatient Encounter Medications as of 05/20/2020  Medication Sig Note  . Accu-Chek FastClix Lancets MISC Use to check CBG TID   . albuterol (PROVENTIL HFA;VENTOLIN HFA) 108 (90 Base) MCG/ACT inhaler Inhale 2 puffs into the lungs every 6 (six) hours as needed for wheezing or shortness of breath. 05/20/2020: Occasionally  . ARIPiprazole (ABILIFY) 5 MG tablet Take 1 tablet (5 mg total) by mouth daily. For mood control   . atorvastatin (LIPITOR) 80 MG tablet Take 1 tablet (80 mg total) by mouth daily.   . cetirizine (ZYRTEC) 10 MG tablet Take 1 tablet (10 mg total) by mouth daily. 05/20/2020: Takes when she needs it  . empagliflozin (JARDIANCE) 10 MG TABS tablet Take 1 tablet (10 mg total) by mouth daily.   . enalapril (VASOTEC) 2.5 MG tablet Take 1 tablet (2.5 mg total) by mouth daily. For high blood pressure   . glucose blood test strip Use to check GBG TID   . levothyroxine (SYNTHROID) 125 MCG tablet Take 1 tablet (125 mcg total) by mouth daily before breakfast. Reported on 02/23/2016   . polyethylene glycol powder (GLYCOLAX/MIRALAX) 17 GM/SCOOP powder Take 17 g by mouth daily as needed.   . sertraline  (ZOLOFT) 50 MG tablet Take 3 tablets (150 mg total) by mouth daily. For depression   . traZODone (DESYREL) 50 MG tablet Take 1 tablet (50 mg total) by mouth at bedtime as needed for sleep. 05/20/2020: As needed  . BD ULTRA-FINE PEN NEEDLES 29G X 12.7MM MISC USE THREE TIMES A DAY BEFORE MEALS   . Blood Glucose Monitoring Suppl (ACCU-CHEK GUIDE) w/Device KIT 1 applicator by Does not apply route 3 (three) times daily. Use to check CBG TID   . [DISCONTINUED] insulin lispro protamine-insulin lispro (HUMALOG 75/25) (75-25) 100 UNIT/ML SUSP Inject 40-50 Units into the skin 2 (two) times daily with a meal. Patient takes 40 units in the morning and 50 units at night.  Verified it is Humalog 75/25. 11/02/2011: Took 5 units as order by anesthesia at 1115  11/02/2011   No facility-administered encounter medications on file as of 05/20/2020.    Goals Addressed              This Visit's Progress   .  I don't check my blood sugars. (pt-stated)        CARE PLAN ENTRY (see longtitudinal plan of care for additional care plan information)  Objective:  Lab Results  Component Value Date   HGBA1C 7.6 (H) 03/16/2020   HGBA1C 7.6 (H) 03/16/2020 .   Lab Results  Component Value Date   CREATININE 0.74 03/23/2020   CREATININE 0.86 03/14/2020   CREATININE 0.70 06/20/2019    Current Barriers:  Marland Kitchen Knowledge Deficits related to basic Diabetes  pathophysiology and self care/management . Does not use cbg meter - patient states that she does not like to stick her finger.   Case Manager Clinical Goal(s):  Over the next 90 days, patient will demonstrate improved adherence to prescribed treatment plan for diabetes self care/management as evidenced by: learning to check her blood sugars daily and lowering her a1c by 1-2 points  Interventions:  . Provided education to patient about basic DM disease process . Reviewed medications with patient and discussed importance of medication adherence . Discussed plans with  patient for ongoing care management follow up and provided patient with direct contact information for care management team . Provided patient with verbal educational materials related to hypo and hyperglycemia and importance of correct treatment . Advised patient, providing education and rationale, to check cbg daily and record, calling the office  for findings outside established parameters.   . Review of patient status, including review of consultants reports, relevant laboratory and other test results, and medications completed. . Patient states that she does not like to stick her finger and does not check her blood sugars.  She states that when she was seeing an endocrinologist they dicussed the [facility] meter and she stated that she would like to try that.  I told her I don't know if she would qualify but will mention it to doctor Eniola . Will send the patient a Diabetes Packet.  Patient Self Care Activities:  . UNABLE to independently self manage diabetes . Attends all scheduled provider appointments  Initial goal documentation         Follow up plan:  The care management team will reach out to the patient again over the next 14 days.  The patient has been provided with contact information for the care management team and has been advised to call with any health related questions or concerns.   Tanya Nolan was given information about Care Management services today including:  1. Care Management services include personalized support from designated clinical staff supervised by a physician, including individualized plan of care and coordination with other care providers 2. 24/7 contact phone numbers for assistance for urgent and routine care needs. 3. The patient may stop Care Management services at any time (effective at the end of the month) by phone call to the office staff.  Patient agreed to services and verbal consent obtained.  Tanya Arms RN, BSN, [facility] Care Management  Coordinator [place] Phone: [phone] Fax: [phone]

## 2020-05-20 NOTE — Patient Instructions (Signed)
Visit Information  Goals Addressed              This Visit's Progress   .  I don't check my blood sugars. (pt-stated)        CARE PLAN ENTRY (see longtitudinal plan of care for additional care plan information)  Objective:  Lab Results  Component Value Date   HGBA1C 7.6 (H) 03/16/2020   HGBA1C 7.6 (H) 03/16/2020 .   Lab Results  Component Value Date   CREATININE 0.74 03/23/2020   CREATININE 0.86 03/14/2020   CREATININE 0.70 06/20/2019    Current Barriers:  Marland Kitchen Knowledge Deficits related to basic Diabetes pathophysiology and self care/management . Does not use cbg meter - patient states that she does not like to stick her finger.   Case Manager Clinical Goal(s):  Over the next 90 days, patient will demonstrate improved adherence to prescribed treatment plan for diabetes self care/management as evidenced by: learning to check her blood sugars daily and lowering her a1c by 1-2 points  Interventions:  . Provided education to patient about basic DM disease process . Reviewed medications with patient and discussed importance of medication adherence . Discussed plans with patient for ongoing care management follow up and provided patient with direct contact information for care management team . Provided patient with verbal educational materials related to hypo and hyperglycemia and importance of correct treatment . Advised patient, providing education and rationale, to check cbg daily and record, calling the office  for findings outside established parameters.   . Review of patient status, including review of consultants reports, relevant laboratory and other test results, and medications completed. . Patient states that she does not like to stick her finger and does not check her blood sugars.  She states that when she was seeing an endocrinologist they dicussed the [facility] meter and she stated that she would like to try that.  I told her I don't know if she would qualify but will  mention it to doctor Eniola . Will send the patient a Diabetes Packet.  Patient Self Care Activities:  . UNABLE to independently self manage diabetes . Attends all scheduled provider appointments  Initial goal documentation        Tanya Nolan was given information about Care Management services today including:  1. Care Management services include personalized support from designated clinical staff supervised by her physician, including individualized plan of care and coordination with other care providers 2. 24/7 contact phone numbers for assistance for urgent and routine care needs. 3. The patient may stop CCM services at any time (effective at the end of the month) by phone call to the office staff.  Patient agreed to services and verbal consent obtained.   The patient verbalized understanding of instructions provided today and declined a print copy of patient instruction materials.   The care management team will reach out to the patient again over the next 14 days.  The patient has been provided with contact information for the care management team and has been advised to call with any health related questions or concerns.   Lazaro Arms RN, BSN, [facility] Care Management Coordinator [place] Phone: [phone] Fax: [phone]

## 2020-05-24 ENCOUNTER — Other Ambulatory Visit: Payer: Self-pay

## 2020-05-24 ENCOUNTER — Ambulatory Visit (HOSPITAL_COMMUNITY): Payer: Medicaid Other | Admitting: Licensed Clinical Social Worker

## 2020-05-28 NOTE — Telephone Encounter (Signed)
Medication approved per pharmacy. No need for PA. Patient is able to pick up medication for 3 dollars. Patient has been updated.

## 2020-06-02 ENCOUNTER — Other Ambulatory Visit: Payer: Self-pay

## 2020-06-02 ENCOUNTER — Telehealth (HOSPITAL_COMMUNITY): Payer: Medicaid Other | Admitting: Psychiatric/Mental Health

## 2020-06-03 ENCOUNTER — Ambulatory Visit: Payer: Medicaid Other

## 2020-06-03 ENCOUNTER — Other Ambulatory Visit: Payer: Self-pay

## 2020-06-03 NOTE — Chronic Care Management (AMB) (Signed)
  Care Management   Outreach Note  06/03/2020 Name: Tanya Nolan MRN: [id number] DOB: 12/05/1975  Referred by: [name], MD Reason for referral : Chronic Care Management ( Type II)   An unsuccessful telephone outreach was attempted today. The patient was referred to the case management team for assistance with care management and care coordination.   Follow Up Plan: A HIPPA compliant phone message was left for the patient providing contact information and requesting a return call.  The care management team will reach out to the patient again over the next 7-14 days.   Lazaro Arms RN, BSN, [facility] Care Management Coordinator [place] Phone: [phone] Fax: [phone]

## 2020-06-04 ENCOUNTER — Ambulatory Visit: Payer: Medicaid Other

## 2020-06-04 ENCOUNTER — Other Ambulatory Visit: Payer: Self-pay

## 2020-06-04 NOTE — Patient Instructions (Signed)
Visit Information  Goals Addressed              This Visit's Progress   .  I don't check my blood sugars. (pt-stated)        CARE PLAN ENTRY (see longtitudinal plan of care for additional care plan information)  Objective:  Lab Results  Component Value Date   HGBA1C 7.6 (H) 03/16/2020   HGBA1C 7.6 (H) 03/16/2020 .   Lab Results  Component Value Date   CREATININE 0.74 03/23/2020   CREATININE 0.86 03/14/2020   CREATININE 0.70 06/20/2019    Current Barriers:  Marland Kitchen Knowledge Deficits related to basic Diabetes pathophysiology and self care/management . Does not use cbg meter - patient states that she does not like to stick her finger.   Case Manager Clinical Goal(s):  Over the next 90 days, patient will demonstrate improved adherence to prescribed treatment plan for diabetes self care/management as evidenced by: learning to check her blood sugars daily and lowering her a1c by 1-2 points  Interventions:  . Provided education to patient about basic DM disease process . Reviewed medications with patient and discussed importance of medication adherence . Discussed plans with patient for ongoing care management follow up and provided patient with direct contact information for care management team . Provided patient with verbal educational materials related to hypo and hyperglycemia and importance of correct treatment . Advised patient, providing education and rationale, to check cbg daily and record, calling the office  for findings outside established parameters.   . Review of patient status, including review of consultants reports, relevant laboratory and other test results, and medications completed. . Patient states that she does not like to stick her finger and does not check her blood sugars.  She states that when she was seeing an endocrinologist they dicussed the [facility] meter and she stated that she would like to try that.  I told her I don't know if she would qualify but will  mention it to doctor Eniola . Will send the patient a Diabetes Packet. . 06/04/20 . Spoke with the patient today and she states that she is doing well . She is still not checking her blood sugars and she thinks she know where her glucometer is .  RNCM Discussed the importance of checking her blood sugars.  Asked her to find her glucometer and check her blood sugar at least once daily. . Discussed diet and portion sizes. Asked the patient to start drinking at least 12 oz of water daily and then work her way up .  She stated that she would.                                                                                                                              Patient Self Care Activities:  . UNABLE to independently self manage diabetes . Attends all scheduled provider appointments  Please see past updates related to this goal by clicking on the "  Past Updates" button in the selected goal         Tanya Nolan was given information about Care Management services today including:  1. Care Management services include personalized support from designated clinical staff supervised by her physician, including individualized plan of care and coordination with other care providers 2. 24/7 contact phone numbers for assistance for urgent and routine care needs. 3. The patient may stop CCM services at any time (effective at the end of the month) by phone call to the office staff.  Patient agreed to services and verbal consent obtained.   The patient verbalized understanding of instructions provided today and declined a print copy of patient instruction materials.   The care management team will reach out to the patient again over the next 14 days.   Lazaro Arms RN, BSN, [facility] Care Management Coordinator [place] Phone: [phone] Fax: [phone]

## 2020-06-04 NOTE — Chronic Care Management (AMB) (Signed)
Care Management   Follow Up Note   06/04/2020 Name: Tanya Nolan MRN: [id number] DOB: 1976-02-22  Referred by: [name], MD Reason for referral : Chronic Care Management (DM II)   Tanya Nolan is a 44 y.o. year old female who is a primary care patient of [name], MD. The care management team was consulted for assistance with care management and care coordination needs.    Review of patient status, including review of consultants reports, relevant laboratory and other test results, and collaboration with appropriate care team members and the patient's provider was performed as part of comprehensive patient evaluation and provision of chronic care management services.    SDOH (Social Determinants of Health) assessments performed: No See Care Plan activities for detailed interventions related to [facility])     Advanced Directives: See Care Plan and Vynca application for related entries.   Goals Addressed              This Visit's Progress   .  I don't check my blood sugars. (pt-stated)        CARE PLAN ENTRY (see longtitudinal plan of care for additional care plan information)  Objective:  Lab Results  Component Value Date   HGBA1C 7.6 (H) 03/16/2020   HGBA1C 7.6 (H) 03/16/2020 .   Lab Results  Component Value Date   CREATININE 0.74 03/23/2020   CREATININE 0.86 03/14/2020   CREATININE 0.70 06/20/2019    Current Barriers:  Marland Kitchen Knowledge Deficits related to basic Diabetes pathophysiology and self care/management . Does not use cbg meter - patient states that she does not like to stick her finger.   Case Manager Clinical Goal(s):  Over the next 90 days, patient will demonstrate improved adherence to prescribed treatment plan for diabetes self care/management as evidenced by: learning to check her blood sugars daily and lowering her a1c by 1-2 points  Interventions:  . Provided education to patient about basic DM disease process . Reviewed medications  with patient and discussed importance of medication adherence . Discussed plans with patient for ongoing care management follow up and provided patient with direct contact information for care management team . Provided patient with verbal educational materials related to hypo and hyperglycemia and importance of correct treatment . Advised patient, providing education and rationale, to check cbg daily and record, calling the office  for findings outside established parameters.   . Review of patient status, including review of consultants reports, relevant laboratory and other test results, and medications completed. . Patient states that she does not like to stick her finger and does not check her blood sugars.  She states that when she was seeing an endocrinologist they dicussed the [facility] meter and she stated that she would like to try that.  I told her I don't know if she would qualify but will mention it to doctor Eniola . Will send the patient a Diabetes Packet. . 06/04/20 . Spoke with the patient today and she states that she is doing well . She is still not checking her blood sugars and she thinks she know where her glucometer is .  RNCM Discussed the importance of checking her blood sugars.  Asked her to find her glucometer and check her blood sugar at least once daily. . Discussed diet and portion sizes. Asked the patient to start drinking at least 12 oz of water daily and then work her way up .  She stated that she would.  Patient Self Care Activities:  . UNABLE to independently self manage diabetes . Attends all scheduled provider appointments  Please see past updates related to this goal by clicking on the "Past Updates" button in the selected goal          The care management team will reach out to the patient again over the next 14 days.   Lazaro Arms RN,  BSN, [facility] Care Management Coordinator [place] Phone: [phone] Fax: [phone]

## 2020-06-18 ENCOUNTER — Ambulatory Visit: Payer: Medicaid Other

## 2020-06-18 ENCOUNTER — Other Ambulatory Visit: Payer: Self-pay

## 2020-06-18 NOTE — Patient Instructions (Addendum)
Visit Information  Goals Addressed              This Visit's Progress   .  I don't check my blood sugars. (pt-stated)        CARE PLAN ENTRY (see longtitudinal plan of care for additional care plan information)  Objective:  Lab Results  Component Value Date   HGBA1C 7.6 (H) 03/16/2020   HGBA1C 7.6 (H) 03/16/2020 .   Lab Results  Component Value Date   CREATININE 0.74 03/23/2020   CREATININE 0.86 03/14/2020   CREATININE 0.70 06/20/2019    Current Barriers:  Marland Kitchen Knowledge Deficits related to basic Diabetes pathophysiology and self care/management . Does not use cbg meter - patient states that she does not like to stick her finger.   Case Manager Clinical Goal(s):  Over the next 90 days, patient will demonstrate improved adherence to prescribed treatment plan for diabetes self care/management as evidenced by: learning to check her blood sugars daily and lowering her a1c by 1-2 points  Interventions:  . Provided education to patient about basic DM disease process . Reviewed medications with patient and discussed importance of medication adherence . Discussed plans with patient for ongoing care management follow up and provided patient with direct contact information for care management team . Provided patient with verbal educational materials related to hypo and hyperglycemia and importance of correct treatment . Advised patient, providing education and rationale, to check cbg daily and record, calling the office  for findings outside established parameters.   . Review of patient status, including review of consultants reports, relevant laboratory and other test results, and medications completed. . Patient states that she does not like to stick her finger and does not check her blood sugars.  She states that when she was seeing an endocrinologist they dicussed the [facility] meter and she stated that she would like to try that.  I told her I don't know if she would qualify but will  mention it to doctor Eniola . Will send the patient a Diabetes Packet. . 06/18/20 . Spoke with the patient today and she states that she is feeling well she is just mentally drained . She has not checked her blood sugars because she does not have any test strips.  RNCM called the pharmacy and the patient has refills waiting.  RNCM had them to refill the strips.  Called the patient back and let her know that the refills will be waiting for her to pick up when she is ready.                                                                                                                             Patient Self Care Activities:  . UNABLE to independently self manage diabetes . Attends all scheduled provider appointments  Please see past updates related to this goal by clicking on the "Past Updates" button in the selected goal  Ms. Recht was given information about Care Management services today including:  1. Care Management services include personalized support from designated clinical staff supervised by her physician, including individualized plan of care and coordination with other care providers 2. 24/7 contact phone numbers for assistance for urgent and routine care needs. 3. The patient may stop CCM services at any time (effective at the end of the month) by phone call to the office staff.  Patient agreed to services and verbal consent obtained.   The patient verbalized understanding of instructions provided today and declined a print copy of patient instruction materials.   The care management team will reach out to the patient again over the next 21 days.   Lazaro Arms RN, BSN, [facility] Care Management Coordinator [place] Phone: [phone] Fax: [phone]

## 2020-06-18 NOTE — Chronic Care Management (AMB) (Signed)
Care Management   Follow Up Note   06/18/2020 Name: Tanya Nolan MRN: [id number] DOB: 09-07-1976  Referred by: [name], MD Reason for referral : Chronic Care Management (Diabetes)   Tanya Nolan is a 44 y.o. year old female who is a primary care patient of [name], MD. The care management team was consulted for assistance with care management and care coordination needs.    Review of patient status, including review of consultants reports, relevant laboratory and other test results, and collaboration with appropriate care team members and the patient's provider was performed as part of comprehensive patient evaluation and provision of chronic care management services.    SDOH (Social Determinants of Health) assessments performed: No See Care Plan activities for detailed interventions related to [facility])     Advanced Directives: See Care Plan and Vynca application for related entries.   Goals Addressed              This Visit's Progress   .  I don't check my blood sugars. (pt-stated)        CARE PLAN ENTRY (see longtitudinal plan of care for additional care plan information)  Objective:  Lab Results  Component Value Date   HGBA1C 7.6 (H) 03/16/2020   HGBA1C 7.6 (H) 03/16/2020 .   Lab Results  Component Value Date   CREATININE 0.74 03/23/2020   CREATININE 0.86 03/14/2020   CREATININE 0.70 06/20/2019    Current Barriers:  Marland Kitchen Knowledge Deficits related to basic Diabetes pathophysiology and self care/management . Does not use cbg meter - patient states that she does not like to stick her finger.   Case Manager Clinical Goal(s):  Over the next 90 days, patient will demonstrate improved adherence to prescribed treatment plan for diabetes self care/management as evidenced by: learning to check her blood sugars daily and lowering her a1c by 1-2 points  Interventions:  . Provided education to patient about basic DM disease process . Reviewed medications  with patient and discussed importance of medication adherence . Discussed plans with patient for ongoing care management follow up and provided patient with direct contact information for care management team . Provided patient with verbal educational materials related to hypo and hyperglycemia and importance of correct treatment . Advised patient, providing education and rationale, to check cbg daily and record, calling the office  for findings outside established parameters.   . Review of patient status, including review of consultants reports, relevant laboratory and other test results, and medications completed. . Patient states that she does not like to stick her finger and does not check her blood sugars.  She states that when she was seeing an endocrinologist they dicussed the [facility] meter and she stated that she would like to try that.  I told her I don't know if she would qualify but will mention it to doctor Eniola . Will send the patient a Diabetes Packet. . 06/18/20 . Spoke with the patient today and she states that she is feeling well she is just mentally drained . She has not checked her blood sugars because she does not have any test strips.  RNCM called the pharmacy and the patient has refills waiting.  RNCM had them to refill the strips.  Called the patient back and let her know that the refills will be waiting for her to pick up when she is ready.  Patient Self Care Activities:  . UNABLE to independently self manage diabetes . Attends all scheduled provider appointments  Please see past updates related to this goal by clicking on the "Past Updates" button in the selected goal          The care management team will reach out to the patient again over the next 21 days.   Lazaro Arms RN, BSN, [facility] Care Management Coordinator [place] Phone: [phone] Fax: [phone]

## 2020-07-09 ENCOUNTER — Other Ambulatory Visit: Payer: Self-pay

## 2020-07-09 ENCOUNTER — Ambulatory Visit: Payer: Medicaid Other

## 2020-07-09 NOTE — Chronic Care Management (AMB) (Signed)
Care Management   Follow Up Note   07/09/2020 Name: Tanya Nolan MRN: [id number] DOB: 29-Oct-1976  Referred by: [name], MD Reason for referral : Chronic Care Management (DM II)   Tanya Nolan is a 44 y.o. year old female who is a primary care patient of [name], MD. The care management team was consulted for assistance with care management and care coordination needs.    Review of patient status, including review of consultants reports, relevant laboratory and other test results, and collaboration with appropriate care team members and the patient's provider was performed as part of comprehensive patient evaluation and provision of chronic care management services.    SDOH (Social Determinants of Health) assessments performed: No See Care Plan activities for detailed interventions related to [facility])     Advanced Directives: See Care Plan and Vynca application for related entries.   Goals Addressed              This Visit's Progress   .  I don't check my blood sugars. (pt-stated)        CARE PLAN ENTRY (see longtitudinal plan of care for additional care plan information)  Objective:  Lab Results  Component Value Date   HGBA1C 7.6 (H) 03/16/2020   HGBA1C 7.6 (H) 03/16/2020 .   Lab Results  Component Value Date   CREATININE 0.74 03/23/2020   CREATININE 0.86 03/14/2020   CREATININE 0.70 06/20/2019    Current Barriers:  Marland Kitchen Knowledge Deficits related to basic Diabetes pathophysiology and self care/management . Does not use cbg meter - patient states that she does not like to stick her finger.   Case Manager Clinical Goal(s):  Over the next 90 days, patient will demonstrate improved adherence to prescribed treatment plan for diabetes self care/management as evidenced by: learning to check her blood sugars daily and lowering her a1c by 1-2 points  Interventions:  . Provided education to patient about basic DM disease process . Reviewed medications  with patient and discussed importance of medication adherence . Discussed plans with patient for ongoing care management follow up and provided patient with direct contact information for care management team . Provided patient with verbal educational materials related to hypo and hyperglycemia and importance of correct treatment . Advised patient, providing education and rationale, to check cbg daily and record, calling the office  for findings outside established parameters.   . Review of patient status, including review of consultants reports, relevant laboratory and other test results, and medications completed. . Patient states that she does not like to stick her finger and does not check her blood sugars.  She states that when she was seeing an endocrinologist they dicussed the [facility] meter and she stated that she would like to try that.  I told her I don't know if she would qualify but will mention it to doctor Eniola . Will send the patient a Diabetes Packet. . 07/09/20 . The patient states that she is trying to watch what she is eating and monitor her carbs.  She is drinking some water but admits to drinking juices more.  Advised her she is doing good.  It will be a slow process/  She needs to continue to drink water. Monitor her carbs. . She states that her blood sugar was 160 yesterday she had not checked it today.  She thinks that she will have her a1c checked next month. . She received the packet that I sent and feels that it has  some good information in ti. . She wants to stay on a 2 week schedule.                                                                                                                             Patient Self Care Activities:  . UNABLE to independently self manage diabetes . Attends all scheduled provider appointments  Please see past updates related to this goal by clicking on the "Past Updates" button in the selected goal          The care management  team will reach out to the patient again over the next 14 days.   Lazaro Arms RN, BSN, [facility] Care Management Coordinator [place] Phone: [phone] Fax: [phone]

## 2020-07-09 NOTE — Patient Instructions (Signed)
Visit Information  Goals Addressed              This Visit's Progress   .  I don't check my blood sugars. (pt-stated)        CARE PLAN ENTRY (see longtitudinal plan of care for additional care plan information)  Objective:  Lab Results  Component Value Date   HGBA1C 7.6 (H) 03/16/2020   HGBA1C 7.6 (H) 03/16/2020 .   Lab Results  Component Value Date   CREATININE 0.74 03/23/2020   CREATININE 0.86 03/14/2020   CREATININE 0.70 06/20/2019    Current Barriers:  Marland Kitchen Knowledge Deficits related to basic Diabetes pathophysiology and self care/management . Does not use cbg meter - patient states that she does not like to stick her finger.   Case Manager Clinical Goal(s):  Over the next 90 days, patient will demonstrate improved adherence to prescribed treatment plan for diabetes self care/management as evidenced by: learning to check her blood sugars daily and lowering her a1c by 1-2 points  Interventions:  . Provided education to patient about basic DM disease process . Reviewed medications with patient and discussed importance of medication adherence . Discussed plans with patient for ongoing care management follow up and provided patient with direct contact information for care management team . Provided patient with verbal educational materials related to hypo and hyperglycemia and importance of correct treatment . Advised patient, providing education and rationale, to check cbg daily and record, calling the office  for findings outside established parameters.   . Review of patient status, including review of consultants reports, relevant laboratory and other test results, and medications completed. . Patient states that she does not like to stick her finger and does not check her blood sugars.  She states that when she was seeing an endocrinologist they dicussed the [facility] meter and she stated that she would like to try that.  I told her I don't know if she would qualify but will  mention it to doctor Eniola . Will send the patient a Diabetes Packet. . 07/09/20 . The patient states that she is trying to watch what she is eating and monitor her carbs.  She is drinking some water but admits to drinking juices more.  Advised her she is doing good.  It will be a slow process/  She needs to continue to drink water. Monitor her carbs. . She states that her blood sugar was 160 yesterday she had not checked it today.  She thinks that she will have her a1c checked next month. . She received the packet that I sent and feels that it has some good information in ti. . She wants to stay on a 2 week schedule.                                                                                                                             Patient Self Care Activities:  . UNABLE to independently self manage diabetes .  Attends all scheduled provider appointments  Please see past updates related to this goal by clicking on the "Past Updates" button in the selected goal         Tanya Nolan was given information about Care Management services today including:  1. Care Management services include personalized support from designated clinical staff supervised by her physician, including individualized plan of care and coordination with other care providers 2. 24/7 contact phone numbers for assistance for urgent and routine care needs. 3. The patient may stop CCM services at any time (effective at the end of the month) by phone call to the office staff.  Patient agreed to services and verbal consent obtained.   The patient verbalized understanding of instructions provided today and declined a print copy of patient instruction materials.   The care management team will reach out to the patient again over the next 14 days.   Lazaro Arms RN, BSN, [facility] Care Management Coordinator [place] Phone: [phone] Fax: [phone]

## 2020-07-13 ENCOUNTER — Telehealth (HOSPITAL_COMMUNITY): Payer: Medicaid Other

## 2020-07-22 ENCOUNTER — Other Ambulatory Visit: Payer: Self-pay | Admitting: Family Medicine

## 2020-07-26 ENCOUNTER — Telehealth: Payer: Medicaid Other

## 2020-07-27 ENCOUNTER — Telehealth: Payer: Self-pay

## 2020-07-27 NOTE — Telephone Encounter (Signed)
°  Care Management   Outreach Note  07/27/2020 Name: Tanya Nolan MRN: [id number] DOB: 1975/11/14  Referred by: [name], MD Reason for referral : Appointment (DM II)   An unsuccessful telephone outreach was attempted today. The patient was referred to the case management team for assistance with care management and care coordination.   Follow Up Plan: A HIPAA compliant phone message was left for the patient providing contact information and requesting a return call.  The care management team will reach out to the patient again over the next 7-14 days.   Lazaro Arms RN, BSN, [facility] Care Management Coordinator [place] Phone: [phone] Fax: [phone]

## 2020-07-28 NOTE — Telephone Encounter (Signed)
Spoke to patient rescheduled for FU call with RN CM on 9/28

## 2020-08-10 ENCOUNTER — Telehealth: Payer: Self-pay

## 2020-08-10 ENCOUNTER — Telehealth: Payer: Medicaid Other

## 2020-08-10 NOTE — Telephone Encounter (Signed)
  Care Management   Outreach Note  08/10/2020 Name: Tanya Nolan MRN: [id number] DOB: Jun 26, 1976  Referred by: [name], MD Reason for referral : Appointment (DM II)   A second unsuccessful telephone outreach was attempted today. The patient was referred to the case management team for assistance with care management and care coordination.   Follow Up Plan: A HIPAA compliant phone message was left for the patient providing contact information and requesting a return call.  The care management team will reach out to the patient again over the next 7-14 days.   Lazaro Arms RN, BSN, [facility] Care Management Coordinator [place] Phone: [phone] Fax: [phone]

## 2020-08-12 NOTE — Telephone Encounter (Signed)
Tanya Nolan   Spoke to patient she is rescheduled and aware of new appt time. 08/23/2020

## 2020-08-13 ENCOUNTER — Telehealth (INDEPENDENT_AMBULATORY_CARE_PROVIDER_SITE_OTHER): Payer: Medicaid Other | Admitting: Psychiatry

## 2020-08-13 ENCOUNTER — Other Ambulatory Visit (HOSPITAL_COMMUNITY)
Admission: RE | Admit: 2020-08-13 | Discharge: 2020-08-13 | Disposition: A | Discharge status: Home / Self Care | Payer: Medicaid Other | Source: Ambulatory Visit | Attending: Family Medicine | Admitting: Family Medicine

## 2020-08-13 ENCOUNTER — Ambulatory Visit (INDEPENDENT_AMBULATORY_CARE_PROVIDER_SITE_OTHER): Payer: Medicaid Other | Admitting: Family Medicine

## 2020-08-13 ENCOUNTER — Encounter: Payer: Self-pay | Admitting: Family Medicine

## 2020-08-13 ENCOUNTER — Encounter (HOSPITAL_COMMUNITY): Payer: Self-pay | Admitting: Psychiatry

## 2020-08-13 ENCOUNTER — Other Ambulatory Visit: Payer: Self-pay

## 2020-08-13 VITALS — BP 128/70 | HR 80 | Ht 68.0 in | Wt 231.4 lb

## 2020-08-13 DIAGNOSIS — E785 Hyperlipidemia, unspecified: Secondary | ICD-10-CM | POA: Diagnosis not present

## 2020-08-13 DIAGNOSIS — Z113 Encounter for screening for infections with a predominantly sexual mode of transmission: Secondary | ICD-10-CM

## 2020-08-13 DIAGNOSIS — E039 Hypothyroidism, unspecified: Secondary | ICD-10-CM | POA: Diagnosis not present

## 2020-08-13 DIAGNOSIS — F323 Major depressive disorder, single episode, severe with psychotic features: Secondary | ICD-10-CM

## 2020-08-13 DIAGNOSIS — Z720 Tobacco use: Secondary | ICD-10-CM | POA: Diagnosis not present

## 2020-08-13 DIAGNOSIS — Z23 Encounter for immunization: Secondary | ICD-10-CM | POA: Diagnosis not present

## 2020-08-13 DIAGNOSIS — E1169 Type 2 diabetes mellitus with other specified complication: Secondary | ICD-10-CM

## 2020-08-13 LAB — POCT GLYCOSYLATED HEMOGLOBIN (HGB A1C): HbA1c, POC (controlled diabetic range): 7.5 % — AB (ref 0.0–7.0)

## 2020-08-13 MED ORDER — TRAZODONE HCL 50 MG PO TABS: 50.0000 mg | ORAL_TABLET | Freq: Every evening | ORAL | 2 refills | Status: DC | PRN

## 2020-08-13 MED ORDER — SERTRALINE HCL 100 MG PO TABS: 150.0000 mg | ORAL_TABLET | Freq: Every day | ORAL | 2 refills | Status: DC

## 2020-08-13 MED ORDER — ARIPIPRAZOLE 5 MG PO TABS: 5.0000 mg | ORAL_TABLET | Freq: Every day | ORAL | 2 refills | Status: DC

## 2020-08-13 NOTE — Assessment & Plan Note (Signed)
Here A1C is about the same. Compliance with Jardiance discussed. F/U in 3 months for reassessment. Continue home CBG monitoring.

## 2020-08-13 NOTE — Assessment & Plan Note (Signed)
Counseling offered today. She declined pharmacotherapy. Monitor closely.

## 2020-08-13 NOTE — Assessment & Plan Note (Signed)
Stable on her current regimen. 
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
## 2020-08-13 NOTE — Patient Instructions (Signed)
Carbohydrate Counting for Diabetes Mellitus, Adult  Carbohydrate counting is a method of keeping track of how many carbohydrates you eat. Eating carbohydrates naturally increases the amount of sugar (glucose) in the blood. Counting how many carbohydrates you eat helps keep your blood glucose within normal limits, which helps you manage your diabetes (diabetes mellitus). It is important to know how many carbohydrates you can safely have in each meal. This is different for every person. A diet and nutrition specialist (registered dietitian) can help you make a meal plan and calculate how many carbohydrates you should have at each meal and snack. Carbohydrates are found in the following foods:  Grains, such as breads and cereals.  Dried beans and soy products.  Starchy vegetables, such as potatoes, peas, and corn.  Fruit and fruit juices.  Milk and yogurt.  Sweets and snack foods, such as cake, cookies, candy, chips, and soft drinks. How do I count carbohydrates? There are two ways to count carbohydrates in food. You can use either of the methods or a combination of both. Reading "Nutrition Facts" on packaged food The "Nutrition Facts" list is included on the labels of almost all packaged foods and beverages in the [place] It includes:  The serving size.  Information about nutrients in each serving, including the grams (g) of carbohydrate per serving. To use the "Nutrition Facts":  Decide how many servings you will have.  Multiply the number of servings by the number of carbohydrates per serving.  The resulting number is the total amount of carbohydrates that you will be having. Learning standard serving sizes of other foods When you eat carbohydrate foods that are not packaged or do not include "Nutrition Facts" on the label, you need to measure the servings in order to count the amount of carbohydrates:  Measure the foods that you will eat with a food scale or measuring cup, if  needed.  Decide how many standard-size servings you will eat.  Multiply the number of servings by 15. Most carbohydrate-rich foods have about 15 g of carbohydrates per serving. ? For example, if you eat 8 oz (170 g) of strawberries, you will have eaten 2 servings and 30 g of carbohydrates (2 servings x 15 g = 30 g).  For foods that have more than one food mixed, such as soups and casseroles, you must count the carbohydrates in each food that is included. The following list contains standard serving sizes of common carbohydrate-rich foods. Each of these servings has about 15 g of carbohydrates:   hamburger bun or  English muffin.   oz (15 mL) syrup.   oz (14 g) jelly.  1 slice of bread.  1 six-inch tortilla.  3 oz (85 g) cooked rice or pasta.  4 oz (113 g) cooked dried beans.  4 oz (113 g) starchy vegetable, such as peas, corn, or potatoes.  4 oz (113 g) hot cereal.  4 oz (113 g) mashed potatoes or  of a large baked potato.  4 oz (113 g) canned or frozen fruit.  4 oz (120 mL) fruit juice.  4-6 crackers.  6 chicken nuggets.  6 oz (170 g) unsweetened dry cereal.  6 oz (170 g) plain fat-free yogurt or yogurt sweetened with artificial sweeteners.  8 oz (240 mL) milk.  8 oz (170 g) fresh fruit or one small piece of fruit.  24 oz (680 g) popped popcorn. Example of carbohydrate counting Sample meal  3 oz (85 g) chicken breast.  6 oz (170 g)   brown rice.  4 oz (113 g) corn.  8 oz (240 mL) milk.  8 oz (170 g) strawberries with sugar-free whipped topping. Carbohydrate calculation 1. Identify the foods that contain carbohydrates: ? Rice. ? Corn. ? Milk. ? Strawberries. 2. Calculate how many servings you have of each food: ? 2 servings rice. ? 1 serving corn. ? 1 serving milk. ? 1 serving strawberries. 3. Multiply each number of servings by 15 g: ? 2 servings rice x 15 g = 30 g. ? 1 serving corn x 15 g = 15 g. ? 1 serving milk x 15 g = 15 g. ? 1  serving strawberries x 15 g = 15 g. 4. Add together all of the amounts to find the total grams of carbohydrates eaten: ? 30 g + 15 g + 15 g + 15 g = 75 g of carbohydrates total. Summary  Carbohydrate counting is a method of keeping track of how many carbohydrates you eat.  Eating carbohydrates naturally increases the amount of sugar (glucose) in the blood.  Counting how many carbohydrates you eat helps keep your blood glucose within normal limits, which helps you manage your diabetes.  A diet and nutrition specialist (registered dietitian) can help you make a meal plan and calculate how many carbohydrates you should have at each meal and snack. This information is not intended to replace advice given to you by your health care provider. Make sure you discuss any questions you have with your health care provider. Document Revised: 05/24/2017 Document Reviewed: 04/12/2016 Elsevier Patient Education  2020 Elsevier Inc.  

## 2020-08-13 NOTE — Assessment & Plan Note (Signed)
Stable on Synthroid.

## 2020-08-13 NOTE — Progress Notes (Signed)
    SUBJECTIVE:   CHIEF COMPLAINT / HPI:   DM2:Poor adherence with her Jardiance. However, she recently started taking it back.  Depression: Compliant with her meds and Pcysh follow- up. Psych last seen yesterday. Currently on  Abilify 5 mg QD,Zoloft 150 mg qd and Trazodone 50 mg QHS PRN. She feels better.  Hypothyroidism:Compliant with her meds. No concerns.  Smoking: Now at a half a pack per day from 1 PPD daily.  COVID shot: 2nd dose in August. Feels like it was the 3rd week of August. Cant remember the exact date and does not have her Card.  STD screen: Here for testing. Hx of vaginal discharge.   PERTINENT  PMH / PSH: PMX reviewed.  OBJECTIVE:   BP 128/70   Pulse 80   Ht 5\' 8"  (1.727 m)   Wt 231 lb 6 oz (105 kg)   LMP 07/26/2020   SpO2 100%   BMI 35.18 kg/m   Physical Exam Vitals and nursing note reviewed. Exam conducted with a chaperone present [facility] [place]).  Constitutional:      Appearance: Normal appearance.  Cardiovascular:     Rate and Rhythm: Normal rate and regular rhythm.     Heart sounds: Normal heart sounds. No murmur heard.   Pulmonary:     Effort: Pulmonary effort is normal. No respiratory distress.     Breath sounds: Normal breath sounds. No wheezing or rhonchi.  Abdominal:     General: Abdomen is flat. Bowel sounds are normal. There is no distension.     Palpations: Abdomen is soft. There is no mass.     Tenderness: There is no abdominal tenderness.  Genitourinary:    Vagina: Normal.     Cervix: Normal.     Uterus: Normal.      Adnexa: Right adnexa normal.  Musculoskeletal:        General: Normal range of motion.  Neurological:     General: No focal deficit present.     Mental Status: She is alert.  Psychiatric:        Mood and Affect: Mood normal.        Behavior: Behavior normal.      ASSESSMENT/PLAN:   Type 2 diabetes mellitus with hyperlipidemia (HCC) Here A1C is about the same. Compliance with Jardiance discussed. F/U in  3 months for reassessment. Continue home CBG monitoring.  Major depressive disorder, single episode, severe with psychotic features ([place]) Stable on her current regimen.  Hypothyroidism Stable on Synthroid.  Tobacco abuse Counseling offered today. She declined pharmacotherapy. Monitor closely.    Vaginitis: STD screen + Wet prep completed. Unfortunately, her wet prep was sent out since the lab was >1 hour behind. I will call her soon as I have her result. She agreed with the plan.  I called her to verify her COVID-19 vaccination. She was still unable to find her card. She was certain of her vaccination month. Chart updated.   Andrena Mews, MD [place]

## 2020-08-14 LAB — RPR: RPR Ser Ql: NONREACTIVE

## 2020-08-14 LAB — HIV ANTIBODY (ROUTINE TESTING W REFLEX): HIV Screen 4th Generation wRfx: NONREACTIVE

## 2020-08-16 LAB — CERVICOVAGINAL ANCILLARY ONLY
Bacterial Vaginitis (gardnerella): NEGATIVE
Candida Glabrata: NEGATIVE
Candida Vaginitis: NEGATIVE
Chlamydia: NEGATIVE
Comment: NEGATIVE
Comment: NEGATIVE
Comment: NEGATIVE
Comment: NEGATIVE
Comment: NEGATIVE
Comment: NORMAL
Neisseria Gonorrhea: NEGATIVE
Trichomonas: NEGATIVE

## 2020-08-23 ENCOUNTER — Telehealth (HOSPITAL_COMMUNITY): Payer: Self-pay | Admitting: *Deleted

## 2020-08-23 ENCOUNTER — Ambulatory Visit: Payer: Medicaid Other

## 2020-08-23 NOTE — Chronic Care Management (AMB) (Signed)
Care Management   Follow Up Note   08/23/2020 Name: Tanya Nolan MRN: [id number] DOB: 1976-11-03  Referred by: [name], MD Reason for referral : Chronic Care Management (DM II)   Tanya Nolan is a 44 y.o. year old female who is a primary care patient of [name], MD. The care management team was consulted for assistance with care management and care coordination needs.    Review of patient status, including review of consultants reports, relevant laboratory and other test results, and collaboration with appropriate care team members and the patient's provider was performed as part of comprehensive patient evaluation and provision of chronic care management services.    SDOH (Social Determinants of Health) assessments performed: No See Care Plan activities for detailed interventions related to [facility])     Advanced Directives: See Care Plan and Vynca application for related entries.   Goals Addressed              This Visit's Progress   .  I don't check my blood sugars. (pt-stated)        CARE PLAN ENTRY (see longtitudinal plan of care for additional care plan information)  Objective:  Lab Results  Component Value Date   HGBA1C 7.5 (A) 08/13/2020 .   Lab Results  Component Value Date   CREATININE 0.74 03/23/2020   CREATININE 0.86 03/14/2020   CREATININE 0.70 06/20/2019    Current Barriers:  Marland Kitchen Knowledge Deficits related to basic Diabetes pathophysiology and self care/management . Does not use cbg meter - patient states that she does not like to stick her finger.   Case Manager Clinical Goal(s):  Over the next 90 days, patient will demonstrate improved adherence to prescribed treatment plan for diabetes self care/management as evidenced by: learning to check her blood sugars daily and lowering her a1c by 1-2 points  Interventions:  . Provided education to patient about basic DM disease process . Reviewed medications with patient and discussed  importance of medication adherence . Discussed plans with patient for ongoing care management follow up and provided patient with direct contact information for care management team . Provided patient with verbal educational materials related to hypo and hyperglycemia and importance of correct treatment . Advised patient, providing education and rationale, to check cbg daily and record, calling the office  for findings outside established parameters.   . Review of patient status, including review of consultants reports, relevant laboratory and other test results, and medications completed. . Patient states that she is checking her blood sugars every other day since she does not like to stick  her finger.  She is checking it at alternating times.  She only checks once daily. Her blood sugars range 120-140. Marland Kitchen She had a visit with Dr Gwendlyn Deutscher on 10/1 and her a1c went down 1 point to 7.5.  She stated that she is taking all of her medications.  She is back taking her synthroid and states that "I don't crave sweets as much" but she is trying to figure out when is the best time to take it. She does not want to take it with her cholesterol medications . The patient states that she is sleeping well. She is trying to monitor her foods. . She has had her eyes checked this years and had her feet checked at her visit with her PCP.  We did discuss foot care. . Her next DM visit will be in 3 months  Patient Self Care Activities:  . UNABLE to independently self manage diabetes . Attends all scheduled provider appointments  Please see past updates related to this goal by clicking on the "Past Updates" button in the selected goal          The care management team will reach out to the patient again over the next 14 days.   Lazaro Arms RN, BSN, [facility] Care Management Coordinator [place] Phone: [phone] Fax: [phone]

## 2020-08-23 NOTE — Telephone Encounter (Signed)
Prior authorization obtained for aripiprazole 5 mg thur10/11/2020. [id number]. Pharmacy and patient made aware.

## 2020-09-06 ENCOUNTER — Ambulatory Visit: Payer: Medicaid Other

## 2020-09-06 NOTE — Chronic Care Management (AMB) (Signed)
RN  Care Management   Follow Up Note   09/06/2020 Name: Tanya Nolan MRN: [id number] DOB: 21-Jul-1976  Reason for referral : Appointment (DM II)   Tanya Nolan is a 44 y.o. year old female who is a primary care patient of [name], MD. The care management team was consulted for assistance with care management and care coordination needs.    Subjective: " I am feeling In between"  Assessment: called to follow up with the patient regarding her diabetes.  The patient said that she has not been checking because her fingers are sensitive and she doesn't like to stick them.   Goals Addressed              This Visit's Progress   .  I don't check my blood sugars. (pt-stated)        CARE PLAN ENTRY (see longtitudinal plan of care for additional care plan information)  Objective:  Lab Results  Component Value Date   HGBA1C 7.5 (A) 08/13/2020 .   Lab Results  Component Value Date   CREATININE 0.74 03/23/2020   CREATININE 0.86 03/14/2020   CREATININE 0.70 06/20/2019    Current Barriers:  Marland Kitchen Knowledge Deficits related to basic Diabetes pathophysiology and self care/management . Does not use cbg meter - patient states that she does not like to stick her finger.   Case Manager Clinical Goal(s):  Over the next 90 days, patient will demonstrate improved adherence to prescribed treatment plan for diabetes self care/management as evidenced by: learning to check her blood sugars daily and lowering her a1c by 1-2 points  Interventions:  . Provided education to patient about basic DM disease process . Reviewed medications with patient and discussed importance of medication adherence . Discussed plans with patient for ongoing care management follow up and provided patient with direct contact information for care management team . Provided patient with verbal educational materials related to hypo and hyperglycemia and importance of correct treatment . Advised patient,  providing education and rationale, to check CBG daily and record, calling the office  for findings outside established parameters.   . Review of patient status, including review of consultants reports, relevant laboratory and other test results, and medications completed. . Patient states that she is not checking her blood sugars because her fingers are sensitive. Talked about the importance of why she needs to check her blood sugars. She verbalized that she will try to check at least once a day and have some values for me the next time that I call. Discussed signs and symptoms of Hypo and Hyperglycemia.  She said she in not having any symptoms.  She did say she felt like she was having a bladder infection.  Advised her to go get checked before her symptoms get worse.      . She states that she taking her medications regularly and she has decided to take her synthroid in the morning and other meds midday.   . Discussed diet and she stated that " I am a grazer"  She states that she is not eating all healthy foods .  We discussed vegetables and chicken and fish.  She said she is trying to stay a way from red meat.  Patient Self Care Activities:  . UNABLE to independently self manage diabetes . Attends all scheduled provider appointments  Please see past updates related to this goal by clicking on the "Past Updates" button in the selected goal         Review of patient status, including review of consultants reports, relevant laboratory and other test results, and collaboration with appropriate care team members and the patient's provider was performed as part of comprehensive patient evaluation and provision of chronic care management services.    SDOH (Social Determinants of Health) assessments performed: No See Care Plan activities for detailed interventions related to SDOH)          Plan: Telephone follow up with Tanya Nolan over the next 14 days.   Lazaro Arms RN, BSN, [facility] Care Management Coordinator [place] Phone: [phone] Fax: [phone]

## 2020-09-06 NOTE — Patient Instructions (Addendum)
Visit Information  Goals Addressed              This Visit's Progress   .  I don't check my blood sugars. (pt-stated)        CARE PLAN ENTRY (see longtitudinal plan of care for additional care plan information)  Objective:  Lab Results  Component Value Date   HGBA1C 7.5 (A) 08/13/2020 .   Lab Results  Component Value Date   CREATININE 0.74 03/23/2020   CREATININE 0.86 03/14/2020   CREATININE 0.70 06/20/2019    Current Barriers:  Marland Kitchen Knowledge Deficits related to basic Diabetes pathophysiology and self care/management . Does not use cbg meter - patient states that she does not like to stick her finger.   Case Manager Clinical Goal(s):  Over the next 90 days, patient will demonstrate improved adherence to prescribed treatment plan for diabetes self care/management as evidenced by: learning to check her blood sugars daily and lowering her a1c by 1-2 points  Interventions:  . Provided education to patient about basic DM disease process . Reviewed medications with patient and discussed importance of medication adherence . Discussed plans with patient for ongoing care management follow up and provided patient with direct contact information for care management team . Provided patient with verbal educational materials related to hypo and hyperglycemia and importance of correct treatment . Advised patient, providing education and rationale, to check CBG daily and record, calling the office  for findings outside established parameters.   . Review of patient status, including review of consultants reports, relevant laboratory and other test results, and medications completed. . Patient states that she is not checking her blood sugars because her fingers are sensitive. Talked about the importance of why she needs to check her blood sugars. She verbalized that she will try to check at least once a day and have some values for me the next time that I call. Discussed signs and symptoms of Hypo  and Hyperglycemia.  She said she in not having any symptoms.  She did say she felt like she was having a bladder infection.  Advised her to go get checked before her symptoms get worse.      . She states that she taking her medications regularly and she has decided to take her synthroid in the morning and other meds midday.   . Discussed diet and she stated that " I am a grazer"  She states that she is not eating all healthy foods .  We discussed vegetables and chicken and fish.  She said she is trying to stay a way from red meat.                                                                                                                             Patient Self Care Activities:  . UNABLE to independently self manage diabetes . Attends all scheduled provider appointments  Please see past updates related to this goal by clicking  on the "Past Updates" button in the selected goal         Ms. Monaco was given information about Care Management services today including:  1. Care Management services include personalized support from designated clinical staff supervised by her physician, including individualized plan of care and coordination with other care providers 2. 24/7 contact phone numbers for assistance for urgent and routine care needs. 3. The patient may stop CCM services at any time (effective at the end of the month) by phone call to the office staff.  Patient agreed to services and verbal consent obtained.   The patient verbalized understanding of instructions provided today and declined a print copy of patient instruction materials.   Plan: Telephone follow up with [name] over the next 14 days.   Lazaro Arms RN, BSN, [facility] Care Management Coordinator [place] Phone: [phone] Fax: [phone]

## 2020-09-21 ENCOUNTER — Telehealth: Payer: Self-pay

## 2020-09-21 ENCOUNTER — Telehealth: Payer: Medicaid Other

## 2020-09-21 NOTE — Telephone Encounter (Signed)
  Care Management   Outreach Note  09/21/2020 Name: Tanya Nolan MRN: [id number] DOB: May 02, 1976  Referred by: [name], MD Reason for referral : Appointment (DM II)   An unsuccessful telephone outreach was attempted today. The patient was referred to the case management team for assistance with care management and care coordination.   Follow Up Plan: A HIPAA compliant phone message was left for the patient providing contact information and requesting a return call.  The care management team will reach out to the patient again over the next 7-14 days.   Lazaro Arms RN, BSN, [facility] Care Management Coordinator [place] Phone: [phone] Fax: [phone]

## 2020-09-22 ENCOUNTER — Telehealth: Payer: Self-pay | Admitting: *Deleted

## 2020-09-22 NOTE — Chronic Care Management (AMB) (Signed)
  Care Management   Note  09/22/2020 Name: Tanya Nolan MRN: [id number] DOB: [birth year]  Tanya Nolan is a 44 y.o. year old female who is a primary care patient of [name], MD and is actively engaged with the care management team. I reached out to [name] by phone today to assist with re-scheduling a follow up visit with the RN Case Manager.  Follow up plan: Unsuccessful telephone outreach attempt made. A HIPAA compliant phone message was left for the patient providing contact information and requesting a return call. The care management team will reach out to the patient again over the next 7 days. If patient returns call to provider office, please advise to call [place] at [phone].  [place] Management  Direct Dial: [phone]

## 2020-09-22 NOTE — Telephone Encounter (Signed)
Spoke to patient rescheduled for F/U call on 09/29/2020

## 2020-09-22 NOTE — Chronic Care Management (AMB) (Signed)
  Care Management   Note  09/22/2020 Name: ARMANDINA IMAN MRN: [id number] DOB: [birth year]  HARLEIGH CIVELLO is a 44 y.o. year old female who is a primary care patient of [name], MD and is actively engaged with the care management team. I reached out to [name] by phone today to assist with re-scheduling a follow up visit with the RN Case Manager.  Follow up plan: Telephone appointment with care management team member scheduled for: 09/29/2020  [place] Management  Direct Dial: [phone]

## 2020-09-29 ENCOUNTER — Ambulatory Visit: Payer: Medicaid Other

## 2020-10-01 NOTE — Chronic Care Management (AMB) (Signed)
   RN  Care Management   Follow Up Note   10/01/2020 Name: EMMILY PELLEGRIN MRN: [id number] DOB: August 10, 1976  Reason for referral : Chronic Care Management (DM II)   Tanya Nolan is a 44 y.o. year old female who is a primary care patient of [name], MD. The care management team was consulted for assistance with care management and care coordination needs.      Assessment: Called patient to follow up on diabetes.  She stated that she had not checked.  She just found out a close friend had passed and did not have her mind one it.  Patient Care Plan: RN Case Manager  Problem Identified: Glycemic Management (Diabetes, Type 2)   Goal: Glycemic Management   Start Date: 10/01/2020  Expected End Date: 12/01/2020  Priority: Medium  Note:   Objective:  Lab Results  Component Value Date   HGBA1C 7.5 (A) 08/13/2020 .   Lab Results  Component Value Date   CREATININE 0.74 03/23/2020   CREATININE 0.86 03/14/2020   CREATININE 0.70 06/20/2019   Current Barriers:  Marland Kitchen Knowledge Deficits related to basic Diabetes pathophysiology and self care/management . Does not use cbg meter   Case Manager Clinical Goal(s):  Over the next 90 days, patient will demonstrate improved adherence to prescribed treatment plan for diabetes self care/management as evidenced by: by lowering her a1c by 1-2 points . daily monitoring and recording of CBG   Interventions:  . Provided education to patient about basic DM disease process . Discussed plans with patient for ongoing care management follow up and provided patient with direct contact information for care management team . Advised patient, providing education and rationale, to check cbg  and record, calling the office  for findings outside established parameters.   . Review of patient status, including review of consultants reports, relevant laboratory and other test results, and medications completed. . barriers to adherence to treatment plan  identified- patient stated that one of her close friend just recently passed and she just didn't feel like checking her blood sugars . blood glucose monitoring encouraged- at least once daily . use of blood glucose monitoring log promoted . Discussed healthy eating  Patient Goals/Self Care Activities:   Patient verbalizes understanding of plan Self-administers medications as prescribed  Calls pharmacy for medication refills  Call's provider office for new concerns or questions  check blood sugar at prescribed times  check blood sugar if I feel it is too high or too low  take the blood sugar meter to all doctor visits  schedule appointment with eye doctor  check feet daily for cuts, sores or redness  trim toenails straight across  wash and dry feet carefully every day  wear comfortable, cotton socks  wear comfortable, well-fitting shoes    Follow up Plan: The care management team will reach out to the patient again over the next 14 days.       Review of patient status, including review of consultants reports, relevant laboratory and other test results, and collaboration with appropriate care team members and the patient's provider was performed as part of comprehensive patient evaluation and provision of chronic care management services.    SDOH (Social Determinants of Health) assessments performed: No See Care Plan activities for detailed interventions related to SDOH)    [place], BSN, [place] Management Coordinator [place] Phone: [phone] Fax: [phone]

## 2020-10-03 NOTE — Patient Instructions (Addendum)
It was a pleasure to see you today!  1. Please see resources below in case of emergency, please don't hesitate to reach out, we want to help [phone].  2. Please consider going to [facility] walk-in clinic today or anytime this week, or [facility] (see below).  3. I will call you to follow up, please call me back or answer the phone so I know that you're ok :)  4. Follow up on 10/15/20 with Dr. Gwendlyn Deutscher  5. We will get some labs today.  If they are abnormal or we need to do something about them, I will call you.  If they are normal, I will send you a message on MyChart (if it is active) or a letter in the mail.  If you don't hear from [place] in 2 weeks, please call the office  [phone].   Be Well,  Dr. Chauncey Reading   If you are feeling suicidal or depression symptoms worsen please immediately go to:   [facility]  [address] [place], [place] [facility] [phone]    . If you are thinking about harming yourself or having thoughts of suicide, or if you know someone who is, seek help right away. . Call your doctor or mental health care provider. . Call 911 or go to a hospital emergency room to get immediate help, or ask a friend or family member to help you do these things. . Call the [place] National Suicide Prevention Lifeline's toll-free, 24-hour hotline at 1-800-273-TALK 972-243-1690) or TTY: 1-800-799-4 TTY 747-145-5086) to talk to a trained counselor. . If you are in crisis, make sure you are not left alone.  . If someone else is in crisis, make sure he or she is not left alone   [facility] [employer] (Domestic Violence, Rape & Victim Assistance [phone]  [facility]    (ONLY from 8am-4pm)    [phone]  [facility] (24/7)   714-434-0825  [place] National Suicide Hotline    (807)654-6192 Diamantina Monks)

## 2020-10-03 NOTE — Progress Notes (Signed)
SUBJECTIVE:   CHIEF COMPLAINT / HPI:   Vaginal Discharge: Patient is a 44 y.o. female presenting with dysuria and urgency for 3-4 days. She has also had incontinence for several months and has started wearing poise underwear. She reports that when she gets alerted by her bladder that she needs to urinate, it sometimes happens too quickly before she can get to a bathroom. She has tried Kegels in the past without improvement. She has no biological children. No fever, flank pain.  MDD: patient's PHQ-9 today is elevated to 10, with 1 selected as answer to question 9.  Patient reports that she has a history of MDD, has been hospitalized for MDD with psychotic features in the past.  At that point in time she was hearing voices, knew that they were abnormal, and took her self to the ED.  In the last month she has been struggling more as this year she has lost both her sister and her husband, and most recently over [place] she lost her sister-in-law with whom she was very close.  Patient has had more activated thoughts of suicidal ideation since her sister-in-law passed away few weeks ago.  She reports that she has thoughts multiple times throughout the day that she would be "better off dead" wishing she could "just disappear."  She reports that she has not made an attempt to harm herself since she was a teenager.  When asked if she has a plan, she says that she thinks about slitting her wrists.  She reports that she "would never do this," however she also admits that she fantasizes about it and struggles internally with thoughts of going through with it.  In terms of protective factors, patient has her mother who she helps take care of, as well as CJ her young nephew for whom she is the primary caregiver.  Patient also goes to work and enjoys her work, feels that it is something to take her mind off feeling sad all the time.  Patient is appropriately treated with sertraline 150 mg, Abilify, and trazodone.   She has a psychiatrist as well as a Transport planner, therapist is Barb Merino at [facility] [place] in [place].  She has an appointment this week to follow-up with therapy.  Patient gave me permission to speak with her therapist.  Patient feels that she can tell her mom about her struggles, however she does not want to as her mom is also suffering from the same losses and patient does not wish to add any additional burden.  Discussing plans for follow-up, patient reports that she wishes to go to work and does not wish to go to either [facility] or walk-in clinic at [facility] the [place].  Review of Systems  Constitutional: Positive for activity change and fatigue.  Genitourinary: Positive for dysuria and urgency.  Psychiatric/Behavioral: Positive for dysphoric mood and suicidal ideas. Negative for self-injury.  All other systems reviewed and are negative.  PERTINENT  PMH / PSH: MDD  OBJECTIVE:   BP 122/82   Pulse 84   Ht 5\' 8"  (1.727 m)   Wt 237 lb (107.5 kg)   LMP 09/13/2020 (Approximate)   SpO2 98%   BMI 36.04 kg/m    General: NAD, pleasant, able to participate in exam Respiratory: Normal effort, no obvious respiratory distress Psych: Low mood, full affect, tearful when discussing feelings.   ASSESSMENT/PLAN:   Major depressive disorder, single episode, severe with psychotic features [facility]) Patient has had an acute  trigger due to bereavment of severe symptoms of depression including passive SI, but no VAH/HI. She is compliant with her medication, continue current regimen. Preferred for patient to go to [facility] for assessment today or to go to walk-in clinic with [place] for therapeutic evaluation today. Patient does have capacity, and reports that she finds work protective as she can focus on something else. Given that patient has other protective factors including she has taken herself to the ED in the past when experiencing psychosis, I went  over crisis resources (see AVS), and will have close follow up as she does not meet criteria for IVC and opts not to go to [facility] or [facility] today. Patient has established relationships with psychiatry and therapy. Her therapist is aware of the acute changes, and is contacting patient daily. I will call patient tomorrow and Monday. She understands that if she does not answer the phone or does not return my call, I will call for a welfare check. She has a follow up appointment scheduled in clinic on 12/3 with Dr. Gwendlyn Deutscher. This patient was precepted with Drs. Rockney Ghee, and [place].   Incontinence in female UA not indicative of UTI, awaiting urine culture. Patient warrants pelvic exam for possible uterine associated problems, however, due to significant passive SI, unable to perform pelvic exam today. Will follow urine cx for results. Recommend patient follow up for pelvic exam in a few weeks when hopefully no SI.   Gladys Damme, MD [place]

## 2020-10-04 ENCOUNTER — Ambulatory Visit (INDEPENDENT_AMBULATORY_CARE_PROVIDER_SITE_OTHER): Payer: Medicaid Other | Admitting: Family Medicine

## 2020-10-04 ENCOUNTER — Encounter: Payer: Self-pay | Admitting: Family Medicine

## 2020-10-04 ENCOUNTER — Other Ambulatory Visit: Payer: Self-pay

## 2020-10-04 VITALS — BP 122/82 | HR 84 | Ht 68.0 in | Wt 237.0 lb

## 2020-10-04 DIAGNOSIS — F323 Major depressive disorder, single episode, severe with psychotic features: Secondary | ICD-10-CM

## 2020-10-04 DIAGNOSIS — R32 Unspecified urinary incontinence: Secondary | ICD-10-CM

## 2020-10-04 DIAGNOSIS — R399 Unspecified symptoms and signs involving the genitourinary system: Secondary | ICD-10-CM | POA: Diagnosis present

## 2020-10-04 LAB — POCT URINALYSIS DIP (MANUAL ENTRY)
Bilirubin, UA: NEGATIVE
Glucose, UA: 500 mg/dL — AB
Ketones, POC UA: NEGATIVE mg/dL
Leukocytes, UA: NEGATIVE
Nitrite, UA: NEGATIVE
Protein Ur, POC: NEGATIVE mg/dL
Spec Grav, UA: 1.015 (ref 1.010–1.025)
Urobilinogen, UA: 0.2 E.U./dL
pH, UA: 6 (ref 5.0–8.0)

## 2020-10-04 LAB — POCT UA - MICROSCOPIC ONLY

## 2020-10-05 ENCOUNTER — Telehealth: Payer: Self-pay | Admitting: Family Medicine

## 2020-10-05 DIAGNOSIS — R32 Unspecified urinary incontinence: Secondary | ICD-10-CM

## 2020-10-05 HISTORY — DX: Unspecified urinary incontinence: R32

## 2020-10-05 NOTE — Telephone Encounter (Signed)
Spoke with patient over the phone, she is not having thoughts of SI today. She reported that she received a phone call from her therapist, but was not able to answer it as she didn't hear it. She also did not disclose to her mother what's been going on with her. I encouraged her to call her therapist and talk to her mom, we also went over resources available to her in case of emergency. She states she will go to ED/[facility] if thoughts become worse. At previous admissions to [facility] she took herself to the ED, so she does have a history of reliably seeking help. Will call patient on Monday, 11/29 as well to check in on her.   Gladys Damme, MD [place] [facility], PGY-2

## 2020-10-05 NOTE — Assessment & Plan Note (Signed)
Patient has had an acute trigger due to bereavment of severe symptoms of depression including passive SI, but no VAH/HI. She is compliant with her medication, continue current regimen. Preferred for patient to go to [facility] for assessment today or to go to walk-in clinic with [place] for therapeutic evaluation today. Patient does have capacity, and reports that she finds work protective as she can focus on something else. Given that patient has other protective factors including she has taken herself to the ED in the past when experiencing psychosis, I went over crisis resources (see AVS), and will have close follow up as she does not meet criteria for IVC and opts not to go to [facility] or [facility] today. Patient has established relationships with psychiatry and therapy. Her therapist is aware of the acute changes, and is contacting patient daily. I will call patient tomorrow and Monday. She understands that if she does not answer the phone or does not return my call, I will call for a welfare check. She has a follow up appointment scheduled in clinic on 12/3 with Dr. Gwendlyn Deutscher. This patient was precepted with Drs. Rockney Ghee, and [place].

## 2020-10-05 NOTE — Assessment & Plan Note (Signed)
UA not indicative of UTI, awaiting urine culture. Patient warrants pelvic exam for possible uterine associated problems, however, due to significant passive SI, unable to perform pelvic exam today. Will follow urine cx for results. Recommend patient follow up for pelvic exam in a few weeks when hopefully no SI.

## 2020-10-07 LAB — URINE CULTURE

## 2020-10-11 ENCOUNTER — Ambulatory Visit: Payer: Medicaid Other

## 2020-10-11 NOTE — Patient Instructions (Signed)
Visit Information  Patient Goals/Self Care Activities:   Patient verbalizes understanding of plan Self-administers medications as prescribed  Calls pharmacy for medication refills  Call's provider office for new concerns or questions  check blood sugar at prescribed times  check blood sugar if I feel it is too high or too low  take the blood sugar meter to all doctor visits  schedule appointment with eye doctor  check feet daily for cuts, sores or redness  trim toenails straight across  wash and dry feet carefully every day  wear comfortable, cotton socks  wear comfortable, well-fitting shoes   Ms. Baumgarten was given information about Care Management services today including:  1. Care Management services include personalized support from designated clinical staff supervised by her physician, including individualized plan of care and coordination with other care providers 2. 24/7 contact phone numbers for assistance for urgent and routine care needs. 3. The patient may stop CCM services at any time (effective at the end of the month) by phone call to the office staff.  Patient agreed to services and verbal consent obtained.   The patient verbalized understanding of instructions, educational materials, and care plan provided today and declined offer to receive copy of patient instructions, educational materials, and care plan.   The care management team will reach out to the patient again over the next 21 days.   Lazaro Arms RN, BSN, [facility] Care Management Coordinator [place] Phone: [phone] Fax: [phone]

## 2020-10-11 NOTE — Chronic Care Management (AMB) (Signed)
RN  Care Management   Follow Up Note   10/11/2020 Name: Tanya Nolan MRN: [id number] DOB: [birth year]  Reason for referral : Chronic Care Management (DM II)   Tanya Nolan is a 44 y.o. year old female who is a primary care patient of Tanya Feil, MD. The care management team was consulted for assistance with care management and care coordination needs.      Assessment: Called to follow up on the patient about her diabetes.  She states that she is better but still having some feeling about her friend that has passed.  Patient Care Plan: RN Case Manager  Problem Identified: Glycemic Management (Diabetes, Type 2)   Goal: Glycemic Management   Start Date: 10/01/2020  Expected End Date: 12/01/2020  Priority: Medium  Note:   Objective:  Lab Results  Component Value Date   HGBA1C 7.5 (A) 08/13/2020 .   Lab Results  Component Value Date   CREATININE 0.74 03/23/2020   CREATININE 0.86 03/14/2020   CREATININE 0.70 06/20/2019   Current Barriers:  Marland Kitchen Knowledge Deficits related to basic Diabetes pathophysiology and self care/management . Does not use cbg meter   Case Manager Clinical Goal(s):  Over the next 90 days, patient will demonstrate improved adherence to prescribed treatment plan for diabetes self care/management as evidenced by: by lowering her a1c by 1-2 points . daily monitoring and recording of CBG   Interventions:  . Provided education to patient about basic DM disease process . Discussed plans with patient for ongoing care management follow up and provided patient with direct contact information for care management team . Advised patient, providing education and rationale, to check cbg  and record, calling the office  for findings outside established parameters.   . Review of patient status, including review of consultants reports, relevant laboratory and other test results, and medications completed. . barriers to adherence to treatment plan identified- patient  is still  not checking her blood sugars.  She wants to get over the problems she is feeling with her close friend passing.  She states that she is doing better.  She was able to talk with her therapist on 11/24.  She states that she went to work and then she went and spent time with her family for Thanksgiving Dinner. . blood glucose monitoring encouraged- RNCM again encouraged her to check her blood sugars at least once daily.  She states that she will once she gets over this issue.  She states that shed has a follow up appointment at the office this Friday 10/15/20 . use of blood glucose monitoring log promoted . Discussed healthy eating  Patient Goals/Self Care Activities:   Patient verbalizes understanding of plan Self-administers medications as prescribed  Calls pharmacy for medication refills  Call's provider office for new concerns or questions  check blood sugar at prescribed times  check blood sugar if I feel it is too high or too low  take the blood sugar meter to all doctor visits  schedule appointment with eye doctor  check feet daily for cuts, sores or redness  trim toenails straight across  wash and dry feet carefully every day  wear comfortable, cotton socks  wear comfortable, well-fitting shoes    Follow up Plan: The care management team will reach out to the patient again over the next 21 days.         Review of patient status, including review of consultants reports, relevant laboratory and other test results, and collaboration  with appropriate care team members and the patient's provider was performed as part of comprehensive patient evaluation and provision of chronic care management services.    SDOH (Social Determinants of Health) assessments performed: No See Care Plan activities for detailed interventions related to SDOH)      [place], BSN, [place] Management Coordinator [place] Phone: [phone] Fax:  [phone]

## 2020-10-14 ENCOUNTER — Telehealth: Payer: Self-pay | Admitting: Family Medicine

## 2020-10-14 NOTE — Telephone Encounter (Signed)
Called patient to discuss results and check in re: MDD. Unfortunately, no answer so left a VM. UA results show uncontrolled glucose, urine cutlure with normal flora, no UTI. Recommend patient should have pelvic exam due to symptoms of incontinence. She is scheduled for tomorrow, 12/3, with Dr. Gwendlyn Deutscher.  Gladys Damme, MD [facility], PGY-2

## 2020-10-15 ENCOUNTER — Encounter: Payer: Self-pay | Admitting: Family Medicine

## 2020-10-15 ENCOUNTER — Ambulatory Visit (INDEPENDENT_AMBULATORY_CARE_PROVIDER_SITE_OTHER): Payer: Medicaid Other | Admitting: Family Medicine

## 2020-10-15 ENCOUNTER — Other Ambulatory Visit: Payer: Self-pay

## 2020-10-15 VITALS — BP 132/80 | HR 90 | Ht 68.0 in | Wt 232.4 lb

## 2020-10-15 DIAGNOSIS — F323 Major depressive disorder, single episode, severe with psychotic features: Secondary | ICD-10-CM

## 2020-10-15 DIAGNOSIS — E785 Hyperlipidemia, unspecified: Secondary | ICD-10-CM

## 2020-10-15 DIAGNOSIS — Z720 Tobacco use: Secondary | ICD-10-CM

## 2020-10-15 DIAGNOSIS — E1169 Type 2 diabetes mellitus with other specified complication: Secondary | ICD-10-CM | POA: Diagnosis not present

## 2020-10-15 NOTE — Progress Notes (Signed)
    SUBJECTIVE:   CHIEF COMPLAINT / HPI:   MDD: Here for f/u. She is concern her mood might worsen on Nov 07 2018 which is the anniversary of her husband's demise. She will be working on that day to get her mind of him. She has been seeing her Psychologist and receiving counseling weekly. Not seeing a Psychiatrist. She is compliant with her Abilify 5 mg qd, Zoloft 150 mg qd and Trazodone prn at bedtime.   DM2: Compliant with Jardiance 10 mg qd. No concerns.  XIP:[id number] with Lipitor 80 mg qd. Here for f/u.  Smoking: She seems to be smoking more because of stress. She smokes more than 1 PPD. She attempted use of pharmacologic agents in the past to quit, but was ineffective.   PERTINENT  PMH / PSH: PMX reviewed  OBJECTIVE:   Vitals:   10/15/20 1007  BP: 132/80  Pulse: 90  SpO2: 99%  Weight: 232 lb 6.4 oz (105.4 kg)  Height: 5\' 8"  (1.727 m)    Physical Exam Vitals and nursing note reviewed.  Cardiovascular:     Rate and Rhythm: Normal rate and regular rhythm.     Heart sounds: Normal heart sounds. No murmur heard.   Pulmonary:     Effort: Pulmonary effort is normal. No respiratory distress.     Breath sounds: Normal breath sounds. No wheezing or rhonchi.  Abdominal:     General: Abdomen is flat. Bowel sounds are normal. There is no distension.     Palpations: Abdomen is soft. There is no mass.     Tenderness: There is no abdominal tenderness.  Musculoskeletal:     Right lower leg: No edema.     Left lower leg: No edema.  Psychiatric:        Attention and Perception: Attention normal.        Mood and Affect: Mood is depressed.        Speech: Speech normal.        Behavior: Behavior normal.        Thought Content: Thought content normal. Thought content does not include homicidal or suicidal ideation. Thought content does not include homicidal or suicidal plan.        Cognition and Memory: Cognition normal.        Judgment: Judgment normal.      ASSESSMENT/PLAN:    Major depressive disorder, single episode, severe with psychotic features ([place]) No SI or HI. Mood slightly worsened given the anniversary of her husband's demise. She is however compliant with counseling. I discussed need to establish care with a Psychiatrist for medication management. I gave her lists of Psychiatrist in the area for her to make an appointment. She will f/u with me in 4 weeks for reassessment.  Type 2 diabetes mellitus with hyperlipidemia (HCC) Stable on her current regimen. F/I in Jan/Feb for A1c check.  Hyperlipidemia Now compliant with her Lipitor. LDL checked today. I will call her with her result.  Tobacco abuse Counseling done. I offered referral to smoking cessation clinic with Dr. Valentina Lucks. She declined for now. I will continue to work with her on this.     Andrena Mews, MD [place]

## 2020-10-15 NOTE — Assessment & Plan Note (Signed)
Counseling done. I offered referral to smoking cessation clinic with Dr. Valentina Lucks. She declined for now. I will continue to work with her on this.

## 2020-10-15 NOTE — Patient Instructions (Signed)
Psychiatry Resource List (Adults and Children) Most of these providers will take Medicaid. please consult your insurance for a complete and updated list of available providers. When calling to make an appointment have your insurance information available to confirm you are covered.   [facility]:Psychiatry and Counseling [address]. [place], [place] [address] [phone]  [facility]  [place], [place]:   [facility]: [address]     [phone]   Linna Hoff: [place]. [place],        [phone] : [place],    [place]: [address],                   [place] [facility]: [place] [facility] [place]         [place]  (Psychiatry only; Adults /children 12 and over, will take Medicaid)  [place], [place], [place] [address]       [phone]   [facility] (Psychiatry & counseling ; adults & children ; will take Medicaid [address]  [address]  [place] [address]   Go on-line to complete referral ( [email or web address] [phone]   (Spanish therapist)  [facility]  Psychiatry & counseling; Adults and children;  Call Registration prior to scheduling an appointment [phone] [place]    [place], [place] [address]    [phone]  [facility] (Psychiatry & counseling; adults & children; Medicare no Medicaid)  [place], [place]  [address]      [phone]    [facility] (up to age 66)  Psychiatry & counseling ,will take Medicaid, must do counseling to receive psychiatry services  [address]. [place] [address]        ([facility] (Psychiatry & counseling; adults & children; will take Medicaid) Will need a referral from provider [address],  [place], [place]  [phone]   [facility] --- Walk-In Mon-Friday 8am-3pm ( will take Medicaid, Psychiatry, Adults & children,  [address], [place], [place]   [phone]   [facility]--, Walk-in M-F 8am-12pm and 1pm -3pm   (Counseling, Psychiatry, will take Medicaid, adults & children)  [address], [place], [place]  [phone]

## 2020-10-15 NOTE — Assessment & Plan Note (Signed)
Now compliant with her Lipitor. LDL checked today. I will call her with her result.

## 2020-10-15 NOTE — Assessment & Plan Note (Signed)
Stable on her current regimen. F/I in Jan/Feb for A1c check.

## 2020-10-15 NOTE — Assessment & Plan Note (Signed)
No SI or HI. Mood slightly worsened given the anniversary of her husband's demise. She is however compliant with counseling. I discussed need to establish care with a Psychiatrist for medication management. I gave her lists of Psychiatrist in the area for her to make an appointment. She will f/u with me in 4 weeks for reassessment.

## 2020-10-16 LAB — LDL CHOLESTEROL, DIRECT: LDL Direct: 143 mg/dL — ABNORMAL HIGH (ref 0–99)

## 2020-10-18 ENCOUNTER — Telehealth: Payer: Self-pay | Admitting: Family Medicine

## 2020-10-18 MED ORDER — EZETIMIBE 10 MG PO TABS: 10.0000 mg | ORAL_TABLET | Freq: Every day | ORAL | 1 refills | Status: DC

## 2020-10-18 NOTE — Addendum Note (Signed)
Addended by: Andrena Mews T on: 10/18/2020 11:34 AM   Modules accepted: Orders

## 2020-10-18 NOTE — Telephone Encounter (Signed)
HIPAA compliant callback message left.  Note: LDL improved but still elevated.  I need to discuss adding Zetia on her current regimen. I will follow-up with her.

## 2020-10-18 NOTE — Telephone Encounter (Signed)
LDL discussed. Improved since last checked.  Add Zetia on her Lipitor.  She agreed with the plan and has no additional questions.

## 2020-10-29 ENCOUNTER — Ambulatory Visit: Payer: Medicaid Other

## 2020-10-29 NOTE — Chronic Care Management (AMB) (Signed)
   RN  Care Management   Follow Up Note   10/29/2020 Name: Tanya Nolan MRN: [id number] DOB: May 21, 1976  Reason for referral : Chronic Care Management (DM II)   Tanya Nolan is a 44 y.o. year old female who is a primary care patient of [name], MD. The care management team was consulted for assistance with care management and care coordination needs.      Assessment: Spoke with the patient today and she is still not checking her blood sugars.  She states that she dos not like sticking her fingers.  Patient Care Plan: RN Case Manager  Problem Identified: Glycemic Management (Diabetes, Type 2)   Goal: Glycemic Management   Start Date: 10/01/2020  Expected End Date: 12/01/2020  Priority: Medium  Note:   Objective:  Lab Results  Component Value Date   HGBA1C 7.5 (A) 08/13/2020 .   Lab Results  Component Value Date   CREATININE 0.74 03/23/2020   CREATININE 0.86 03/14/2020   CREATININE 0.70 06/20/2019   Current Barriers:  Marland Kitchen Knowledge Deficits related to basic Diabetes pathophysiology and self care/management . Does not use cbg meter   Case Manager Clinical Goal(s):  Over the next 90 days, patient will demonstrate improved adherence to prescribed treatment plan for diabetes self care/management as evidenced by: by lowering her a1c by 1-2 points . daily monitoring and recording of CBG   Interventions:  . Provided education to patient about basic DM disease process . Discussed plans with patient for ongoing care management follow up and provided patient with direct contact information for care management team . Advised patient, providing education and rationale, to check cbg  and record, calling the office  for findings outside established parameters.   . Review of patient status, including review of consultants reports, relevant laboratory and other test results, and medications completed. . barriers to adherence to treatment plan identified- patient is still  not  checking her blood sugars.  She states that she is doing fine so signs or symptoms but she hates to stick her finger. Advised her to speak with her PCP.  She states that she will call and schedule and appointment to talk. . Blood glucose monitoring encouraged . use of blood glucose monitoring log promoted . Discussed healthy eating  Patient Goals/Self Care Activities:   Patient verbalizes understanding of plan Self-administers medications as prescribed  Calls pharmacy for medication refills  Call's provider office for new concerns or questions  check blood sugar at prescribed times  check blood sugar if I feel it is too high or too low  take the blood sugar meter to all doctor visits  Follow up and discuss issues with PCP about checking blood sugars  Follow up Plan: The care management team will reach out to the patient again over the next 21 days.         Review of patient status, including review of consultants reports, relevant laboratory and other test results, and collaboration with appropriate care team members and the patient's provider was performed as part of comprehensive patient evaluation and provision of chronic care management services.    SDOH (Social Determinants of Health) assessments performed: No See Care Plan activities for detailed interventions related to SDOH)      [place], BSN, [place] Management Coordinator [place] Phone: [phone] Fax: [phone]

## 2020-10-29 NOTE — Patient Instructions (Signed)
Ms. Spaugh  it was nice speaking with you. Please call me directly [phone] if you have questions about the goals we discussed. Goals Addressed              This Visit's Progress   .  I don't check my blood sugars. (pt-stated)        Patient Goals/Self Care Activities:   Patient verbalizes understanding of plan Self-administers medications as prescribed  Calls pharmacy for medication refills  Call's provider office for new concerns or questions  check blood sugar at prescribed times  check blood sugar if I feel it is too high or too low  take the blood sugar meter to all doctor visits  Follow up and discuss issues with PCP about checking blood sugars       Patient Care Plan: RN Case Manager  Problem Identified: Glycemic Management (Diabetes, Type 2)   Goal: Glycemic Management   Start Date: 10/01/2020  Expected End Date: 12/01/2020  Priority: Medium  Note:   Objective:  Lab Results  Component Value Date   HGBA1C 7.5 (A) 08/13/2020 .   Lab Results  Component Value Date   CREATININE 0.74 03/23/2020   CREATININE 0.86 03/14/2020   CREATININE 0.70 06/20/2019   Current Barriers:  Marland Kitchen Knowledge Deficits related to basic Diabetes pathophysiology and self care/management . Does not use cbg meter   Case Manager Clinical Goal(s):  Over the next 90 days, patient will demonstrate improved adherence to prescribed treatment plan for diabetes self care/management as evidenced by: by lowering her a1c by 1-2 points . daily monitoring and recording of CBG   Interventions:  . Provided education to patient about basic DM disease process . Discussed plans with patient for ongoing care management follow up and provided patient with direct contact information for care management team . Advised patient, providing education and rationale, to check cbg  and record, calling the office  for findings outside established parameters.   . Review of patient status, including review of  consultants reports, relevant laboratory and other test results, and medications completed. . barriers to adherence to treatment plan identified- patient is still  not checking her blood sugars.  She states that she is doing fine so signs or symptoms but she hates to stick her finger. Advised her to speak with her PCP.  She states that she will call and schedule and appointment to talk. . Blood glucose monitoring encouraged . use of blood glucose monitoring log promoted . Discussed healthy eating  Patient Goals/Self Care Activities:   Patient verbalizes understanding of plan Self-administers medications as prescribed  Calls pharmacy for medication refills  Call's provider office for new concerns or questions  check blood sugar at prescribed times  check blood sugar if I feel it is too high or too low  take the blood sugar meter to all doctor visits  Follow up and discuss issues with PCP about checking blood sugars  Follow up Plan: The care management team will reach out to the patient again over the next 21 days.         Ms. Ruggiero received Care Management services today:  1. Care Management services include personalized support from designated clinical staff supervised by her physician, including individualized plan of care and coordination with other care providers 2. 24/7 contact [phone] for assistance for urgent and routine care needs. 3. Care Management are voluntary services and be declined at any time by calling the office.  The patient verbalized understanding of instructions  provided today and declined a print copy of patient instruction materials.     Lazaro Arms, RN

## 2020-11-15 ENCOUNTER — Other Ambulatory Visit: Payer: Self-pay

## 2020-11-15 ENCOUNTER — Encounter (HOSPITAL_COMMUNITY): Payer: Self-pay | Admitting: Psychiatry

## 2020-11-15 ENCOUNTER — Telehealth (INDEPENDENT_AMBULATORY_CARE_PROVIDER_SITE_OTHER): Payer: Medicaid Other | Admitting: Psychiatry

## 2020-11-15 DIAGNOSIS — F323 Major depressive disorder, single episode, severe with psychotic features: Secondary | ICD-10-CM | POA: Diagnosis not present

## 2020-11-15 MED ORDER — SERTRALINE HCL 100 MG PO TABS: 100.0000 mg | ORAL_TABLET | Freq: Every day | ORAL | 2 refills | Status: DC

## 2020-11-15 MED ORDER — ARIPIPRAZOLE 5 MG PO TABS: 5.0000 mg | ORAL_TABLET | Freq: Every day | ORAL | 2 refills | Status: DC

## 2020-11-15 MED ORDER — TRAZODONE HCL 50 MG PO TABS: 50.0000 mg | ORAL_TABLET | Freq: Every evening | ORAL | 2 refills | Status: DC | PRN

## 2020-11-15 NOTE — Progress Notes (Signed)
[place] MD/PA/NP OP Progress Note Virtual Visit via Telephone Note  I connected with Tanya Nolan on 11/15/20 at 11:00 AM EST by telephone and verified that I am speaking with the correct person using two identifiers.  Location: Patient: home Provider: Clinic   I discussed the limitations, risks, security and privacy concerns of performing an evaluation and management service by telephone and the availability of in person appointments. I also discussed with the patient that there may be a patient responsible charge related to this service. The patient expressed understanding and agreed to proceed.   I provided 30 minutes of non-face-to-face time during this encounter.   11/15/2020 11:26 AM Tanya Nolan  MRN:  [id number]  Chief Complaint: "I don't like how the medications make me feel"  HPI: 45 year old female seen today for follow up psychiatric evaluation.  She has a history of major depression.  She is currently being managed on Zoloft 150 mg daily, trazodone 50 mg nightly and Abilify 5 mg daily.  She notes her medications are somewhat effective in managing her psychiatric conditions however notes that she feels sluggish.  Today she is pleasant, cooperative, and engaged in conversation.  Patient unable to log on virtually so her exam was done over the phone.  She notes that her medications are effective in managing her anxiety and depression however informed provider that they make her feel sluggish which she notes that she dislikes.  Provider conducted a GAD-7 and patient  scored a 5.  Provider also conducted a PHQ-9 and patient scored a 3.  Today she denies SI/HIVAH or paranoia.    Patient notes that she was never started on a lower dose of Zoloft and notes that she is always been on 150 mg.  She is agreeable to reducing Zoloft 171m to 100 mg to help manage depression and reduce feelings of sluggishness.  She will continue all other medications as prescribed.  No other concerns noted at this  time.   Visit Diagnosis:    ICD-10-CM   1. Major depressive disorder, single episode, severe with psychotic features ([place]  F32.3 sertraline (ZOLOFT) 100 MG tablet    traZODone (DESYREL) 50 MG tablet    ARIPiprazole (ABILIFY) 5 MG tablet    Past Psychiatric History:  depression   Past Medical History:  Past Medical History:  Diagnosis Date  . Abdominal pain 03/26/2009   Qualifier: Diagnosis of  By: PJerline PainMD, JAnderson Malta   . Anxiety   . BACK STRAIN, LUMBAR 03/26/2009   Qualifier: Diagnosis of  By: PJerline PainMD, JAnderson Malta   . Chalazion 10/01/2007   Qualifier: Diagnosis of  By: TGenene ChurnMD, JJanett Billow   . Chronic back pain    tx with ibuprofen  . Diabetes mellitus   . Hypercholesteremia   . Hyperlipidemia    no meds - tx with diet  . Hypertension   . Hypothyroidism   . Ingrown toenail 12/25/2014  . 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   C&B WNL  NEG PAPS after  . MDD (major depressive disorder), recurrent severe, without psychosis ([place] 03/15/2020  . Nipple discharge 10/13/2013  . Non compliance w medication regimen 10/13/2013  . 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 Thyroid dysfunction     Past Surgical History:  Procedure Laterality Date  . [place]PRESS[place]  2008  . HYSTEROSCOPY X 2  2007 / 2012   ENDOMETRIAL POLYPS REMOVED    Family Psychiatric History: Mother depression  Family  History:  Family History  Problem Relation Age of Onset  . Hypertension Sister   . Lupus Mother   . Hypertension Mother   . Heart disease Father   . Gout Father   . Lupus Father     Social History:  Social History   Socioeconomic History  . Marital status: Widowed    Spouse name: Not on file  . Number of children: Not on file  . Years of education: Not on file  . Highest education level: Not on file  Occupational History  . Not on file  Tobacco Use  . Smoking status: Current Every Day Smoker    Packs/day: 0.50    Types:  Cigarettes  . Smokeless tobacco: Never Used  Vaping Use  . Vaping Use: Never used  Substance and Sexual Activity  . Alcohol use: Yes    Alcohol/week: 0.0 standard drinks    Comment: socially - wine/liquor--Rare  . Drug use: No    Comment: past use "years ago" per patient  . Sexual activity: Yes    Birth control/protection: None    Comment: 1st intercourse 45 yo-More than 5 partners  Other Topics Concern  . Not on file  Social History Narrative  . Not on file   Social Determinants of Health   Financial Resource Strain: Not on file  Food Insecurity: Not on file  Transportation Needs: Not on file  Physical Activity: Not on file  Stress: Not on file  Social Connections: Not on file    Allergies: No Known Allergies  Metabolic Disorder Labs: Lab Results  Component Value Date   HGBA1C 7.5 (A) 08/13/2020   MPG 171.42 03/16/2020   MPG 171.42 03/16/2020   No results found for: PROLACTIN Lab Results  Component Value Date   CHOL 211 (H) 03/16/2020   TRIG 103 03/16/2020   HDL 28 (L) 03/16/2020   CHOLHDL 7.5 03/16/2020   VLDL 21 03/16/2020   LDLCALC 162 (H) 03/16/2020   LDLCALC 175 (H) 06/20/2019   Lab Results  Component Value Date   TSH 1.655 03/14/2020   TSH 3.980 06/20/2019    Therapeutic Level Labs: No results found for: LITHIUM No results found for: VALPROATE No components found for:  CBMZ  Current Medications: Current Outpatient Medications  Medication Sig Dispense Refill  . Accu-Chek FastClix Lancets MISC Use to check CBG TID 100 each 12  . ACCU-CHEK GUIDE test strip USE TO CHECK GBG 3 TIMES A DAY 100 strip 12  . albuterol (PROVENTIL HFA;VENTOLIN HFA) 108 (90 Base) MCG/ACT inhaler Inhale 2 puffs into the lungs every 6 (six) hours as needed for wheezing or shortness of breath. (Patient not taking: Reported on 08/13/2020) 1 Inhaler 0  . ARIPiprazole (ABILIFY) 5 MG tablet Take 1 tablet (5 mg total) by mouth daily. For mood control 30 tablet 2  . atorvastatin  (LIPITOR) 80 MG tablet Take 1 tablet (80 mg total) by mouth daily. 90 tablet 1  . BD ULTRA-FINE PEN NEEDLES 29G X 12.7MM MISC USE THREE TIMES A DAY BEFORE MEALS 1 each 2  . Blood Glucose Monitoring Suppl (ACCU-CHEK GUIDE) w/Device KIT 1 applicator by Does not apply route 3 (three) times daily. Use to check CBG TID 1 kit 0  . cetirizine (ZYRTEC) 10 MG tablet Take 1 tablet (10 mg total) by mouth daily. (Patient not taking: Reported on 08/13/2020) 30 tablet 11  . empagliflozin (JARDIANCE) 10 MG TABS tablet Take 1 tablet (10 mg total) by mouth daily. 90 tablet 3  .  enalapril (VASOTEC) 2.5 MG tablet Take 1 tablet (2.5 mg total) by mouth daily. For high blood pressure 90 tablet 1  . ezetimibe (ZETIA) 10 MG tablet Take 1 tablet (10 mg total) by mouth daily. 90 tablet 1  . levothyroxine (SYNTHROID) 125 MCG tablet Take 1 tablet (125 mcg total) by mouth daily before breakfast. Reported on 02/23/2016 90 tablet 1  . polyethylene glycol powder (GLYCOLAX/MIRALAX) 17 GM/SCOOP powder Take 17 g by mouth daily as needed. (Patient not taking: Reported on 08/13/2020) 3350 g 1  . sertraline (ZOLOFT) 100 MG tablet Take 1 tablet (100 mg total) by mouth daily. For depression 30 tablet 2  . traZODone (DESYREL) 50 MG tablet Take 1 tablet (50 mg total) by mouth at bedtime as needed for sleep. 30 tablet 2   No current facility-administered medications for this visit.     Musculoskeletal: Strength & Muscle Tone: Unable to assess due to telephone visit Gait & Station: Unable to assess due to telephone visit Patient leans: N/A  Psychiatric Specialty Exam: Review of Systems  There were no vitals taken for this visit.There is no height or weight on file to calculate BMI.  General Appearance: Unable to assess due to telephone visit  Eye Contact:  Unable to assess due to telephone visit  Speech:  Clear and Coherent and Normal Rate  Volume:  Normal  Mood:  Euthymic  Affect:  Appropriate and Congruent  Thought Process:   Coherent, Goal Directed and Linear  Orientation:  Full (Time, Place, and Person)  Thought Content: WDL and Logical   Suicidal Thoughts:  No  Homicidal Thoughts:  No  Memory:  Immediate;   Good Recent;   Good Remote;   Good  Judgement:  Good  Insight:  Good  Psychomotor Activity:  Normal  Concentration:  Concentration: Good and Attention Span: Good  Recall:  Good  Fund of Knowledge: Good  Language: Good  Akathisia:  No  Handed:  Right  AIMS (if indicated): Not done  Assets:  Communication Skills Desire for Improvement Financial Resources/Insurance Housing Social Support  ADL's:  Intact  Cognition: WNL  Sleep:  Good   Screenings: [place] Admission (Discharged) from 03/15/2020 in [facility]  AIMS Total Score 0    AUDIT   Flowsheet Row Admission (Discharged) from 03/15/2020 in [facility]  Alcohol Use Disorder Identification Test Final Score (AUDIT) 1    GAD-7   Flowsheet Row Video Visit [facility]/15/2020 in [facility]  Total GAD-7 Score 5    PHQ2-9   Flowsheet Row Video Visit from 11/15/2020 in [facility] Office Visit from 10/15/2020 in [facility] Office Visit from 10/04/2020 in [facility] Office Visit from 08/13/2020 in [facility] Office Visit from 05/11/2020 in [facility]  PHQ-2 Total Score '2 2 3 3 4  ' PHQ-9 Total Score '3 4 10 6 11       ' Assessment and Plan: Patient notes that she has very minimal anxiety and depression.  She however informed Probation officer that she dislikes how her medications made her feel, noting that they make her feel sluggish.  She is agreeable to reducing Zoloft 171m to 100 mg to help manage symptoms of anxiety and depression and reduce feelings of sluggishness.  She will continue all the medications prescribed  1. Major depressive  disorder, single episode, severe with psychotic  features ([place])  Reduced- sertraline (ZOLOFT) 100 MG tablet; Take 1 tablet (100 mg total) by mouth daily. For depression  Dispense: 30 tablet; Refill: 2 Continue- traZODone (DESYREL) 50 MG tablet; Take 1 tablet (50 mg total) by mouth at bedtime as needed for sleep.  Dispense: 30 tablet; Refill: 2 Continue- ARIPiprazole (ABILIFY) 5 MG tablet; Take 1 tablet (5 mg total) by mouth daily. For mood control  Dispense: 30 tablet; Refill: 2  Follow-up in 3 months  Salley Slaughter, NP 11/15/2020, 11:26 AM

## 2020-11-24 ENCOUNTER — Other Ambulatory Visit: Payer: Medicaid Other

## 2020-11-25 ENCOUNTER — Other Ambulatory Visit: Payer: Self-pay

## 2020-11-25 ENCOUNTER — Other Ambulatory Visit: Payer: Medicaid Other

## 2020-11-25 DIAGNOSIS — Z20822 Contact with and (suspected) exposure to covid-19: Secondary | ICD-10-CM

## 2020-11-26 ENCOUNTER — Ambulatory Visit: Payer: Medicaid Other

## 2020-11-26 NOTE — Chronic Care Management (AMB) (Signed)
Care Management    RN Visit Note  11/26/2020 Name: Tanya Nolan MRN: [id number] DOB: Sep 12, 1976  Subjective: Tanya Nolan is a 45 y.o. year old female who is a primary care patient of [name], MD. The care management team was consulted for assistance with disease management and care coordination needs.    Engaged with patient by telephone for follow up visit in response to provider referral for case management and/or care coordination services.   Consent to Services:   Tanya Nolan was given information about Care Management services today including:  1. Care Management services includes personalized support from designated clinical staff supervised by her physician, including individualized plan of care and coordination with other care providers 2. 24/7 contact phone numbers for assistance for urgent and routine care needs. 3. The patient may stop case management services at any time by phone call to the office staff.  Patient agreed to services and consent obtained.    Assessment: Patient continues to experience difficulty with sticking her fingers to check her blood sugars.. See Care Plan below for interventions and patient self-care actives. Follow up Plan: Patient would like continued follow-up.  CCM RNCM will outreach the patient in 30 days.  She will have someone to stick her finger and record the vales for our next visit.. Patient will call office if needed prior to next encounter Review of patient past medical history, allergies, medications, health status, including review of consultants reports, laboratory and other test data, was performed as part of comprehensive evaluation and provision of chronic care management services.   SDOH (Social Determinants of Health) assessments and interventions performed:    Care Plan  No Known Allergies  Outpatient Encounter Medications as of 11/26/2020  Medication Sig Note  . Accu-Chek FastClix Lancets MISC Use to check CBG TID    . ACCU-CHEK GUIDE test strip USE TO CHECK GBG 3 TIMES A DAY   . albuterol (PROVENTIL HFA;VENTOLIN HFA) 108 (90 Base) MCG/ACT inhaler Inhale 2 puffs into the lungs every 6 (six) hours as needed for wheezing or shortness of breath. (Patient not taking: Reported on 08/13/2020) 05/20/2020: Occasionally  . ARIPiprazole (ABILIFY) 5 MG tablet Take 1 tablet (5 mg total) by mouth daily. For mood control   . atorvastatin (LIPITOR) 80 MG tablet Take 1 tablet (80 mg total) by mouth daily.   . BD ULTRA-FINE PEN NEEDLES 29G X 12.7MM MISC USE THREE TIMES A DAY BEFORE MEALS   . Blood Glucose Monitoring Suppl (ACCU-CHEK GUIDE) w/Device KIT 1 applicator by Does not apply route 3 (three) times daily. Use to check CBG TID   . cetirizine (ZYRTEC) 10 MG tablet Take 1 tablet (10 mg total) by mouth daily. (Patient not taking: Reported on 08/13/2020) 05/20/2020: Takes when she needs it  . empagliflozin (JARDIANCE) 10 MG TABS tablet Take 1 tablet (10 mg total) by mouth daily.   . enalapril (VASOTEC) 2.5 MG tablet Take 1 tablet (2.5 mg total) by mouth daily. For high blood pressure   . ezetimibe (ZETIA) 10 MG tablet Take 1 tablet (10 mg total) by mouth daily.   Marland Kitchen levothyroxine (SYNTHROID) 125 MCG tablet Take 1 tablet (125 mcg total) by mouth daily before breakfast. Reported on 02/23/2016   . polyethylene glycol powder (GLYCOLAX/MIRALAX) 17 GM/SCOOP powder Take 17 g by mouth daily as needed. (Patient not taking: Reported on 08/13/2020)   . sertraline (ZOLOFT) 100 MG tablet Take 1 tablet (100 mg total) by mouth daily. For depression   .  traZODone (DESYREL) 50 MG tablet Take 1 tablet (50 mg total) by mouth at bedtime as needed for sleep.   . [DISCONTINUED] insulin lispro protamine-insulin lispro (HUMALOG 75/25) (75-25) 100 UNIT/ML SUSP Inject 40-50 Units into the skin 2 (two) times daily with a meal. Patient takes 40 units in the morning and 50 units at night.  Verified it is Humalog 75/25. 11/02/2011: Took 5 units as order by anesthesia  at 1115  11/02/2011   No facility-administered encounter medications on file as of 11/26/2020.    Patient Active Problem List   Diagnosis Date Noted  . Incontinence in female 10/05/2020  . Major depressive disorder, single episode, severe with psychotic features ([place]) 08/13/2020  . Grief 11/10/2019  . Tobacco abuse 07/15/2019  . Chronic back pain 09/19/2016  . Hemorrhoids 02/02/2012  . Hypothyroidism 01/10/2007  . Type 2 diabetes mellitus with hyperlipidemia ([place]) 01/10/2007  . POLYCYSTIC OVARY 01/10/2007  . Hyperlipidemia 01/10/2007  . HYPERTENSION, BENIGN SYSTEMIC 01/10/2007    Conditions to be addressed/monitored: DMII  Care Plan : RN Case Manager  Updates made by Lazaro Arms, RN since 11/26/2020 12:00 AM  Problem: Glycemic Management (Diabetes, Type 2)   Long-Range Goal: Glycemic Management   Start Date: 10/01/2020  Expected End Date: 03/12/2021  This Visit's Progress: Not on track  Priority: Medium  Objective:  Lab Results  Component Value Date   HGBA1C 7.5 (A) 08/13/2020 .   Lab Results  Component Value Date   CREATININE 0.74 03/23/2020   CREATININE 0.86 03/14/2020   CREATININE 0.70 06/20/2019   Current Barriers:  Marland Kitchen Knowledge Deficits related to basic Diabetes pathophysiology and self care/management . Does not use cbg meter   Case Manager Clinical Goal(s):  Over the next 90 days, patient will demonstrate improved adherence to prescribed treatment plan for diabetes self care/management as evidenced by: by lowering her a1c by 1-2 points . daily monitoring and recording of CBG   Interventions:  . Provided education to patient about basic DM disease process . Discussed plans with patient for ongoing care management follow up and provided patient with direct contact information for care management team . Advised patient, providing education and rationale, to check cbg  and record, calling the office  for findings outside established parameters.   . Review of  patient status, including review of consultants reports, relevant laboratory and other test results, and medications completed. . barriers to adherence to treatment plan identified- patient is still  not checking her blood sugars.  She states that she is doing fine no signs or symptoms but she hates to stick her finger. Advised her to speak with her PCP about a Libre meter.  She states that she will call and schedule and appointment to talk. The patient states that one possibility that would help having someone else stick her finger.  RNCM asked to just start once a day at alternating times and record the values. Patient verbalized understanding . Blood glucose monitoring encouraged . use of blood glucose monitoring log promoted . Discussed healthy eating  Patient Goals/Self Care Activities:   Patient verbalizes understanding of plan Self-administers medications as prescribed  Calls pharmacy for medication refills  Call's provider office for new concerns or questions  check blood sugar at prescribed times  check blood sugar if I feel it is too high or too low  take the blood sugar meter to all doctor visits  Follow up and discuss issues with PCP about checking blood sugars  Lazaro Arms RN, BSN, [facility] Care Management Coordinator [place] Phone: [phone] I Fax: [phone]

## 2020-11-26 NOTE — Patient Instructions (Signed)
Tanya Nolan  it was nice speaking with you. Please call me directly [phone] if you have questions about the goals we discussed.  Goals Addressed              This Visit's Progress   .  I don't check my blood sugars. (pt-stated)        Timeframe:  Long-Range Goal Priority:  High Start Date:         05/20/20                    Expected End Date:     03/12/21                    Patient Goals/Self Care Activities:   Patient verbalizes understanding of plan Self-administers medications as prescribed  Calls pharmacy for medication refills  Call's provider office for new concerns or questions  check blood sugar at prescribed times  check blood sugar if I feel it is too high or too low  take the blood sugar meter to all doctor visits  Follow up and discuss issues with PCP about checking blood sugars       Patient Care Plan: RN Case Manager  Problem Identified: Glycemic Management (Diabetes, Type 2)   Long-Range Goal: Glycemic Management   Start Date: 10/01/2020  Expected End Date: 03/12/2021  This Visit's Progress: Not on track  Priority: Medium  Objective:  Lab Results  Component Value Date   HGBA1C 7.5 (A) 08/13/2020 .   Lab Results  Component Value Date   CREATININE 0.74 03/23/2020   CREATININE 0.86 03/14/2020   CREATININE 0.70 06/20/2019   Current Barriers:  Marland Kitchen Knowledge Deficits related to basic Diabetes pathophysiology and self care/management . Does not use cbg meter   Case Manager Clinical Goal(s):  Over the next 90 days, patient will demonstrate improved adherence to prescribed treatment plan for diabetes self care/management as evidenced by: by lowering her a1c by 1-2 points . daily monitoring and recording of CBG   Interventions:  . Provided education to patient about basic DM disease process . Discussed plans with patient for ongoing care management follow up and provided patient with direct contact information for care management team . Advised  patient, providing education and rationale, to check cbg  and record, calling the office  for findings outside established parameters.   . Review of patient status, including review of consultants reports, relevant laboratory and other test results, and medications completed. . barriers to adherence to treatment plan identified- patient is still  not checking her blood sugars.  She states that she is doing fine no signs or symptoms but she hates to stick her finger. Advised her to speak with her PCP about a Libre meter.  She states that she will call and schedule and appointment to talk. The patient states that one possibility that would help having someone else stick her finger.  RNCM asked to just start once a day at alternating times and record the values. Patient verbalized understanding . Blood glucose monitoring encouraged . use of blood glucose monitoring log promoted . Discussed healthy eating  Patient Goals/Self Care Activities:   Patient verbalizes understanding of plan Self-administers medications as prescribed  Calls pharmacy for medication refills  Call's provider office for new concerns or questions  check blood sugar at prescribed times  check blood sugar if I feel it is too high or too low  take the blood sugar meter to all doctor visits  Follow up and discuss issues with PCP about checking blood sugars         Tanya Nolan received Care Management services today:  1. Care Management services include personalized support from designated clinical staff supervised by her physician, including individualized plan of care and coordination with other care providers 2. 24/7 contact [phone] for assistance for urgent and routine care needs. 3. Care Management are voluntary services and be declined at any time by calling the office.  The patient verbalized understanding of instructions provided today and declined a print copy of patient instruction materials.    Lazaro Arms, RN

## 2020-11-28 LAB — NOVEL CORONAVIRUS, NAA: SARS-CoV-2, NAA: NOT DETECTED

## 2020-12-17 ENCOUNTER — Telehealth: Payer: Self-pay

## 2020-12-17 ENCOUNTER — Telehealth: Payer: Medicaid Other

## 2020-12-17 NOTE — Telephone Encounter (Signed)
  Care Management   Outreach Note  12/17/2020 Name: Tanya Nolan MRN: [id number] DOB: May 21, 1976  Referred by: [name], MD Reason for referral : Chronic Care Management (DM II)   An unsuccessful telephone outreach was attempted today. The patient was referred to the case management team for assistance with care management and care coordination.   Follow Up Plan: A HIPAA compliant phone message was left for the patient providing contact information and requesting a return call.  The care management team will reach out to the patient again over the next 7-14 days.   Lazaro Arms RN, BSN, [facility] Care Management Coordinator [place] Phone: [phone] I Fax: [phone]

## 2020-12-24 ENCOUNTER — Telehealth: Payer: Self-pay | Admitting: *Deleted

## 2020-12-24 NOTE — Chronic Care Management (AMB) (Signed)
  Care Management   Note  12/24/2020 Name: Tanya Nolan MRN: [id number] DOB: [birth year]  Tanya Nolan is a 45 y.o. year old female who is a primary care patient of [name], MD and is actively engaged with the care management team. I reached out to [name] by phone today to assist with re-scheduling a follow up visit with the RN Case Manager  Follow up plan: Unsuccessful telephone outreach attempt made. A HIPAA compliant phone message was left for the patient providing contact information and requesting a return call. The care management team will reach out to the patient again over the next 7 days. If patient returns call to provider office, please advise to call [place] at [phone].  [place] Management

## 2021-01-04 NOTE — Chronic Care Management (AMB) (Signed)
  Care Management   Note  01/04/2021 Name: Tanya Nolan MRN: [id number] DOB: [birth year]  Tanya Nolan is a 45 y.o. year old female who is a primary care patient of [name], MD and is actively engaged with the care management team. I reached out to [name] by phone today to assist with re-scheduling a follow up visit with the RN Case Manager  Follow up plan: A second unsuccessful telephone outreach attempt made. A HIPAA compliant phone message was left for the patient providing contact information and requesting a return call.  The care management team will reach out to the patient again over the next 7 days.  If patient returns call to provider office, please advise to call [place] at [phone].  [place] Management

## 2021-01-05 ENCOUNTER — Encounter: Payer: Self-pay | Admitting: Family Medicine

## 2021-01-05 ENCOUNTER — Ambulatory Visit (INDEPENDENT_AMBULATORY_CARE_PROVIDER_SITE_OTHER): Payer: Medicaid Other

## 2021-01-05 ENCOUNTER — Ambulatory Visit (INDEPENDENT_AMBULATORY_CARE_PROVIDER_SITE_OTHER): Payer: Medicaid Other | Admitting: Family Medicine

## 2021-01-05 ENCOUNTER — Other Ambulatory Visit: Payer: Self-pay

## 2021-01-05 VITALS — BP 124/78 | HR 72 | Ht 68.0 in | Wt 222.0 lb

## 2021-01-05 DIAGNOSIS — Z720 Tobacco use: Secondary | ICD-10-CM

## 2021-01-05 DIAGNOSIS — E1169 Type 2 diabetes mellitus with other specified complication: Secondary | ICD-10-CM

## 2021-01-05 DIAGNOSIS — E785 Hyperlipidemia, unspecified: Secondary | ICD-10-CM | POA: Diagnosis not present

## 2021-01-05 DIAGNOSIS — Z23 Encounter for immunization: Secondary | ICD-10-CM | POA: Diagnosis not present

## 2021-01-05 LAB — POCT GLYCOSYLATED HEMOGLOBIN (HGB A1C): Hemoglobin A1C: 6.9 % — AB (ref 4.0–5.6)

## 2021-01-05 NOTE — Progress Notes (Signed)
I was either physically present or repeated all elements of the H&PE by MS3 Joelyn Oms.  I agree with his documentation and management.  Briefly, Lifestyle: Continues to focus on her health with good diet and exercise.  Wt loss noted. DM: Well controled. A1C at goal Tobacco abuse.  Counseled on cessation.  She quit before using the transition step of e cigarettes.  She will contact us if she wants help. Shin abrasion.  Not infected.  Healing well Weak pedal pulses but screening ABIs are reassuring. High cholesterol.  Zetia added to lipitor in Dec.  No check since beginning zetia.  Goal is LDL less than 100 since diabetic.  We will check direct LDL today.

## 2021-01-05 NOTE — Assessment & Plan Note (Addendum)
A1c today 6.9%, at goal. - Congratulated on success - Encouraged continued attention to diet and exercise goals - Continue Jardiance  - Follow-up in 3 months

## 2021-01-05 NOTE — Assessment & Plan Note (Signed)
Patient continues to smoke. Attributes motivation to stress surrounding the loss of her husband. Has a history of quitting. Defers pharmacologic intervention at this time. - Continue to support her efforts to quit - Made sure she knows we are here to offer pharmacologic intervention should she elect to go that direction

## 2021-01-05 NOTE — Progress Notes (Signed)
    SUBJECTIVE:   CHIEF COMPLAINT / HPI:   Sore on Leg Ms. Ehrich reports a scabbed-over wound on the pre-tibial aspect of her R leg that has been present for 2-3 weeks. She does not recall any specific trauma to the area, but says she has dogs and may have received a scratch there. She denies any purulence, drainage, or pain at rest. She has not had any fevers or other sores like this. She says that she was motivated to come in today because she had heard that "diabetics have problems with wounds."   Diabetes She has been working very hard to improve her health, including monitoring her diet and "moving a lot more." She is rightly very proud of the progress she has made in managing her diabetes.   Hyperlipidemia Her last LDL was 143 in December and she was started on Zetia at that time. Has been taking this without issue.   Tobacco Use She is still smoking about 1 pack per day. She says that one of the primary drivers behind her smoking has been the stress from the loss of her husband. She has quit successfully in the past by using e-cigarettes to bridge between cigarettes and stopping. She says that she has tried patches and gum but didn't like these and also did not like the way Chantix made her feel. Has not tried Welbutrin and would like to hold off for now.    PERTINENT  PMH / PSH: DM2, grief, depression, hyperlipidemia, tobacco use  OBJECTIVE:   BP 124/78   Pulse 72   Ht 5\' 8"  (1.727 m)   Wt 222 lb (100.7 kg)   LMP 01/03/2021 (Exact Date)   SpO2 97%   BMI 33.75 kg/m   Physical Exam Constitutional:      General: She is not in acute distress. Cardiovascular:     Pulses:          Popliteal pulses are 1+ on the right side.       Dorsalis pedis pulses are 1+ on the right side.  Skin:    General: Skin is warm and dry.     Findings: No rash.     Comments: 1 cm scab on pretibial aspect of R leg without surrounding tenderness, erythema or warmth. No drainage or pus appreciated.    Neurological:     Mental Status: She is alert.  Psychiatric:        Mood and Affect: Mood normal.        Behavior: Behavior normal.      ASSESSMENT/PLAN:   Type 2 diabetes mellitus with hyperlipidemia (HCC) A1c today 6.9%, at goal. - Congratulated on success - Encouraged continued attention to diet and exercise goals - Continue Jardiance  - Follow-up in 3 months  Hyperlipidemia - Continue statin and Zetia - Repeat Direct LDL today - Follow-up in 3 months   Tobacco abuse Patient continues to smoke. Attributes motivation to stress surrounding the loss of her husband. Has a history of quitting. Defers pharmacologic intervention at this time. - Continue to support her efforts to quit - Made sure she knows we are here to offer pharmacologic intervention should she elect to go that direction    Sore on Leg This appears to be an uncomplicated wound that is healing appropriately. No ulceration or sign of infection.  - Reassurance offered.   Pearla Dubonnet, [place]

## 2021-01-05 NOTE — Assessment & Plan Note (Signed)
-   Continue statin and Zetia - Repeat Direct LDL today - Follow-up in 3 months

## 2021-01-05 NOTE — Patient Instructions (Addendum)
I am so impressed with the changes you have made to improve your health. Great job.  You A1C is at goal meaning your diabetes is nicely controled on your current meds.   Of course, quitting smoking is the single biggest thing you could do to improve your long term health.  If you want help from [place], call and schedule an appointment with Dr. Valentina Lucks. I will call with the cholesterol test results. You got your COVID booster today.  You may feel a bit bad for a day or two from the immunization.   It looks like you will be due for your annual exam from Dr. Gwendlyn Deutscher in May.  Plan on making an appointment with her any time after 03/13/21 Also, you circulation test was good, no blockages.

## 2021-01-06 LAB — LDL CHOLESTEROL, DIRECT: LDL Direct: 65 mg/dL (ref 0–99)

## 2021-01-11 ENCOUNTER — Telehealth: Payer: Self-pay | Admitting: *Deleted

## 2021-01-11 NOTE — Chronic Care Management (AMB) (Signed)
  Care Management   Note  01/11/2021 Name: Tanya Nolan MRN: [id number] DOB: Dec 02, 1975  Tanya Nolan is a 45 y.o. year old female who is a primary care patient of [name], MD and is actively engaged with the care management team. I reached out to [name] by phone today to assist with re-scheduling a follow up visit with the RN Case Manager  Follow up plan: Unsuccessful telephone outreach attempt made. A HIPAA compliant phone message was left for the patient providing contact information and requesting a return call. Unable to make contact on outreach attempts x 3. PCP [name], MD notified via routed documentation in medical record. We have been unable to make contact with the patient for follow up. The care management team is available to follow up with the patient after provider conversation with the patient regarding recommendation for care management engagement and subsequent re-referral to the care management team. If patient returns call to provider office, please advise to call [place] at [phone].  [facility]  Direct Dial: [phone]

## 2021-01-11 NOTE — Chronic Care Management (AMB) (Signed)
  Care Management   Note  01/11/2021 Name: Tanya Nolan MRN: [id number] DOB: 1976-06-30  Tanya Nolan is a 45 y.o. year old female who is a primary care patient of [name], MD and is actively engaged with the care management team. I reached out to [name] by phone today to assist with re-scheduling a follow up visit with the RN Case Manager  Follow up plan: Telephone appointment with care management team member scheduled for:01/19/2021  [facility]

## 2021-01-19 ENCOUNTER — Ambulatory Visit: Payer: Medicaid Other

## 2021-01-19 NOTE — Chronic Care Management (AMB) (Signed)
Care Management    RN Visit Note  01/19/2021 Name: Tanya Nolan MRN: [id number] DOB: 11-Feb-1976  Subjective: Tanya Nolan is a 45 y.o. year old female who is a primary care patient of [name], MD. The care management team was consulted for assistance with disease management and care coordination needs.    Engaged with patient by telephone for follow up visit in response to provider referral for case management and/or care coordination services.   Consent to Services:   [name] was given information about Care Management services today including:  1. Care Management services includes personalized support from designated clinical staff supervised by her physician, including individualized plan of care and coordination with other care providers 2. 24/7 contact phone numbers for assistance for urgent and routine care needs. 3. The patient may stop case management services at any time by phone call to the office staff.  Patient agreed to services and consent obtained.    Assessment: Patient continues to experience difficulty with not checking her blood sugars... See Care Plan below for interventions and patient self-care actives. Follow up Plan: Patient would like continued follow-up.  CCM RNCM will will outreach the patient within the next 30 days.. Patient will call office if needed prior to next encounter t: Review of patient past medical history, allergies, medications, health status, including review of consultants reports, laboratory and other test data, was performed as part of comprehensive evaluation and provision of chronic care management services.   SDOH (Social Determinants of Health) assessments and interventions performed:    Care Plan  No Known Allergies  Outpatient Encounter Medications as of 01/19/2021  Medication Sig Note  . Accu-Chek FastClix Lancets MISC Use to check CBG TID   . ACCU-CHEK GUIDE test strip USE TO CHECK GBG 3 TIMES A DAY   . albuterol  (PROVENTIL HFA;VENTOLIN HFA) 108 (90 Base) MCG/ACT inhaler Inhale 2 puffs into the lungs every 6 (six) hours as needed for wheezing or shortness of breath. (Patient not taking: Reported on 08/13/2020) 05/20/2020: Occasionally  . ARIPiprazole (ABILIFY) 5 MG tablet Take 1 tablet (5 mg total) by mouth daily. For mood control   . atorvastatin (LIPITOR) 80 MG tablet Take 1 tablet (80 mg total) by mouth daily.   . BD ULTRA-FINE PEN NEEDLES 29G X 12.7MM MISC USE THREE TIMES A DAY BEFORE MEALS   . Blood Glucose Monitoring Suppl (ACCU-CHEK GUIDE) w/Device KIT 1 applicator by Does not apply route 3 (three) times daily. Use to check CBG TID   . cetirizine (ZYRTEC) 10 MG tablet Take 1 tablet (10 mg total) by mouth daily. (Patient not taking: Reported on 08/13/2020) 05/20/2020: Takes when she needs it  . empagliflozin (JARDIANCE) 10 MG TABS tablet Take 1 tablet (10 mg total) by mouth daily.   . enalapril (VASOTEC) 2.5 MG tablet Take 1 tablet (2.5 mg total) by mouth daily. For high blood pressure   . ezetimibe (ZETIA) 10 MG tablet Take 1 tablet (10 mg total) by mouth daily.   Marland Kitchen levothyroxine (SYNTHROID) 125 MCG tablet Take 1 tablet (125 mcg total) by mouth daily before breakfast. Reported on 02/23/2016   . polyethylene glycol powder (GLYCOLAX/MIRALAX) 17 GM/SCOOP powder Take 17 g by mouth daily as needed. (Patient not taking: Reported on 08/13/2020)   . sertraline (ZOLOFT) 100 MG tablet Take 1 tablet (100 mg total) by mouth daily. For depression   . traZODone (DESYREL) 50 MG tablet Take 1 tablet (50 mg total) by mouth at bedtime  as needed for sleep.   . [DISCONTINUED] insulin lispro protamine-insulin lispro (HUMALOG 75/25) (75-25) 100 UNIT/ML SUSP Inject 40-50 Units into the skin 2 (two) times daily with a meal. Patient takes 40 units in the morning and 50 units at night.  Verified it is Humalog 75/25. 11/02/2011: Took 5 units as order by anesthesia at 1115  11/02/2011   No facility-administered encounter medications on  file as of 01/19/2021.    Patient Active Problem List   Diagnosis Date Noted  . Incontinence in female 10/05/2020  . Major depressive disorder, single episode, severe with psychotic features ([place]) 08/13/2020  . Grief 11/10/2019  . Tobacco abuse 07/15/2019  . Chronic back pain 09/19/2016  . Hemorrhoids 02/02/2012  . Hypothyroidism 01/10/2007  . Type 2 diabetes mellitus with hyperlipidemia ([place]) 01/10/2007  . POLYCYSTIC OVARY 01/10/2007  . Hyperlipidemia 01/10/2007  . HYPERTENSION, BENIGN SYSTEMIC 01/10/2007    Conditions to be addressed/monitored: DMII  Care Plan : RN Case Manager  Updates made by Lazaro Arms, RN since 01/19/2021 12:00 AM  Problem: Glycemic Management (Diabetes, Type 2)   Long-Range Goal: Glycemic Management   Start Date: 10/01/2020  Expected End Date: 03/12/2021  Recent Progress: Not on track  Priority: Medium  Note:   Objective:  Lab Results  Component Value Date   HGBA1C 6.9 (A) 01/05/2021 .   Lab Results  Component Value Date   CREATININE 0.74 03/23/2020   CREATININE 0.86 03/14/2020   CREATININE 0.70 06/20/2019   Current Barriers:  Marland Kitchen Knowledge Deficits related to basic Diabetes pathophysiology and self care/management . Does not use cbg meter   Case Manager Clinical Goal(s):  Over the next 90 days, patient will demonstrate improved adherence to prescribed treatment plan for diabetes self care/management as evidenced by: by lowering her a1c by 1-2 points . daily monitoring and recording of CBG   Interventions:  . Provided education to patient about basic DM disease process . Discussed plans with patient for ongoing care management follow up and provided patient with direct contact information for care management team . Advised patient, providing education and rationale, to check cbg  and record, calling the office  for findings outside established parameters.   . Review of patient status, including review of consultants reports, relevant laboratory  and other test results, and medications completed. . barriers to adherence to treatment plan identified- patient is still  not checking her blood sugars.  She states that she is doing fine no signs or symptoms but she hates to stick her finger. Advised her to speak with her PCP about a Libre meter.  She states that she will call and schedule and appointment to talk.  RNCM asked to just start once a day at alternating times and record the values. Patient verbalized understanding.  RNCM congratulated the patient on lowering her A!C to 6.9. . Blood glucose monitoring encouraged . use of blood glucose monitoring log promoted . Discussed healthy eating  Patient Goals/Self Care Activities:   Patient verbalizes understanding of plan Self-administers medications as prescribed  Calls pharmacy for medication refills  Call's provider office for new concerns or questions  check blood sugar at prescribed times  check blood sugar if I feel it is too high or too low  take the blood sugar meter to all doctor visits  Follow up and discuss issues with PCP about checking blood sugars        Lazaro Arms RN, BSN, [place] Management Coordinator [place] Phone: [phone]  Fax: [phone]

## 2021-01-19 NOTE — Patient Instructions (Signed)
Visit Information  Tanya Nolan  it was nice speaking with you. Please call me directly [phone] if you have questions about the goals we discussed.  Goals Addressed              This Visit's Progress   .  I don't check my blood sugars. (pt-stated)   Not on track     Timeframe:  Long-Range Goal Priority:  High Start Date:         05/20/20                    Expected End Date:    04/12/21                   Patient Goals/Self Care Activities:   Patient verbalizes understanding of plan Self-administers medications as prescribed  Calls pharmacy for medication refills  Call's provider office for new concerns or questions  check blood sugar at prescribed times  check blood sugar if I feel it is too high or too low  take the blood sugar meter to all doctor visits  Follow up and discuss issues with PCP about checking blood sugars       The patient verbalized understanding of instructions, educational materials, and care plan provided today and declined offer to receive copy of patient instructions, educational materials, and care plan.   Follow up Plan: Patient would like continued follow-up.  CCM RNCM will outreach the patient within the next 30 days.. Patient will call office if needed prior to next encounter  Lazaro Arms, RN

## 2021-02-14 ENCOUNTER — Telehealth (INDEPENDENT_AMBULATORY_CARE_PROVIDER_SITE_OTHER): Payer: Medicaid Other | Admitting: Psychiatry

## 2021-02-14 ENCOUNTER — Encounter (HOSPITAL_COMMUNITY): Payer: Self-pay | Admitting: Psychiatry

## 2021-02-14 ENCOUNTER — Other Ambulatory Visit: Payer: Self-pay

## 2021-02-14 DIAGNOSIS — F323 Major depressive disorder, single episode, severe with psychotic features: Secondary | ICD-10-CM

## 2021-02-14 MED ORDER — ARIPIPRAZOLE 5 MG PO TABS: 5.0000 mg | ORAL_TABLET | Freq: Every day | ORAL | 2 refills | Status: DC

## 2021-02-14 MED ORDER — SERTRALINE HCL 100 MG PO TABS: 100.0000 mg | ORAL_TABLET | Freq: Every day | ORAL | 2 refills | Status: DC

## 2021-02-14 MED ORDER — TRAZODONE HCL 50 MG PO TABS
50.0000 mg | ORAL_TABLET | Freq: Every evening | ORAL | 2 refills | Status: DC | PRN
Start: 2021-02-14 — End: 2021-05-11

## 2021-02-14 NOTE — Progress Notes (Signed)
[place] MD/PA/NP OP Progress Note Virtual Visit via Video Note  I connected with Tanya Nolan on 02/14/21 at  1:00 PM EDT by a video enabled telemedicine application and verified that I am speaking with the correct person using two identifiers.  Location: Patient: Home Provider: Clinic   I discussed the limitations of evaluation and management by telemedicine and the availability of in person appointments. The patient expressed understanding and agreed to proceed.  I provided 30 minutes of non-face-to-face time during this encounter.      02/14/2021 1:14 PM Tanya Nolan  MRN:  [id number]  Chief Complaint: "Thing have been rough but i'm ok"  HPI: 45 year old female seen today for follow up psychiatric evaluation.  She has a history of major depression.  She is currently being managed on Zoloft 100 mg daily, trazodone 50 mg nightly and Abilify 5 mg daily.  She notes her medications are somewhat effective in managing her psychiatric conditions.  Today patient was pleasant, cooperative, engaged in conversation, and maintained eye contact.  She informed Probation officer that since her last visit things has been rough.  She notes that she has been more anxious and reverting back to feeling like she did a year ago.  Patient notes that to cope with her anxiety she smokes marijuana daily.  Provider informed patient that marijuana use can exacerbate her mental health conditions.  She endorsed understanding.  Provider conducted a GAD-7 and patient scored an 11, at her last visit she scored a 5.  Provider also conducted a PHQ-9 and patient scored a 7, at her last visit she scored a 3. Today she denies SI/HIVAH or paranoia.  She notes that she sleeps approximately 6 hours nightly.  Patient informed provider that her appetite has been poor and notes that she has lost approximately 15 pounds.  Provider recommended starting hydroxyzine to help manage anxiety, increasing Zoloft, or starting BuSpar.  Patient notes that  although she has anxiety she does not want her medications changed at this time.  Provider endorsed understanding.  No medication changes made today.  Patient agreeable to continue medication as prescribed.  She notes that she sees a therapist for counseling and plans to follow up with her therapist.  No other concerns noted at this time.   Visit Diagnosis:    ICD-10-CM   1. Major depressive disorder, single episode, severe with psychotic features ([place])  F32.3 ARIPiprazole (ABILIFY) 5 MG tablet    traZODone (DESYREL) 50 MG tablet    sertraline (ZOLOFT) 100 MG tablet    Past Psychiatric History:  depression   Past Medical History:  Past Medical History:  Diagnosis Date  . Abdominal pain 03/26/2009   Qualifier: Diagnosis of  By: Jerline Pain MD, Anderson Malta    . Anxiety   . BACK STRAIN, LUMBAR 03/26/2009   Qualifier: Diagnosis of  By: Jerline Pain MD, Anderson Malta    . Chalazion 10/01/2007   Qualifier: Diagnosis of  By: Genene Churn MD, Janett Billow    . Chronic back pain    tx with ibuprofen  . Diabetes mellitus   . Hypercholesteremia   . Hyperlipidemia    no meds - tx with diet  . Hypertension   . Hypothyroidism   . Ingrown toenail 12/25/2014  . 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   C&B WNL  NEG PAPS after  . MDD (major depressive disorder), recurrent severe, without psychosis ([place]) 03/15/2020  . Nipple discharge 10/13/2013  . Non compliance w medication regimen 10/13/2013  . Simple endometrial hyperplasia  09/2006   BENIGN SECRETORY 02/2007  . Thyroid dysfunction     Past Surgical History:  Procedure Laterality Date  . [place] DECOMPRESSION  2008  . HYSTEROSCOPY X 2  2007 / 2012   ENDOMETRIAL POLYPS REMOVED    Family Psychiatric History: Mother depression  Family History:  Family History  Problem Relation Age of Onset  . Hypertension Sister   . Lupus Mother   . Hypertension Mother   . Heart disease Father   . Gout Father   . Lupus Father     Social History:  Social  History   Socioeconomic History  . Marital status: Widowed    Spouse name: Not on file  . Number of children: Not on file  . Years of education: Not on file  . Highest education level: Not on file  Occupational History  . Not on file  Tobacco Use  . Smoking status: Current Every Day Smoker    Packs/day: 0.50    Types: Cigarettes  . Smokeless tobacco: Never Used  Vaping Use  . Vaping Use: Never used  Substance and Sexual Activity  . Alcohol use: Yes    Alcohol/week: 0.0 standard drinks    Comment: socially - wine/liquor--Rare  . Drug use: No    Comment: past use "years ago" per patient  . Sexual activity: Yes    Birth control/protection: None    Comment: 1st intercourse 45 yo-More than 5 partners  Other Topics Concern  . Not on file  Social History Narrative  . Not on file   Social Determinants of Health   Financial Resource Strain: Not on file  Food Insecurity: Not on file  Transportation Needs: Not on file  Physical Activity: Not on file  Stress: Not on file  Social Connections: Not on file    Allergies: No Known Allergies  Metabolic Disorder Labs: Lab Results  Component Value Date   HGBA1C 6.9 (A) 01/05/2021   MPG 171.42 03/16/2020   MPG 171.42 03/16/2020   No results found for: PROLACTIN Lab Results  Component Value Date   CHOL 211 (H) 03/16/2020   TRIG 103 03/16/2020   HDL 28 (L) 03/16/2020   CHOLHDL 7.5 03/16/2020   VLDL 21 03/16/2020   LDLCALC 162 (H) 03/16/2020   LDLCALC 175 (H) 06/20/2019   Lab Results  Component Value Date   TSH 1.655 03/14/2020   TSH 3.980 06/20/2019    Therapeutic Level Labs: No results found for: LITHIUM No results found for: VALPROATE No components found for:  CBMZ  Current Medications: Current Outpatient Medications  Medication Sig Dispense Refill  . Accu-Chek FastClix Lancets MISC Use to check CBG TID 100 each 12  . ACCU-CHEK GUIDE test strip USE TO CHECK GBG 3 TIMES A DAY 100 strip 12  . albuterol (PROVENTIL  HFA;VENTOLIN HFA) 108 (90 Base) MCG/ACT inhaler Inhale 2 puffs into the lungs every 6 (six) hours as needed for wheezing or shortness of breath. (Patient not taking: Reported on 08/13/2020) 1 Inhaler 0  . ARIPiprazole (ABILIFY) 5 MG tablet Take 1 tablet (5 mg total) by mouth daily. For mood control 30 tablet 2  . atorvastatin (LIPITOR) 80 MG tablet Take 1 tablet (80 mg total) by mouth daily. 90 tablet 1  . BD ULTRA-FINE PEN NEEDLES 29G X 12.7MM MISC USE THREE TIMES A DAY BEFORE MEALS 1 each 2  . Blood Glucose Monitoring Suppl (ACCU-CHEK GUIDE) w/Device KIT 1 applicator by Does not apply route 3 (three) times daily. Use to check CBG  TID 1 kit 0  . cetirizine (ZYRTEC) 10 MG tablet Take 1 tablet (10 mg total) by mouth daily. (Patient not taking: Reported on 08/13/2020) 30 tablet 11  . empagliflozin (JARDIANCE) 10 MG TABS tablet Take 1 tablet (10 mg total) by mouth daily. 90 tablet 3  . enalapril (VASOTEC) 2.5 MG tablet Take 1 tablet (2.5 mg total) by mouth daily. For high blood pressure 90 tablet 1  . ezetimibe (ZETIA) 10 MG tablet Take 1 tablet (10 mg total) by mouth daily. 90 tablet 1  . levothyroxine (SYNTHROID) 125 MCG tablet Take 1 tablet (125 mcg total) by mouth daily before breakfast. Reported on 02/23/2016 90 tablet 1  . polyethylene glycol powder (GLYCOLAX/MIRALAX) 17 GM/SCOOP powder Take 17 g by mouth daily as needed. (Patient not taking: Reported on 08/13/2020) 3350 g 1  . sertraline (ZOLOFT) 100 MG tablet Take 1 tablet (100 mg total) by mouth daily. For depression 30 tablet 2  . traZODone (DESYREL) 50 MG tablet Take 1 tablet (50 mg total) by mouth at bedtime as needed for sleep. 30 tablet 2   No current facility-administered medications for this visit.     Musculoskeletal: Strength & Muscle Tone: Unable to assess due to telehealth visit [place]: Unable to assess due to telehealth visit Patient leans: N/A  Psychiatric Specialty Exam: Review of Systems  There were no vitals taken  for this visit.There is no height or weight on file to calculate BMI.  General Appearance: Well Groomed  Eye Contact:  Good  Speech:  Clear and Coherent and Normal Rate  Volume:  Normal  Mood:  Euthymic and Notes that she has anxiety and depression however reports she is able to cope with it.  Affect:  Appropriate and Congruent  Thought Process:  Coherent, Goal Directed and Linear  Orientation:  Full (Time, Place, and Person)  Thought Content: WDL and Logical   Suicidal Thoughts:  No  Homicidal Thoughts:  No  Memory:  Immediate;   Good Recent;   Good Remote;   Good  Judgement:  Good  Insight:  Good  Psychomotor Activity:  Normal  Concentration:  Concentration: Good and Attention Span: Good  Recall:  Good  Fund of Knowledge: Good  Language: Good  Akathisia:  No  Handed:  Right  AIMS (if indicated): Not done  Assets:  Communication Skills Desire for Improvement Financial Resources/Insurance Housing Social Support  ADL's:  Intact  Cognition: WNL  Sleep:  Good   Screenings: [place] Admission (Discharged) from 03/15/2020 in [facility]  AIMS Total Score 0    AUDIT   Flowsheet Row Admission (Discharged) from 03/15/2020 in [facility]  Alcohol Use Disorder Identification Test Final Score (AUDIT) 1    GAD-7   Flowsheet Row Video Visit from 02/14/2021 in [facility] Video Visit from 11/15/2020 in [facility]  Total GAD-7 Score 11 5    PHQ2-9   Flowsheet Row Video Visit from 02/14/2021 in [facility] Office Visit from 01/05/2021 in [facility] Video Visit from 11/15/2020 in [facility] Office Visit from 10/15/2020 in [place] Office Visit from 10/04/2020 in [facility]  PHQ-2 Total Score _0 PHQ-9 Total Score _1 Flowsheet Row Video Visit from 02/14/2021 in [facility] Admission (Discharged) from 03/15/2020 in [facility] ED from 03/14/2020 in [facility] DEPT  C-SSRS RISK CATEGORY Error: Q7 should not be populated when Q6 is No High Risk High Risk       Assessment and Plan: Patient notes that she has anxiety and minimal depression.  Provider recommended starting hydroxyzine to help manage anxiety, increasing Zoloft, or starting BuSpar.  Patient notes that although she has anxiety she does not want her medications changed at this time.  Provider endorsed understanding.  No medication changes made today.  Patient agreeable to continue medication as prescribed.  1. Major depressive disorder, single episode, severe with psychotic features ([place])  Reduced- sertraline (ZOLOFT) 100 MG tablet; Take 1 tablet (100 mg total) by mouth daily. For depression  Dispense: 30 tablet; Refill: 2 Continue- traZODone (DESYREL) 50 MG tablet; Take 1 tablet (50 mg total) by mouth at bedtime as needed for sleep.  Dispense: 30 tablet; Refill: 2 Continue- ARIPiprazole (ABILIFY) 5 MG tablet; Take 1 tablet (5 mg total) by mouth daily. For mood control  Dispense: 30 tablet; Refill: 2  Follow-up in 3 months Follow-up with therapy  Salley Slaughter, NP 02/14/2021, 1:14 PM

## 2021-02-16 ENCOUNTER — Telehealth: Payer: Medicaid Other

## 2021-02-18 ENCOUNTER — Telehealth: Payer: Medicaid Other

## 2021-02-21 ENCOUNTER — Ambulatory Visit: Payer: Self-pay

## 2021-02-22 NOTE — Patient Instructions (Signed)
Visit Information  Tanya Nolan  it was nice speaking with you. Please call me directly [phone] if you have questions about the goals we discussed.  Goals Addressed              This Visit's Progress   .  I don't check my blood sugars. (pt-stated)        Timeframe:  Long-Range Goal Priority:  High Start Date:         05/20/20                    Expected End Date:   05/12/21                 Patient Goals/Self Care Activities:   Patient verbalizes understanding of plan Self-administers medications as prescribed  Calls pharmacy for medication refills  Call's provider office for new concerns or questions  check blood sugar at prescribed times  check blood sugar if I feel it is too high or too low  take the blood sugar meter to all doctor visits  Follow up and discuss issues with PCP about checking blood sugars       The patient verbalized understanding of instructions, educational materials, and care plan provided today and declined offer to receive copy of patient instructions, educational materials, and care plan.   Follow up Plan: Patient would like continued follow-up.  CCM RNCM will outreach the patient within the next 5 weeks  Patient will call office if needed prior to next encounter  Lazaro Arms, RN  [phone]

## 2021-02-22 NOTE — Chronic Care Management (AMB) (Signed)
Care Management    RN Visit Note  02/22/2021 Name: SHEKITA BOYDEN MRN: [id number] DOB: 1976/02/12  Subjective: Tanya Nolan is a 45 y.o. year old female who is a primary care patient of [name], MD. The care management team was consulted for assistance with disease management and care coordination needs.    Engaged with patient by telephone for follow up visit in response to provider referral for case management and/or care coordination services.   Consent to Services:   Ms. Faux was given information about Care Management services today including:  1. Care Management services includes personalized support from designated clinical staff supervised by her physician, including individualized plan of care and coordination with other care providers 2. 24/7 contact phone numbers for assistance for urgent and routine care needs. 3. The patient may stop case management services at any time by phone call to the office staff.  Patient agreed to services and consent obtained.    Assessment: Patient continues to experience difficulty with checking her blood sugars... See Care Plan below for interventions and patient self-care actives. Follow up Plan: Patient would like continued follow-up.  CCM RNCM  will outreach the patient within the next 5 weeks.  Patient will call office if needed prior to next encounter  Review of patient past medical history, allergies, medications, health status, including review of consultants reports, laboratory and other test data, was performed as part of comprehensive evaluation and provision of chronic care management services.   SDOH (Social Determinants of Health) assessments and interventions performed:    Care Plan  No Known Allergies  Outpatient Encounter Medications as of 02/21/2021  Medication Sig Note  . Accu-Chek FastClix Lancets MISC Use to check CBG TID   . ACCU-CHEK GUIDE test strip USE TO CHECK GBG 3 TIMES A DAY   . albuterol (PROVENTIL  HFA;VENTOLIN HFA) 108 (90 Base) MCG/ACT inhaler Inhale 2 puffs into the lungs every 6 (six) hours as needed for wheezing or shortness of breath. (Patient not taking: Reported on 08/13/2020) 05/20/2020: Occasionally  . ARIPiprazole (ABILIFY) 5 MG tablet Take 1 tablet (5 mg total) by mouth daily. For mood control   . atorvastatin (LIPITOR) 80 MG tablet Take 1 tablet (80 mg total) by mouth daily.   . BD ULTRA-FINE PEN NEEDLES 29G X 12.7MM MISC USE THREE TIMES A DAY BEFORE MEALS   . Blood Glucose Monitoring Suppl (ACCU-CHEK GUIDE) w/Device KIT 1 applicator by Does not apply route 3 (three) times daily. Use to check CBG TID   . cetirizine (ZYRTEC) 10 MG tablet Take 1 tablet (10 mg total) by mouth daily. (Patient not taking: Reported on 08/13/2020) 05/20/2020: Takes when she needs it  . empagliflozin (JARDIANCE) 10 MG TABS tablet Take 1 tablet (10 mg total) by mouth daily.   . enalapril (VASOTEC) 2.5 MG tablet Take 1 tablet (2.5 mg total) by mouth daily. For high blood pressure   . ezetimibe (ZETIA) 10 MG tablet Take 1 tablet (10 mg total) by mouth daily.   Marland Kitchen levothyroxine (SYNTHROID) 125 MCG tablet Take 1 tablet (125 mcg total) by mouth daily before breakfast. Reported on 02/23/2016   . polyethylene glycol powder (GLYCOLAX/MIRALAX) 17 GM/SCOOP powder Take 17 g by mouth daily as needed. (Patient not taking: Reported on 08/13/2020)   . sertraline (ZOLOFT) 100 MG tablet Take 1 tablet (100 mg total) by mouth daily. For depression   . traZODone (DESYREL) 50 MG tablet Take 1 tablet (50 mg total) by mouth at bedtime  as needed for sleep.    No facility-administered encounter medications on file as of 02/21/2021.    Patient Active Problem List   Diagnosis Date Noted  . Incontinence in female 10/05/2020  . Major depressive disorder, single episode, severe with psychotic features ([place]) 08/13/2020  . Grief 11/10/2019  . Tobacco abuse 07/15/2019  . Chronic back pain 09/19/2016  . Hemorrhoids 02/02/2012  .  Hypothyroidism 01/10/2007  . Type 2 diabetes mellitus with hyperlipidemia ([place]) 01/10/2007  . POLYCYSTIC OVARY 01/10/2007  . Hyperlipidemia 01/10/2007  . HYPERTENSION, BENIGN SYSTEMIC 01/10/2007    Conditions to be addressed/monitored: DMII  Care Plan : RN Case Manager  Updates made by Lazaro Arms, RN since 02/22/2021 12:00 AM  Problem: Glycemic Management (Diabetes, Type 2)   Long-Range Goal: Glycemic Management   Start Date: 10/01/2020  Expected End Date: 03/12/2021  Recent Progress: Not on track  Priority: Medium  Note:   Objective:  Lab Results  Component Value Date   HGBA1C 6.9 (A) 01/05/2021 .   Lab Results  Component Value Date   CREATININE 0.74 03/23/2020   CREATININE 0.86 03/14/2020   CREATININE 0.70 06/20/2019   Current Barriers:  Marland Kitchen Knowledge Deficits related to basic Diabetes pathophysiology and self care/management . Does not use cbg meter   Case Manager Clinical Goal(s):  Over the next 90 days, patient will demonstrate improved adherence to prescribed treatment plan for diabetes self care/management as evidenced by: by lowering her a1c by 1-2 points . daily monitoring and recording of CBG   Interventions:  . Provided education to patient about basic DM disease process . Discussed plans with patient for ongoing care management follow up and provided patient with direct contact information for care management team . Advised patient, providing education and rationale, to check cbg  and record, calling the office  for findings outside established parameters.   . Review of patient status, including review of consultants reports, relevant laboratory and other test results, and medications completed. . barriers to adherence to treatment plan identified- patient is still  not checking her blood sugars.  She states that she is doing fine no signs or symptoms but she does not have stirps advised her to call the pharmacy and request test strips.  She states that she would  like to stay in the program for followup . Blood glucose monitoring encouraged . use of blood glucose monitoring log promoted . Discussed healthy eating  Patient Goals/Self Care Activities:   Patient verbalizes understanding of plan Self-administers medications as prescribed  Calls pharmacy for medication refills  Call's provider office for new concerns or questions  check blood sugar at prescribed times  check blood sugar if I feel it is too high or too low  take the blood sugar meter to all doctor visits  Follow up and discuss issues with PCP about checking blood sugars        Lazaro Arms RN, BSN, [place] Phone: [phone] I Fax: [phone]

## 2021-03-25 ENCOUNTER — Telehealth: Payer: Medicaid Other

## 2021-03-25 ENCOUNTER — Telehealth: Payer: Self-pay

## 2021-03-25 NOTE — Telephone Encounter (Signed)
   RN Case Manager Care Management   Phone Outreach    03/25/2021 Name: TIMAYA BOJARSKI MRN: [id number] DOB: January 01, 1976  BRIANNE MAINA is a 45 y.o. year old female who is a primary care patient of [place], Phill Myron, MD .   Telephone outreach was unsuccessful A HIPPA compliant phone message was left for the patient providing contact information and requesting a return call.   Plan:Appointment was rescheduled with CCM  RNCM on 03/29/21  Review of patient status, including review of consultants reports, relevant laboratory and other test results, and collaboration with appropriate care team members and the patient's provider was performed as part of comprehensive patient evaluation and provision of care management services.    Lazaro Arms RN, BSN, [facility] Care Management Coordinator [place] Phone: [phone] I Fax: [phone]

## 2021-03-29 ENCOUNTER — Ambulatory Visit: Payer: Medicaid Other

## 2021-03-29 NOTE — Patient Instructions (Signed)
Visit Information  Ms. Leugers  it was nice speaking with you. Please call me directly [phone] if you have questions about the goals we discussed.  Goals Addressed              This Visit's Progress   .  I don't check my blood sugars. (pt-stated)        Timeframe:  Long-Range Goal Priority:  High Start Date:         05/20/20                    Expected End Date:   05/12/21                Patient Goals/Self Care Activities:   Patient verbalizes understanding of plan Self-administers medications as prescribed  Calls pharmacy for medication refills  Call's provider office for new concerns or questions  check blood sugar at prescribed times       The patient verbalized understanding of instructions, educational materials, and care plan provided today and declined offer to receive copy of patient instructions, educational materials, and care plan.   Follow up Plan: Patient would like continued follow-up.  CCM RNCM will outreach the patient within the next month of June.  Patient will call office if needed prior to next encounter  Lazaro Arms, RN  [phone]

## 2021-03-29 NOTE — Chronic Care Management (AMB) (Signed)
Care Management    RN Visit Note  03/29/2021 Name: Tanya Nolan MRN: [id number] DOB: [birth year]  Subjective: Tanya Nolan is a 45 y.o. year old female who is a primary care patient of [name], MD. The care management team was consulted for assistance with disease management and care coordination needs.    Engaged with patient by telephone for follow up visit in response to provider referral for case management and/or care coordination services.   The patient was given information about Chronic Care Management services, agreed to services, and gave verbal consent prior to initiation of services.  Please see initial visit note for detailed documentation.    Patient agreed to services and consent obtained.    Assessment: The patient continues to not check her blood sugars.. See Care Plan below for interventions and patient self-care actives. Follow up Plan: Patient would like continued follow-up.  CCM RNCM will outreach the patient within the next month of June  Patient will call office if needed prior to next encounter  Review of patient past medical history, allergies, medications, health status, including review of consultants reports, laboratory and other test data, was performed as part of comprehensive evaluation and provision of chronic care management services.   SDOH (Social Determinants of Health) assessments and interventions performed:    Care Plan  No Known Allergies  Outpatient Encounter Medications as of 03/29/2021  Medication Sig Note  . Accu-Chek FastClix Lancets MISC Use to check CBG TID   . ACCU-CHEK GUIDE test strip USE TO CHECK GBG 3 TIMES A DAY   . albuterol (PROVENTIL HFA;VENTOLIN HFA) 108 (90 Base) MCG/ACT inhaler Inhale 2 puffs into the lungs every 6 (six) hours as needed for wheezing or shortness of breath. (Patient not taking: Reported on 08/13/2020) 05/20/2020: Occasionally  . ARIPiprazole (ABILIFY) 5 MG tablet Take 1 tablet (5 mg total) by mouth  daily. For mood control   . atorvastatin (LIPITOR) 80 MG tablet Take 1 tablet (80 mg total) by mouth daily.   . BD ULTRA-FINE PEN NEEDLES 29G X 12.7MM MISC USE THREE TIMES A DAY BEFORE MEALS   . Blood Glucose Monitoring Suppl (ACCU-CHEK GUIDE) w/Device KIT 1 applicator by Does not apply route 3 (three) times daily. Use to check CBG TID   . cetirizine (ZYRTEC) 10 MG tablet Take 1 tablet (10 mg total) by mouth daily. (Patient not taking: Reported on 08/13/2020) 05/20/2020: Takes when she needs it  . empagliflozin (JARDIANCE) 10 MG TABS tablet Take 1 tablet (10 mg total) by mouth daily.   . enalapril (VASOTEC) 2.5 MG tablet Take 1 tablet (2.5 mg total) by mouth daily. For high blood pressure   . ezetimibe (ZETIA) 10 MG tablet Take 1 tablet (10 mg total) by mouth daily.   Marland Kitchen levothyroxine (SYNTHROID) 125 MCG tablet Take 1 tablet (125 mcg total) by mouth daily before breakfast. Reported on 02/23/2016   . polyethylene glycol powder (GLYCOLAX/MIRALAX) 17 GM/SCOOP powder Take 17 g by mouth daily as needed. (Patient not taking: Reported on 08/13/2020)   . sertraline (ZOLOFT) 100 MG tablet Take 1 tablet (100 mg total) by mouth daily. For depression   . traZODone (DESYREL) 50 MG tablet Take 1 tablet (50 mg total) by mouth at bedtime as needed for sleep.   . [DISCONTINUED] insulin lispro protamine-insulin lispro (HUMALOG 75/25) (75-25) 100 UNIT/ML SUSP Inject 40-50 Units into the skin 2 (two) times daily with a meal. Patient takes 40 units in the morning and 50 units at  night.  Verified it is Humalog 75/25. 11/02/2011: Took 5 units as order by anesthesia at 1115  11/02/2011   No facility-administered encounter medications on file as of 03/29/2021.    Patient Active Problem List   Diagnosis Date Noted  . Incontinence in female 10/05/2020  . Major depressive disorder, single episode, severe with psychotic features ([place]) 08/13/2020  . Grief 11/10/2019  . Tobacco abuse 07/15/2019  . Chronic back pain 09/19/2016  .  Hemorrhoids 02/02/2012  . Hypothyroidism 01/10/2007  . Type 2 diabetes mellitus with hyperlipidemia ([place]) 01/10/2007  . POLYCYSTIC OVARY 01/10/2007  . Hyperlipidemia 01/10/2007  . HYPERTENSION, BENIGN SYSTEMIC 01/10/2007    Conditions to be addressed/monitored: DMII  Care Plan : RN Case Manager  Updates made by Lazaro Arms, RN since 03/29/2021 12:00 AM    Problem: Glycemic Management (Diabetes, Type 2)     Long-Range Goal: Glycemic Management   Start Date: 10/01/2020  Expected End Date: 03/12/2021  Recent Progress: Not on track  Priority: Medium  Note:   Objective:  Lab Results  Component Value Date   HGBA1C 6.9 (A) 01/05/2021 .   Lab Results  Component Value Date   CREATININE 0.74 03/23/2020   CREATININE 0.86 03/14/2020   CREATININE 0.70 06/20/2019   Current Barriers:  Marland Kitchen Knowledge Deficits related to basic Diabetes pathophysiology and self care/management . Does not use cbg meter   Case Manager Clinical Goal(s):  Over the next 90 days, patient will demonstrate improved adherence to prescribed treatment plan for diabetes self care/management as evidenced by: by lowering her a1c by 1-2 points . daily monitoring and recording of CBG   Interventions:  . Provided education to patient about basic DM disease process . Discussed plans with patient for ongoing care management follow up and provided patient with direct contact information for care management team . Advised patient, providing education and rationale, to check cbg  and record, calling the office  for findings outside established parameters.   . Review of patient status, including review of consultants reports, relevant laboratory and other test results, and medications completed. . barriers to adherence to treatment plan identified- patient is still  not checking her blood sugars.  She states that she is doing fine no signs or symptoms  . Blood glucose monitoring encouraged . use of blood glucose monitoring log  promoted . Discussed healthy eating  Patient Goals/Self Care Activities:   Patient verbalizes understanding of plan Self-administers medications as prescribed  Calls pharmacy for medication refills  Call's provider office for new concerns or questions  check blood sugar at prescribed times  check blood sugar if I feel it is too high or too low  take the blood sugar meter to all doctor visits  Follow up and discuss issues with PCP about checking blood sugars        Lazaro Arms RN, BSN, [place] Phone: [phone] I Fax: [phone]

## 2021-04-13 ENCOUNTER — Other Ambulatory Visit: Payer: Self-pay

## 2021-04-13 ENCOUNTER — Encounter: Payer: Self-pay | Admitting: Family Medicine

## 2021-04-13 ENCOUNTER — Ambulatory Visit (INDEPENDENT_AMBULATORY_CARE_PROVIDER_SITE_OTHER): Payer: Medicaid Other | Admitting: Family Medicine

## 2021-04-13 VITALS — BP 152/70 | HR 65 | Ht 66.0 in | Wt 225.2 lb

## 2021-04-13 DIAGNOSIS — R059 Cough, unspecified: Secondary | ICD-10-CM | POA: Diagnosis not present

## 2021-04-13 DIAGNOSIS — R2242 Localized swelling, mass and lump, left lower limb: Secondary | ICD-10-CM | POA: Diagnosis not present

## 2021-04-13 DIAGNOSIS — L249 Irritant contact dermatitis, unspecified cause: Secondary | ICD-10-CM | POA: Diagnosis not present

## 2021-04-13 MED ORDER — CETIRIZINE HCL 10 MG PO TABS: 10.0000 mg | ORAL_TABLET | Freq: Every day | ORAL | 11 refills | Status: DC

## 2021-04-13 MED ORDER — LEVOTHYROXINE SODIUM 125 MCG PO TABS: 125.0000 ug | ORAL_TABLET | Freq: Every day | ORAL | 0 refills | Status: DC

## 2021-04-13 MED ORDER — HYDROCORTISONE 2.5 % EX OINT: TOPICAL_OINTMENT | Freq: Two times a day (BID) | CUTANEOUS | 0 refills | Status: DC

## 2021-04-13 MED ORDER — ACCU-CHEK GUIDE VI STRP: ORAL_STRIP | 1 refills | Status: AC

## 2021-04-13 NOTE — Progress Notes (Signed)
    SUBJECTIVE:   CHIEF COMPLAINT / HPI: rash  Has been present less than a week  No difference with triamcinolone  Pruritis  No pain No breaks in skin  No changes to grooming process or products, denies being involved outside and hitting plants  No fevers or chills  No household contacts with similar rash   Left foot nodule  Patient thinks she stepeed on an object,unsure when this injury occurred  Area is tender when applying direct pressure to the area No bleeding  Color has been darkening Patient concerned bc of her DM hx     PERTINENT  PMH / PSH:  DM    OBJECTIVE:   BP (!) 152/70   Pulse 65   Ht 5\' 6"  (1.676 m)   Wt 225 lb 3.2 oz (102.2 kg)   LMP 04/11/2021 (Approximate)   SpO2 98%   BMI 36.35 kg/m    Media Information         Document Information  Photos  Foot hyperpigmentation   04/13/2021 13:47  Attached To:  Office Visit on 04/13/21 with Eulis Foster, MD   Source Information  Eulis Foster, MD  [facility]     Media Information         Document Information  Photos  Rash  04/13/2021 13:46  Attached To:  Office Visit on 04/13/21 with Eulis Foster, MD   Source Information  Simmons-Robinson, Riki Sheer, MD  [facility]     ASSESSMENT/PLAN:   Contact dermatitis Patient's rash morphology including linear nature with pruritis is most consistent with some unknown trigger and contact dermatitis.  Treat with 2.5% cortisone topical  Patient to follow up in 2 weeks to check on rash improvement  Rx cetirizine to help with pruritis   Nodule of skin of left foot Hyperpigmented nodule on sole of left foot. Given patient's hx of MD and worsening color of nodule with questionable hx of stepping on sharp object and hx of neuropathy, will send for xray of foot to see if object is visualized  Area of nodule does not appear to be infected as of now, pt has no fevers Will also refer to  podiatry      Eulis Foster, MD [place]

## 2021-04-13 NOTE — Patient Instructions (Addendum)
It was a pleasure to see you today!  Thank you for choosing [facility] for your primary care.   Tanya Nolan was seen for rash and foot injury .   Our plans for today were:  I will prescribe a mild steroid cream to apply to your rash twice a day. Please also take the Zyrtec (cetirizine) daily to help with the itching. I believe this is likely a rash due to contact dermatitis.   I have also refilled your medications as requested.   We will also place a referral to podiatry to assess your foot and recommend any further treatment for your injury. Please continue to monitor the area for any changes.    You should return to our clinic in 1 week for follow up for foot and rash.   Best Wishes,   Dr. Alba Cory     Contact Dermatitis Dermatitis is redness, soreness, and swelling (inflammation) of the skin. Contact dermatitis is a reaction to something that touches the skin. There are two types of contact dermatitis:  Irritant contact dermatitis. This happens when something bothers (irritates) your skin, like soap.  Allergic contact dermatitis. This is caused when you are exposed to something that you are allergic to, such as poison ivy. What are the causes?  Common causes of irritant contact dermatitis include: ? Makeup. ? Soaps. ? Detergents. ? Bleaches. ? Acids. ? Metals, such as nickel.  Common causes of allergic contact dermatitis include: ? Plants. ? Chemicals. ? Jewelry. ? Latex. ? Medicines. ? Preservatives in products, such as clothing. What increases the risk?  Having a job that exposes you to things that bother your skin.  Having asthma or eczema. What are the signs or symptoms? Symptoms may happen anywhere the irritant has touched your skin. Symptoms include:  Dry or flaky skin.  Redness.  Cracks.  Itching.  Pain or a burning feeling.  Blisters.  Blood or clear fluid draining from skin cracks. With allergic contact dermatitis,  swelling may occur. This may happen in places such as the eyelids, mouth, or genitals.   How is this treated?  This condition is treated by checking for the cause of the reaction and protecting your skin. Treatment may also include: ? Steroid creams, ointments, or medicines. ? Antibiotic medicines or other ointments, if you have a skin infection. ? Lotion or medicines to help with itching. ? A bandage (dressing). Follow these instructions at home: Skin care  Moisturize your skin as needed.  Put cool cloths on your skin.  Put a baking soda paste on your skin. Stir water into baking soda until it looks like a paste.  Do not scratch your skin.  Avoid having things rub up against your skin.  Avoid the use of soaps, perfumes, and dyes. Medicines  Take or apply over-the-counter and prescription medicines only as told by your doctor.  If you were prescribed an antibiotic medicine, take or apply it as told by your doctor. Do not stop using it even if your condition starts to get better. Bathing  Take a bath with: ? Epsom salts. ? Baking soda. ? Colloidal oatmeal.  Bathe less often.  Bathe in warm water. Avoid using hot water. Bandage care  If you were given a bandage, change it as told by your health care provider.  Wash your hands with soap and water before and after you change your bandage. If soap and water are not available, use hand sanitizer. General instructions  Avoid the things  that caused your reaction. If you do not know what caused it, keep a journal. Write down: ? What you eat. ? What skin products you use. ? What you drink. ? What you wear in the area that has symptoms. This includes jewelry.  Check the affected areas every day for signs of infection. Check for: ? More redness, swelling, or pain. ? More fluid or blood. ? Warmth. ? Pus or a bad smell.  Keep all follow-up visits as told by your doctor. This is important. Contact a doctor if:  You do not  get better with treatment.  Your condition gets worse.  You have signs of infection, such as: ? More swelling. ? Tenderness. ? More redness. ? Soreness. ? Warmth.  You have a fever.  You have new symptoms. Get help right away if:  You have a very bad headache.  You have neck pain.  Your neck is stiff.  You throw up (vomit).  You feel very sleepy.  You see red streaks coming from the area.  Your bone or joint near the area hurts after the skin has healed.  The area turns darker.  You have trouble breathing. Summary  Dermatitis is redness, soreness, and swelling of the skin.  Symptoms may occur where the irritant has touched you.  Treatment may include medicines and skin care.  If you do not know what caused your reaction, keep a journal.  Contact a doctor if your condition gets worse or you have signs of infection. This information is not intended to replace advice given to you by your health care provider. Make sure you discuss any questions you have with your health care provider. Document Revised: 02/19/2019 Document Reviewed: 05/15/2018 Elsevier Patient Education  [place].

## 2021-04-15 ENCOUNTER — Ambulatory Visit
Admission: RE | Admit: 2021-04-15 | Discharge: 2021-04-15 | Disposition: A | Discharge status: Home / Self Care | Payer: Medicaid Other | Source: Ambulatory Visit | Attending: Family Medicine | Admitting: Family Medicine

## 2021-04-15 DIAGNOSIS — R2242 Localized swelling, mass and lump, left lower limb: Secondary | ICD-10-CM

## 2021-04-15 DIAGNOSIS — L259 Unspecified contact dermatitis, unspecified cause: Secondary | ICD-10-CM | POA: Insufficient documentation

## 2021-04-15 HISTORY — DX: Localized swelling, mass and lump, left lower limb: R22.42

## 2021-04-15 NOTE — Assessment & Plan Note (Addendum)
Patient's rash morphology including linear nature with pruritis is most consistent with some unknown trigger and contact dermatitis.  Treat with 2.5% cortisone topical  Patient to follow up in 2 weeks to check on rash improvement  Rx cetirizine to help with pruritis

## 2021-04-15 NOTE — Assessment & Plan Note (Signed)
Hyperpigmented nodule on sole of left foot. Given patient's hx of MD and worsening color of nodule with questionable hx of stepping on sharp object and hx of neuropathy, will send for xray of foot to see if object is visualized  Area of nodule does not appear to be infected as of now, pt has no fevers Will also refer to podiatry

## 2021-04-18 ENCOUNTER — Ambulatory Visit: Payer: Medicaid Other | Admitting: Podiatry

## 2021-04-20 ENCOUNTER — Encounter: Payer: Self-pay | Admitting: Podiatry

## 2021-04-20 ENCOUNTER — Ambulatory Visit (INDEPENDENT_AMBULATORY_CARE_PROVIDER_SITE_OTHER): Payer: Medicaid Other | Admitting: Podiatry

## 2021-04-20 ENCOUNTER — Other Ambulatory Visit: Payer: Self-pay

## 2021-04-20 ENCOUNTER — Ambulatory Visit (INDEPENDENT_AMBULATORY_CARE_PROVIDER_SITE_OTHER): Payer: Medicaid Other

## 2021-04-20 ENCOUNTER — Other Ambulatory Visit: Payer: Self-pay | Admitting: Podiatry

## 2021-04-20 DIAGNOSIS — M79672 Pain in left foot: Secondary | ICD-10-CM

## 2021-04-20 DIAGNOSIS — L921 Necrobiosis lipoidica, not elsewhere classified: Secondary | ICD-10-CM

## 2021-04-20 DIAGNOSIS — L923 Foreign body granuloma of the skin and subcutaneous tissue: Secondary | ICD-10-CM

## 2021-04-20 NOTE — Progress Notes (Signed)
Subjective:   Patient ID: Tanya Nolan, female   DOB: 45 y.o.   MRN: [id number]   HPI Patient states she may have stepped on something and has an area of irritation on the bottom of the left foot and is a diabetic but is under much better control with her A1c below 8.  Patient does smoke a half a pack per day and tries to stay active   Review of Systems  All other systems reviewed and are negative.       Objective:  Physical Exam Vitals and nursing note reviewed.  Constitutional:      Appearance: She is well-developed.  Pulmonary:     Effort: Pulmonary effort is normal.  Musculoskeletal:        General: Normal range of motion.  Skin:    General: Skin is warm.  Neurological:     Mental Status: She is alert.     Neurovascular status was found to be intact with sharp dull vibratory noted to be mildly diminished but intact.  Patient is found to have a small area on the left plantar first metatarsal around the head that is localized and irritated and no proximal edema erythema or drainage was noted currently     Assessment:  Possibility that this may be a foreign body versus wart or other unknown process     Plan:  H&P discussed different treatment options and sterile debridement of the area after proper anesthesia and sterile prep..  I did take out a small bit what appears to be as foreign material but difficult to identify as to what it was localized I flushed the area applied sterile dressing gave instructions for watching this on a daily basis and any redness swelling or any issues were to occur patient is to reappoint immediately and contact

## 2021-05-11 ENCOUNTER — Telehealth: Payer: Medicaid Other

## 2021-05-11 ENCOUNTER — Other Ambulatory Visit: Payer: Self-pay

## 2021-05-11 ENCOUNTER — Ambulatory Visit: Payer: Self-pay | Admitting: Licensed Clinical Social Worker

## 2021-05-11 ENCOUNTER — Encounter (HOSPITAL_COMMUNITY): Payer: Self-pay | Admitting: Psychiatry

## 2021-05-11 ENCOUNTER — Telehealth (INDEPENDENT_AMBULATORY_CARE_PROVIDER_SITE_OTHER): Payer: Medicaid Other | Admitting: Psychiatry

## 2021-05-11 DIAGNOSIS — F323 Major depressive disorder, single episode, severe with psychotic features: Secondary | ICD-10-CM | POA: Diagnosis not present

## 2021-05-11 DIAGNOSIS — F4321 Adjustment disorder with depressed mood: Secondary | ICD-10-CM | POA: Diagnosis not present

## 2021-05-11 MED ORDER — TRAZODONE HCL 50 MG PO TABS
50.0000 mg | ORAL_TABLET | Freq: Every evening | ORAL | 2 refills | Status: DC | PRN
Start: 2021-05-11 — End: 2021-07-14

## 2021-05-11 MED ORDER — SERTRALINE HCL 100 MG PO TABS
100.0000 mg | ORAL_TABLET | Freq: Every day | ORAL | 2 refills | Status: DC
Start: 2021-05-11 — End: 2021-07-14

## 2021-05-11 MED ORDER — ARIPIPRAZOLE 5 MG PO TABS: 5.0000 mg | ORAL_TABLET | Freq: Every day | ORAL | 2 refills | Status: DC

## 2021-05-11 NOTE — Progress Notes (Signed)
[place] MD/PA/NP OP Progress Note Virtual Visit via Video Note  I connected with Tanya Nolan on 05/11/21 at  1:00 PM EDT by a video enabled telemedicine application and verified that I am speaking with the correct person using two identifiers.  Location: Patient: Home Provider: Clinic   I discussed the limitations of evaluation and management by telemedicine and the availability of in person appointments. The patient expressed understanding and agreed to proceed.  I provided 30 minutes of non-face-to-face time during this encounter.      05/11/2021 1:27 PM NANDINI BOGDANSKI  MRN:  [id number]  Chief Complaint: "I miss my husband"  HPI: 45 year old female seen today for follow up psychiatric evaluation.  She has a history of major depression.  She is currently being managed on Zoloft 100 mg daily, trazodone 50 mg nightly and Abilify 5 mg daily.  She notes her medications are somewhat effective in managing her psychiatric conditions.   Today patient was tearful and somewhat engaged in conversation.  Patient sobbed throughout the exam.  She was unable to calm herself down.  She informed provider that she misses her husband who passed away in 01-27-2019.  She notes that for the last week she has been crying more and thinking about him.  She informed Probation officer that the anniversary is coming up in September.  She notes that most days she just wants to stay in bed.  She informed Probation officer that she pushes herself to go to work however notes that she really does not want to go.  Provider unable to conduct a PHQ-9 and a GAD-7 today.  She denies SI/HI/VAH, mania, or paranoia.  She notes that since her last visit her appetite has been poor and notes that she has lost approximately 10 pounds.    Patient notes that recently she had a rash on her left foot that was painful.  She notes that she was seen by a doctor and notes that she no longer has pain.  Provider asked patient if she would be interested in starting therapy.   She notes that she is used to handling things along with however was agreeable to potentially trying it.  Patient scheduled with a therapist in August.  At this time she request that her medications not be changed.  She will follow-up with provider in 3 months.  No other concerns at this time.      Visit Diagnosis:    ICD-10-CM   1. Grief  F43.21     2. Major depressive disorder, single episode, severe with psychotic features ([place])  F32.3 ARIPiprazole (ABILIFY) 5 MG tablet    sertraline (ZOLOFT) 100 MG tablet    traZODone (DESYREL) 50 MG tablet      Past Psychiatric History:  depression   Past Medical History:  Past Medical History:  Diagnosis Date   Abdominal pain 03/26/2009   Qualifier: Diagnosis of  By: Jerline Pain MD, [place], LUMBAR 03/26/2009   Qualifier: Diagnosis of  By: Jerline Pain MD, Anderson [place]     [place] 10/01/2007   Qualifier: Diagnosis of  By: Genene Churn MD, Jessica     Chronic back pain    tx with ibuprofen   Diabetes mellitus    Hypercholesteremia    Hyperlipidemia    no meds - tx with diet   Hypertension    Hypothyroidism    Ingrown toenail 12/25/2014   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   C&B WNL  NEG  PAPS after   MDD (major depressive disorder), recurrent severe, without psychosis ([place]) 03/15/2020   Nipple discharge 10/13/2013   Non compliance w medication regimen 10/13/2013   Simple endometrial hyperplasia 09/2006   BENIGN SECRETORY 02/2007   Thyroid dysfunction     Past Surgical History:  Procedure Laterality Date   [place] DECOMPRESSION  2008   HYSTEROSCOPY X 2  2007 / 2012   ENDOMETRIAL POLYPS REMOVED    Family Psychiatric History: Mother depression  Family History:  Family History  Problem Relation Age of Onset   Hypertension Sister    Lupus Mother    Hypertension Mother    Heart disease Father    Gout Father    Lupus Father     Social History:  Social History   Socioeconomic History   Marital  status: Widowed    Spouse name: Not on file   Number of children: Not on file   Years of education: Not on file   Highest education level: Not on file  Occupational History   Not on file  Tobacco Use   Smoking status: Every Day    Packs/day: 0.50    Pack years: 0.00    Types: Cigarettes   Smokeless tobacco: Never  Vaping Use   Vaping Use: Never used  Substance and Sexual Activity   Alcohol use: Yes    Alcohol/week: 0.0 standard drinks    Comment: socially - wine/liquor--Rare   Drug use: No    Comment: past use "years ago" per patient   Sexual activity: Yes    Birth control/protection: None    Comment: 1st intercourse 45 yo-More than 5 partners  Other Topics Concern   Not on file  Social History Narrative   Not on file   Social Determinants of Health   Financial Resource Strain: Not on file  Food Insecurity: Not on file  Transportation Needs: Not on file  Physical Activity: Not on file  Stress: Not on file  Social Connections: Not on file    Allergies: No Known Allergies  Metabolic Disorder Labs: Lab Results  Component Value Date   HGBA1C 6.9 (A) 01/05/2021   MPG 171.42 03/16/2020   MPG 171.42 03/16/2020   No results found for: PROLACTIN Lab Results  Component Value Date   CHOL 211 (H) 03/16/2020   TRIG 103 03/16/2020   HDL 28 (L) 03/16/2020   CHOLHDL 7.5 03/16/2020   VLDL 21 03/16/2020   LDLCALC 162 (H) 03/16/2020   LDLCALC 175 (H) 06/20/2019   Lab Results  Component Value Date   TSH 1.655 03/14/2020   TSH 3.980 06/20/2019    Therapeutic Level Labs: No results found for: LITHIUM No results found for: VALPROATE No components found for:  CBMZ  Current Medications: Current Outpatient Medications  Medication Sig Dispense Refill   Accu-Chek FastClix Lancets MISC Use to check CBG TID 100 each 12   albuterol (PROVENTIL HFA;VENTOLIN HFA) 108 (90 Base) MCG/ACT inhaler Inhale 2 puffs into the lungs every 6 (six) hours as needed for wheezing or shortness  of breath. (Patient not taking: Reported on 08/13/2020) 1 Inhaler 0   ARIPiprazole (ABILIFY) 5 MG tablet Take 1 tablet (5 mg total) by mouth daily. For mood control 30 tablet 2   atorvastatin (LIPITOR) 80 MG tablet Take 1 tablet (80 mg total) by mouth daily. 90 tablet 1   BD ULTRA-FINE PEN NEEDLES 29G X 12.7MM MISC USE THREE TIMES A DAY BEFORE MEALS 1 each 2   Blood Glucose Monitoring Suppl (ACCU-CHEK  GUIDE) w/Device KIT 1 applicator by Does not apply route 3 (three) times daily. Use to check CBG TID 1 kit 0   cetirizine (ZYRTEC) 10 MG tablet Take 1 tablet (10 mg total) by mouth daily. 30 tablet 11   empagliflozin (JARDIANCE) 10 MG TABS tablet Take 1 tablet (10 mg total) by mouth daily. 90 tablet 3   enalapril (VASOTEC) 2.5 MG tablet Take 1 tablet (2.5 mg total) by mouth daily. For high blood pressure 90 tablet 1   ezetimibe (ZETIA) 10 MG tablet Take 1 tablet (10 mg total) by mouth daily. 90 tablet 1   glucose blood (ACCU-CHEK GUIDE) test strip USE TO CHECK GBG 3 TIMES A DAY 100 strip 1   hydrocortisone 2.5 % ointment Apply topically 2 (two) times daily. Limit use to one week 30 g 0   levothyroxine (SYNTHROID) 125 MCG tablet Take 1 tablet (125 mcg total) by mouth daily before breakfast. Reported on 02/23/2016 90 tablet 0   polyethylene glycol powder (GLYCOLAX/MIRALAX) 17 GM/SCOOP powder Take 17 g by mouth daily as needed. (Patient not taking: Reported on 08/13/2020) 3350 g 1   sertraline (ZOLOFT) 100 MG tablet Take 1 tablet (100 mg total) by mouth daily. For depression 30 tablet 2   traZODone (DESYREL) 50 MG tablet Take 1 tablet (50 mg total) by mouth at bedtime as needed for sleep. 30 tablet 2   No current facility-administered medications for this visit.     Musculoskeletal: Strength & Muscle Tone:  Unable to assess due to telehealth visit [place]:  Unable to assess due to telehealth visit Patient leans: N/A  Psychiatric Specialty Exam: Review of Systems  Last menstrual period  04/11/2021.There is no height or weight on file to calculate BMI.  General Appearance: Well Groomed  Eye Contact:  Fair  Speech:  Clear and Coherent and Slow  Volume:  Decreased  Mood:  Anxious and Depressed  Affect:  Appropriate, Congruent, and Tearful  Thought Process:  Coherent, Goal Directed and Linear  Orientation:  Full (Time, Place, and Person)  Thought Content: WDL and Logical   Suicidal Thoughts:  No  Homicidal Thoughts:  No  Memory:  Immediate;   Good Recent;   Good Remote;   Good  Judgement:  Good  Insight:  Good  Psychomotor Activity:  Normal  Concentration:  Concentration: Good and Attention Span: Good  Recall:  Good  Fund of Knowledge: Good  Language: Good  Akathisia:  No  Handed:  Right  AIMS (if indicated): Not done  Assets:  Communication Skills Desire for Improvement Financial Resources/Insurance Housing Social Support  ADL's:  Intact  Cognition: WNL  Sleep:  Good   Screenings: [facility] Admission (Discharged) from 03/15/2020 in [facility]  AIMS Total Score 0      AUDIT    Flowsheet Row Admission (Discharged) from 03/15/2020 in [facility]  Alcohol Use Disorder Identification Test Final Score (AUDIT) 1      GAD-7    Flowsheet Row Video Visit from 02/14/2021 in [facility] Video Visit from 11/15/2020 in [facility]  Total GAD-7 Score 11 5      PHQ2-9    Flowsheet Row Video Visit from 02/14/2021 in [facility] Office Visit from 01/05/2021 in [facility] Video Visit from 11/15/2020 in [facility] Office Visit from 10/15/2020 in [facility] Office Visit from 10/04/2020 in [facility]  PHQ-2 Total Score '3 1 2 2 3  ' PHQ-9 Total Score '7 5 3 4 10      ' Flowsheet Row Video Visit from 02/14/2021 in  [facility] Admission (Discharged) from 03/15/2020 in [facility] ED from 03/14/2020 in [facility] DEPT  C-SSRS RISK CATEGORY Error: Q7 should not be populated when Q6 is No High Risk High Risk        Assessment and Plan: Patient notes that she grieves the loss of her husband.  She notes that her grief worsens her anxiety and depression.  She endorses anhedonia, depressed mood, psychomotor agitation, irritability.  This time however she does not want her medications adjusted.  She is agreeable to following up with a therapist for counseling. 1. Grief  - Ambulatory referral to Social Work  2. Major depressive disorder, single episode, severe with psychotic features ([place])  Reduced- sertraline (ZOLOFT) 100 MG tablet; Take 1 tablet (100 mg total) by mouth daily. For depression  Dispense: 30 tablet; Refill: 2 Continue- traZODone (DESYREL) 50 MG tablet; Take 1 tablet (50 mg total) by mouth at bedtime as needed for sleep.  Dispense: 30 tablet; Refill: 2 Continue- ARIPiprazole (ABILIFY) 5 MG tablet; Take 1 tablet (5 mg total) by mouth daily. For mood control  Dispense: 30 tablet; Refill: 2  Follow-up in 3 months Follow-up with therapy  Salley Slaughter, NP 05/11/2021, 1:27 PM

## 2021-05-11 NOTE — Patient Instructions (Signed)
Your phone appointment with Tanya Nolan has been rescheduled for   July 25th at 11:00   Casimer Lanius, [place]

## 2021-05-11 NOTE — Chronic Care Management (AMB) (Signed)
    Clinical Social Work  Care Management   Phone Outreach    05/11/2021 Name: Tanya Nolan MRN: [id number] DOB: [birth year]  Tanya Nolan is a 45 y.o. year old female who is a primary care patient of Eniola, Phill Myron, MD .   CCM LCSW providing coverage for CCM RN.  F/U phone appointment scheduled with CCM RN today to assess needs, and progress with care plan goals.   Patient reports no immediate needs or concerns for CCM RN today.  Reminded patient of upcoming appointments.   Patient upset and started to cry reports she is not having a good day.  She has appointment with therapist today at 1:00. interventions provided: Stayed on phone with patient to calm her down, demonstration of relaxed breathing,  Emotional Supportive Provided and Suicidal Ideation/Homicidal Ideation assessed: patient denies.  She is home with family and has support.   Plan:Appointment was rescheduled with CCM RN on Monday July 25th . Will call office if needs arise.   Review of patient status, including review of consultants reports, relevant laboratory and other test results, and collaboration with appropriate care team members and the patient's provider was performed as part of comprehensive patient evaluation and provision of care management services.    Casimer Lanius, [place] / [place]   [phone] 11:52 AM

## 2021-05-17 ENCOUNTER — Other Ambulatory Visit: Payer: Self-pay

## 2021-05-17 ENCOUNTER — Encounter: Payer: Self-pay | Admitting: Family Medicine

## 2021-05-17 ENCOUNTER — Ambulatory Visit (INDEPENDENT_AMBULATORY_CARE_PROVIDER_SITE_OTHER): Payer: Medicaid Other | Admitting: Family Medicine

## 2021-05-17 ENCOUNTER — Other Ambulatory Visit (HOSPITAL_COMMUNITY)
Admission: RE | Admit: 2021-05-17 | Discharge: 2021-05-17 | Disposition: A | Discharge status: Home / Self Care | Payer: Medicaid Other | Source: Ambulatory Visit | Attending: Family Medicine | Admitting: Family Medicine

## 2021-05-17 ENCOUNTER — Telehealth: Payer: Self-pay | Admitting: Family Medicine

## 2021-05-17 VITALS — BP 140/80 | HR 72 | Ht 66.0 in | Wt 217.5 lb

## 2021-05-17 DIAGNOSIS — E039 Hypothyroidism, unspecified: Secondary | ICD-10-CM | POA: Diagnosis not present

## 2021-05-17 DIAGNOSIS — Z0000 Encounter for general adult medical examination without abnormal findings: Secondary | ICD-10-CM | POA: Diagnosis not present

## 2021-05-17 DIAGNOSIS — Z202 Contact with and (suspected) exposure to infections with a predominantly sexual mode of transmission: Secondary | ICD-10-CM | POA: Diagnosis present

## 2021-05-17 DIAGNOSIS — Z1231 Encounter for screening mammogram for malignant neoplasm of breast: Secondary | ICD-10-CM

## 2021-05-17 DIAGNOSIS — I10 Essential (primary) hypertension: Secondary | ICD-10-CM | POA: Diagnosis not present

## 2021-05-17 DIAGNOSIS — N898 Other specified noninflammatory disorders of vagina: Secondary | ICD-10-CM | POA: Diagnosis not present

## 2021-05-17 DIAGNOSIS — E785 Hyperlipidemia, unspecified: Secondary | ICD-10-CM | POA: Diagnosis not present

## 2021-05-17 DIAGNOSIS — F323 Major depressive disorder, single episode, severe with psychotic features: Secondary | ICD-10-CM | POA: Diagnosis not present

## 2021-05-17 DIAGNOSIS — E1169 Type 2 diabetes mellitus with other specified complication: Secondary | ICD-10-CM | POA: Diagnosis not present

## 2021-05-17 DIAGNOSIS — Z114 Encounter for screening for human immunodeficiency virus [HIV]: Secondary | ICD-10-CM | POA: Diagnosis not present

## 2021-05-17 LAB — POCT WET PREP (WET MOUNT)
Clue Cells Wet Prep Whiff POC: POSITIVE
Trichomonas Wet Prep HPF POC: ABSENT

## 2021-05-17 LAB — HM DIABETES EYE EXAM

## 2021-05-17 LAB — POCT GLYCOSYLATED HEMOGLOBIN (HGB A1C): HbA1c, POC (controlled diabetic range): 6.9 % (ref 0.0–7.0)

## 2021-05-17 MED ORDER — METRONIDAZOLE 500 MG PO TABS: 500.0000 mg | ORAL_TABLET | Freq: Two times a day (BID) | ORAL | 0 refills | Status: AC

## 2021-05-17 MED ORDER — ALBUTEROL SULFATE HFA 108 (90 BASE) MCG/ACT IN AERS: 2.0000 | INHALATION_SPRAY | Freq: Four times a day (QID) | RESPIRATORY_TRACT | 0 refills | Status: DC | PRN

## 2021-05-17 NOTE — Assessment & Plan Note (Signed)
Poor adherence to meds.  TSH checked. I will contact her soon with the result.

## 2021-05-17 NOTE — Assessment & Plan Note (Signed)
A1C 6.8 Medication adherence counseling provided. F/U in 3 months for reassessment.

## 2021-05-17 NOTE — Assessment & Plan Note (Signed)
BP elevated.  Off meds for a few days. Med counseling done. She will resume meds at home.

## 2021-05-17 NOTE — Patient Instructions (Signed)
Pneumococcal Vaccine, Polyvalent solution for injection What is this medication? PNEUMOCOCCAL VACCINE, POLYVALENT (NEU mo KOK al vak SEEN, pol ee VEY luhnt) is a vaccine to prevent pneumococcus bacteria infection. These bacteria are a major cause of ear infections, Strep throat infections, and serious pneumonia, meningitis, or blood infections worldwide. These vaccines help the body to produce antibodies (protective substances) that help your body defend against these bacteria. This vaccine is recommended for people 43 years of age and older with health problems. It is also recommended for all adults over 43 years old.This vaccine will not treat an infection. This medicine may be used for other purposes; ask your health care provider orpharmacist if you have questions. COMMON BRAND NAME(S): Pneumovax 23 What should I tell my care team before I take this medication? They need to know if you have any of these conditions: bleeding problems bone marrow or organ transplant cancer, Hodgkin's disease fever infection immune system problems low platelet count in the blood seizures an unusual or allergic reaction to pneumococcal vaccine, diphtheria toxoid, other vaccines, latex, other medicines, foods, dyes, or preservatives pregnant or trying to get pregnant breast-feeding How should I use this medication? This vaccine is for injection into a muscle or under the skin. It is given by ahealth care professional. A copy of Vaccine Information Statements will be given before each vaccination.Read this sheet carefully each time. The sheet may change frequently. Talk to your pediatrician regarding the use of this medicine in children. While this drug may be prescribed for children as young as 43 years of age forselected conditions, precautions do apply. Overdosage: If you think you have taken too much of this medicine contact apoison control center or emergency room at once. NOTE: This medicine is only for you.  Do not share this medicine with others. What if I miss a dose? It is important not to miss your dose. Call your doctor or health careprofessional if you are unable to keep an appointment. What may interact with this medication? medicines for cancer chemotherapy medicines that suppress your immune function medicines that treat or prevent blood clots like warfarin, enoxaparin, and dalteparin steroid medicines like prednisone or cortisone This list may not describe all possible interactions. Give your health care provider a list of all the medicines, herbs, non-prescription drugs, or dietary supplements you use. Also tell them if you smoke, drink alcohol, or use illegaldrugs. Some items may interact with your medicine. What should I watch for while using this medication? Mild fever and pain should go away in 3 days or less. Report any unusualsymptoms to your doctor or health care professional. What side effects may I notice from receiving this medication? Side effects that you should report to your doctor or health care professionalas soon as possible: allergic reactions like skin rash, itching or hives, swelling of the face, lips, or tongue breathing problems confused fever over 102 degrees F pain, tingling, numbness in the hands or feet seizures unusual bleeding or bruising unusual muscle weakness Side effects that usually do not require medical attention (report to yourdoctor or health care professional if they continue or are bothersome): aches and pains diarrhea fever of 102 degrees F or less headache irritable loss of appetite pain, tender at site where injected trouble sleeping This list may not describe all possible side effects. Call your doctor for medical advice about side effects. You may report side effects to FDA at1-800-FDA-1088. Where should I keep my medication? This does not apply. This vaccine is given in  a clinic, pharmacy, doctor'soffice, or other health care setting  and will not be stored at home. NOTE: This sheet is a summary. It may not cover all possible information. If you have questions about this medicine, talk to your doctor, pharmacist, orhealth care provider.  2022 Elsevier/Gold Standard (2008-06-05 14:32:37)

## 2021-05-17 NOTE — Assessment & Plan Note (Signed)
Recurrent. Great f/u with her therapist. Medication compliance discussed. F/U as needed.

## 2021-05-17 NOTE — Progress Notes (Signed)
Subjective:     Tanya Nolan is a 45 y.o. female and is here for a comprehensive physical exam. The patient reports problems - STD screen, new female sexual partner since 45 month.  Smoker: Tobacco smoking x 8 years , 1 PPD and Marijuana (daily since husband passed). Albuterol refill requested, intermittent smokers cough.  Poor compliance with all meds since 3 weeks except for her antidepressants. However, she will take time off her Abilify and Zoloft on and off x 1 week due to medication intolerance. Last Psych visit was 1 week ago, she f/u with therapist monthly.  No new concerns today. She denies SI/HI    Social History   Socioeconomic History   Marital status: Widowed    Spouse name: Not on file   Number of children: Not on file   Years of education: Not on file   Highest education level: Not on file  Occupational History   Not on file  Tobacco Use   Smoking status: Every Day    Packs/day: 0.50    Pack years: 0.00    Types: Cigarettes   Smokeless tobacco: Never  Vaping Use   Vaping Use: Never used  Substance and Sexual Activity   Alcohol use: Yes    Alcohol/week: 0.0 standard drinks    Comment: socially - wine/liquor--Rare   Drug use: No    Comment: past use "years ago" per patient   Sexual activity: Yes    Birth control/protection: None    Comment: 1st intercourse 45 yo-More than 5 partners  Other Topics Concern   Not on file  Social History Narrative   Not on file   Social Determinants of Health   Financial Resource Strain: Not on file  Food Insecurity: Not on file  Transportation Needs: Not on file  Physical Activity: Not on file  Stress: Not on file  Social Connections: Not on file  Intimate Partner Violence: Not on file   Health Maintenance  Topic Date Due   Pneumococcal Vaccine 42-55 Years old (1 - PCV) Never done   OPHTHALMOLOGY EXAM  04/30/2021   INFLUENZA VACCINE  06/13/2021   FOOT EXAM  08/13/2021   HEMOGLOBIN A1C  11/17/2021   PAP  SMEAR-Modifier  06/19/2024   TETANUS/TDAP  06/19/2029   PNEUMOCOCCAL POLYSACCHARIDE VACCINE AGE 53-64 HIGH RISK  Completed   COVID-19 Vaccine  Completed   Hepatitis C Screening  Completed   HIV Screening  Completed   HPV VACCINES  Aged Out    The following portions of the patient's history were reviewed and updated as appropriate: allergies, current medications, past family history, past medical history, past social history, past surgical history, and problem list.  Review of Systems Pertinent items are noted in HPI.   Objective:    BP 140/80   Pulse 72   Ht 5\' 6"  (1.676 m)   Wt 217 lb 8 oz (98.7 kg)   LMP 05/08/2021   SpO2 99%   BMI 35.11 kg/m  General appearance: alert and cooperative Head: Normocephalic, without obvious abnormality, atraumatic Eyes: conjunctivae/corneas clear. PERRL, EOM's intact. Fundi benign. Ears: normal TM's and external ear canals both ears Throat: lips, mucosa, and tongue normal; teeth and gums normal Neck: no adenopathy, no carotid bruit, no JVD, supple, symmetrical, trachea midline, and thyroid not enlarged, symmetric, no tenderness/mass/nodules Lungs: clear to auscultation bilaterally Heart: regular rate and rhythm, S1, S2 normal, no murmur, click, rub or gallop Abdomen: soft, non-tender; bowel sounds normal; no masses,  no organomegaly Pelvic: cervix  normal in appearance, external genitalia normal, and vagina normal without discharge Extremities: extremities normal, atraumatic, no cyanosis or edema Pulses: 2+ and symmetric Lymph nodes: Cervical, supraclavicular, and axillary nodes normal. Neurologic: Alert and oriented X 3, normal strength and tone. Normal symmetric reflexes. Normal coordination and gait    Flowsheet Row Office Visit from 05/17/2021 in [facility]  PHQ-9 Total Score 9       Assessment:    Healthy female exam. Normal exam    DM2 HTN Hypothyroidism HLD MDD Plan:   Normal exam. Up to date with her  PAP exam. Result reviewed and discussed with her, Mammogram slip provided and was ordered. PCV20 offered, but she declined despite adequate counseling. STD screen completed - HIV, RPR, GC/Chlam/ We prep done due to her endorsing vaginal itching. I will contact her with result and treat for positive BV.   DM2:  A1C 6.8 Medication adherence counseling provided. Bmet checked. F/U in 3 months for reassessment.  HTN: BP elevated.  Off meds for a few days. Med counseling done. She will resume meds at home.  Hypothyroidism Poor adherence to meds.  TSH checked. I will contact her soon with the result.   HLD: FLP checked. Medication compliance discussed.   MDD: Recurrent. Great f/u with her therapist. Medication compliance discussed. F/U as needed.   Smoking: Counseling provided. Record updated.

## 2021-05-17 NOTE — Telephone Encounter (Signed)
HIPAA compliant callback message left.  Please let her know that her wet prep is positive for BV.  Antibiotic escribed since she is symptomatic.

## 2021-05-17 NOTE — Assessment & Plan Note (Signed)
FLP checked. Medication compliance discussed.

## 2021-05-18 ENCOUNTER — Telehealth: Payer: Self-pay | Admitting: Family Medicine

## 2021-05-18 ENCOUNTER — Telehealth: Payer: Self-pay

## 2021-05-18 LAB — LIPID PANEL
Chol/HDL Ratio: 8.4 ratio — ABNORMAL HIGH (ref 0.0–4.4)
Cholesterol, Total: 210 mg/dL — ABNORMAL HIGH (ref 100–199)
HDL: 25 mg/dL — ABNORMAL LOW (ref >39)
LDL Chol Calc (NIH): 153 mg/dL — ABNORMAL HIGH (ref 0–99)
Triglycerides: 172 mg/dL — ABNORMAL HIGH (ref 0–149)
VLDL Cholesterol Cal: 32 mg/dL (ref 5–40)

## 2021-05-18 LAB — BASIC METABOLIC PANEL
BUN/Creatinine Ratio: 11 (ref 9–23)
BUN: 11 mg/dL (ref 6–24)
CO2: 22 mmol/L (ref 20–29)
Calcium: 9.2 mg/dL (ref 8.7–10.2)
Chloride: 105 mmol/L (ref 96–106)
Creatinine, Ser: 0.97 mg/dL (ref 0.57–1.00)
Glucose: 94 mg/dL (ref 65–99)
Potassium: 3.6 mmol/L (ref 3.5–5.2)
Sodium: 142 mmol/L (ref 134–144)
eGFR: 75 mL/min/{1.73_m2} (ref >59)

## 2021-05-18 LAB — HIV ANTIBODY (ROUTINE TESTING W REFLEX): HIV Screen 4th Generation wRfx: NONREACTIVE

## 2021-05-18 LAB — RPR: RPR Ser Ql: NONREACTIVE

## 2021-05-18 LAB — TSH: TSH: 2.53 u[IU]/mL (ref 0.450–4.500)

## 2021-05-18 MED ORDER — EZETIMIBE 10 MG PO TABS: 10.0000 mg | ORAL_TABLET | Freq: Every day | ORAL | 1 refills | Status: DC

## 2021-05-18 MED ORDER — ATORVASTATIN CALCIUM 80 MG PO TABS: 80.0000 mg | ORAL_TABLET | Freq: Every day | ORAL | 1 refills | Status: DC

## 2021-05-18 MED ORDER — LEVOTHYROXINE SODIUM 125 MCG PO TABS: 125.0000 ug | ORAL_TABLET | Freq: Every day | ORAL | 1 refills | Status: DC

## 2021-05-18 MED ORDER — EMPAGLIFLOZIN 10 MG PO TABS: 10.0000 mg | ORAL_TABLET | Freq: Every day | ORAL | 3 refills | Status: DC

## 2021-05-18 MED ORDER — ENALAPRIL MALEATE 2.5 MG PO TABS: 2.5000 mg | ORAL_TABLET | Freq: Every day | ORAL | 1 refills | Status: DC

## 2021-05-18 NOTE — Telephone Encounter (Signed)
HIPAA compliant callback message left.  Note: Here LDL and total cholesterol remains elevated. Likely due to poor medication adherence. Please advise her to resume Zetia and Lipitor 80 mg QD as instructed. I refilled all her medical meds.

## 2021-05-18 NOTE — Telephone Encounter (Signed)
Received fax from pharmacy. Albuterol (Ventolin) is not covered by patient's insurance. Albuterol (ProAir) is covered by insurance. Provided verbal okay to pharmacist to dispense insurance covered albuterol inhaler.   Talbot Grumbling, RN

## 2021-05-19 ENCOUNTER — Telehealth: Payer: Self-pay | Admitting: Family Medicine

## 2021-05-19 LAB — CERVICOVAGINAL ANCILLARY ONLY
Chlamydia: NEGATIVE
Comment: NEGATIVE
Comment: NORMAL
Neisseria Gonorrhea: NEGATIVE

## 2021-05-19 NOTE — Telephone Encounter (Signed)
All result discussed.She is aware of her A/B escribed to her pharmacy for BV. She has no other concerns.

## 2021-05-20 ENCOUNTER — Other Ambulatory Visit: Payer: Self-pay | Admitting: Family Medicine

## 2021-05-20 MED ORDER — ALBUTEROL SULFATE HFA 108 (90 BASE) MCG/ACT IN AERS: 1.0000 | INHALATION_SPRAY | Freq: Four times a day (QID) | RESPIRATORY_TRACT | 1 refills | Status: DC | PRN

## 2021-06-03 ENCOUNTER — Encounter: Payer: Self-pay | Admitting: Family Medicine

## 2021-06-06 ENCOUNTER — Ambulatory Visit: Payer: Medicaid Other

## 2021-06-06 NOTE — Patient Instructions (Signed)
Visit Information  Ms. Castellini  it was nice speaking with you. Please call me directly [phone] if you have questions about the goals we discussed.   Goals Addressed               This Visit's Progress     I don't check my blood sugars. (pt-stated)        Timeframe:  Long-Range Goal Priority:  High Start Date:         05/20/20                    Expected End Date:   07/12/21                Patient Goals/Self Care Activities:  Patient verbalizes understanding of plan Self-administers medications as prescribed Calls pharmacy for medication refills Call's provider office for new concerns or questions check blood sugar at prescribed times        The patient verbalized understanding of instructions, educational materials, and care plan provided today and declined offer to receive copy of patient instructions, educational materials, and care plan.   Follow up Plan: Patient would like continued follow-up.  CCM RNCM will outreach the patient within the next 2 weeks  Patient will call office if needed prior to next encounter  Lazaro Arms, RN  [phone]

## 2021-06-06 NOTE — Chronic Care Management (AMB) (Signed)
Care Management    RN Visit Note  06/06/2021 Name: Tanya Nolan MRN: [id number] DOB: 06-09-1976  Subjective: Tanya Nolan is a 45 y.o. year old female who is a primary care patient of [name], MD. The care management team was consulted for assistance with disease management and care coordination needs.    Engaged with patient by telephone for follow up visit in response to provider referral for case management and/or care coordination services.   Consent to Services:   Tanya Nolan was given information about Care Management services today including:  Care Management services includes personalized support from designated clinical staff supervised by her physician, including individualized plan of care and coordination with other care providers 24/7 contact phone numbers for assistance for urgent and routine care needs. The patient may stop case management services at any time by phone call to the office staff.  Patient agreed to services and consent obtained.   Assessment: Review of patient past medical history, allergies, medications, health status, including review of consultants reports, laboratory and other test data, was performed as part of comprehensive evaluation and provision of chronic care management services.   SDOH (Social Determinants of Health) assessments and interventions performed:    Care Plan  No Known Allergies  Outpatient Encounter Medications as of 06/06/2021  Medication Sig Note   Accu-Chek FastClix Lancets MISC Use to check CBG TID    albuterol (PROAIR HFA) 108 (90 Base) MCG/ACT inhaler Inhale 1-2 puffs into the lungs every 6 (six) hours as needed for wheezing or shortness of breath.    ARIPiprazole (ABILIFY) 5 MG tablet Take 1 tablet (5 mg total) by mouth daily. For mood control    atorvastatin (LIPITOR) 80 MG tablet Take 1 tablet (80 mg total) by mouth daily.    BD ULTRA-FINE PEN NEEDLES 29G X 12.7MM MISC USE THREE TIMES A DAY BEFORE MEALS     Blood Glucose Monitoring Suppl (ACCU-CHEK GUIDE) w/Device KIT 1 applicator by Does not apply route 3 (three) times daily. Use to check CBG TID    cetirizine (ZYRTEC) 10 MG tablet Take 1 tablet (10 mg total) by mouth daily.    empagliflozin (JARDIANCE) 10 MG TABS tablet Take 1 tablet (10 mg total) by mouth daily.    enalapril (VASOTEC) 2.5 MG tablet Take 1 tablet (2.5 mg total) by mouth daily. For high blood pressure    ezetimibe (ZETIA) 10 MG tablet Take 1 tablet (10 mg total) by mouth daily.    glucose blood (ACCU-CHEK GUIDE) test strip USE TO CHECK GBG 3 TIMES A DAY    hydrocortisone 2.5 % ointment Apply topically 2 (two) times daily. Limit use to one week 06/06/2021: As needed   levothyroxine (SYNTHROID) 125 MCG tablet Take 1 tablet (125 mcg total) by mouth daily before breakfast. Reported on 02/23/2016    polyethylene glycol powder (GLYCOLAX/MIRALAX) 17 GM/SCOOP powder Take 17 g by mouth daily as needed. 06/06/2021: As needed   sertraline (ZOLOFT) 100 MG tablet Take 1 tablet (100 mg total) by mouth daily. For depression    traZODone (DESYREL) 50 MG tablet Take 1 tablet (50 mg total) by mouth at bedtime as needed for sleep.    [DISCONTINUED] insulin lispro protamine-insulin lispro (HUMALOG 75/25) (75-25) 100 UNIT/ML SUSP Inject 40-50 Units into the skin 2 (two) times daily with a meal. Patient takes 40 units in the morning and 50 units at night.  Verified it is Humalog 75/25. 11/02/2011: Took 5 units as order by anesthesia at  1115  11/02/2011   No facility-administered encounter medications on file as of 06/06/2021.    Patient Active Problem List   Diagnosis Date Noted   Nodule of skin of left foot 04/15/2021   Incontinence in female 10/05/2020   Major depressive disorder, single episode, severe with psychotic features ([place]) 08/13/2020   Grief 11/10/2019   Tobacco abuse 07/15/2019   Chronic back pain 09/19/2016   Hemorrhoids 02/02/2012   Hypothyroidism 01/10/2007   Type 2 diabetes mellitus  with hyperlipidemia ([place]) 01/10/2007   POLYCYSTIC OVARY 01/10/2007   Hyperlipidemia 01/10/2007   HYPERTENSION, BENIGN SYSTEMIC 01/10/2007    Conditions to be addressed/monitored: DMII  Care Plan : RN Case Manager  Updates made by Tanya Arms, RN since 06/06/2021 12:00 AM     Problem: Glycemic Management (Diabetes, Type 2)      Long-Range Goal: Glycemic Management   Start Date: 10/01/2020  Expected End Date: 07/13/2021  Recent Progress: Not on track  Priority: Medium  Note:   Objective:  Lab Results  Component Value Date   HGBA1C 6.9 05/17/2021   Lab Results  Component Value Date   CREATININE 0.97 05/17/2021   CREATININE 0.74 03/23/2020   CREATININE 0.86 03/14/2020  Current Barriers:  Knowledge Deficits related to basic Diabetes pathophysiology and self care/management Does not use cbg meter   Case Manager Clinical Goal(s):  Over the next 90 days, patient will demonstrate improved adherence to prescribed treatment plan for diabetes self care/management as evidenced by: by checking her blood sugars at least 1-2 times per week   Interventions:  Provided education to patient about basic DM disease process Discussed plans with patient for ongoing care management follow up and provided patient with direct contact information for care management team Advised patient, providing education and rationale, to check cbg  and record, calling the office  for findings outside established parameters.   Review of patient status, including review of consultants reports, relevant laboratory and other test results, and medications completed. barriers to adherence to treatment plan identified- patient is still  not checking her blood sugars.  She states that she may check at least once per week. She states that she is doing fine no signs or symptoms  Blood glucose monitoring encouraged use of blood glucose monitoring log promoted Discussed healthy eating  Patient Goals/Self Care Activities:   Patient verbalizes understanding of plan Self-administers medications as prescribed Calls pharmacy for medication refills Call's provider office for new concerns or questions check blood sugar at prescribed times check blood sugar if I feel it is too high or too low take the blood sugar meter to all doctor visits Follow up and discuss issues with PCP about checking blood sugars        Plan: Telephone follow up appointment with care management team member scheduled for:  next 2 weeks  Tanya Arms RN, BSN, [place] Management Coordinator [place] Phone: [phone] I Fax: [phone]

## 2021-06-09 ENCOUNTER — Other Ambulatory Visit: Payer: Self-pay

## 2021-06-09 ENCOUNTER — Encounter (HOSPITAL_BASED_OUTPATIENT_CLINIC_OR_DEPARTMENT_OTHER): Payer: Self-pay | Admitting: Emergency Medicine

## 2021-06-09 ENCOUNTER — Emergency Department (HOSPITAL_BASED_OUTPATIENT_CLINIC_OR_DEPARTMENT_OTHER): Payer: Medicaid Other

## 2021-06-09 ENCOUNTER — Emergency Department (HOSPITAL_BASED_OUTPATIENT_CLINIC_OR_DEPARTMENT_OTHER)
Admission: EM | Admit: 2021-06-09 | Discharge: 2021-06-09 | Disposition: A | Discharge status: Home / Self Care | Payer: Medicaid Other | Attending: Emergency Medicine | Admitting: Emergency Medicine

## 2021-06-09 DIAGNOSIS — Z79899 Other long term (current) drug therapy: Secondary | ICD-10-CM | POA: Insufficient documentation

## 2021-06-09 DIAGNOSIS — E119 Type 2 diabetes mellitus without complications: Secondary | ICD-10-CM | POA: Insufficient documentation

## 2021-06-09 DIAGNOSIS — E039 Hypothyroidism, unspecified: Secondary | ICD-10-CM | POA: Diagnosis not present

## 2021-06-09 DIAGNOSIS — S80219A Abrasion, unspecified knee, initial encounter: Secondary | ICD-10-CM | POA: Insufficient documentation

## 2021-06-09 DIAGNOSIS — N3000 Acute cystitis without hematuria: Secondary | ICD-10-CM | POA: Diagnosis not present

## 2021-06-09 DIAGNOSIS — S11011A Laceration without foreign body of larynx, initial encounter: Secondary | ICD-10-CM | POA: Diagnosis not present

## 2021-06-09 DIAGNOSIS — W01198A Fall on same level from slipping, tripping and stumbling with subsequent striking against other object, initial encounter: Secondary | ICD-10-CM | POA: Insufficient documentation

## 2021-06-09 DIAGNOSIS — F17210 Nicotine dependence, cigarettes, uncomplicated: Secondary | ICD-10-CM | POA: Insufficient documentation

## 2021-06-09 DIAGNOSIS — S1981XA Other specified injuries of larynx, initial encounter: Secondary | ICD-10-CM | POA: Diagnosis present

## 2021-06-09 DIAGNOSIS — Z7984 Long term (current) use of oral hypoglycemic drugs: Secondary | ICD-10-CM | POA: Diagnosis not present

## 2021-06-09 DIAGNOSIS — Z794 Long term (current) use of insulin: Secondary | ICD-10-CM | POA: Diagnosis not present

## 2021-06-09 DIAGNOSIS — R519 Headache, unspecified: Secondary | ICD-10-CM | POA: Insufficient documentation

## 2021-06-09 DIAGNOSIS — I10 Essential (primary) hypertension: Secondary | ICD-10-CM | POA: Insufficient documentation

## 2021-06-09 LAB — CBC
HCT: 41.4 % (ref 36.0–46.0)
Hemoglobin: 13.6 g/dL (ref 12.0–15.0)
MCH: 28.9 pg (ref 26.0–34.0)
MCHC: 32.9 g/dL (ref 30.0–36.0)
MCV: 88.1 fL (ref 80.0–100.0)
Platelets: 258 10*3/uL (ref 150–400)
RBC: 4.7 MIL/uL (ref 3.87–5.11)
RDW: 14.5 % (ref 11.5–15.5)
WBC: 18.5 10*3/uL — ABNORMAL HIGH (ref 4.0–10.5)
nRBC: 0 % (ref 0.0–0.2)

## 2021-06-09 LAB — BASIC METABOLIC PANEL
Anion gap: 8 (ref 5–15)
BUN: 10 mg/dL (ref 6–20)
CO2: 24 mmol/L (ref 22–32)
Calcium: 9.2 mg/dL (ref 8.9–10.3)
Chloride: 109 mmol/L (ref 98–111)
Creatinine, Ser: 0.86 mg/dL (ref 0.44–1.00)
GFR, Estimated: >60 mL/min (ref >60)
Glucose, Bld: 195 mg/dL — ABNORMAL HIGH (ref 70–99)
Potassium: 3.9 mmol/L (ref 3.5–5.1)
Sodium: 141 mmol/L (ref 135–145)

## 2021-06-09 LAB — DIFFERENTIAL
Abs Immature Granulocytes: 0.05 10*3/uL (ref 0.00–0.07)
Basophils Absolute: 0.1 10*3/uL (ref 0.0–0.1)
Basophils Relative: 0 %
Eosinophils Absolute: 0.1 10*3/uL (ref 0.0–0.5)
Eosinophils Relative: 1 %
Immature Granulocytes: 0 %
Lymphocytes Relative: 15 %
Lymphs Abs: 2.8 10*3/uL (ref 0.7–4.0)
Monocytes Absolute: 1.2 10*3/uL — ABNORMAL HIGH (ref 0.1–1.0)
Monocytes Relative: 7 %
Neutro Abs: 14.3 10*3/uL — ABNORMAL HIGH (ref 1.7–7.7)
Neutrophils Relative %: 77 %

## 2021-06-09 LAB — CBG MONITORING, ED
Glucose-Capillary: 189 mg/dL — ABNORMAL HIGH (ref 70–99)
Glucose-Capillary: 144 mg/dL — ABNORMAL HIGH (ref 70–99)

## 2021-06-09 LAB — URINALYSIS, ROUTINE W REFLEX MICROSCOPIC
Bilirubin Urine: NEGATIVE
Glucose, UA: 250 mg/dL — AB
Ketones, ur: NEGATIVE mg/dL
Nitrite: NEGATIVE
Protein, ur: 30 mg/dL — AB
Specific Gravity, Urine: 1.032 — ABNORMAL HIGH (ref 1.005–1.030)
pH: 5.5 (ref 5.0–8.0)

## 2021-06-09 LAB — PREGNANCY, URINE: Preg Test, Ur: NEGATIVE

## 2021-06-09 MED ORDER — IOHEXOL 350 MG/ML SOLN
75.0000 mL | Freq: Once | INTRAVENOUS | Status: AC | PRN
  Administered 2021-06-09: 75 mL via INTRAVENOUS

## 2021-06-09 MED ORDER — CEPHALEXIN 500 MG PO CAPS: 500.0000 mg | ORAL_CAPSULE | Freq: Four times a day (QID) | ORAL | 0 refills | Status: DC

## 2021-06-09 NOTE — ED Triage Notes (Signed)
Pt via pov from work with neck and throat pain after a fall last night. Pt states she lost consciousness and fell, hitting her mother's shower chair, landed with her neck around th bar. Pt states she has pain when she swallows or talks, also c/o duller pain when she not doing either of those. Pt alert & oriented, nad noted.

## 2021-06-09 NOTE — Discharge Instructions (Addendum)
Make an appointment to follow-up with ear nose and throat for recheck of your neck injury.  CT scan without any acute findings.  Urinalysis suggestive of urinary tract infection.  Urine sent for culture.  Take the medication Keflex as directed for the next 7 days.  Work note provided to be out of work until Monday.

## 2021-06-09 NOTE — ED Notes (Signed)
Called lab to add on Diff to CBC

## 2021-06-09 NOTE — ED Provider Notes (Signed)
[facility] EMERGENCY DEPT Provider Note   CSN: [id number] Arrival date & time: 06/09/21  1157     History Chief Complaint  Patient presents with   Loss of Consciousness   Neck Pain    Tanya Nolan is a 45 y.o. female.  Patient came in by POV from work.  Patient had a fall last night where she thinks she passed out she struck her anterior neck on her mother's shower chair.  States she has pain when she swallows and talks.  Patient has been able to drink liquids today without any difficulty.  Voice is a little hoarse than usual.  States she had an abrasion to her knee but not concerned about that injury.  Also did hit her head.  Patient is followed by [facility] family practice.  Patient states she has not been feeling well the past few days.  Has had some nausea.  But no vomiting.  No fevers.  No upper respiratory symptoms.      Past Medical History:  Diagnosis Date   Abdominal pain 03/26/2009   Qualifier: Diagnosis of  By: Jerline Pain MD, [place], LUMBAR 03/26/2009   Qualifier: Diagnosis of  By: Jerline Pain MD, Anderson [place]     [place] 10/01/2007   Qualifier: Diagnosis of  By: Genene Churn MD, Jessica     Chronic back pain    tx with ibuprofen   Diabetes mellitus    Hypercholesteremia    Hyperlipidemia    no meds - tx with diet   Hypertension    Hypothyroidism    Ingrown toenail 12/25/2014   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   C&B WNL  NEG PAPS after   MDD (major depressive disorder), recurrent severe, without psychosis ([place]) 03/15/2020   Nipple discharge 10/13/2013   Non compliance w medication regimen 10/13/2013   Simple endometrial hyperplasia 09/2006   BENIGN SECRETORY 02/2007   Thyroid dysfunction     Patient Active Problem List   Diagnosis Date Noted   Nodule of skin of left foot 04/15/2021   Incontinence in female 10/05/2020   Major depressive disorder, single episode, severe with psychotic features ([place]) 08/13/2020    Grief 11/10/2019   Tobacco abuse 07/15/2019   Chronic back pain 09/19/2016   Hemorrhoids 02/02/2012   Hypothyroidism 01/10/2007   Type 2 diabetes mellitus with hyperlipidemia ([place]) 01/10/2007   POLYCYSTIC OVARY 01/10/2007   Hyperlipidemia 01/10/2007   HYPERTENSION, BENIGN SYSTEMIC 01/10/2007    Past Surgical History:  Procedure Laterality Date   [place] DECOMPRESSION  2008   HYSTEROSCOPY X 2  2007 / 2012   ENDOMETRIAL POLYPS REMOVED     OB History     Gravida  0   Para      Term      Preterm      AB      Living         SAB      IAB      Ectopic      Multiple      Live Births              Family History  Problem Relation Age of Onset   Hypertension Sister    Lupus Mother    Hypertension Mother    Heart disease Father    Gout Father    Lupus Father     Social History   Tobacco Use   Smoking status: Every Day  Packs/day: 1.00    Years: 8.00    Pack years: 8.00    Types: Cigarettes   Smokeless tobacco: Never  Vaping Use   Vaping Use: Never used  Substance Use Topics   Alcohol use: Yes    Alcohol/week: 0.0 standard drinks    Comment: socially - wine/liquor--Rare   Drug use: Yes    Frequency: 7.0 times per week    Types: Marijuana    Home Medications Prior to Admission medications   Medication Sig Start Date End Date Taking? Authorizing Provider  cephALEXin (KEFLEX) 500 MG capsule Take 1 capsule (500 mg total) by mouth 4 (four) times daily. 06/09/21  Yes Fredia Sorrow, MD  Accu-Chek FastClix Lancets MISC Use to check CBG TID 07/22/19   [name], MD  albuterol (PROAIR HFA) 108 (90 Base) MCG/ACT inhaler Inhale 1-2 puffs into the lungs every 6 (six) hours as needed for wheezing or shortness of breath. 05/20/21   [name], MD  ARIPiprazole (ABILIFY) 5 MG tablet Take 1 tablet (5 mg total) by mouth daily. For mood control 05/11/21   Salley Slaughter, NP  atorvastatin (LIPITOR) 80 MG tablet Take 1 tablet (80 mg total) by  mouth daily. 05/18/21   [name], MD  BD ULTRA-FINE PEN NEEDLES 29G X 12.7MM MISC USE THREE TIMES A DAY BEFORE MEALS 06/10/13   Andrena Mews T, MD  Blood Glucose Monitoring Suppl (ACCU-CHEK GUIDE) w/Device KIT 1 applicator by Does not apply route 3 (three) times daily. Use to check CBG TID 07/22/19   [name], MD  cetirizine (ZYRTEC) 10 MG tablet Take 1 tablet (10 mg total) by mouth daily. 04/13/21   Simmons-Robinson, Makiera, MD  empagliflozin (JARDIANCE) 10 MG TABS tablet Take 1 tablet (10 mg total) by mouth daily. 05/18/21   [name], MD  enalapril (VASOTEC) 2.5 MG tablet Take 1 tablet (2.5 mg total) by mouth daily. For high blood pressure 05/18/21   Andrena Mews T, MD  ezetimibe (ZETIA) 10 MG tablet Take 1 tablet (10 mg total) by mouth daily. 05/18/21   [name], MD  glucose blood (ACCU-CHEK GUIDE) test strip USE TO CHECK GBG 3 TIMES A DAY 04/13/21   Simmons-Robinson, Makiera, MD  hydrocortisone 2.5 % ointment Apply topically 2 (two) times daily. Limit use to one week 04/13/21   Simmons-Robinson, Riki Sheer, MD  levothyroxine (SYNTHROID) 125 MCG tablet Take 1 tablet (125 mcg total) by mouth daily before breakfast. Reported on 02/23/2016 05/18/21   [name], MD  polyethylene glycol powder (GLYCOLAX/MIRALAX) 17 GM/SCOOP powder Take 17 g by mouth daily as needed. 06/20/19   [name], MD  sertraline (ZOLOFT) 100 MG tablet Take 1 tablet (100 mg total) by mouth daily. For depression 05/11/21   Salley Slaughter, NP  traZODone (DESYREL) 50 MG tablet Take 1 tablet (50 mg total) by mouth at bedtime as needed for sleep. 05/11/21   Salley Slaughter, NP  insulin lispro protamine-insulin lispro (HUMALOG 75/25) (75-25) 100 UNIT/ML SUSP Inject 40-50 Units into the skin 2 (two) times daily with a meal. Patient takes 40 units in the morning and 50 units at night.  Verified it is Humalog 75/25.  04/18/12  [provider]    Allergies    Patient has no known  allergies.  Review of Systems   Review of Systems  Constitutional:  Negative for chills and fever.  HENT:  Positive for trouble swallowing and voice change. Negative for ear pain and  sore throat.   Eyes:  Negative for pain and visual disturbance.  Respiratory:  Negative for cough and shortness of breath.   Cardiovascular:  Negative for chest pain and palpitations.  Gastrointestinal:  Negative for abdominal pain and vomiting.  Genitourinary:  Negative for dysuria and hematuria.  Musculoskeletal:  Positive for neck pain. Negative for arthralgias and back pain.  Skin:  Positive for wound. Negative for color change and rash.  Neurological:  Positive for syncope and headaches. Negative for seizures.  All other systems reviewed and are negative.  Physical Exam Updated Vital Signs BP (!) 152/85 (BP Location: Left Arm)   Pulse (!) 51   Temp 98.6 F (37 C) (Oral)   Resp 16   Ht 1.702 m (_0 )   Wt 98.4 kg   LMP 05/30/2021 (Approximate)   SpO2 100%   BMI 33.99 kg/m   Physical Exam Vitals and nursing note reviewed.  Constitutional:      General: She is not in acute distress.    Appearance: Normal appearance. She is well-developed. She is not ill-appearing or toxic-appearing.  HENT:     Head: Normocephalic and atraumatic.  Eyes:     Extraocular Movements: Extraocular movements intact.     Conjunctiva/sclera: Conjunctivae normal.     Pupils: Pupils are equal, round, and reactive to light.  Neck:     Comments: Patient with tenderness to the laryngeal area.  Superficial laceration and abrasion right at the larynx.  Thyroid cartilage area. Cardiovascular:     Rate and Rhythm: Normal rate and regular rhythm.     Heart sounds: No murmur heard. Pulmonary:     Effort: Pulmonary effort is normal. No respiratory distress.     Breath sounds: Normal breath sounds.  Abdominal:     Palpations: Abdomen is soft.     Tenderness: There is no abdominal tenderness.  Musculoskeletal:         General: Normal range of motion.     Cervical back: Neck supple. Tenderness present.  Skin:    General: Skin is warm and dry.  Neurological:     General: No focal deficit present.     Mental Status: She is alert and oriented to person, place, and time.     Cranial Nerves: No cranial nerve deficit.     Sensory: No sensory deficit.     Motor: No weakness.    ED Results / Procedures / Treatments   Labs (all labs ordered are listed, but only abnormal results are displayed) Labs Reviewed  BASIC METABOLIC PANEL - Abnormal; Notable for the following components:      Result Value   Glucose, Bld 195 (*)    All other components within normal limits  CBC - Abnormal; Notable for the following components:   WBC 18.5 (*)    All other components within normal limits  URINALYSIS, ROUTINE W REFLEX MICROSCOPIC - Abnormal; Notable for the following components:   Specific Gravity, Urine 1.032 (*)    Glucose, UA 250 (*)    Hgb urine dipstick SMALL (*)    Protein, ur 30 (*)    Leukocytes,Ua MODERATE (*)    All other components within normal limits  DIFFERENTIAL - Abnormal; Notable for the following components:   Neutro Abs 14.3 (*)    Monocytes Absolute 1.2 (*)    All other components within normal limits  CBG MONITORING, ED - Abnormal; Notable for the following components:   Glucose-Capillary 189 (*)    All other components within normal  limits  CBG MONITORING, ED - Abnormal; Notable for the following components:   Glucose-Capillary 144 (*)    All other components within normal limits  URINE CULTURE  PREGNANCY, URINE    EKG EKG Interpretation  Date/Time:  Thursday June 09 2021 12:41:58 EDT Ventricular Rate:  72 PR Interval:  144 QRS Duration: 76 QT Interval:  428 QTC Calculation: 468 R Axis:   45 Text Interpretation: Normal sinus rhythm Cannot rule out Anterior infarct , age undetermined Abnormal ECG Confirmed by Fredia Sorrow [phone]) on 06/09/2021 3:12:15 PM  Radiology CT Head  Wo Contrast  Result Date: 06/09/2021 CLINICAL DATA:  Polytrauma, critical, head/C-spine injury suspected Loss of consciousness leading to fall striking shower chair. EXAM: CT HEAD WITHOUT CONTRAST TECHNIQUE: Contiguous axial images were obtained from the base of the skull through the vertex without intravenous contrast. COMPARISON:  None. FINDINGS: Brain: No intracranial hemorrhage, mass effect, or midline shift. No hydrocephalus. The basilar cisterns are patent. No evidence of territorial infarct or acute ischemia. No extra-axial or intracranial fluid collection. Vascular: No hyperdense vessel or unexpected calcification. Skull: Normal. Negative for fracture or focal lesion. Sinuses/Orbits: Paranasal sinuses and mastoid air cells are clear. The visualized orbits are unremarkable. Other: None. IMPRESSION: Negative noncontrast head CT. Electronically Signed   By: Keith Rake M.D.   On: 06/09/2021 18:38   CT Soft Tissue Neck W Contrast  Result Date: 06/09/2021 CLINICAL DATA:  Laryngeal edema. Patient with a fall striking her anterior larynx. Pain with swallowing. Voice slightly hoarse. EXAM: CT NECK WITH CONTRAST TECHNIQUE: Multidetector CT imaging of the neck was performed using the standard protocol following the bolus administration of intravenous contrast. CONTRAST:  60m OMNIPAQUE IOHEXOL 350 MG/ML SOLN COMPARISON:  None. FINDINGS: Pharynx and larynx: Mild motion and metallic dental streak artifact. No evidence of significant swelling or a mass. Closed glottis. No retropharyngeal fluid. Salivary glands: No inflammation, mass, or stone. Thyroid: Unremarkable. Lymph nodes: Slight asymmetric enlargement of a single left level II a lymph node which measures 10 mm in short axis, upper limits of normal and likely benign. Vascular: Major vascular structures of the neck are grossly patent. Limited intracranial: Unremarkable. Visualized orbits: Unremarkable. Mastoids and visualized paranasal sinuses: Clear.  Skeleton: No acute osseous abnormality or suspicious osseous lesion. Incomplete segmentation at C1-2. Mild cervical spondylosis. Upper chest: Clear lung apices. Other: None. IMPRESSION: No acute abnormality identified in the neck. Electronically Signed   By: ALogan BoresM.D.   On: 06/09/2021 19:11    Procedures Procedures   Medications Ordered in ED Medications  iohexol (OMNIPAQUE) 350 MG/ML injection 75 mL (75 mLs Intravenous Contrast Given 06/09/21 1832)    ED Course  I have reviewed the triage vital signs and the nursing notes.  Pertinent labs & imaging results that were available during my care of the patient were reviewed by me and considered in my medical decision making (see chart for details).    MDM Rules/Calculators/A&P                           Patient with concern for laryngeal injury.  Patient is able to swallow fluids well.  Which is reassuring.  Patient talking well.  States her voice is slightly hoarse.  Does hurt some to swallow.  Will get CT head and CT soft tissue neck for further evaluation.  May require ear nose and throat follow-up.  Labs here today had a marked leukocytosis but a normal differential.  Patient  has elevated blood sugar.  But no acidosis.  Urinalysis suggestive of possible urinary tract infection.  Will be sent for culture.  We will most likely treat patient for this.  May be the explanation for her not feeling well for the past few days.  EKG without any arrhythmias significant for the history of the syncope.  CT scan head without any acute findings.  CT scan soft tissue neck without evidence of any significant injury.  Discussed with Dr. Constance Holster from ear nose and throat.  He will follow her up in the office.  Patient's urinalysis suggestive of a urinary tract infection patient be treated with Keflex.  Urine sent for culture.   Final Clinical Impression(s) / ED Diagnoses Final diagnoses:  Acute cystitis without hematuria  Laryngeal injury,  initial encounter    Rx / DC Orders ED Discharge Orders          Ordered    cephALEXin (KEFLEX) 500 MG capsule  4 times daily        06/09/21 2024             Fredia Sorrow, MD 06/09/21 2025

## 2021-06-10 LAB — URINE CULTURE: Culture: NO GROWTH

## 2021-06-20 ENCOUNTER — Telehealth: Payer: Medicaid Other

## 2021-06-20 ENCOUNTER — Telehealth: Payer: Self-pay

## 2021-06-20 NOTE — Telephone Encounter (Signed)
   RN Case Manager Care Management   Phone Outreach    06/20/2021 Name: [name] MRN: [id number] DOB: Aug 20, 1976  Tanya Nolan is a 45 y.o. year old female who is a primary care patient of [place], Phill Myron, MD .   Telephone outreach was unsuccessful A HIPPA compliant phone message was left for the patient providing contact information and requesting a return call.   Plan:Will route chart to Care Guide to see if patient would like to reschedule phone appointment   Review of patient status, including review of consultants reports, relevant laboratory and other test results, and collaboration with appropriate care team members and the patient's provider was performed as part of comprehensive patient evaluation and provision of care management services.    Lazaro Arms RN, BSN, [facility] Care Management Coordinator [place] Phone: [phone] I Fax: [phone]

## 2021-06-28 ENCOUNTER — Ambulatory Visit: Payer: Medicaid Other

## 2021-06-28 NOTE — Chronic Care Management (AMB) (Signed)
Chronic Care Management   CCM RN Visit Note    06/28/2021 Name: Tanya Nolan MRN: [id number] DOB: Jun 19, 1976  Tanya Nolan is a 45 y.o. year old female who is a primary care patient of En[place], Phill Myron, MD.   CCM RNCM reached out to the patient today by phone to assess the needs for Care Management services.  The patient was having some problems with her phone and she stated that she would give me a callback,  Plan: CCM RNCM will wait for a return call. If no return call is received, I will route the chart to Care Guide to see if the patient would like to reschedule the phone appointment   A review of patient status, including review of consultants' reports, relevant laboratory and other test results, and collaboration with appropriate care team members and the patient's provider was performed as part of comprehensive patient evaluation and provision of care management services.       06/28/2021 Name: AMIT MELOY MRN: [id number] DOB: 1976/08/20  Subjective: Tanya Nolan is a 45 y.o. year old female who is a primary care patient of [name], MD. The care management team was consulted for assistance with disease management and care coordination needs.  The patient returned call to [facility].  Engaged with patient by telephone for follow up visit in response to provider referral for case management and/or care coordination services.   Consent to Services:  The patient was given information about Chronic Care Management services, agreed to services, and gave verbal consent prior to initiation of services.  Please see initial visit note for detailed documentation.   Patient agreed to services and verbal consent obtained.     Assessment:  The patient is making progress with her blood sugars, but recently experienced problems with having a urinary tract infection . See Care Plan below for interventions and patient self-care actives. Follow up Plan: Patient would like continued  follow-up.  CCM RNCM will outreach the patient within the next 4 weeks  Patient will call office if needed prior to next encounter  Review of patient past medical history, allergies, medications, health status, including review of consultants reports, laboratory and other test data, was performed as part of comprehensive evaluation and provision of chronic care management services.   SDOH (Social Determinants of Health) assessments and interventions performed:  no  CCM Care Plan  No Known Allergies  Outpatient Encounter Medications as of 06/28/2021  Medication Sig Note   Accu-Chek FastClix Lancets MISC Use to check CBG TID    albuterol (PROAIR HFA) 108 (90 Base) MCG/ACT inhaler Inhale 1-2 puffs into the lungs every 6 (six) hours as needed for wheezing or shortness of breath.    ARIPiprazole (ABILIFY) 5 MG tablet Take 1 tablet (5 mg total) by mouth daily. For mood control    atorvastatin (LIPITOR) 80 MG tablet Take 1 tablet (80 mg total) by mouth daily.    BD ULTRA-FINE PEN NEEDLES 29G X 12.7MM MISC USE THREE TIMES A DAY BEFORE MEALS    cephALEXin (KEFLEX) 500 MG capsule Take 1 capsule (500 mg total) by mouth 4 (four) times daily.    cetirizine (ZYRTEC) 10 MG tablet Take 1 tablet (10 mg total) by mouth daily.    empagliflozin (JARDIANCE) 10 MG TABS tablet Take 1 tablet (10 mg total) by mouth daily.    enalapril (VASOTEC) 2.5 MG tablet Take 1 tablet (2.5 mg total) by mouth daily. For high blood pressure  ezetimibe (ZETIA) 10 MG tablet Take 1 tablet (10 mg total) by mouth daily.    glucose blood (ACCU-CHEK GUIDE) test strip USE TO CHECK GBG 3 TIMES A DAY    hydrocortisone 2.5 % ointment Apply topically 2 (two) times daily. Limit use to one week 06/06/2021: As needed   levothyroxine (SYNTHROID) 125 MCG tablet Take 1 tablet (125 mcg total) by mouth daily before breakfast. Reported on 02/23/2016    polyethylene glycol powder (GLYCOLAX/MIRALAX) 17 GM/SCOOP powder Take 17 g by mouth daily as needed.  06/06/2021: As needed   sertraline (ZOLOFT) 100 MG tablet Take 1 tablet (100 mg total) by mouth daily. For depression    traZODone (DESYREL) 50 MG tablet Take 1 tablet (50 mg total) by mouth at bedtime as needed for sleep.    Blood Glucose Monitoring Suppl (ACCU-CHEK GUIDE) w/Device KIT 1 applicator by Does not apply route 3 (three) times daily. Use to check CBG TID    [DISCONTINUED] insulin lispro protamine-insulin lispro (HUMALOG 75/25) (75-25) 100 UNIT/ML SUSP Inject 40-50 Units into the skin 2 (two) times daily with a meal. Patient takes 40 units in the morning and 50 units at night.  Verified it is Humalog 75/25. 11/02/2011: Took 5 units as order by anesthesia at 1115  11/02/2011   No facility-administered encounter medications on file as of 06/28/2021.    Patient Active Problem List   Diagnosis Date Noted   Nodule of skin of left foot 04/15/2021   Incontinence in female 10/05/2020   Major depressive disorder, single episode, severe with psychotic features ([place]) 08/13/2020   Grief 11/10/2019   Tobacco abuse 07/15/2019   Chronic back pain 09/19/2016   Hemorrhoids 02/02/2012   Hypothyroidism 01/10/2007   Type 2 diabetes mellitus with hyperlipidemia ([place]) 01/10/2007   POLYCYSTIC OVARY 01/10/2007   Hyperlipidemia 01/10/2007   HYPERTENSION, BENIGN SYSTEMIC 01/10/2007    Conditions to be addressed/monitored:DMII and UTI  Care Plan : RN Case Manager  Updates made by Lazaro Arms, RN since 06/28/2021 12:00 AM     Problem: Glycemic Management (Diabetes, Type 2)      Long-Range Goal: Glycemic Management   Start Date: 10/01/2020  Expected End Date: 07/13/2021  Recent Progress: Not on track  Priority: Medium  Note:   Objective:  Lab Results  Component Value Date   HGBA1C 6.9 05/17/2021   Lab Results  Component Value Date   CREATININE 0.97 05/17/2021   CREATININE 0.74 03/23/2020   CREATININE 0.86 03/14/2020  Current Barriers:  Knowledge Deficits related to basic Diabetes  pathophysiology and self care/management Does not use cbg meter   Case Manager Clinical Goal(s):  Over the next 90 days, patient will demonstrate improved adherence to prescribed treatment plan for diabetes self care/management as evidenced by: by checking her blood sugars at least 1-2 times per week   Interventions:  Provided education to patient about basic DM disease process Discussed plans with patient for ongoing care management follow up and provided patient with direct contact information for care management team Advised patient, providing education and rationale, to check cbg  and record, calling the office  for findings outside established parameters.   Review of patient status, including review of consultants reports, relevant laboratory and other test results, and medications completed. Blood glucose monitoring encouraged use of blood glucose monitoring log promoted Discussed healthy eating The patient stated that she is checking her blood sugar.  She only checks once a week for now.  Her blood sugar has been ranging in the 140s  Patient Goals/Self Care Activities:  Patient verbalizes understanding of plan Self-administers medications as prescribed Calls pharmacy for medication refills Call's provider office for new concerns or questions check blood sugar at prescribed times check blood sugar if I feel it is too high or too low take the blood sugar meter to all doctor visits Follow up and discuss issues with PCP about checking blood sugars       Problem: UTI      Goal: Patient will manage UTI symptoms by taking her medications and staying hydrated   Start Date: 06/28/2021  Expected End Date: 08/12/2021  This Visit's Progress: On track  Priority: High  Note:   Current Barriers:  Knowledge Deficits related to plan of care for management of UTI - The patient states that she became dizzy had a fall and hit her neck on her mother's shower chair.  She went to the ER on  06/09/21 and found out that she had a UTI.  RNCM Clinical Goal(s):  Patient will verbalize understanding of plan for management of UTI  through collaboration with RN Care manager, provider, and care team.   Interventions: 1:1 collaboration with primary care provider regarding development and update of comprehensive plan of care as evidenced by provider attestation and co-signature Inter-disciplinary care team collaboration (see longitudinal plan of care) Evaluation of current treatment plan related to  self management and patient's adherence to plan as established by provider   (Status: New goal.) Evaluation of current treatment plan related to  UTI , self-management and patient's adherence to plan as established by provider. Discussed plans with patient for ongoing care management follow up and provided patient with direct contact information for care management team Advised patient to stay hydrated drinking more water cutting back on the sodas and juice.   Reviewed her medications and discussed the importance of taking all of her antibiotics. She verbalized understanding.    Patient Goals/Self-Care Activities: Patient will self administer medications as prescribed Patient will attend all scheduled provider appointments Patient will call pharmacy for medication refills Patient will call provider office for new concerns or questions Patient will take all of her antibiotics        Lazaro Arms RN, BSN, [facility] Care Management Coordinator [place] Phone: [phone] I Fax: [phone]

## 2021-06-28 NOTE — Patient Instructions (Signed)
Visit Information  Ms. Deol  it was nice speaking with you. Please call me directly [phone] if you have questions about the goals we discussed.   Goals Addressed               This Visit's Progress     I don't check my blood sugars. (pt-stated)        Timeframe:  Long-Range Goal Priority:  High Start Date:         05/20/20                    Expected End Date:   08/12/21                Patient Goals/Self Care Activities:  Patient verbalizes understanding of plan Self-administers medications as prescribed Calls pharmacy for medication refills Call's provider office for new concerns or questions check blood sugar at prescribed times      UTI (pt-stated)        Timeframe:  Short-Term Goal Priority:  High Start Date: 06/28/21                            Expected End Date:  08/12/21                      Patient will self administer medications as prescribed Patient will attend all scheduled provider appointments Patient will call pharmacy for medication refills Patient will call provider office for new concerns or questions       The patient verbalizes understanding of the information and instructions discussed today.  Our next appointment is scheduled for  07/26/21.   Please feel free to call me or the office if you have any questions or concerns.  Lazaro Arms RN, BSN, [facility] Care Management Coordinator [place] Phone: [phone] I Fax: [phone]

## 2021-07-12 ENCOUNTER — Other Ambulatory Visit: Payer: Self-pay

## 2021-07-12 ENCOUNTER — Ambulatory Visit (HOSPITAL_COMMUNITY): Payer: Medicaid Other | Admitting: Clinical

## 2021-07-12 ENCOUNTER — Telehealth (HOSPITAL_COMMUNITY): Payer: Self-pay | Admitting: Clinical

## 2021-07-12 NOTE — Telephone Encounter (Signed)
Therapist was able to speak with the client on mychart video. Client reported she does have a therapist with [facility] but has not spoken to that person in three weeks. Therapist instructed the client to call and make an appointment with her established therapist. Client reported no other concerns at this time.

## 2021-07-14 ENCOUNTER — Other Ambulatory Visit: Payer: Self-pay

## 2021-07-14 ENCOUNTER — Encounter (HOSPITAL_COMMUNITY): Payer: Self-pay | Admitting: Psychiatry

## 2021-07-14 ENCOUNTER — Telehealth (INDEPENDENT_AMBULATORY_CARE_PROVIDER_SITE_OTHER): Payer: Medicaid Other | Admitting: Psychiatry

## 2021-07-14 DIAGNOSIS — F32A Depression, unspecified: Secondary | ICD-10-CM | POA: Insufficient documentation

## 2021-07-14 DIAGNOSIS — F320 Major depressive disorder, single episode, mild: Secondary | ICD-10-CM | POA: Diagnosis not present

## 2021-07-14 DIAGNOSIS — F323 Major depressive disorder, single episode, severe with psychotic features: Secondary | ICD-10-CM

## 2021-07-14 MED ORDER — SERTRALINE HCL 100 MG PO TABS: 100.0000 mg | ORAL_TABLET | Freq: Every day | ORAL | 3 refills | Status: DC

## 2021-07-14 MED ORDER — TRAZODONE HCL 50 MG PO TABS: 50.0000 mg | ORAL_TABLET | Freq: Every evening | ORAL | 3 refills | Status: DC | PRN

## 2021-07-14 MED ORDER — ARIPIPRAZOLE 5 MG PO TABS: 5.0000 mg | ORAL_TABLET | Freq: Every day | ORAL | 3 refills | Status: DC

## 2021-07-14 NOTE — Progress Notes (Signed)
[facility] MD/PA/NP OP Progress Note Virtual Visit via Telephone Note  I connected with Tanya Nolan on 07/14/21 at  1:30 PM EDT by telephone and verified that I am speaking with the correct person using two identifiers.  Location: Patient: home Provider: Clinic   I discussed the limitations, risks, security and privacy concerns of performing an evaluation and management service by telephone and the availability of in person appointments. I also discussed with the patient that there may be a patient responsible charge related to this service. The patient expressed understanding and agreed to proceed.   I provided 30 minutes of non-face-to-face time during this encounter.      07/14/2021 1:43 PM Tanya Nolan  MRN:  [id number]  Chief Complaint: "I am not as down as I was"  HPI: 45 year old female seen today for follow up psychiatric evaluation.  She has a history of major depression.  She is currently being managed on Zoloft 100 mg daily, trazodone 50 mg nightly and Abilify 5 mg daily.  She notes her medications are effective in managing her psychiatric conditions.   Today patient was unable to login virtually so assessment was done over the phone.  During exam she was pleasant, cooperative, and engaged in conversation. She informed Probation officer that she is not as down as she was at her last visit.  She informed Probation officer that her anxiety and depression has improved.  Provider conducted a GAD-7 and patient scored a 3.  Provider also conducted PHQ-9 and patient scored a 4.  She endorses adequate sleep and appetite.  Today she denies SI/HI/VAH, mania, or paranoia.    No medication changes made today.  Patient agreeable to continue medications as prescribed.  She will follow-up with outpatient counseling for therapy.  No other concerns at this time.      Visit Diagnosis:    ICD-10-CM   1. Mild depression (HCC)  F32.0 ARIPiprazole (ABILIFY) 5 MG tablet    sertraline (ZOLOFT) 100 MG tablet    traZODone  (DESYREL) 50 MG tablet    2. Major depressive disorder, single episode, severe with psychotic features ([place])  F32.3       Past Psychiatric History:  depression   Past Medical History:  Past Medical History:  Diagnosis Date   Abdominal pain 03/26/2009   Qualifier: Diagnosis of  By: Jerline Pain MD, [place], LUMBAR 03/26/2009   Qualifier: Diagnosis of  By: Jerline Pain MD, [place]     [place] 10/01/2007   Qualifier: Diagnosis of  By: Genene Churn MD, Jessica     Chronic back pain    tx with ibuprofen   Diabetes mellitus    Hypercholesteremia    Hyperlipidemia    no meds - tx with diet   Hypertension    Hypothyroidism    Ingrown toenail 12/25/2014   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   C&B WNL  NEG PAPS after   MDD (major depressive disorder), recurrent severe, without psychosis ([place]) 03/15/2020   Nipple discharge 10/13/2013   Non compliance w medication regimen 10/13/2013   Simple endometrial hyperplasia 09/2006   BENIGN SECRETORY 02/2007   Thyroid dysfunction     Past Surgical History:  Procedure Laterality Date   [place] DECOMPRESSION  2008   HYSTEROSCOPY X 2  2007 / 2012   ENDOMETRIAL POLYPS REMOVED    Family Psychiatric History: Mother depression  Family History:  Family History  Problem Relation Age of Onset   Hypertension Sister  Lupus Mother    Hypertension Mother    Heart disease Father    Gout Father    Lupus Father     Social History:  Social History   Socioeconomic History   Marital status: Widowed    Spouse name: Not on file   Number of children: Not on file   Years of education: Not on file   Highest education level: Not on file  Occupational History   Not on file  Tobacco Use   Smoking status: Every Day    Packs/day: 1.00    Years: 8.00    Pack years: 8.00    Types: Cigarettes   Smokeless tobacco: Never  Vaping Use   Vaping Use: Never used  Substance and Sexual Activity   Alcohol use: Yes     Alcohol/week: 0.0 standard drinks    Comment: socially - wine/liquor--Rare   Drug use: Yes    Frequency: 7.0 times per week    Types: Marijuana   Sexual activity: Yes    Birth control/protection: None    Comment: 1st intercourse 45 yo-More than 5 partners  Other Topics Concern   Not on file  Social History Narrative   Not on file   Social Determinants of Health   Financial Resource Strain: Not on file  Food Insecurity: Not on file  Transportation Needs: Not on file  Physical Activity: Not on file  Stress: Not on file  Social Connections: Not on file    Allergies: No Known Allergies  Metabolic Disorder Labs: Lab Results  Component Value Date   HGBA1C 6.9 05/17/2021   MPG 171.42 03/16/2020   MPG 171.42 03/16/2020   No results found for: PROLACTIN Lab Results  Component Value Date   CHOL 210 (H) 05/17/2021   TRIG 172 (H) 05/17/2021   HDL 25 (L) 05/17/2021   CHOLHDL 8.4 (H) 05/17/2021   VLDL 21 03/16/2020   LDLCALC 153 (H) 05/17/2021   LDLCALC 162 (H) 03/16/2020   Lab Results  Component Value Date   TSH 2.530 05/17/2021   TSH 1.655 03/14/2020    Therapeutic Level Labs: No results found for: LITHIUM No results found for: VALPROATE No components found for:  CBMZ  Current Medications: Current Outpatient Medications  Medication Sig Dispense Refill   Accu-Chek FastClix Lancets MISC Use to check CBG TID 100 each 12   albuterol (PROAIR HFA) 108 (90 Base) MCG/ACT inhaler Inhale 1-2 puffs into the lungs every 6 (six) hours as needed for wheezing or shortness of breath. 18 g 1   ARIPiprazole (ABILIFY) 5 MG tablet Take 1 tablet (5 mg total) by mouth daily. For mood control 30 tablet 3   atorvastatin (LIPITOR) 80 MG tablet Take 1 tablet (80 mg total) by mouth daily. 90 tablet 1   BD ULTRA-FINE PEN NEEDLES 29G X 12.7MM MISC USE THREE TIMES A DAY BEFORE MEALS 1 each 2   Blood Glucose Monitoring Suppl (ACCU-CHEK GUIDE) w/Device KIT 1 applicator by Does not apply route 3  (three) times daily. Use to check CBG TID 1 kit 0   cephALEXin (KEFLEX) 500 MG capsule Take 1 capsule (500 mg total) by mouth 4 (four) times daily. 28 capsule 0   cetirizine (ZYRTEC) 10 MG tablet Take 1 tablet (10 mg total) by mouth daily. 30 tablet 11   empagliflozin (JARDIANCE) 10 MG TABS tablet Take 1 tablet (10 mg total) by mouth daily. 90 tablet 3   enalapril (VASOTEC) 2.5 MG tablet Take 1 tablet (2.5 mg total) by mouth  daily. For high blood pressure 90 tablet 1   ezetimibe (ZETIA) 10 MG tablet Take 1 tablet (10 mg total) by mouth daily. 90 tablet 1   glucose blood (ACCU-CHEK GUIDE) test strip USE TO CHECK GBG 3 TIMES A DAY 100 strip 1   hydrocortisone 2.5 % ointment Apply topically 2 (two) times daily. Limit use to one week 30 g 0   levothyroxine (SYNTHROID) 125 MCG tablet Take 1 tablet (125 mcg total) by mouth daily before breakfast. Reported on 02/23/2016 90 tablet 1   polyethylene glycol powder (GLYCOLAX/MIRALAX) 17 GM/SCOOP powder Take 17 g by mouth daily as needed. 3350 g 1   sertraline (ZOLOFT) 100 MG tablet Take 1 tablet (100 mg total) by mouth daily. For depression 30 tablet 3   traZODone (DESYREL) 50 MG tablet Take 1 tablet (50 mg total) by mouth at bedtime as needed for sleep. 30 tablet 3   No current facility-administered medications for this visit.     Musculoskeletal: Strength & Muscle Tone:  Unable to assess due to telephone visit Gait & Station:  Unable to assess due to telephone visit Patient leans: N/A  Psychiatric Specialty Exam: Review of Systems  There were no vitals taken for this visit.There is no height or weight on file to calculate BMI.  General Appearance: Unable to assess due to telephone visit  Eye Contact:   Unable to assess due to telephone visit  Speech:  Clear and Coherent and Normal Rate  Volume:  Normal  Mood:  Euthymic  Affect:  Appropriate, Congruent, and Tearful  Thought Process:  Coherent, Goal Directed and Linear  Orientation:  Full (Time,  Place, and Person)  Thought Content: WDL and Logical   Suicidal Thoughts:  No  Homicidal Thoughts:  No  Memory:  Immediate;   Good Recent;   Good Remote;   Good  Judgement:  Good  Insight:  Good  Psychomotor Activity:  Normal  Concentration:  Concentration: Good and Attention Span: Good  Recall:  Good  Fund of Knowledge: Good  Language: Good  Akathisia:  No  Handed:  Right  AIMS (if indicated): Not done  Assets:  Communication Skills Desire for Improvement Financial Resources/Insurance Housing Social Support  ADL's:  Intact  Cognition: WNL  Sleep:  Good   Screenings: [place] Admission (Discharged) from 03/15/2020 in [place] [facility]  AIMS Total Score 0      AUDIT    Flowsheet Row Admission (Discharged) from 03/15/2020 in [place] [facility]  Alcohol Use Disorder Identification Test Final Score (AUDIT) 1      GAD-7    Flowsheet Row Video Visit from 07/14/2021 in [facility] Video Visit from 02/14/2021 in [facility] Video Visit from 11/15/2020 in [facility]  Total GAD-7 Score _0 PHQ2-9    Flowsheet Row Video Visit from 07/14/2021 in [facility] Office Visit from 05/17/2021 in [place] Video Visit from 02/14/2021 in [facility] Office Visit from 01/05/2021 in [place] Video Visit from 11/15/2020 in [facility]  PHQ-2 Total Score _1 PHQ-9 Total Score _2 Flowsheet Row ED from 06/09/2021 in [facility] Video Visit from 02/14/2021 in [facility] Admission (Discharged)  from 03/15/2020 in [place] [facility]  C-SSRS RISK CATEGORY No Risk Error: Q7 should not be populated when Q6 is No High  Risk        Assessment and Plan: Patient notes that she is doing well on her current medication regimen.  No medication changes made today.  Patient agreeable to continue medication as prescribed.  2. Mild Depression   Continue- sertraline (ZOLOFT) 100 MG tablet; Take 1 tablet (100 mg total) by mouth daily. For depression  Dispense: 30 tablet; Refill: 2 Continue- traZODone (DESYREL) 50 MG tablet; Take 1 tablet (50 mg total) by mouth at bedtime as needed for sleep.  Dispense: 30 tablet; Refill: 2 Continue- ARIPiprazole (ABILIFY) 5 MG tablet; Take 1 tablet (5 mg total) by mouth daily. For mood control  Dispense: 30 tablet; Refill: 2  Follow-up in 3 months Follow-up with therapy  Salley Slaughter, NP 07/14/2021, 1:43 PM

## 2021-07-26 ENCOUNTER — Ambulatory Visit: Payer: Medicaid Other

## 2021-07-27 NOTE — Chronic Care Management (AMB) (Signed)
Chronic Care Management   CCM RN Visit Note  07/27/2021 Name: Tanya Nolan MRN: [id number] DOB: [birth year]  Subjective: Tanya Nolan is a 45 y.o. year old female who is a primary care patient of [name], MD. The care management team was consulted for assistance with disease management and care coordination needs.    Engaged with patient by telephone for follow up visit in response to provider referral for case management and/or care coordination services.   Consent to Services:  The patient was given information about Chronic Care Management services, agreed to services, and gave verbal consent prior to initiation of services.  Please see initial visit note for detailed documentation.   Patient agreed to services and verbal consent obtained. No   Assessment:  The patient continues to experience difficulty with staying consistant checking her blood sugar.. See Care Plan below for interventions and patient self-care actives. Follow up Plan: Patient would like continued follow-up.  CCM RNCM will outreach the patient within the next 5 weeks.  Patient will call office if needed prior to next encounter  Review of patient past medical history, allergies, medications, health status, including review of consultants reports, laboratory and other test data, was performed as part of comprehensive evaluation and provision of chronic care management services.   SDOH (Social Determinants of Health) assessments and interventions performed:    CCM Care Plan  No Known Allergies  Outpatient Encounter Medications as of 07/26/2021  Medication Sig Note   Accu-Chek FastClix Lancets MISC Use to check CBG TID    albuterol (PROAIR HFA) 108 (90 Base) MCG/ACT inhaler Inhale 1-2 puffs into the lungs every 6 (six) hours as needed for wheezing or shortness of breath.    ARIPiprazole (ABILIFY) 5 MG tablet Take 1 tablet (5 mg total) by mouth daily. For mood control    atorvastatin (LIPITOR) 80 MG tablet  Take 1 tablet (80 mg total) by mouth daily.    BD ULTRA-FINE PEN NEEDLES 29G X 12.7MM MISC USE THREE TIMES A DAY BEFORE MEALS    Blood Glucose Monitoring Suppl (ACCU-CHEK GUIDE) w/Device KIT 1 applicator by Does not apply route 3 (three) times daily. Use to check CBG TID    cephALEXin (KEFLEX) 500 MG capsule Take 1 capsule (500 mg total) by mouth 4 (four) times daily.    cetirizine (ZYRTEC) 10 MG tablet Take 1 tablet (10 mg total) by mouth daily.    empagliflozin (JARDIANCE) 10 MG TABS tablet Take 1 tablet (10 mg total) by mouth daily.    enalapril (VASOTEC) 2.5 MG tablet Take 1 tablet (2.5 mg total) by mouth daily. For high blood pressure    ezetimibe (ZETIA) 10 MG tablet Take 1 tablet (10 mg total) by mouth daily.    glucose blood (ACCU-CHEK GUIDE) test strip USE TO CHECK GBG 3 TIMES A DAY    hydrocortisone 2.5 % ointment Apply topically 2 (two) times daily. Limit use to one week 06/06/2021: As needed   levothyroxine (SYNTHROID) 125 MCG tablet Take 1 tablet (125 mcg total) by mouth daily before breakfast. Reported on 02/23/2016    polyethylene glycol powder (GLYCOLAX/MIRALAX) 17 GM/SCOOP powder Take 17 g by mouth daily as needed. 06/06/2021: As needed   sertraline (ZOLOFT) 100 MG tablet Take 1 tablet (100 mg total) by mouth daily. For depression    traZODone (DESYREL) 50 MG tablet Take 1 tablet (50 mg total) by mouth at bedtime as needed for sleep.    No facility-administered encounter medications on file as  of 07/26/2021.    Patient Active Problem List   Diagnosis Date Noted   Mild depression ([place]) 07/14/2021   Nodule of skin of left foot 04/15/2021   Incontinence in female 10/05/2020   Major depressive disorder, single episode, severe with psychotic features ([place]) 08/13/2020   Grief 11/10/2019   Tobacco abuse 07/15/2019   Chronic back pain 09/19/2016   Hemorrhoids 02/02/2012   Hypothyroidism 01/10/2007   Type 2 diabetes mellitus with hyperlipidemia ([place]) 01/10/2007   POLYCYSTIC OVARY  01/10/2007   Hyperlipidemia 01/10/2007   HYPERTENSION, BENIGN SYSTEMIC 01/10/2007    Conditions to be addressed/monitored:DMII  Care Plan : RN Case Manager  Updates made by Lazaro Arms, RN since 07/27/2021 12:00 AM     Problem: Glycemic Management (Diabetes, Type 2)      Long-Range Goal: Glycemic Management   Start Date: 10/01/2020  Expected End Date: 09/12/2021  Recent Progress: Not on track  Priority: Medium  Note:   Objective:  Lab Results  Component Value Date   HGBA1C 6.9 05/17/2021   Lab Results  Component Value Date   CREATININE 0.86 06/09/2021   CREATININE 0.97 05/17/2021   CREATININE 0.74 03/23/2020  Current Barriers:  Knowledge Deficits related to basic Diabetes pathophysiology and self care/management Does not use cbg meter   Case Manager Clinical Goal(s):  Over the next 90 days, patient will demonstrate improved adherence to prescribed treatment plan for diabetes self care/management as evidenced by: by checking her blood sugars at least 1-2 times per week   Interventions:  Provided education to patient about basic DM disease process Discussed plans with patient for ongoing care management follow up and provided patient with direct contact information for care management team Advised patient, providing education and rationale, to check cbg  and record, calling the office  for findings outside established parameters.   Review of patient status, including review of consultants reports, relevant laboratory and other test results, and medications completed. Blood glucose monitoring encouraged use of blood glucose monitoring log promoted Discussed healthy eating 07/26/21:  The patient had not checked her blood sugar today but she states that she is feeling well.  She is taking her meds and still plans on checking her blood sugars once a week. She is eating but not as much as she should.  She states that she drink more and gets full.  Advised her to stay vigilant  with checking her blood sugars and try to eat and get her protein.  It is good to drink and stay hydrated but make sure she is drinking water to alleviate getting UTI infections.  She verbalized understanding.  Patient Goals/Self Care Activities:  Patient verbalizes understanding of plan Self-administers medications as prescribed Calls pharmacy for medication refills Call's provider office for new concerns or questions check blood sugar at prescribed times check blood sugar if I feel it is too high or too low take the blood sugar meter to all doctor visits Follow up and discuss issues with PCP about checking blood sugars       Problem: UTI      Goal: Patient will manage UTI symptoms by taking her medications and staying hydrated Completed 07/27/2021  Start Date: 06/28/2021  Expected End Date: 08/12/2021  Recent Progress: On track  Priority: High  Note:   Current Barriers:  Knowledge Deficits related to plan of care for management of UTI - The patient states that she became dizzy had a fall and hit her neck on her mother's shower chair.  She  went to the ER on 06/09/21 and found out that she had a UTI.  RNCM Clinical Goal(s):  Patient will verbalize understanding of plan for management of UTI  through collaboration with RN Care manager, provider, and care team.   Interventions: 1:1 collaboration with primary care provider regarding development and update of comprehensive plan of care as evidenced by provider attestation and co-signature Inter-disciplinary care team collaboration (see longitudinal plan of care) Evaluation of current treatment plan related to  self management and patient's adherence to plan as established by provider   (Status: New goal.) Evaluation of current treatment plan related to  UTI , self-management and patient's adherence to plan as established by provider. Discussed plans with patient for ongoing care management follow up and provided patient with direct  contact information for care management team Advised patient to stay hydrated drinking more water cutting back on the sodas and juice.   Reviewed her medications and discussed the importance of taking all of her antibiotics. She verbalized understanding.   Patient states that she has no UTI symptoms and has taken all of her medications. Discussed how to stay hydrated and  stay away from sodas and juice not to get UTI infections.  She verbalized understanding.  Patient Goals/Self-Care Activities: Patient will self administer medications as prescribed Patient will attend all scheduled provider appointments Patient will call pharmacy for medication refills Patient will call provider office for new concerns or questions Patient will take all of her antibiotics       Lazaro Arms RN, BSN, [facility] Care Management Coordinator [place] Phone: [phone] I Fax: [phone]

## 2021-07-27 NOTE — Patient Instructions (Signed)
Visit Information  Ms. Vongphakdy  it was nice speaking with you. Please call me directly [phone] if you have questions about the goals we discussed.   Goals Addressed               This Visit's Progress     I don't check my blood sugars. (pt-stated)        Timeframe:  Long-Range Goal Priority:  High Start Date:         05/20/20                    Expected End Date:   09/12/21                Patient Goals/Self Care Activities:  Patient verbalizes understanding of plan Self-administers medications as prescribed Calls pharmacy for medication refills Call's provider office for new concerns or questions check blood sugar at prescribed times       The patient verbalizes understanding of the information and instructions discussed today.  Our next appointment is scheduled for  08/31/21.   Please feel free to call me or the office if you have any questions or concerns.  Lazaro Arms RN, BSN, [facility] Care Management Coordinator [place] Phone: [phone] I Fax: [phone]

## 2021-07-29 ENCOUNTER — Other Ambulatory Visit (HOSPITAL_COMMUNITY): Payer: Self-pay

## 2021-08-01 ENCOUNTER — Ambulatory Visit: Payer: Medicaid Other

## 2021-08-01 ENCOUNTER — Other Ambulatory Visit: Payer: Self-pay

## 2021-08-01 ENCOUNTER — Ambulatory Visit
Admission: RE | Admit: 2021-08-01 | Discharge: 2021-08-01 | Disposition: A | Discharge status: Home / Self Care | Payer: Medicaid Other | Source: Ambulatory Visit | Attending: Family Medicine | Admitting: Family Medicine

## 2021-08-01 DIAGNOSIS — Z1231 Encounter for screening mammogram for malignant neoplasm of breast: Secondary | ICD-10-CM

## 2021-08-31 ENCOUNTER — Ambulatory Visit: Payer: Medicaid Other

## 2021-08-31 NOTE — Patient Instructions (Signed)
Visit Information  Tanya Nolan  it was nice speaking with you. Please call me directly [phone] if you have questions about the goals we discussed.   Goals Addressed               This Visit's Progress     I don't check my blood sugars. (pt-stated)        Timeframe:  Long-Range Goal Priority:  High Start Date:         05/20/20                    Expected End Date:   10/12/21                Patient Goals/Self Care Activities:  Patient verbalizes understanding of plan Self-administers medications as prescribed Calls pharmacy for medication refills Call's provider office for new concerns or questions check blood sugar at prescribed times        The patient verbalized understanding of instructions, educational materials, and care plan provided today and declined offer to receive copy of patient instructions, educational materials, and care plan.   Follow up Plan: Patient would like continued follow-up.  CCM RNCM will outreach the patient within the next 4 weeks.  Patient will call office if needed prior to next encounter  Lazaro Arms, RN  [phone]

## 2021-08-31 NOTE — Chronic Care Management (AMB) (Signed)
Chronic Care Management   CCM RN Visit Note  08/31/2021 Name: Tanya Nolan MRN: [id number] DOB: 12/05/1975  Subjective: Tanya Nolan is a 45 y.o. year old female who is a primary care patient of [name], MD. The care management team was consulted for assistance with disease management and care coordination needs.    Engaged with patient by telephone for follow up visit in response to provider referral for case management and/or care coordination services.   Consent to Services:  The patient was given information about Chronic Care Management services, agreed to services, and gave verbal consent prior to initiation of services.  Please see initial visit note for detailed documentation.   Patient agreed to services and verbal consent obtained.    Assessment: Patient is currently experiencing symptoms of  elevated blood sugars which seems to be exacerbated by not taking her medications and and eating fast foods . See Care Plan below for interventions and patient self-care actives. Follow up Plan: Patient would like continued follow-up.  CCM RNCM will outreach the patient within the next 4 weeks.  Patient will call office if needed prior to next encounter  Review of patient past medical history, allergies, medications, health status, including review of consultants reports, laboratory and other test data, was performed as part of comprehensive evaluation and provision of chronic care management services.   SDOH (Social Determinants of Health) assessments and interventions performed:    CCM Care Plan  No Known Allergies  Outpatient Encounter Medications as of 08/31/2021  Medication Sig Note   Accu-Chek FastClix Lancets MISC Use to check CBG TID    albuterol (PROAIR HFA) 108 (90 Base) MCG/ACT inhaler Inhale 1-2 puffs into the lungs every 6 (six) hours as needed for wheezing or shortness of breath.    ARIPiprazole (ABILIFY) 5 MG tablet Take 1 tablet (5 mg total) by mouth daily.  For mood control    atorvastatin (LIPITOR) 80 MG tablet Take 1 tablet (80 mg total) by mouth daily.    BD ULTRA-FINE PEN NEEDLES 29G X 12.7MM MISC USE THREE TIMES A DAY BEFORE MEALS    Blood Glucose Monitoring Suppl (ACCU-CHEK GUIDE) w/Device KIT 1 applicator by Does not apply route 3 (three) times daily. Use to check CBG TID    cephALEXin (KEFLEX) 500 MG capsule Take 1 capsule (500 mg total) by mouth 4 (four) times daily.    cetirizine (ZYRTEC) 10 MG tablet Take 1 tablet (10 mg total) by mouth daily.    empagliflozin (JARDIANCE) 10 MG TABS tablet Take 1 tablet (10 mg total) by mouth daily.    enalapril (VASOTEC) 2.5 MG tablet Take 1 tablet (2.5 mg total) by mouth daily. For high blood pressure    ezetimibe (ZETIA) 10 MG tablet Take 1 tablet (10 mg total) by mouth daily.    glucose blood (ACCU-CHEK GUIDE) test strip USE TO CHECK GBG 3 TIMES A DAY    hydrocortisone 2.5 % ointment Apply topically 2 (two) times daily. Limit use to one week 06/06/2021: As needed   levothyroxine (SYNTHROID) 125 MCG tablet Take 1 tablet (125 mcg total) by mouth daily before breakfast. Reported on 02/23/2016    polyethylene glycol powder (GLYCOLAX/MIRALAX) 17 GM/SCOOP powder Take 17 g by mouth daily as needed. 06/06/2021: As needed   sertraline (ZOLOFT) 100 MG tablet Take 1 tablet (100 mg total) by mouth daily. For depression    traZODone (DESYREL) 50 MG tablet Take 1 tablet (50 mg total) by mouth at bedtime as needed  for sleep.    [DISCONTINUED] insulin lispro protamine-insulin lispro (HUMALOG 75/25) (75-25) 100 UNIT/ML SUSP Inject 40-50 Units into the skin 2 (two) times daily with a meal. Patient takes 40 units in the morning and 50 units at night.  Verified it is Humalog 75/25. 11/02/2011: Took 5 units as order by anesthesia at 1115  11/02/2011   No facility-administered encounter medications on file as of 08/31/2021.    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   Nodule of skin  of left foot 04/15/2021   Incontinence in female 10/05/2020   Major depressive disorder, single episode, severe with psychotic features ([place]) 08/13/2020   Grief 11/10/2019   Tobacco abuse 07/15/2019   Chronic back pain 09/19/2016   Hemorrhoids 02/02/2012   Hypothyroidism 01/10/2007   Type 2 diabetes mellitus with hyperlipidemia ([place]) 01/10/2007   POLYCYSTIC OVARY 01/10/2007   Hyperlipidemia 01/10/2007   HYPERTENSION, BENIGN SYSTEMIC 01/10/2007    Conditions to be addressed/monitored:DMII  Care Plan : RN Case Manager  Updates made by Lazaro Arms, RN since 08/31/2021 12:00 AM     Problem: Glycemic Management (Diabetes, Type 2)      Long-Range Goal: Glycemic Management   Start Date: 10/01/2020  Expected End Date: 09/12/2021  Recent Progress: Not on track  Priority: Medium  Note:   Objective:  Lab Results  Component Value Date   HGBA1C 6.9 05/17/2021   Lab Results  Component Value Date   CREATININE 0.86 06/09/2021   CREATININE 0.97 05/17/2021   CREATININE 0.74 03/23/2020  Current Barriers:  Knowledge Deficits related to basic Diabetes pathophysiology and self care/management Does not use cbg meter   Case Manager Clinical Goal(s):  Over the next 90 days, patient will demonstrate improved adherence to prescribed treatment plan for diabetes self care/management as evidenced by: by checking her blood sugars at least 1-2 times per week   Interventions:  Provided education to patient about basic DM disease process Discussed plans with patient for ongoing care management follow up and provided patient with direct contact information for care management team Advised patient, providing education and rationale, to check cbg  and record, calling the office  for findings outside established parameters.   Review of patient status, including review of consultants reports, relevant laboratory and other test results, and medications completed. Blood glucose monitoring encouraged use  of blood glucose monitoring log promoted Discussed healthy eating 08/31/21:  I spoke with the patient today; she has not taken her medications. Her blood sugars have been in the two hundred, and when asked why it has been hard to take her medications, she had no specific answer. Her explanation for the elevation in sugars is that she has been eating more junk food and sweets. We discussed eating the correct food and drinking more water. She did say that she has more urination and urgency that it may be a UTI. She is out of town but coming back sometime today. I advised her to call the office and get an appointment to get checked. The plan for our next visit is that she will at least check her blood sugars once a week, start her medications back as prescribed, and eat more food, including protein, vegetables, and drinking water. She verbalized understanding.   Patient Goals/Self Care Activities:  Patient verbalizes understanding of plan Self-administers medications as prescribed Calls pharmacy for medication refills Call's provider office for new concerns or questions check blood sugar at prescribed times check blood sugar if  I feel it is too high or too low take the blood sugar meter to all doctor visits Follow up and discuss issues with PCP about checking blood sugars        Lazaro Arms RN, BSN, [place] Phone: [phone] I Fax: [phone]

## 2021-09-28 ENCOUNTER — Ambulatory Visit: Payer: Medicaid Other

## 2021-09-29 NOTE — Patient Instructions (Signed)
Visit Information  Ms. Staten  it was nice speaking with you. Please call me directly [phone] if you have questions about the goals we discussed.  Patient Goals/Self Care Activities: -Patient/Caregiver will self-administer medications as prescribed as evidenced by self-report/primary caregiver report , -Patient/Caregiver will attend all scheduled provider appointments as evidenced by clinician review of documented attendance to scheduled appointments and patient/caregiver report, -Patient/Caregiver will call pharmacy for medication refills as evidenced by patient report and review of pharmacy fill history as appropriate,  -Patient/Caregiver will call provider office for new concerns or questions as evidenced by review of documented incoming telephone call notes and patient report,  -Patient/Caregiver verbalizes understanding of plan, and  -Patient/Caregiver will focus on medication adherence by take medication as prescribed. -check blood sugar at prescribed times -check blood sugar if I feel it is too high or too low    The patient verbalized understanding of instructions, educational materials, and care plan provided today and declined offer to receive copy of patient instructions, educational materials, and care plan.   Follow up Plan: RNCM will remain available for 30 days.  If no further needs are assessed at this time [facility] will be removed from care team.  Lazaro Arms, RN  [phone]

## 2021-09-29 NOTE — Chronic Care Management (AMB) (Signed)
Chronic Care Management   CCM RN Visit Note  09/29/2021 Name: Tanya Nolan MRN: [id number] DOB: 1976/02/09  Subjective: Tanya Nolan is a 45 y.o. year old female who is a primary care patient of Tanya Feil, MD. The care management team was consulted for assistance with disease management and care coordination needs.    Engaged with patient by telephone for follow up visit in response to provider referral for case management and/or care coordination services.   Consent to Services:  The patient was given information about Chronic Care Management services, agreed to services, and gave verbal consent prior to initiation of services.  Please see initial visit note for detailed documentation.   Patient agreed to services and verbal consent obtained.    Assessment:  The patient is not currently checking her blood sugars.  She is not having any syptoms. . See Care Plan below for interventions and patient self-care actives. Follow up Plan: RNCM will remain available for 30 days.  If no further needs are assessed at this time RNCM will be removed from care team. Review of patient past medical history, allergies, medications, health status, including review of consultants reports, laboratory and other test data, was performed as part of comprehensive evaluation and provision of chronic care management services.   SDOH (Social Determinants of Health) assessments and interventions performed:    CCM Care Plan  No Known Allergies  Outpatient Encounter Medications as of 09/28/2021  Medication Sig Note   Accu-Chek FastClix Lancets MISC Use to check CBG TID    albuterol (PROAIR HFA) 108 (90 Base) MCG/ACT inhaler Inhale 1-2 puffs into the lungs every 6 (six) hours as needed for wheezing or shortness of breath.    ARIPiprazole (ABILIFY) 5 MG tablet Take 1 tablet (5 mg total) by mouth daily. For mood control    atorvastatin (LIPITOR) 80 MG tablet Take 1 tablet (80 mg total) by mouth daily.     BD ULTRA-FINE PEN NEEDLES 29G X 12.7MM MISC USE THREE TIMES A DAY BEFORE MEALS    Blood Glucose Monitoring Suppl (ACCU-CHEK GUIDE) w/Device KIT 1 applicator by Does not apply route 3 (three) times daily. Use to check CBG TID    cephALEXin (KEFLEX) 500 MG capsule Take 1 capsule (500 mg total) by mouth 4 (four) times daily.    cetirizine (ZYRTEC) 10 MG tablet Take 1 tablet (10 mg total) by mouth daily.    empagliflozin (JARDIANCE) 10 MG TABS tablet Take 1 tablet (10 mg total) by mouth daily.    enalapril (VASOTEC) 2.5 MG tablet Take 1 tablet (2.5 mg total) by mouth daily. For high blood pressure    ezetimibe (ZETIA) 10 MG tablet Take 1 tablet (10 mg total) by mouth daily.    glucose blood (ACCU-CHEK GUIDE) test strip USE TO CHECK GBG 3 TIMES A DAY    hydrocortisone 2.5 % ointment Apply topically 2 (two) times daily. Limit use to one week 06/06/2021: As needed   levothyroxine (SYNTHROID) 125 MCG tablet Take 1 tablet (125 mcg total) by mouth daily before breakfast. Reported on 02/23/2016    polyethylene glycol powder (GLYCOLAX/MIRALAX) 17 GM/SCOOP powder Take 17 g by mouth daily as needed. 06/06/2021: As needed   sertraline (ZOLOFT) 100 MG tablet Take 1 tablet (100 mg total) by mouth daily. For depression    traZODone (DESYREL) 50 MG tablet Take 1 tablet (50 mg total) by mouth at bedtime as needed for sleep.    No facility-administered encounter medications on file as of  09/28/2021.    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   Nodule of skin of left foot 04/15/2021   Incontinence in female 10/05/2020   Major depressive disorder, single episode, severe with psychotic features ([place]) 08/13/2020   Grief 11/10/2019   Tobacco abuse 07/15/2019   Chronic back pain 09/19/2016   Hemorrhoids 02/02/2012   Hypothyroidism 01/10/2007   Type 2 diabetes mellitus with hyperlipidemia ([place]) 01/10/2007   POLYCYSTIC OVARY 01/10/2007   Hyperlipidemia 01/10/2007    HYPERTENSION, BENIGN SYSTEMIC 01/10/2007    Conditions to be addressed/monitored:DMII  Care Plan : RN Case Manager  Updates made by Lazaro Arms, RN since 09/29/2021 12:00 AM     Problem: Glycemic Management (Diabetes, Type 2)      Long-Range Goal: Glycemic Management   Start Date: 10/01/2020  Expected End Date: 11/11/2021  Recent Progress: Not on track  Priority: Medium  Note:   Objective:  Lab Results  Component Value Date   HGBA1C 6.9 05/17/2021   Lab Results  Component Value Date   CREATININE 0.86 06/09/2021   CREATININE 0.97 05/17/2021   CREATININE 0.74 03/23/2020  Current Barriers:  Knowledge Deficits related to basic Diabetes pathophysiology and self care/management Does not use cbg meter   Case Manager Clinical Goal(s):  Over the next 90 days, patient will demonstrate improved adherence to prescribed treatment plan for diabetes self care/management as evidenced by: by checking her blood sugars at least 1-2 times per week   Interventions:  Provided education to patient about basic DM disease process Discussed plans with patient for ongoing care management follow up and provided patient with direct contact information for care management team Advised patient, providing education and rationale, to check cbg  and record, calling the office  for findings outside established parameters.   Review of patient status, including review of consultants reports, relevant laboratory and other test results, and medications completed. Blood glucose monitoring encouraged use of blood glucose monitoring log promoted Discussed healthy eating 09/28/21:  I spoke with Tanya Nolan; she is not checking her blood sugars and has not quite a while. She states that she is feeling fine and taking her medications as prescribed. Sticking her finger is not one of her favorite things. I asked if we could do it once a week, and she said she would not do that either. I told her that I would remain open on  her case for thirty days if she needed me to call; if not, I would remove myself from the care team, and she agreed. I told her that if she needed my assistance, I would notify her PCP, and they could place a referral, and I would be happy to help.  Patient Goals/Self Care Activities: -Patient/Caregiver will self-administer medications as prescribed as evidenced by self-report/primary caregiver report , -Patient/Caregiver will attend all scheduled provider appointments as evidenced by clinician review of documented attendance to scheduled appointments and patient/caregiver report, -Patient/Caregiver will call pharmacy for medication refills as evidenced by patient report and review of pharmacy fill history as appropriate,  -Patient/Caregiver will call provider office for new concerns or questions as evidenced by review of documented incoming telephone call notes and patient report,  -Patient/Caregiver verbalizes understanding of plan, and  -Patient/Caregiver will focus on medication adherence by take medication as prescribed. -check blood sugar at prescribed times -check blood sugar if I feel it is too high or too low        Tanya Nolan, BSN, [facility]  Care Management Coordinator [place] Phone: [phone] I Fax: [phone]

## 2021-10-20 ENCOUNTER — Telehealth (INDEPENDENT_AMBULATORY_CARE_PROVIDER_SITE_OTHER): Payer: Medicaid Other | Admitting: Psychiatry

## 2021-10-20 ENCOUNTER — Encounter (HOSPITAL_COMMUNITY): Payer: Self-pay | Admitting: Psychiatry

## 2021-10-20 DIAGNOSIS — F32A Depression, unspecified: Secondary | ICD-10-CM

## 2021-10-20 MED ORDER — ARIPIPRAZOLE 5 MG PO TABS: 5.0000 mg | ORAL_TABLET | Freq: Every day | ORAL | 3 refills | Status: DC

## 2021-10-20 MED ORDER — TRAZODONE HCL 50 MG PO TABS: 50.0000 mg | ORAL_TABLET | Freq: Every evening | ORAL | 3 refills | Status: DC | PRN

## 2021-10-20 MED ORDER — SERTRALINE HCL 100 MG PO TABS: 100.0000 mg | ORAL_TABLET | Freq: Every day | ORAL | 3 refills | Status: DC

## 2021-10-20 NOTE — Progress Notes (Signed)
[place] MD/PA/NP OP Progress Note Virtual Visit via Telephone Note  I connected with Tanya Nolan on 10/20/21 at  1:00 PM EST by telephone and verified that I am speaking with the correct person using two identifiers.  Location: Patient: home Provider: Clinic   I discussed the limitations, risks, security and privacy concerns of performing an evaluation and management service by telephone and the availability of in person appointments. I also discussed with the patient that there may be a patient responsible charge related to this service. The patient expressed understanding and agreed to proceed.   I provided 15 minutes of non-face-to-face time during this encounter.      10/20/2021 1:19 PM Tanya Nolan  MRN:  [id number]  Chief Complaint: "My sleep is up and down"  HPI: 45 year old female seen today for follow up psychiatric evaluation.  She has a history of major depression.  She is currently being managed on Zoloft 100 mg daily, trazodone 50 mg nightly as needed, and Abilify 5 mg daily.  She notes her medications are somewhat effective in managing her psychiatric conditions.   Today patient was unable to login virtually so assessment was done over the phone.  During exam patients phone would go in and out and her visit was cut short. During visit she reports that here sleep has been up and  down. She notes that at times she sleeps 3-4 hour and on other days she sleeps all day. Provider recommended increasing Trazodone to 50-100 mg which patient was agreeable to. She also reported that he anxiety and depression continues to be well managed. Provider unable to conduct a GAD 7 or a PHQ 9 as patient had poor cellular service. She endorsed having an adequate appetite and informed writer that she has been losing weight. Today she denies SI/HI/VAH, mania, or paranoia.  Today she will increase Trazodone 50 mg nightly to 50-100 mg nightly as needed/ She will continue all other medications as  prescribed. She will follow-up with outpatient counseling for therapy.  No other concerns at this time.      Visit Diagnosis:    ICD-10-CM   1. Mild depression  F32.A ARIPiprazole (ABILIFY) 5 MG tablet    sertraline (ZOLOFT) 100 MG tablet    traZODone (DESYREL) 50 MG tablet    DISCONTINUED: traZODone (DESYREL) 50 MG tablet      Past Psychiatric History:  depression   Past Medical History:  Past Medical History:  Diagnosis Date   Abdominal pain 03/26/2009   Qualifier: Diagnosis of  By: Jerline Pain MD, [place], LUMBAR 03/26/2009   Qualifier: Diagnosis of  By: Jerline Pain MD, [place]     [place] 10/01/2007   Qualifier: Diagnosis of  By: Genene Churn MD, Jessica     Chronic back pain    tx with ibuprofen   Diabetes mellitus    Hypercholesteremia    Hyperlipidemia    no meds - tx with diet   Hypertension    Hypothyroidism    Ingrown toenail 12/25/2014   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   C&B WNL  NEG PAPS after   MDD (major depressive disorder), recurrent severe, without psychosis ([place]) 03/15/2020   Nipple discharge 10/13/2013   Non compliance w medication regimen 10/13/2013   Simple endometrial hyperplasia 09/2006   BENIGN SECRETORY 02/2007   Thyroid dysfunction     Past Surgical History:  Procedure Laterality Date   [place] DECOMPRESSION  2008   HYSTEROSCOPY  X 2  2007 / 2012   ENDOMETRIAL POLYPS REMOVED    Family Psychiatric History: Mother depression  Family History:  Family History  Problem Relation Age of Onset   Lupus Mother    Hypertension Mother    Heart disease Father    Gout Father    Lupus Father    Hypertension Sister    Breast cancer Neg Hx     Social History:  Social History   Socioeconomic History   Marital status: Widowed    Spouse name: Not on file   Number of children: Not on file   Years of education: Not on file   Highest education level: Not on file  Occupational History   Not on file  Tobacco  Use   Smoking status: Every Day    Packs/day: 1.00    Years: 8.00    Pack years: 8.00    Types: Cigarettes   Smokeless tobacco: Never  Vaping Use   Vaping Use: Never used  Substance and Sexual Activity   Alcohol use: Yes    Alcohol/week: 0.0 standard drinks    Comment: socially - wine/liquor--Rare   Drug use: Yes    Frequency: 7.0 times per week    Types: Marijuana   Sexual activity: Yes    Birth control/protection: None    Comment: 1st intercourse 45 yo-More than 5 partners  Other Topics Concern   Not on file  Social History Narrative   Not on file   Social Determinants of Health   Financial Resource Strain: Not on file  Food Insecurity: Not on file  Transportation Needs: Not on file  Physical Activity: Not on file  Stress: Not on file  Social Connections: Not on file    Allergies: No Known Allergies  Metabolic Disorder Labs: Lab Results  Component Value Date   HGBA1C 6.9 05/17/2021   MPG 171.42 03/16/2020   MPG 171.42 03/16/2020   No results found for: PROLACTIN Lab Results  Component Value Date   CHOL 210 (H) 05/17/2021   TRIG 172 (H) 05/17/2021   HDL 25 (L) 05/17/2021   CHOLHDL 8.4 (H) 05/17/2021   VLDL 21 03/16/2020   LDLCALC 153 (H) 05/17/2021   LDLCALC 162 (H) 03/16/2020   Lab Results  Component Value Date   TSH 2.530 05/17/2021   TSH 1.655 03/14/2020    Therapeutic Level Labs: No results found for: LITHIUM No results found for: VALPROATE No components found for:  CBMZ  Current Medications: Current Outpatient Medications  Medication Sig Dispense Refill   Accu-Chek FastClix Lancets MISC Use to check CBG TID 100 each 12   albuterol (PROAIR HFA) 108 (90 Base) MCG/ACT inhaler Inhale 1-2 puffs into the lungs every 6 (six) hours as needed for wheezing or shortness of breath. 18 g 1   ARIPiprazole (ABILIFY) 5 MG tablet Take 1 tablet (5 mg total) by mouth daily. For mood control 30 tablet 3   atorvastatin (LIPITOR) 80 MG tablet Take 1 tablet (80  mg total) by mouth daily. 90 tablet 1   BD ULTRA-FINE PEN NEEDLES 29G X 12.7MM MISC USE THREE TIMES A DAY BEFORE MEALS 1 each 2   Blood Glucose Monitoring Suppl (ACCU-CHEK GUIDE) w/Device KIT 1 applicator by Does not apply route 3 (three) times daily. Use to check CBG TID 1 kit 0   cephALEXin (KEFLEX) 500 MG capsule Take 1 capsule (500 mg total) by mouth 4 (four) times daily. 28 capsule 0   cetirizine (ZYRTEC) 10 MG tablet Take 1  tablet (10 mg total) by mouth daily. 30 tablet 11   empagliflozin (JARDIANCE) 10 MG TABS tablet Take 1 tablet (10 mg total) by mouth daily. 90 tablet 3   enalapril (VASOTEC) 2.5 MG tablet Take 1 tablet (2.5 mg total) by mouth daily. For high blood pressure 90 tablet 1   ezetimibe (ZETIA) 10 MG tablet Take 1 tablet (10 mg total) by mouth daily. 90 tablet 1   glucose blood (ACCU-CHEK GUIDE) test strip USE TO CHECK GBG 3 TIMES A DAY 100 strip 1   hydrocortisone 2.5 % ointment Apply topically 2 (two) times daily. Limit use to one week 30 g 0   levothyroxine (SYNTHROID) 125 MCG tablet Take 1 tablet (125 mcg total) by mouth daily before breakfast. Reported on 02/23/2016 90 tablet 1   polyethylene glycol powder (GLYCOLAX/MIRALAX) 17 GM/SCOOP powder Take 17 g by mouth daily as needed. 3350 g 1   sertraline (ZOLOFT) 100 MG tablet Take 1 tablet (100 mg total) by mouth daily. For depression 30 tablet 3   traZODone (DESYREL) 50 MG tablet Take 1-2 tablets (50-100 mg total) by mouth at bedtime as needed for sleep. 60 tablet 3   No current facility-administered medications for this visit.     Musculoskeletal: Strength & Muscle Tone:  Unable to assess due to telephone visit Gait & Station:  Unable to assess due to telephone visit Patient leans: N/A  Psychiatric Specialty Exam: Review of Systems  There were no vitals taken for this visit.There is no height or weight on file to calculate BMI.  General Appearance: Unable to assess due to telephone visit  Eye Contact:   Unable to  assess due to telephone visit  Speech:  Clear and Coherent and Normal Rate  Volume:  Normal  Mood:  Euthymic  Affect:  Appropriate, Congruent, and Tearful  Thought Process:  Coherent, Goal Directed and Linear  Orientation:  Full (Time, Place, and Person)  Thought Content: WDL and Logical   Suicidal Thoughts:  No  Homicidal Thoughts:  No  Memory:  Immediate;   Good Recent;   Good Remote;   Good  Judgement:  Good  Insight:  Good  Psychomotor Activity:  Normal  Concentration:  Concentration: Good and Attention Span: Good  Recall:  Good  Fund of Knowledge: Good  Language: Good  Akathisia:  No  Handed:  Right  AIMS (if indicated): Not done  Assets:  Communication Skills Desire for Improvement Financial Resources/Insurance Housing Social Support  ADL's:  Intact  Cognition: WNL  Sleep:  Fair   Screenings: AIMS    Flowsheet Row Admission (Discharged) from 03/15/2020 in [place] [facility]  AIMS Total Score 0      AUDIT    Flowsheet Row Admission (Discharged) from 03/15/2020 in [place] [facility]  Alcohol Use Disorder Identification Test Final Score (AUDIT) 1      GAD-7    Flowsheet Row Video Visit from 07/14/2021 in [facility] Video Visit from 02/14/2021 in [facility] Video Visit from 11/15/2020 in [facility]  Total GAD-7 Score '3 11 5      ' PHQ2-9    Flowsheet Row Video Visit from 07/14/2021 in [facility] Office Visit from 05/17/2021 in [place] Video Visit from 02/14/2021 in [facility] Office Visit from 01/05/2021 in [place] Video Visit from 11/15/2020 in [facility]  PHQ-2 Total Score '3 2 3 1 2  ' PHQ-9 Total Score '4 9 7 5 3      ' Flowsheet Row ED from 06/09/2021 in [facility]  Video Visit from 02/14/2021 in [facility] Admission (Discharged) from 03/15/2020 in [place] [facility]  C-SSRS RISK CATEGORY No Risk Error: Q7 should not be populated when Q6 is No High Risk        Assessment and Plan: Patient endorses hypersomnia and insomnia however notes that her anxiety and depression are well managed. Today she is agreeable to increasing Trazodone 50 mg to 50-100 nightly as needed to help manage sleep. She will continue all other medications as prescribed.   2. Mild Depression   Continue- sertraline (ZOLOFT) 100 MG tablet; Take 1 tablet (100 mg total) by mouth daily. For depression  Dispense: 30 tablet; Refill: 3 Continue- traZODone (DESYREL) 5-100 MG tablet; Take 1-2 tablet (50-100 mg total) by mouth at bedtime as needed for sleep.  Dispense: 60 tablet; Refill: 3 Continue- ARIPiprazole (ABILIFY) 5 MG tablet; Take 1 tablet (5 mg total) by mouth daily. For mood control  Dispense: 30 tablet; Refill: 3  Follow-up in 3 months Follow-up with therapy  Salley Slaughter, NP 10/20/2021, 1:19 PM

## 2021-10-22 ENCOUNTER — Emergency Department (HOSPITAL_COMMUNITY): Payer: Medicaid Other

## 2021-10-22 ENCOUNTER — Other Ambulatory Visit: Payer: Self-pay

## 2021-10-22 ENCOUNTER — Emergency Department (HOSPITAL_COMMUNITY)
Admission: EM | Admit: 2021-10-22 | Discharge: 2021-10-22 | Disposition: A | Discharge status: Home / Self Care | Payer: Medicaid Other | Attending: Emergency Medicine | Admitting: Emergency Medicine

## 2021-10-22 ENCOUNTER — Encounter (HOSPITAL_COMMUNITY): Payer: Self-pay | Admitting: Emergency Medicine

## 2021-10-22 DIAGNOSIS — F17210 Nicotine dependence, cigarettes, uncomplicated: Secondary | ICD-10-CM | POA: Insufficient documentation

## 2021-10-22 DIAGNOSIS — R55 Syncope and collapse: Secondary | ICD-10-CM | POA: Insufficient documentation

## 2021-10-22 DIAGNOSIS — I10 Essential (primary) hypertension: Secondary | ICD-10-CM | POA: Diagnosis not present

## 2021-10-22 DIAGNOSIS — Z79899 Other long term (current) drug therapy: Secondary | ICD-10-CM | POA: Diagnosis not present

## 2021-10-22 DIAGNOSIS — R0781 Pleurodynia: Secondary | ICD-10-CM | POA: Diagnosis not present

## 2021-10-22 DIAGNOSIS — F419 Anxiety disorder, unspecified: Secondary | ICD-10-CM | POA: Diagnosis not present

## 2021-10-22 DIAGNOSIS — E039 Hypothyroidism, unspecified: Secondary | ICD-10-CM | POA: Insufficient documentation

## 2021-10-22 DIAGNOSIS — R079 Chest pain, unspecified: Secondary | ICD-10-CM

## 2021-10-22 DIAGNOSIS — Z794 Long term (current) use of insulin: Secondary | ICD-10-CM | POA: Diagnosis not present

## 2021-10-22 DIAGNOSIS — E119 Type 2 diabetes mellitus without complications: Secondary | ICD-10-CM | POA: Diagnosis not present

## 2021-10-22 LAB — BASIC METABOLIC PANEL
Anion gap: 9 (ref 5–15)
BUN: 10 mg/dL (ref 6–20)
CO2: 21 mmol/L — ABNORMAL LOW (ref 22–32)
Calcium: 9 mg/dL (ref 8.9–10.3)
Chloride: 106 mmol/L (ref 98–111)
Creatinine, Ser: 0.77 mg/dL (ref 0.44–1.00)
GFR, Estimated: >60 mL/min (ref >60)
Glucose, Bld: 128 mg/dL — ABNORMAL HIGH (ref 70–99)
Potassium: 3.9 mmol/L (ref 3.5–5.1)
Sodium: 136 mmol/L (ref 135–145)

## 2021-10-22 LAB — CBC WITH DIFFERENTIAL/PLATELET
Abs Immature Granulocytes: 0.05 10*3/uL (ref 0.00–0.07)
Basophils Absolute: 0 10*3/uL (ref 0.0–0.1)
Basophils Relative: 0 %
Eosinophils Absolute: 0 10*3/uL (ref 0.0–0.5)
Eosinophils Relative: 0 %
HCT: 43.6 % (ref 36.0–46.0)
Hemoglobin: 14.2 g/dL (ref 12.0–15.0)
Immature Granulocytes: 0 %
Lymphocytes Relative: 12 %
Lymphs Abs: 1.4 10*3/uL (ref 0.7–4.0)
MCH: 29.1 pg (ref 26.0–34.0)
MCHC: 32.6 g/dL (ref 30.0–36.0)
MCV: 89.3 fL (ref 80.0–100.0)
Monocytes Absolute: 1.2 10*3/uL — ABNORMAL HIGH (ref 0.1–1.0)
Monocytes Relative: 10 %
Neutro Abs: 9.1 10*3/uL — ABNORMAL HIGH (ref 1.7–7.7)
Neutrophils Relative %: 78 %
Platelets: 254 10*3/uL (ref 150–400)
RBC: 4.88 MIL/uL (ref 3.87–5.11)
RDW: 14.3 % (ref 11.5–15.5)
WBC: 11.8 10*3/uL — ABNORMAL HIGH (ref 4.0–10.5)
nRBC: 0 % (ref 0.0–0.2)

## 2021-10-22 LAB — TROPONIN I (HIGH SENSITIVITY): Troponin I (High Sensitivity): 11 ng/L (ref <18)

## 2021-10-22 MED ORDER — IOHEXOL 350 MG/ML SOLN
100.0000 mL | Freq: Once | INTRAVENOUS | Status: AC | PRN
  Administered 2021-10-22: 100 mL via INTRAVENOUS

## 2021-10-22 NOTE — ED Provider Notes (Signed)
[facility] EMERGENCY DEPARTMENT Provider Note   CSN: [id number] Arrival date & time: 10/22/21  0935     History Chief Complaint  Patient presents with   syncopal episode    Tanya Nolan is a 45 y.o. female with medical history significant for diabetes, hypercholesterolemia who presents for evaluation of syncope.  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  Subsequently had syncopal episode. She is unsure how long she was out.  Thinks she hit her head. No bowel or bladder incontinence, tongue biting, seizure-like activity.  Does states she has had increased anxiety, significant other passed away 2 years ago around this time.  States she has been trying to take care of her mother and has not been taking care of herself.  She does report she has had intermittent pleuritic chest pain over the last week and increased anxiety. Denies any SI, HI, AVH.  No exertional chest pain.  No diaphoresis, radiation of pain to left arm, left back or jaw.  Pain is diffuse to chest when it occurs. Described as chest tightness.  She has no current chest pain, palpitations.  No history of PE or DVT.  No recent surgery, mobilization, lower extremity edema, erythema or warmth.  No fever, blurred vision, numbness, weakness, shortness of breath, back pain, vision changes.  Denies additional aggravating or alleviating factors.  No current pain.  History obtained from patient and past medical records.  No interpreter used  HPI     Past Medical History:  Diagnosis Date   Abdominal pain 03/26/2009   Qualifier: Diagnosis of  By: Jerline Pain MD, [place], LUMBAR 03/26/2009   Qualifier: Diagnosis of  By: Jerline Pain MD, [place]     [place] 10/01/2007   Qualifier: Diagnosis of  By: Genene Churn MD, Jessica     Chronic back pain    tx with ibuprofen   Diabetes mellitus    Hypercholesteremia    Hyperlipidemia    no meds  - tx with diet   Hypertension    Hypothyroidism    Ingrown toenail 12/25/2014   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   C&B WNL  NEG PAPS after   MDD (major depressive disorder), recurrent severe, 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   Nipple discharge 10/13/2013   Non compliance w medication regimen 10/13/2013   Simple endometrial hyperplasia 09/2006   BENIGN SECRETORY 02/2007   Thyroid dysfunction     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   Nodule of skin of left foot 04/15/2021   Incontinence in female 10/05/2020   Major depressive disorder, single episode, severe with psychotic features ([place]) 08/13/2020   Grief 11/10/2019   Tobacco abuse 07/15/2019   Chronic back pain 09/19/2016   Hemorrhoids 02/02/2012   Hypothyroidism 01/10/2007   Type 2 diabetes mellitus with hyperlipidemia ([place]) 01/10/2007   POLYCYSTIC OVARY 01/10/2007   Hyperlipidemia 01/10/2007   HYPERTENSION, BENIGN SYSTEMIC 01/10/2007    Past Surgical History:  Procedure Laterality Date   [place] DECOMPRESSION  2008   HYSTEROSCOPY X 2  2007 / 2012   ENDOMETRIAL POLYPS REMOVED     OB History     Gravida  0   Para      Term      Preterm      AB      Living  SAB      IAB      Ectopic      Multiple      Live Births              Family History  Problem Relation Age of Onset   Lupus Mother    Hypertension Mother    Heart disease Father    Gout Father    Lupus Father    Hypertension Sister    Breast cancer Neg Hx     Social History   Tobacco Use   Smoking status: Every Day    Packs/day: 1.00    Years: 8.00    Pack years: 8.00    Types: Cigarettes   Smokeless tobacco: Never  Vaping Use   Vaping Use: Never used  Substance Use Topics   Alcohol use: Yes    Alcohol/week: 0.0 standard drinks    Comment: socially - wine/liquor--Rare   Drug use: Yes    Frequency: 7.0 times per week    Types: Marijuana    Home  Medications Prior to Admission medications   Medication Sig Start Date End Date Taking? Authorizing Provider  Accu-Chek FastClix Lancets MISC Use to check CBG TID 07/22/19   [name], MD  albuterol (PROAIR HFA) 108 (90 Base) MCG/ACT inhaler Inhale 1-2 puffs into the lungs every 6 (six) hours as needed for wheezing or shortness of breath. 05/20/21   [name], MD  ARIPiprazole (ABILIFY) 5 MG tablet Take 1 tablet (5 mg total) by mouth daily. For mood control 10/20/21   Eulis Canner E, NP  atorvastatin (LIPITOR) 80 MG tablet Take 1 tablet (80 mg total) by mouth daily. 05/18/21   [name], MD  BD ULTRA-FINE PEN NEEDLES 29G X 12.7MM MISC USE THREE TIMES A DAY BEFORE MEALS 06/10/13   Andrena Mews T, MD  Blood Glucose Monitoring Suppl (ACCU-CHEK GUIDE) w/Device KIT 1 applicator by Does not apply route 3 (three) times daily. Use to check CBG TID 07/22/19   [name], MD  cephALEXin (KEFLEX) 500 MG capsule Take 1 capsule (500 mg total) by mouth 4 (four) times daily. 06/09/21   Fredia Sorrow, MD  cetirizine (ZYRTEC) 10 MG tablet Take 1 tablet (10 mg total) by mouth daily. 04/13/21   Simmons-Robinson, Makiera, MD  empagliflozin (JARDIANCE) 10 MG TABS tablet Take 1 tablet (10 mg total) by mouth daily. 05/18/21   [name], MD  enalapril (VASOTEC) 2.5 MG tablet Take 1 tablet (2.5 mg total) by mouth daily. For high blood pressure 05/18/21   Andrena Mews T, MD  ezetimibe (ZETIA) 10 MG tablet Take 1 tablet (10 mg total) by mouth daily. 05/18/21   [name], MD  glucose blood (ACCU-CHEK GUIDE) test strip USE TO CHECK GBG 3 TIMES A DAY 04/13/21   Simmons-Robinson, Makiera, MD  hydrocortisone 2.5 % ointment Apply topically 2 (two) times daily. Limit use to one week 04/13/21   Simmons-Robinson, Riki Sheer, MD  levothyroxine (SYNTHROID) 125 MCG tablet Take 1 tablet (125 mcg total) by mouth daily before breakfast. Reported on 02/23/2016 05/18/21   [name], MD  polyethylene  glycol powder (GLYCOLAX/MIRALAX) 17 GM/SCOOP powder Take 17 g by mouth daily as needed. 06/20/19   [name], MD  sertraline (ZOLOFT) 100 MG tablet Take 1 tablet (100 mg total) by mouth daily. For depression 10/20/21   Salley Slaughter, NP  traZODone (DESYREL) 50 MG tablet Take 1-2 tablets (50-100 mg total) by mouth at bedtime  as needed for sleep. 10/20/21   Salley Slaughter, NP  insulin lispro protamine-insulin lispro (HUMALOG 75/25) (75-25) 100 UNIT/ML SUSP Inject 40-50 Units into the skin 2 (two) times daily with a meal. Patient takes 40 units in the morning and 50 units at night.  Verified it is Humalog 75/25.  04/18/12  [provider]    Allergies    Patient has no known allergies.  Review of Systems   Review of Systems  Constitutional: Negative.   HENT: Negative.    Respiratory: Negative.    Cardiovascular:  Positive for chest pain. Negative for palpitations and leg swelling.  Gastrointestinal: Negative.   Genitourinary: Negative.   Musculoskeletal: Negative.   Skin: Negative.   Neurological:  Positive for syncope. Negative for dizziness, tremors, seizures, facial asymmetry, speech difficulty, weakness, light-headedness, numbness and headaches.  All other systems reviewed and are negative.  Physical Exam Updated Vital Signs BP (!) 162/84   Pulse 81   Temp 98.8 F (37.1 C) (Oral)   Resp 16   Ht '5\' 7"'  (1.702 m)   Wt 98.9 kg   LMP 10/22/2021 (Approximate)   SpO2 100%   BMI 34.14 kg/m   Physical Exam Physical Exam  Constitutional: Pt is oriented to person, place, and time. Pt appears well-developed and well-nourished. No distress.  HENT:  Head: Normocephalic and atraumatic.  Mouth/Throat: Oropharynx is clear and moist.  Eyes: Conjunctivae and EOM are normal. Pupils are equal, round, and reactive to light. No scleral icterus.  No horizontal, vertical or rotational nystagmus  Neck: Normal range of motion. Neck supple.  Full active and passive ROM without  pain No midline or paraspinal tenderness No nuchal rigidity or meningeal signs  Cardiovascular: Normal rate, regular rhythm and intact distal pulses.   Pulmonary/Chest: Effort normal and breath sounds normal. No respiratory distress. Pt has no wheezes. No rales.  Abdominal: Soft. Bowel sounds are normal. There is no tenderness. There is no rebound and no guarding.  Musculoskeletal: Normal range of motion.  No bony tenderness.  Compartments soft.  Bevelyn Buckles' sign negative Lymphadenopathy:    No cervical adenopathy.  Neurological: Pt. is alert and oriented to person, place, and time. He has normal reflexes. No cranial nerve deficit.  Exhibits normal muscle tone. Coordination normal.  Mental Status:  Alert, oriented, thought content appropriate. Speech fluent without evidence of aphasia. Able to follow 2 step commands without difficulty.  Cranial Nerves:  II:  Peripheral visual fields grossly normal, pupils equal, round, reactive to light III,IV, VI: ptosis not present, extra-ocular motions intact bilaterally  V,VII: smile symmetric, facial light touch sensation equal VIII: hearing grossly normal bilaterally  IX,X: midline uvula rise  XI: bilateral shoulder shrug equal and strong XII: midline tongue extension  Motor:  5/5 in upper and lower extremities bilaterally including strong and equal grip strength and dorsiflexion/plantar flexion Sensory: Pinprick and light touch normal in all extremities.  Deep Tendon Reflexes: 2+ and symmetric  Cerebellar: normal finger-to-nose with bilateral upper extremities Gait: normal gait and balance CV: distal pulses palpable throughout   Skin: Skin is warm and dry. No rash noted. Pt is not diaphoretic.  Psychiatric: Pt has a normal mood and affect. Behavior is normal. Judgment and thought content normal.  Nursing note and vitals reviewed.  ED Results / Procedures / Treatments   Labs (all labs ordered are listed, but only abnormal results are  displayed) Labs Reviewed  CBC WITH DIFFERENTIAL/PLATELET - Abnormal; Notable for the following components:  Result Value   WBC 11.8 (*)    Neutro Abs 9.1 (*)    Monocytes Absolute 1.2 (*)    All other components within normal limits  BASIC METABOLIC PANEL - Abnormal; Notable for the following components:   CO2 21 (*)    Glucose, Bld 128 (*)    All other components within normal limits  TROPONIN I (HIGH SENSITIVITY)  TROPONIN I (HIGH SENSITIVITY)    EKG None  Radiology DG Chest 2 View  Result Date: 10/22/2021 CLINICAL DATA:  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 Pt reports SOB and mid CP, weakness and lightheadedness. Hx of HTN Diabetic, smoker sob EXAM: CHEST - 2 VIEW COMPARISON:  None. FINDINGS: Normal mediastinum and cardiac silhouette. Normal pulmonary vasculature. No evidence of effusion, infiltrate, or pneumothorax. No acute bony abnormality. IMPRESSION: No acute cardiopulmonary process. Electronically Signed   By: Suzy Bouchard M.D.   On: 10/22/2021 10:40   CT Head Wo Contrast  Result Date: 10/22/2021 CLINICAL DATA:  Syncope EXAM: CT HEAD WITHOUT CONTRAST TECHNIQUE: Contiguous axial images were obtained from the base of the skull through the vertex without intravenous contrast. COMPARISON:  CT head 06/09/2021 FINDINGS: Brain: No acute intracranial hemorrhage, mass effect, or herniation. No extra-axial fluid collections. No evidence of acute territorial infarct. No hydrocephalus. Vascular: No hyperdense vessel or unexpected calcification. Skull: Normal. Negative for fracture or focal lesion. Sinuses/Orbits: No acute finding. Other: None. IMPRESSION: No acute intracranial process identified. Electronically Signed   By: Ofilia Neas M.D.   On: 10/22/2021 10:40   CT Angio Chest PE W/Cm &/Or Wo Cm  Result Date: 10/22/2021 CLINICAL DATA:  Pulmonary embolism (PE) suspected, high prob pleuritic CP, syncope  EXAM: CT ANGIOGRAPHY CHEST WITH CONTRAST TECHNIQUE: Multidetector CT imaging of the chest was performed using the standard protocol during bolus administration of intravenous contrast. Multiplanar CT image reconstructions and MIPs were obtained to evaluate the vascular anatomy. CONTRAST:  19m OMNIPAQUE IOHEXOL 350 MG/ML SOLN COMPARISON:  None. FINDINGS: Cardiovascular: No filling defects in the pulmonary arteries to suggest pulmonary emboli. Heart is normal size. Aorta is normal caliber. Mediastinum/Nodes: No mediastinal, hilar, or axillary adenopathy. Trachea and esophagus are unremarkable. Thyroid unremarkable. Lungs/Pleura: Lungs are clear. No focal airspace opacities or suspicious nodules. No effusions. Upper Abdomen: Imaging into the upper abdomen demonstrates no acute findings. Musculoskeletal: Chest wall soft tissues are unremarkable. No acute bony abnormality. Review of the MIP images confirms the above findings. IMPRESSION: No acute or suspicious crash that no evidence of pulmonary embolus. No acute cardiopulmonary disease. Electronically Signed   By: KRolm BaptiseM.D.   On: 10/22/2021 15:00    Procedures Procedures   Medications Ordered in ED Medications  iohexol (OMNIPAQUE) 350 MG/ML injection 100 mL (100 mLs Intravenous Contrast Given 10/22/21 1452)   ED Course  I have reviewed the triage vital signs and the nursing notes.  Pertinent labs & imaging results that were available during my care of the patient were reviewed by me and considered in my medical decision making (see chart for details).  45year old here for evaluation of multiple complaints.  Afebrile, nonseptic, not ill-appearing. 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  No seizure-like activity.  Did hit her head however has nonfocal neuro exam without deficits.  Does mention she has had intermittent chest tightness over the last week.  Has had increasing anxiety as  well due to anniversary  of significant other's passing.  Denies any SI, HI, AVH.Marland Kitchen  No clinical evidence of VTE on exam.  Pain is nonexertional.  No radiation to left arm, left back or jaw.  No diaphoresis.  Work-up started from triage which I personally reviewed and interpreted:  EKG without ischemic changes CT head without significant abnormality Chest x-ray without significant abnormality CTA without any evidence of PE, infectious process, edema Trop 11, given pain x 1 week low suspicion for ACS CBC leukocytosis at 85.4 Metabolic panel without significant abnormality  Does not want preg test, states has menstrual cycle and she is not pregnant.  Patient reassessed.  Continues a nonfocal neuro exam without deficits.  She is ambulatory without difficulty.  Unclear etiology of symptoms.  She appears overall well.  Discussed to rest, hydrate at home with PCP follow-up.  She is agreeable  Patient without arrhythmia or tachycardia while here in the department.  Patient without history of congestive heart failure, normal hematocrit, normal ECG, no shortness of breath and systolic blood pressure greater than 90; patient is low risk.   Chest pain is not likely of cardiac or pulmonary etiology d/t presentation, VSS, no tracheal deviation, no JVD or new murmur, RRR, breath sounds equal bilaterally, EKG without acute abnormalities, negative troponin, and negative CXR. Pt has been advised to return to the ED if CP becomes exertional, associated with diaphoresis or nausea, radiates to left jaw/arm, worsens or becomes concerning in any way. Pt appears reliable for follow up and is agreeable to discharge.    The patient has been appropriately medically screened and/or stabilized in the ED. I have low suspicion for any other emergent medical condition which would require further screening, evaluation or treatment in the ED or require inpatient management.  Patient is hemodynamically stable and in no acute  distress.  Patient able to ambulate in department prior to ED.  Evaluation does not show acute pathology that would require ongoing or additional emergent interventions while in the emergency department or further inpatient treatment.  I have discussed the diagnosis with the patient and answered all questions.  Pain is been managed while in the emergency department and patient has no further complaints prior to discharge.  Patient is comfortable with plan discussed in room and is stable for discharge at this time.  I have discussed strict return precautions for returning to the emergency department.  Patient was encouraged to follow-up with PCP/specialist refer to at discharge.     MDM Rules/Calculators/A&P                            Final Clinical Impression(s) / ED Diagnoses Final diagnoses:  Syncope, unspecified syncope type  Chest pain, unspecified type  Anxiety    Rx / DC Orders ED Discharge Orders     None        Kadarious Dikes A, PA-C 10/22/21 [place], DO 10/23/21 [phone]

## 2021-10-22 NOTE — ED Notes (Signed)
Patient transported to CT 

## 2021-10-22 NOTE — Discharge Instructions (Addendum)
Your work-up here in the emergency department was reassuring.  Make sure to rest and drink plenty of fluids.  Follow-up with primary care provider early next week  Return for new or worsening symptoms

## 2021-10-22 NOTE — ED Triage Notes (Addendum)
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  EMS reports pt has been working and taking care of her mother and not caring for herself.  Pt's husband passed away 2 years ago around this time.    Pt reports chest pain x 1 week.

## 2021-10-22 NOTE — ED Notes (Signed)
Pt verbalized understanding of d/c instructions, meds, and followup care. Denies questions. VSS, no distress noted. Steady gait to exit with all belongings.  ?

## 2021-10-22 NOTE — ED Provider Notes (Signed)
Emergency Medicine Provider Triage Evaluation Note  Tanya Nolan , a 45 y.o. female  was evaluated in triage.  Pt complains of syncopal episode.  Patient states that she has had 1 week of ongoing chest tightness that she describes as intermittent.  Associated with shortness of breath and pain worse with deep breaths.  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  She states that she then had syncopal episode.  She endorses ongoing chest pain now.  She denies abdominal pain current nausea or vomiting.  Denies cough.  Review of Systems  Positive: See above Negative:   Physical Exam  BP 140/89 (BP Location: Right Arm)   Pulse 97   Temp 98.2 F (36.8 C) (Oral)   Resp 16   SpO2 99%  Gen:   Awake, crying on exam Resp:  Normal effort, lungs clear to auscultation MSK:   Moves extremities without difficulty  Other:  S1/S2 without murmur.  Pulses 2+ bilaterally.  Medical Decision Making  Medically screening exam initiated at 10:02 AM.  Appropriate orders placed.  DARBY FLEEMAN was informed that the remainder of the evaluation will be completed by another provider, this initial triage assessment does not replace that evaluation, and the importance of remaining in the ED until their evaluation is complete.     Mickie Hillier, PA-C 10/22/21 [place], [place], DO 10/22/21 1451

## 2021-11-24 ENCOUNTER — Emergency Department (HOSPITAL_COMMUNITY): Payer: Medicaid Other

## 2021-11-24 ENCOUNTER — Other Ambulatory Visit: Payer: Self-pay

## 2021-11-24 ENCOUNTER — Emergency Department (HOSPITAL_COMMUNITY)
Admission: EM | Admit: 2021-11-24 | Discharge: 2021-11-25 | Disposition: A | Discharge status: Home / Self Care | Payer: Medicaid Other | Attending: Emergency Medicine | Admitting: Emergency Medicine

## 2021-11-24 DIAGNOSIS — M25571 Pain in right ankle and joints of right foot: Secondary | ICD-10-CM | POA: Insufficient documentation

## 2021-11-24 NOTE — ED Triage Notes (Signed)
Pt reported to ED with c/o pain and swelling to right ankle after mechanical fall when stepping down of porch today. States she landed on something and twisted her ankle. Obvious swelling noted to area with limited movement.

## 2021-11-24 NOTE — ED Provider Triage Note (Signed)
Emergency Medicine Provider Triage Evaluation Note  Tanya Nolan , a 46 y.o. female  was evaluated in triage.  Pt complains of right ankle pain after she tripped and landed on her ankle incorrectly.  She is swelling to the lateral side of her right ankle.  Decreased range of motion.  Denies numbness.  Not weightbearing.  Review of Systems  Positive:  Negative:   Physical Exam  There were no vitals taken for this visit. Gen:   Awake, no distress   Resp:  Normal effort  MSK:   Moves extremities without difficulty  Other:  Right ankle swelling. Lateral malleolus ttp. Pedal pulse 2+. sensation intact.  Medical Decision Making  Medically screening exam initiated at 9:34 PM.  Appropriate orders placed.  Tanya Nolan was informed that the remainder of the evaluation will be completed by another provider, this initial triage assessment does not replace that evaluation, and the importance of remaining in the ED until their evaluation is complete.  XR placed   Adolphus Birchwood, PA-C 11/24/21 2135

## 2021-11-25 MED ORDER — HYDROCODONE-ACETAMINOPHEN 5-325 MG PO TABS
1.0000 | ORAL_TABLET | Freq: Once | ORAL | Status: AC
  Administered 2021-11-25: 1 via ORAL
  Filled 2021-11-25: qty 1

## 2021-11-25 MED ORDER — NAPROXEN 500 MG PO TABS: 500.0000 mg | ORAL_TABLET | Freq: Two times a day (BID) | ORAL | 0 refills | Status: DC

## 2021-11-25 NOTE — Discharge Instructions (Signed)
Take naproxen 2 times a day with meals.  Do not take other anti-inflammatories at the same time open (Advil, Motrin, ibuprofen, Aleve). You may supplement with Tylenol if you need further pain control. Use ice packs, 3 times a day for 20 minutes, to help with pain and swelling. Keep your foot elevated to help with pain and swelling. Use the cam walker and crutches as needed for pain control. Follow-up with orthopedic doctor listed below as needed for further evaluation. Return to the emergency room with any new, worsening, or concerning symptoms

## 2021-11-25 NOTE — ED Provider Notes (Signed)
[facility] EMERGENCY DEPARTMENT Provider Note   CSN: [id number] Arrival date & time: 11/24/21  2105     History  Chief Complaint  Patient presents with   Ankle Pain    Tanya Nolan is a 46 y.o. female presenting for evaluation of right ankle pain.  Patient states she was stepping down off her porch when she missed stepped and sharply inverted her right ankle/foot.  Since then, she has had pain and swelling.  She has not taken anything for it.  She denies injury elsewhere, did not fall and hit her head.  HPI     Home Medications Prior to Admission medications   Medication Sig Start Date End Date Taking? Authorizing Provider  naproxen (NAPROSYN) 500 MG tablet Take 1 tablet (500 mg total) by mouth 2 (two) times daily with a meal. 11/25/21  Yes Raiden Haydu, PA-C  Accu-Chek FastClix Lancets MISC Use to check CBG TID 07/22/19   [name], MD  albuterol (PROAIR HFA) 108 (90 Base) MCG/ACT inhaler Inhale 1-2 puffs into the lungs every 6 (six) hours as needed for wheezing or shortness of breath. 05/20/21   [name], MD  ARIPiprazole (ABILIFY) 5 MG tablet Take 1 tablet (5 mg total) by mouth daily. For mood control 10/20/21   Eulis Canner E, NP  atorvastatin (LIPITOR) 80 MG tablet Take 1 tablet (80 mg total) by mouth daily. 05/18/21   [name], MD  BD ULTRA-FINE PEN NEEDLES 29G X 12.7MM MISC USE THREE TIMES A DAY BEFORE MEALS 06/10/13   Andrena Mews T, MD  Blood Glucose Monitoring Suppl (ACCU-CHEK GUIDE) w/Device KIT 1 applicator by Does not apply route 3 (three) times daily. Use to check CBG TID 07/22/19   [name], MD  cephALEXin (KEFLEX) 500 MG capsule Take 1 capsule (500 mg total) by mouth 4 (four) times daily. 06/09/21   Fredia Sorrow, MD  cetirizine (ZYRTEC) 10 MG tablet Take 1 tablet (10 mg total) by mouth daily. 04/13/21   Simmons-Robinson, Makiera, MD  empagliflozin (JARDIANCE) 10 MG TABS tablet Take 1 tablet (10 mg total) by  mouth daily. 05/18/21   [name], MD  enalapril (VASOTEC) 2.5 MG tablet Take 1 tablet (2.5 mg total) by mouth daily. For high blood pressure 05/18/21   Andrena Mews T, MD  ezetimibe (ZETIA) 10 MG tablet Take 1 tablet (10 mg total) by mouth daily. 05/18/21   [name], MD  glucose blood (ACCU-CHEK GUIDE) test strip USE TO CHECK GBG 3 TIMES A DAY 04/13/21   Simmons-Robinson, Makiera, MD  hydrocortisone 2.5 % ointment Apply topically 2 (two) times daily. Limit use to one week 04/13/21   Simmons-Robinson, Riki Sheer, MD  levothyroxine (SYNTHROID) 125 MCG tablet Take 1 tablet (125 mcg total) by mouth daily before breakfast. Reported on 02/23/2016 05/18/21   [name], MD  polyethylene glycol powder (GLYCOLAX/MIRALAX) 17 GM/SCOOP powder Take 17 g by mouth daily as needed. 06/20/19   [name], MD  sertraline (ZOLOFT) 100 MG tablet Take 1 tablet (100 mg total) by mouth daily. For depression 10/20/21   Salley Slaughter, NP  traZODone (DESYREL) 50 MG tablet Take 1-2 tablets (50-100 mg total) by mouth at bedtime as needed for sleep. 10/20/21   Salley Slaughter, NP  insulin lispro protamine-insulin lispro (HUMALOG 75/25) (75-25) 100 UNIT/ML SUSP Inject 40-50 Units into the skin 2 (two) times daily with a meal. Patient takes 40 units in the morning and 50 units  at night.  Verified it is Humalog 75/25.  04/18/12  [provider]      Allergies    Patient has no known allergies.    Review of Systems   Review of Systems  Musculoskeletal:  Positive for joint swelling.  Hematological:  Does not bruise/bleed easily.   Physical Exam Updated Vital Signs BP (!) 175/99    Pulse 89    Temp 98.9 F (37.2 C)    Resp 17    Ht '5\' 7"'  (1.702 m)    Wt 98.9 kg    SpO2 98%    BMI 34.15 kg/m  Physical Exam Vitals and nursing note reviewed.  Constitutional:      General: She is not in acute distress.    Appearance: She is well-developed.  HENT:     Head: Normocephalic and atraumatic.   Eyes:     Extraocular Movements: Extraocular movements intact.  Cardiovascular:     Rate and Rhythm: Normal rate.  Pulmonary:     Effort: Pulmonary effort is normal.  Abdominal:     General: There is no distension.  Musculoskeletal:        General: Swelling and tenderness present.     Cervical back: Normal range of motion.     Comments: Swelling of the right foot, especially around the lateral malleolus and dorsal lateral foot.  Pedal pulse 2+.  Good distal sensation and cap refill.  Patient unable to range ankle due to pain.  Achilles tendon is palpable and intact.  No tenderness palpation over the medial malleolus or calcaneus.  Skin:    General: Skin is warm.     Findings: No rash.  Neurological:     Mental Status: She is alert and oriented to person, place, and time.    ED Results / Procedures / Treatments   Labs (all labs ordered are listed, but only abnormal results are displayed) Labs Reviewed - No data to display  EKG None  Radiology DG Ankle Complete Right  Result Date: 11/24/2021 CLINICAL DATA:  Fall EXAM: RIGHT ANKLE - COMPLETE 3+ VIEW COMPARISON:  None. FINDINGS: Lateral soft tissue swelling. Posterior and plantar calcaneal spurs. No acute bony abnormality. Specifically, no fracture, subluxation, or dislocation. IMPRESSION: No acute bony abnormality. Electronically Signed   By: Rolm Baptise M.D.   On: 11/24/2021 22:10    Procedures Procedures    Medications Ordered in ED Medications  HYDROcodone-acetaminophen (NORCO/VICODIN) 5-325 MG per tablet 1 tablet (has no administration in time range)    ED Course/ Medical Decision Making/ A&P                           Medical Decision Making   This patient presents to the ED for concern of R foot pain. This involves a number of treatment options, and is a complaint that carries with it a low risk of complications and morbidity.  The differential diagnosis includes fracture, sprain   Imaging Studies: X-ray  obtained from triage viewed and independently interpreted by me, shows no fracture or dislocation.  There is a significant amount of soft tissue swelling.  Dispostion:  After consideration of the diagnostic results and the patients response to treatment, I feel that the patent would benefit from OP management and ortho follow up.  Discussed RICE therapy.  Will give patient crutches and Cam walker to help her get around.  Information given for orthopedic follow-up.  At this time, patient appears safe for discharge.  Return precautions given.  Patient states she understands and agrees to plan  Final Clinical Impression(s) / ED Diagnoses Final diagnoses:  Acute right ankle pain    Rx / DC Orders ED Discharge Orders          Ordered    naproxen (NAPROSYN) 500 MG tablet  2 times daily with meals        11/25/21 0155              Zanaya Baize, PA-C 11/25/21 0232    Ripley Fraise, MD 11/26/21 0400

## 2021-11-25 NOTE — Progress Notes (Signed)
Orthopedic Tech Progress Note Patient Details:  VAUNDA GUTTERMAN 10-Jun-1976 [id number]  Ortho Devices Type of Ortho Device: Crutches, CAM walker Ortho Device/Splint Location: RLE Ortho Device/Splint Interventions: Ordered, Application, Adjustment   Post Interventions Patient Tolerated: Well Instructions Provided: Adjustment of device, Care of device, Poper ambulation with device  Jeylin Woodmansee 11/25/2021, 6:56 AM

## 2021-12-09 DIAGNOSIS — S8261XA Displaced fracture of lateral malleolus of right fibula, initial encounter for closed fracture: Secondary | ICD-10-CM | POA: Insufficient documentation

## 2021-12-12 ENCOUNTER — Other Ambulatory Visit: Payer: Self-pay | Admitting: Orthopaedic Surgery

## 2021-12-12 DIAGNOSIS — M25571 Pain in right ankle and joints of right foot: Secondary | ICD-10-CM

## 2021-12-14 ENCOUNTER — Ambulatory Visit
Admission: RE | Admit: 2021-12-14 | Discharge: 2021-12-14 | Disposition: A | Discharge status: Home / Self Care | Payer: Medicaid Other | Source: Ambulatory Visit | Attending: Orthopaedic Surgery | Admitting: Orthopaedic Surgery

## 2021-12-14 ENCOUNTER — Other Ambulatory Visit: Payer: Self-pay

## 2021-12-14 DIAGNOSIS — M25571 Pain in right ankle and joints of right foot: Secondary | ICD-10-CM

## 2022-01-09 ENCOUNTER — Telehealth (INDEPENDENT_AMBULATORY_CARE_PROVIDER_SITE_OTHER): Payer: Medicaid Other | Admitting: Psychiatry

## 2022-01-09 ENCOUNTER — Encounter (HOSPITAL_COMMUNITY): Payer: Self-pay | Admitting: Psychiatry

## 2022-01-09 DIAGNOSIS — F32A Depression, unspecified: Secondary | ICD-10-CM

## 2022-01-09 MED ORDER — SERTRALINE HCL 100 MG PO TABS: 100.0000 mg | ORAL_TABLET | Freq: Every day | ORAL | 3 refills | Status: DC

## 2022-01-09 MED ORDER — TRAZODONE HCL 50 MG PO TABS: 50.0000 mg | ORAL_TABLET | Freq: Every evening | ORAL | 3 refills | Status: DC | PRN

## 2022-01-09 MED ORDER — ARIPIPRAZOLE 5 MG PO TABS: 5.0000 mg | ORAL_TABLET | Freq: Every day | ORAL | 3 refills | Status: DC

## 2022-01-09 NOTE — Progress Notes (Signed)
BH MD/PA/NP OP Progress Note Virtual Visit via Video Note  I connected with Tanya Nolan on 01/09/22 at  2:30 PM EST by a video enabled telemedicine application and verified that I am speaking with the correct person using two identifiers.  Location: Patient: Home Provider: Clinic   I discussed the limitations of evaluation and management by telemedicine and the availability of in person appointments. The patient expressed understanding and agreed to proceed.  I provided 30 minutes of non-face-to-face time during this encounter.       01/09/2022 2:50 PM Tanya Nolan  MRN:  [id number]  Chief Complaint: "I'm not good. My husbands aunt and uncle passed"  HPI: 46 year old female seen today for follow up psychiatric evaluation.  She has a history of major depression.  She is currently being managed on Zoloft 100 mg daily, trazodone 50 mg nightly as needed, and Abilify 5 mg daily.  She notes that she has not taken her medications in a while and reports that she is not feeling mentally stable.     Today she was well-groomed, pleasant, cooperative, and engaged in conversation.  She informed Probation officer that since her last visit she has not been doing good.  She notes that she forgets to take her medication and has been more anxious and depressed.  She also notes that the death of her deceased husbands uncle and aunt recently passed away and notes that this is taking a toll on her mental health as well.  Provider conducted a GAD-7 and patient scored an 18.  Provider also conducted PHQ-9 and patient scored a 14.  She notes that her sleep fluctuates reporting that she sleeps 4 to 5 hours nightly.  Recently she notes that her appetite has somewhat picked up and notes that she tries to eats one full meal a day.  Patient endorses passive SI however notes that she does not want to harm herself.  Today she denies SI/HI/VAH, mania, or paranoia.   Patient informed Probation officer that she broke her right foot in  January.  She notes that she is on naproxen and informed writer that her pain is managed well.  Provider discussed potential side effects with naproxen and Zoloft.  She endorsed understanding and notes that she would notify her primary care doctor or writer if side effects occur  At this time patient request that medications not be readjusted as she has not taken them in a while.  She will restart Zoloft 100 mg, trazodone 50 to 100 mg nightly as needed, and Abilify 5 mg.  Patient rescheduled for therapy appointment to help manage her current stressors.  She will see a therapist on 01/10/2022 at 3:00.  No other concerns at this time.       Visit Diagnosis:    ICD-10-CM   1. Mild depression  F32.A sertraline (ZOLOFT) 100 MG tablet    traZODone (DESYREL) 50 MG tablet    ARIPiprazole (ABILIFY) 5 MG tablet      Past Psychiatric History:  depression   Past Medical History:  Past Medical History:  Diagnosis Date   Abdominal pain 03/26/2009   Qualifier: Diagnosis of  By: Jerline Pain MD, [place], LUMBAR 03/26/2009   Qualifier: Diagnosis of  By: Jerline Pain MD, [place]     [place] 10/01/2007   Qualifier: Diagnosis of  By: Genene Churn MD, [place]     Chronic back pain    tx with ibuprofen   Diabetes mellitus  Hypercholesteremia    Hyperlipidemia    no meds - tx with diet   Hypertension    Hypothyroidism    Ingrown toenail 12/25/2014   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   C&B WNL  NEG PAPS after   MDD (major depressive disorder), recurrent severe, 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   Nipple discharge 10/13/2013   Non compliance w medication regimen 10/13/2013   Simple endometrial hyperplasia 09/2006   BENIGN SECRETORY 02/2007   Thyroid dysfunction     Past Surgical History:  Procedure Laterality Date   [place] DECOMPRESSION  2008   HYSTEROSCOPY X 2  2007 / 2012   ENDOMETRIAL POLYPS REMOVED    Family Psychiatric History: Mother depression   Family History:  Family History  Problem Relation Age of Onset   Lupus Mother    Hypertension Mother    Heart disease Father    Gout Father    Lupus Father    Hypertension Sister    Breast cancer Neg Hx     Social History:  Social History   Socioeconomic History   Marital status: Widowed    Spouse name: Not on file   Number of children: Not on file   Years of education: Not on file   Highest education level: Not on file  Occupational History   Not on file  Tobacco Use   Smoking status: Every Day    Packs/day: 1.00    Years: 8.00    Pack years: 8.00    Types: Cigarettes   Smokeless tobacco: Never  Vaping Use   Vaping Use: Never used  Substance and Sexual Activity   Alcohol use: Yes    Alcohol/week: 0.0 standard drinks    Comment: socially - wine/liquor--Rare   Drug use: Yes    Frequency: 7.0 times per week    Types: Marijuana   Sexual activity: Yes    Birth control/protection: None    Comment: 1st intercourse 46 yo-More than 5 partners  Other Topics Concern   Not on file  Social History Narrative   Not on file   Social Determinants of Health   Financial Resource Strain: Not on file  Food Insecurity: Not on file  Transportation Needs: Not on file  Physical Activity: Not on file  Stress: Not on file  Social Connections: Not on file    Allergies: No Known Allergies  Metabolic Disorder Labs: Lab Results  Component Value Date   HGBA1C 6.9 05/17/2021   MPG 171.42 03/16/2020   MPG 171.42 03/16/2020   No results found for: PROLACTIN Lab Results  Component Value Date   CHOL 210 (H) 05/17/2021   TRIG 172 (H) 05/17/2021   HDL 25 (L) 05/17/2021   CHOLHDL 8.4 (H) 05/17/2021   VLDL 21 03/16/2020   LDLCALC 153 (H) 05/17/2021   LDLCALC 162 (H) 03/16/2020   Lab Results  Component Value Date   TSH 2.530 05/17/2021   TSH 1.655 03/14/2020    Therapeutic Level Labs: No results found for: LITHIUM No results found for: VALPROATE No components found for:   CBMZ  Current Medications: Current Outpatient Medications  Medication Sig Dispense Refill   Accu-Chek FastClix Lancets MISC Use to check CBG TID 100 each 12   albuterol (PROAIR HFA) 108 (90 Base) MCG/ACT inhaler Inhale 1-2 puffs into the lungs every 6 (six) hours as needed for wheezing or shortness of breath. 18 g 1   ARIPiprazole (ABILIFY) 5 MG tablet Take 1 tablet (5 mg total) by mouth daily. For  mood control 30 tablet 3   atorvastatin (LIPITOR) 80 MG tablet Take 1 tablet (80 mg total) by mouth daily. 90 tablet 1   BD ULTRA-FINE PEN NEEDLES 29G X 12.7MM MISC USE THREE TIMES A DAY BEFORE MEALS 1 each 2   Blood Glucose Monitoring Suppl (ACCU-CHEK GUIDE) w/Device KIT 1 applicator by Does not apply route 3 (three) times daily. Use to check CBG TID 1 kit 0   cephALEXin (KEFLEX) 500 MG capsule Take 1 capsule (500 mg total) by mouth 4 (four) times daily. 28 capsule 0   cetirizine (ZYRTEC) 10 MG tablet Take 1 tablet (10 mg total) by mouth daily. 30 tablet 11   empagliflozin (JARDIANCE) 10 MG TABS tablet Take 1 tablet (10 mg total) by mouth daily. 90 tablet 3   enalapril (VASOTEC) 2.5 MG tablet Take 1 tablet (2.5 mg total) by mouth daily. For high blood pressure 90 tablet 1   ezetimibe (ZETIA) 10 MG tablet Take 1 tablet (10 mg total) by mouth daily. 90 tablet 1   glucose blood (ACCU-CHEK GUIDE) test strip USE TO CHECK GBG 3 TIMES A DAY 100 strip 1   hydrocortisone 2.5 % ointment Apply topically 2 (two) times daily. Limit use to one week 30 g 0   levothyroxine (SYNTHROID) 125 MCG tablet Take 1 tablet (125 mcg total) by mouth daily before breakfast. Reported on 02/23/2016 90 tablet 1   naproxen (NAPROSYN) 500 MG tablet Take 1 tablet (500 mg total) by mouth 2 (two) times daily with a meal. 20 tablet 0   polyethylene glycol powder (GLYCOLAX/MIRALAX) 17 GM/SCOOP powder Take 17 g by mouth daily as needed. 3350 g 1   sertraline (ZOLOFT) 100 MG tablet Take 1 tablet (100 mg total) by mouth daily. For depression  30 tablet 3   traZODone (DESYREL) 50 MG tablet Take 1-2 tablets (50-100 mg total) by mouth at bedtime as needed for sleep. 60 tablet 3   No current facility-administered medications for this visit.     Musculoskeletal: Strength & Muscle Tone:  Unable to assess due to telehealth visit [place]:  Unable to assess due to telehealthe visit Patient leans: N/A  Psychiatric Specialty Exam: Review of Systems  There were no vitals taken for this visit.There is no height or weight on file to calculate BMI.  General Appearance: Well groomed  Eye Contact:  Good  Speech:  Clear and Coherent and Normal Rate  Volume:  Normal  Mood:  Euthymic  Affect:  Appropriate, Congruent, and Tearful  Thought Process:  Coherent, Goal Directed and Linear  Orientation:  Full (Time, Place, and Person)  Thought Content: WDL and Logical   Suicidal Thoughts:  Yes.  without intent/plan  Homicidal Thoughts:  No  Memory:  Immediate;   Good Recent;   Good Remote;   Good  Judgement:  Good  Insight:  Good  Psychomotor Activity:  Normal  Concentration:  Concentration: Good and Attention Span: Good  Recall:  Good  Fund of Knowledge: Good  Language: Good  Akathisia:  No  Handed:  Right  AIMS (if indicated): Not done  Assets:  Communication Skills Desire for Improvement Financial Resources/Insurance Housing Social Support  ADL's:  Intact  Cognition: WNL  Sleep:  Fair   Screenings: AIMS    Flowsheet Row Admission (Discharged) from 03/15/2020 in [place] [facility]  AIMS Total Score 0      AUDIT    Flowsheet Row Admission (Discharged) from 03/15/2020 in [place]  [facility]  Alcohol Use Disorder Identification Test Final Score (AUDIT) 1      GAD-7    Flowsheet Row Video Visit from 01/09/2022 in [facility] Video Visit from 07/14/2021 in [facility] Video Visit from 02/14/2021 in [facility] Video Visit from 11/15/2020 in [facility]  Total GAD-7 Score _0 [id number]    Flowsheet Row Video Visit from 01/09/2022 in [facility] Visit from 07/14/2021 in [facility] Office Visit from 05/17/2021 in [place] Video Visit from 02/14/2021 in [facility] Office Visit from 01/05/2021 in [place]  PHQ-2 Total Score _1 PHQ-9 Total Score _2 Flowsheet Row Video Visit from 01/09/2022 in [facility] ED from 10/22/2021 in [place] ED from 06/09/2021 in [facility]  C-SSRS RISK CATEGORY Error: Q7 should not be populated when Q6 is No No Risk No Risk        Assessment and Plan: Patient endorses symptoms of anxiety, depression, and insomnia since discontinuing her medications.  She also notes that she is having a difficult time with the loss of her deceased husband's aunt/uncle. At this time patient request that medications not be readjusted as she has not taken them in a while.  She will restart Zoloft 100 mg, trazodone 50 to 100 mg nightly as needed, and Abilify 5 mg.  Patient rescheduled for therapy appointment to help manage her current stressors.  She will see a therapist on 01/10/2022 at 3:00. 1. Mild depression  Continue/restart- sertraline (ZOLOFT) 100 MG tablet; Take 1 tablet (100 mg total) by mouth daily. For depression  Dispense: 30 tablet; Refill: 3 Continue/restart- traZODone (DESYREL) 50 MG tablet; Take 1-2 tablets (50-100 mg total) by mouth at bedtime as needed for sleep.  Dispense: 60 tablet; Refill: 3 Continue/restart- ARIPiprazole (ABILIFY) 5 MG tablet; Take 1 tablet (5 mg total) by mouth daily. For mood control  Dispense: 30 tablet; Refill: 3   Follow-up in 3  months Follow-up with therapy  Salley Slaughter, NP 01/09/2022, 2:50 PM

## 2022-01-10 ENCOUNTER — Ambulatory Visit (HOSPITAL_COMMUNITY): Payer: Medicaid Other | Admitting: Clinical

## 2022-01-18 ENCOUNTER — Other Ambulatory Visit: Payer: Self-pay

## 2022-01-18 ENCOUNTER — Ambulatory Visit (HOSPITAL_COMMUNITY)
Admission: EM | Admit: 2022-01-18 | Discharge: 2022-01-18 | Disposition: A | Discharge status: Home / Self Care | Payer: Medicaid Other | Attending: Psychiatry | Admitting: Psychiatry

## 2022-01-18 ENCOUNTER — Other Ambulatory Visit (HOSPITAL_COMMUNITY)
Admission: RE | Admit: 2022-01-18 | Discharge: 2022-01-18 | Disposition: A | Discharge status: Home / Self Care | Payer: Medicaid Other | Source: Ambulatory Visit | Attending: Family Medicine | Admitting: Family Medicine

## 2022-01-18 ENCOUNTER — Ambulatory Visit (INDEPENDENT_AMBULATORY_CARE_PROVIDER_SITE_OTHER): Payer: Medicaid Other | Admitting: Family Medicine

## 2022-01-18 ENCOUNTER — Encounter: Payer: Self-pay | Admitting: Family Medicine

## 2022-01-18 VITALS — BP 165/91 | HR 85 | Ht 67.0 in | Wt 215.4 lb

## 2022-01-18 DIAGNOSIS — Z113 Encounter for screening for infections with a predominantly sexual mode of transmission: Secondary | ICD-10-CM

## 2022-01-18 DIAGNOSIS — R45851 Suicidal ideations: Secondary | ICD-10-CM | POA: Insufficient documentation

## 2022-01-18 DIAGNOSIS — Z634 Disappearance and death of family member: Secondary | ICD-10-CM | POA: Insufficient documentation

## 2022-01-18 DIAGNOSIS — F339 Major depressive disorder, recurrent, unspecified: Secondary | ICD-10-CM

## 2022-01-18 DIAGNOSIS — Z114 Encounter for screening for human immunodeficiency virus [HIV]: Secondary | ICD-10-CM

## 2022-01-18 DIAGNOSIS — E785 Hyperlipidemia, unspecified: Secondary | ICD-10-CM

## 2022-01-18 DIAGNOSIS — Z9151 Personal history of suicidal behavior: Secondary | ICD-10-CM | POA: Insufficient documentation

## 2022-01-18 DIAGNOSIS — F323 Major depressive disorder, single episode, severe with psychotic features: Secondary | ICD-10-CM | POA: Diagnosis not present

## 2022-01-18 DIAGNOSIS — I10 Essential (primary) hypertension: Secondary | ICD-10-CM

## 2022-01-18 DIAGNOSIS — E039 Hypothyroidism, unspecified: Secondary | ICD-10-CM

## 2022-01-18 DIAGNOSIS — E1169 Type 2 diabetes mellitus with other specified complication: Secondary | ICD-10-CM | POA: Diagnosis not present

## 2022-01-18 DIAGNOSIS — F332 Major depressive disorder, recurrent severe without psychotic features: Secondary | ICD-10-CM | POA: Diagnosis not present

## 2022-01-18 LAB — POCT GLYCOSYLATED HEMOGLOBIN (HGB A1C): HbA1c, POC (controlled diabetic range): 6.6 % (ref 0.0–7.0)

## 2022-01-18 MED ORDER — LEVOTHYROXINE SODIUM 125 MCG PO TABS: 125.0000 ug | ORAL_TABLET | Freq: Every day | ORAL | 1 refills | Status: DC

## 2022-01-18 MED ORDER — ENALAPRIL MALEATE 2.5 MG PO TABS: 2.5000 mg | ORAL_TABLET | Freq: Every day | ORAL | 1 refills | Status: DC

## 2022-01-18 MED ORDER — EZETIMIBE 10 MG PO TABS
10.0000 mg | ORAL_TABLET | Freq: Every day | ORAL | 1 refills | Status: DC
Start: 2022-01-18 — End: 2022-03-21

## 2022-01-18 MED ORDER — ATORVASTATIN CALCIUM 80 MG PO TABS: 80.0000 mg | ORAL_TABLET | Freq: Every day | ORAL | 1 refills | Status: DC

## 2022-01-18 NOTE — Patient Instructions (Signed)
Major Depressive Disorder, Adult ?Major depressive disorder is a mental health condition. This disorder affects feelings. It can also affect the body. Symptoms of this condition last most of the day, almost every day, for 2 weeks. This disorder can affect: ?Relationships. ?Daily activities, such as work and school. ?Activities that you normally like to do. ?What are the causes? ?The cause of this condition is not known. The disorder is likely caused by a mix of things, including: ?Your personality, such as being a shy person. ?Your behavior, or how you act toward others. ?Your thoughts and feelings. ?Too much alcohol or drugs. ?How you react to stress. ?Health and mental problems that you have had for a long time. ?Things that hurt you in the past (trauma). ?Big changes in your life, such as divorce. ?What increases the risk? ?The following factors may make you more likely to develop this condition: ?Having family members with depression. ?Being a woman. ?Problems in the family. ?Low levels of some brain chemicals. ?Things that caused you pain as a child, especially if you lost a parent or were abused. ?A lot of stress in your life, such as from: ?Living without basic needs of life, such as food and shelter. ?Being treated poorly because of race, sex, or religion (discrimination). ?Health and mental problems that you have had for a long time. ?What are the signs or symptoms? ?The main symptoms of this condition are: ?Being sad all the time. ?Being grouchy all the time. ?Loss of interest in things and activities. ?Other symptoms include: ?Sleeping too much or too little. ?Eating too much or too little. ?Gaining or losing weight, without knowing why. ?Feeling tired or having low energy. ?Being restless and weak. ?Feeling hopeless, worthless, or guilty. ?Trouble thinking clearly or making decisions. ?Thoughts of hurting yourself or others, or thoughts of ending your life. ?Spending a lot of time alone. ?Inability to  complete common tasks of daily life. ?If you have very bad MDD, you may: ?Believe things that are not true. ?Hear, see, taste, or feel things that are not there. ?Have mild depression that lasts for at least 2 years. ?Feel very sad and hopeless. ?Have trouble speaking or moving. ?How is this treated? ?This condition may be treated with: ?Talk therapy. This teaches you to know bad thoughts, feelings, and actions and how to change them. ?This can also help you to communicate with others. ?This can be done with members of your family. ?Medicines. These can be used to treat worry (anxiety), depression, or low levels of chemicals in the brain. ?Lifestyle changes. You may need to: ?Limit alcohol use. ?Limit drug use. ?Get regular exercise. ?Get plenty of sleep. ?Make healthy eating choices. ?Spend more time outdoors. ?Brain stimulation. This treatment excites the brain. This is done when symptoms are very bad or have not gotten better with other treatments. ?Follow these instructions at home: ?Activity ?Get regular exercise as told. ?Spend time outdoors as told. ?Make time to do the things you enjoy. ?Find ways to deal with stress. Try to: ?Meditate. ?Do deep breathing. ?Spend time in nature. ?Keep a journal. ?Return to your normal activities as told by your doctor. Ask your doctor what activities are safe for you. ?Alcohol and drug use ?If you drink alcohol: ?Limit how much you use to: ?0-1 drink a day for women. ?0-2 drinks a day for men. ?Be aware of how much alcohol is in your drink. In the [place] one drink equals one 12 oz bottle of beer (355 mL),  one 5 oz glass of wine (148 mL), or one 1? oz glass of hard liquor (44 mL). ?Talk to your doctor about: ?Alcohol use. Alcohol can affect some medicines. ?Any drug use. ?General instructions ? ?Take over-the-counter and prescription medicines and herbal preparations only as told by your doctor. ?Eat a healthy diet. ?Get a lot of sleep. ?Think about joining a support group.  Your doctor may be able to suggest one. ?Keep all follow-up visits as told by your doctor. This is important. ?Where to find more information: ?National Alliance on Mental Illness: www.nami.org ?U.[employer] of Mental Health: https://carter.com/ ?American Psychiatric Association: www.psychiatry.org/patients-families/ ?Contact a doctor if: ?Your symptoms get worse. ?You get new symptoms. ?Get help right away if: ?You hurt yourself. ?You have serious thoughts about hurting yourself or others. ?You see, hear, taste, smell, or feel things that are not there. ?If you ever feel like you may hurt yourself or others, or have thoughts about taking your own life, get help right away. Go to your nearest emergency department or: ?Call your local emergency services (911 in the [place] ?Call a suicide crisis helpline, such as the [place] at (401)861-5961 or 988 in the University. This is open 24 hours a day in the [place] ?Text the Crisis Text Line at (671)343-3022 (in the [place].). ?Summary ?Major depressive disorder is a mental health condition. This disorder affects feelings. Symptoms of this condition last most of the day, almost every day, for 2 weeks. ?The symptoms of this disorder can cause problems with relationships and with daily activities. ?There are treatments and support for people who get this disorder. You may need more than one type of treatment. ?Get help right away if you have serious thoughts about hurting yourself or others. ?This information is not intended to replace advice given to you by your health care provider. Make sure you discuss any questions you have with your health care provider. ?Document Revised: 05/25/2021 Document Reviewed: 10/11/2019 ?Elsevier Patient Education ? [place]. ? ?

## 2022-01-18 NOTE — Progress Notes (Signed)
?   01/18/22 1410  ?[place] Triage Screening (Walk-ins at [facility] only)  ?How Did You Hear About [place]? Legal System  ?What Is the Reason for Your Visit/Call Today? (Triage 1300-late entry) Patient presents via [facility] from [place] reporting worsening depression due to "losing a lot of people."  Patient is distressed, overwhelmed with grief, sobbing during most of the assessment.  She lost her husband 2 years ago.  Tatoo on arm appears to be of her husband. When asked, patient began to sob again and unable to answer.  She struggles to engage in assessment due to her mental state.  She endorses SI, stating "I just shouldn't be here.  I don't want to be here anymore."  She also shares she lost other family members in the past 2 years to include her mother-in-law, siste, sister-in-law, uncle, aunt and within the last 2 wks she lost her aunt and uncle in-law.  Patient appears lethargic and struggles to keep her eyes open.  She affirms her safety and states she wants to go home.  She denies HI, AVH or SA hx outside of some THC use (cutting back).  ?How Long Has This Been Causing You Problems? > than 6 months  ?Have You Recently Had Any Thoughts About Hurting Yourself? Yes  ?How long ago did you have thoughts about hurting yourself? SI, does not disclose a plan and states she can be safe.  Reliability is questionable, given patient's mental state.  ?Are You Planning to Commit Suicide/Harm Yourself At This time? No  ?Have you Recently Had Thoughts About [place]? No  ?Are You Planning To Harm Someone At This Time? No  ?Are you currently experiencing any auditory, visual or other hallucinations? No  ?Have You Used Any Alcohol or Drugs in the Past 24 Hours? No  ?Do you have any current medical co-morbidities that require immediate attention? No  ?Clinician description of patient physical appearance/behavior: Despondent, lethargic, sobbing  ?What Do You Feel Would Help You the Most Today? Treatment for Depression  or other mood problem  ?If access to [facility] was not available, would you have sought care in the Emergency Department? Yes  ?Determination of Need Urgent (48 hours)  ?Options For Referral Inpatient Hospitalization;Facility-Based Crisis;BH Urgent Care;Outpatient Therapy  ? ? ?

## 2022-01-18 NOTE — Assessment & Plan Note (Addendum)
A1C of 6.6 today. ?Surprisingly at goal, despite being off Jardiance for 2 months. ?We will hold Jardiance for now and monitor home CBG closely. ?Staff will reach out to schedule video visit for DM management. ?

## 2022-01-18 NOTE — ED Provider Notes (Signed)
Behavioral Health Urgent Care Medical Screening Exam ? ?Patient Name: Tanya Nolan ?MRN: [id number] ?Date of Evaluation: 01/18/22 ?Chief Complaint:   ?Diagnosis:  ?Final diagnoses:  ?Suicidal ideation  ?Severe episode of recurrent major depressive disorder, without psychotic features ([place])  ? ? ?History of Present illness: Tanya Nolan is a 46 y.o. female. Patient presents voluntarily to [facility] behavioral health for walk-in assessment.  Patient encouraged by primary care provider to seek evaluation. ? ?[name] endorsed suicidal ideation to primary care provider at a scheduled appointment earlier this date.  She reported a plan to stop taking her prescription medications in an effort to "die from not treating her chronic diseases." ? ?Patient endorses passive suicidality.  She states "I just do not want to be here sometimes."  She denies plan to complete suicide.   She denies nonsuicidal self-harm behavior.  Lenyx endorses history of 1 previous suicide attempt, reports it was "years ago."  She does not disclose details surrounding previous suicide attempt.  She contracts verbally for safety with this Probation officer. ? ?Recent stressors include "I do not get enough sleep and I worry too much."  Patient reports she worked overnight last night. ? ?[name] has been diagnosed with depression and is followed by outpatient psychiatry at [facility] behavioral health.  She was last seen by outpatient psychiatry medication management provider approximately 6 months ago.  She reports she stopped taking her medications including Abilify, Zoloft and trazodone several months ago.  She last met with her counselor approximately 1 year ago.  She denies history of inpatient psychiatric hospitalization.  No family mental health history reported. ? ?Patient reports recent stressors include the death of her husband 2 years ago.  She states "everyone around me is dying lately."  Patient shares that in the last 2 years she has lost her  mother-in-law, her sister-in-law, her sister and her husband's aunt and uncle.  Husband's aunt and uncle both passed away in the last 2 weeks. ?  ?Patient is assessed face-to-face by nurse practitioner.  She is seated in assessment area, no acute distress.  She is alert and oriented,  cooperative during assessment. She presents with depressed mood, tearful affect.  She denies homicidal ideations.    She has normal speech and behavior.  She denies auditory and visual hallucinations.  Patient is able to converse coherently with goal-directed thoughts and no distractibility or preoccupation.  She denies paranoia.  Objectively there is no evidence of psychosis/mania or delusional thinking. ? ?Tanya Nolan resides in [place], her mother lives in her home.  She denies access to weapons.  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  She denies alcohol and substance use from marijuana.  She endorses frequent marijuana use, most recent use of marijuana 2 days ago.  She reports she has recently cut back on marijuana use as she is "trying to quit.".  Patient endorses average appetite and decreased sleep.   ? ?Patient offered support and encouragement.  She gives verbal consent to speak with her mother, Tanya Nolan. ?Spoke with patient's mother, family, who agrees with plan for patient to discharge home and follow-up with outpatient psychiatry.  Patient's mother Tanya Nolan denies safety concerns at this time.  Patient's mother will pick up patient this afternoon. Patient's mother verbalizes understanding of strict return precautions. ? ?Patient's mother and patient are educated and verbalize understanding of mental health resources and other crisis services in the community. She is instructed to call 911 and present to the nearest emergency room  should she experience any suicidal/homicidal ideation, auditory/visual/hallucinations, or detrimental worsening of her mental health condition.   ? ?Psychiatric Specialty Exam ? ?Presentation   ?General Appearance:Appropriate for Environment; Casual ? ?Eye Contact:Fair ? ?Speech:Clear and Coherent; Normal Rate ? ?Speech Volume:Normal ? ?Handedness:Right ? ? ?Mood and Affect  ?Mood:Depressed ? ?Affect:Tearful; Depressed ? ? ?Thought Process  ?Thought Processes:Coherent; Goal Directed; Linear ? ?Descriptions of Associations:Intact ? ?Orientation:Full (Time, Place and Person) ? ?Thought Content:Logical; WDL ? Diagnosis of Schizophrenia or Schizoaffective disorder in past: No ?  Hallucinations:None ? ?Ideas of Reference:None ? ?Suicidal Thoughts:Yes, Passive ?Without Intent; Without Plan ? ?Homicidal Thoughts:No data recorded ? ?Sensorium  ?Memory:Immediate Good; Recent Fair ? ?Judgment:Fair ? ?Insight:Present ? ? ?Executive Functions  ?Concentration:Good ? ?Attention Span:Fair ? ?Recall:Good ? ?Fund of [place] ? ?Language:Good ? ? ?Psychomotor Activity  ?Psychomotor Activity:Normal ? ? ?Assets  ?Assets:Communication Skills; Desire for Improvement; Financial Resources/Insurance; Housing; Intimacy; Physical Health; Leisure Time; Resilience; Social Support ? ? ?Sleep  ?Sleep:Fair ? ?Number of hours: No data recorded ? ?No data recorded ? ?Physical Exam: ?Physical Exam ?Vitals and nursing note reviewed.  ?Constitutional:   ?   Appearance: Normal appearance. She is well-developed.  ?HENT:  ?   Head: Normocephalic and atraumatic.  ?   Nose: Nose normal.  ?Cardiovascular:  ?   Rate and Rhythm: Normal rate.  ?Pulmonary:  ?   Effort: Pulmonary effort is normal.  ?Musculoskeletal:     ?   General: Normal range of motion.  ?   Cervical back: Normal range of motion.  ?Skin: ?   General: Skin is warm and dry.  ?Neurological:  ?   Mental Status: She is alert and oriented to person, place, and time.  ?Psychiatric:     ?   Attention and Perception: Attention and perception normal.     ?   Mood and Affect: Mood is depressed. Affect is tearful.     ?   Speech: Speech normal.     ?   Behavior: Behavior normal. Behavior  is cooperative.     ?   Thought Content: Thought content includes suicidal ideation.     ?   Cognition and Memory: Cognition and memory normal.     ?   Judgment: Judgment normal.  ? ?Review of Systems  ?Constitutional: Negative.   ?HENT: Negative.    ?Eyes: Negative.   ?Respiratory: Negative.    ?Cardiovascular: Negative.   ?Gastrointestinal: Negative.   ?Genitourinary: Negative.   ?Musculoskeletal: Negative.   ?Skin: Negative.   ?Neurological: Negative.   ?Endo/Heme/Allergies: Negative.   ?Psychiatric/Behavioral:  Positive for depression and suicidal ideas.   ?Blood pressure (!) 150/91, pulse 62, temperature 98.3 ?F (36.8 ?C), temperature source Oral, resp. rate 16, last menstrual period 01/08/2022, SpO2 100 %. There is no height or weight on file to calculate BMI. ? ?Musculoskeletal: ?Strength & Muscle Tone: within normal limits ?Gait & Station: normal ?Patient leans: N/A ? ? ?[facility] Discharge Disposition for Follow up and Recommendations: ?Based on my evaluation the patient does not appear to have an emergency medical condition and can be discharged with resources and follow up care in outpatient services for Medication Management and Individual Therapy ?Patient reviewed with Dr. Serafina Mitchell. ?Follow-up with outpatient psychiatry, resources provided. ? ?Lucky Rathke, FNP ?01/18/2022, 6:15 PM ? ?

## 2022-01-18 NOTE — Assessment & Plan Note (Signed)
Med refilled.

## 2022-01-18 NOTE — Progress Notes (Addendum)
? ? ?SUBJECTIVE:  ? ?CHIEF COMPLAINT / HPI:  ? ?HPI: ?Depression: ?She endorses daily SI. She denies having SI today. She also confirms that she will not hurt herself despite this thought. She has been more depressed lately due to a death in the family. She has been off all medications for about two months. She does not take her medications in the hope that she will die from not treating her chronic diseases. ? ?STD screen: ?Denies vaginal discharge. She is sexually active with the same partner. Sometimes uses protection. She requested an STD screening. No symptoms.  ? ?DM2/HLD/Hypothyroidism/HTN: ?Off all meds for 2 months or more. She is here for follow-up. ? ?PERTINENT  PMH / PSH: PMHx reviewed ? ?OBJECTIVE:  ? ?BP (!) 165/91   Pulse 85   Ht '5\' 7"'$  (1.702 m)   Wt 215 lb 6.4 oz (97.7 kg)   LMP 01/08/2022   SpO2 100%   BMI 33.74 kg/m?   ?Physical Exam ?Vitals and nursing note reviewed. Exam conducted with a chaperone present Lavell Anchors).  ?Cardiovascular:  ?   Rate and Rhythm: Normal rate and regular rhythm.  ?   Heart sounds: Normal heart sounds. No murmur heard. ?Pulmonary:  ?   Effort: Pulmonary effort is normal. No respiratory distress.  ?   Breath sounds: Normal breath sounds. No wheezing.  ?Abdominal:  ?   General: Abdomen is flat. Bowel sounds are normal. There is no distension.  ?   Palpations: Abdomen is soft.  ?   Tenderness: There is no abdominal tenderness.  ?Genitourinary: ?   Vagina: Normal.  ?   Cervix: Normal.  ?   Uterus: Normal.   ?   Adnexa: Right adnexa normal.  ?Feet:  ?   Comments: Sensory exam of the foot is normal, tested with the monofilament. Good pulses, no lesions or ulcers, good peripheral pulses. ? ?Psychiatric:     ?   Attention and Perception: Perception normal.     ?   Mood and Affect: Mood is depressed.     ?   Speech: Speech normal.     ?   Thought Content: Thought content includes suicidal ideation. Thought content does not include homicidal ideation. Thought content  does not include homicidal or suicidal plan.     ?   Cognition and Memory: Cognition normal.     ?   Judgment: Judgment normal.  ?   Comments: She cried throughout the visit after completing her pelvic exam. ? ?  ? ?[place] Office Visit from 01/18/2022 in [place]  ?PHQ-9 Total Score 12  ? ?  ? ? ? ?ASSESSMENT/PLAN:  ?STD screen: ?Patient checked + SI on PHQ9. ?I advised her that we need to hold off on her pelvic exam today to address her depression. ?She then denied SI and requested to proceed with a pelvic exam. ?GC/Chlam checked. ?HIV/RPR ordered. ?I will contact her soon with the results. ? ?Major depressive disorder ([place]) ?Poor medication adherence. ?Initially denies SI today, but she wishes to be seen by Psych today. ?Medication compliance counseling and support were provided. ?After the pelvic exam, she continues to cry non-stop. ?I discussed transfer to Behavioral health urgent care with safe transportation. ?She agreed with the plan. ?I called safe transportation and transferred her to the [facility] center. ?She will return to pick up her vehicle here once her evaluation and treatment is completed. ? ?HYPERTENSION, BENIGN SYSTEMIC ?Off meds. ?I offered to recheck  elevated BP today. ?Given that she cried continuously, we both decided to hold off on BP recheck. ?She will return for recheck. ?Med refilled. ? ?Type 2 diabetes mellitus with hyperlipidemia ([place]) ?A1C of 6.6 today. ?Surprisingly at goal, despite being off Jardiance for 2 months. ?We will hold Jardiance for now and monitor home CBG closely. ?Staff will reach out to schedule video visit for DM management. ? ?Hyperlipidemia ?Med refilled ? ?Hypothyroidism ?Med refilled.  ?  ?More than 50% of this 45 minutes appointment was spent on counseling and coordination of care. ? ?Andrena Mews, MD ?[place]  ? ?

## 2022-01-18 NOTE — Assessment & Plan Note (Signed)
Off meds. ?I offered to recheck elevated BP today. ?Given that she cried continuously, we both decided to hold off on BP recheck. ?She will return for recheck. ?Med refilled. ?

## 2022-01-18 NOTE — Discharge Instructions (Addendum)
You are encouraged to return to [facility] should your symptoms worsen. ?[address] ?[place], [place] ?[phone] ? ?Grief Support Groups: ?[facility] ?[place] ?[place] ?[place], [place] [address] ?[phone] ? ?Grief Support Group ?Location: ?[place] ?[place], [place] ?The Grief Support Group meets each Monday evening at [facility], and is free and open to the public. For more information, call [phone]. ?

## 2022-01-18 NOTE — Assessment & Plan Note (Signed)
Poor medication adherence. ?Initially denies SI today, but she wishes to be seen by Psych today. ?Medication compliance counseling and support were provided. ?After the pelvic exam, she continues to cry non-stop. ?I discussed transfer to Behavioral health urgent care with safe transportation. ?She agreed with the plan. ?I called safe transportation and transferred her to the [facility] center. ?She will return to pick up her vehicle here once her evaluation and treatment is completed. ?
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
## 2022-01-19 ENCOUNTER — Encounter: Payer: Self-pay | Admitting: Family Medicine

## 2022-01-19 LAB — CERVICOVAGINAL ANCILLARY ONLY
Chlamydia: NEGATIVE
Comment: NEGATIVE
Comment: NEGATIVE
Comment: NORMAL
Neisseria Gonorrhea: NEGATIVE
Trichomonas: NEGATIVE

## 2022-01-19 LAB — RPR: RPR Ser Ql: NONREACTIVE

## 2022-01-19 LAB — HIV ANTIBODY (ROUTINE TESTING W REFLEX): HIV Screen 4th Generation wRfx: NONREACTIVE

## 2022-01-19 NOTE — Progress Notes (Signed)
Please contact patient that all her STD tests were negative. Follow-up soon should she have additional question.

## 2022-02-03 ENCOUNTER — Telehealth (HOSPITAL_COMMUNITY): Payer: Self-pay | Admitting: Family Medicine

## 2022-02-03 NOTE — BH Assessment (Signed)
Care Management - [place] Follow Up Discharges  ? ?Writer attempted to make contact with patient today and was unsuccessful.  Writer left a HIPPA compliant voice message.  ? ?Per chart review, patient will follow up with established provider at [facility] on the 2nd floor with Eulis Canner, NP for medication management on 03-23-2022 ?

## 2022-02-09 DIAGNOSIS — G5761 Lesion of plantar nerve, right lower limb: Secondary | ICD-10-CM | POA: Insufficient documentation

## 2022-02-09 DIAGNOSIS — S92211A Displaced fracture of cuboid bone of right foot, initial encounter for closed fracture: Secondary | ICD-10-CM | POA: Insufficient documentation

## 2022-03-13 ENCOUNTER — Other Ambulatory Visit: Payer: Self-pay

## 2022-03-13 ENCOUNTER — Observation Stay (HOSPITAL_COMMUNITY): Payer: Medicaid Other

## 2022-03-13 ENCOUNTER — Emergency Department (HOSPITAL_COMMUNITY): Payer: Medicaid Other

## 2022-03-13 ENCOUNTER — Inpatient Hospital Stay (HOSPITAL_COMMUNITY)
Admission: EM | Admit: 2022-03-13 | Discharge: 2022-03-21 | DRG: 234 | Disposition: A | Discharge status: Home / Self Care | Payer: Medicaid Other | Attending: Surgery | Admitting: Surgery

## 2022-03-13 ENCOUNTER — Encounter (HOSPITAL_COMMUNITY): Payer: Self-pay | Admitting: Emergency Medicine

## 2022-03-13 ENCOUNTER — Observation Stay (HOSPITAL_BASED_OUTPATIENT_CLINIC_OR_DEPARTMENT_OTHER): Payer: Medicaid Other

## 2022-03-13 ENCOUNTER — Ambulatory Visit: Payer: Medicaid Other | Admitting: Family Medicine

## 2022-03-13 DIAGNOSIS — F32A Depression, unspecified: Secondary | ICD-10-CM

## 2022-03-13 DIAGNOSIS — Z6833 Body mass index (BMI) 33.0-33.9, adult: Secondary | ICD-10-CM

## 2022-03-13 DIAGNOSIS — I25119 Atherosclerotic heart disease of native coronary artery with unspecified angina pectoris: Secondary | ICD-10-CM | POA: Diagnosis present

## 2022-03-13 DIAGNOSIS — Z832 Family history of diseases of the blood and blood-forming organs and certain disorders involving the immune mechanism: Secondary | ICD-10-CM

## 2022-03-13 DIAGNOSIS — F17210 Nicotine dependence, cigarettes, uncomplicated: Secondary | ICD-10-CM | POA: Diagnosis present

## 2022-03-13 DIAGNOSIS — R569 Unspecified convulsions: Secondary | ICD-10-CM | POA: Diagnosis not present

## 2022-03-13 DIAGNOSIS — E039 Hypothyroidism, unspecified: Secondary | ICD-10-CM | POA: Diagnosis present

## 2022-03-13 DIAGNOSIS — R778 Other specified abnormalities of plasma proteins: Secondary | ICD-10-CM | POA: Diagnosis not present

## 2022-03-13 DIAGNOSIS — R55 Syncope and collapse: Secondary | ICD-10-CM | POA: Diagnosis present

## 2022-03-13 DIAGNOSIS — I2510 Atherosclerotic heart disease of native coronary artery without angina pectoris: Secondary | ICD-10-CM | POA: Diagnosis present

## 2022-03-13 DIAGNOSIS — Z794 Long term (current) use of insulin: Secondary | ICD-10-CM

## 2022-03-13 DIAGNOSIS — Z951 Presence of aortocoronary bypass graft: Secondary | ICD-10-CM

## 2022-03-13 DIAGNOSIS — F329 Major depressive disorder, single episode, unspecified: Secondary | ICD-10-CM | POA: Diagnosis present

## 2022-03-13 DIAGNOSIS — Z833 Family history of diabetes mellitus: Secondary | ICD-10-CM

## 2022-03-13 DIAGNOSIS — I152 Hypertension secondary to endocrine disorders: Secondary | ICD-10-CM | POA: Diagnosis present

## 2022-03-13 DIAGNOSIS — E669 Obesity, unspecified: Secondary | ICD-10-CM | POA: Diagnosis present

## 2022-03-13 DIAGNOSIS — I240 Acute coronary thrombosis not resulting in myocardial infarction: Secondary | ICD-10-CM

## 2022-03-13 DIAGNOSIS — I119 Hypertensive heart disease without heart failure: Secondary | ICD-10-CM | POA: Diagnosis present

## 2022-03-13 DIAGNOSIS — E1169 Type 2 diabetes mellitus with other specified complication: Secondary | ICD-10-CM | POA: Diagnosis present

## 2022-03-13 DIAGNOSIS — Z79890 Hormone replacement therapy (postmenopausal): Secondary | ICD-10-CM

## 2022-03-13 DIAGNOSIS — E7800 Pure hypercholesterolemia, unspecified: Secondary | ICD-10-CM | POA: Diagnosis present

## 2022-03-13 DIAGNOSIS — R32 Unspecified urinary incontinence: Secondary | ICD-10-CM | POA: Diagnosis present

## 2022-03-13 DIAGNOSIS — I10 Essential (primary) hypertension: Secondary | ICD-10-CM | POA: Diagnosis present

## 2022-03-13 DIAGNOSIS — Z79899 Other long term (current) drug therapy: Secondary | ICD-10-CM

## 2022-03-13 DIAGNOSIS — E785 Hyperlipidemia, unspecified: Secondary | ICD-10-CM | POA: Diagnosis present

## 2022-03-13 DIAGNOSIS — Z8249 Family history of ischemic heart disease and other diseases of the circulatory system: Secondary | ICD-10-CM

## 2022-03-13 DIAGNOSIS — I214 Non-ST elevation (NSTEMI) myocardial infarction: Principal | ICD-10-CM | POA: Diagnosis present

## 2022-03-13 DIAGNOSIS — F439 Reaction to severe stress, unspecified: Secondary | ICD-10-CM | POA: Diagnosis present

## 2022-03-13 DIAGNOSIS — F419 Anxiety disorder, unspecified: Secondary | ICD-10-CM | POA: Diagnosis present

## 2022-03-13 DIAGNOSIS — Z720 Tobacco use: Secondary | ICD-10-CM | POA: Diagnosis present

## 2022-03-13 DIAGNOSIS — Z818 Family history of other mental and behavioral disorders: Secondary | ICD-10-CM

## 2022-03-13 DIAGNOSIS — F1290 Cannabis use, unspecified, uncomplicated: Secondary | ICD-10-CM | POA: Diagnosis present

## 2022-03-13 DIAGNOSIS — E1159 Type 2 diabetes mellitus with other circulatory complications: Secondary | ICD-10-CM | POA: Diagnosis present

## 2022-03-13 DIAGNOSIS — R079 Chest pain, unspecified: Secondary | ICD-10-CM | POA: Diagnosis not present

## 2022-03-13 DIAGNOSIS — E282 Polycystic ovarian syndrome: Secondary | ICD-10-CM | POA: Diagnosis present

## 2022-03-13 DIAGNOSIS — R059 Cough, unspecified: Secondary | ICD-10-CM

## 2022-03-13 DIAGNOSIS — I259 Chronic ischemic heart disease, unspecified: Secondary | ICD-10-CM

## 2022-03-13 DIAGNOSIS — IMO0002 Reserved for concepts with insufficient information to code with codable children: Secondary | ICD-10-CM

## 2022-03-13 DIAGNOSIS — G8929 Other chronic pain: Secondary | ICD-10-CM | POA: Diagnosis present

## 2022-03-13 DIAGNOSIS — D62 Acute posthemorrhagic anemia: Secondary | ICD-10-CM | POA: Diagnosis not present

## 2022-03-13 LAB — CBC WITH DIFFERENTIAL/PLATELET
Abs Immature Granulocytes: 0.08 10*3/uL — ABNORMAL HIGH (ref 0.00–0.07)
Basophils Absolute: 0 10*3/uL (ref 0.0–0.1)
Basophils Relative: 0 %
Eosinophils Absolute: 0.2 10*3/uL (ref 0.0–0.5)
Eosinophils Relative: 1 %
HCT: 40.7 % (ref 36.0–46.0)
Hemoglobin: 13.4 g/dL (ref 12.0–15.0)
Immature Granulocytes: 1 %
Lymphocytes Relative: 22 %
Lymphs Abs: 3.4 10*3/uL (ref 0.7–4.0)
MCH: 29.4 pg (ref 26.0–34.0)
MCHC: 32.9 g/dL (ref 30.0–36.0)
MCV: 89.3 fL (ref 80.0–100.0)
Monocytes Absolute: 1.2 10*3/uL — ABNORMAL HIGH (ref 0.1–1.0)
Monocytes Relative: 7 %
Neutro Abs: 10.9 10*3/uL — ABNORMAL HIGH (ref 1.7–7.7)
Neutrophils Relative %: 69 %
Platelets: 257 10*3/uL (ref 150–400)
RBC: 4.56 MIL/uL (ref 3.87–5.11)
RDW: 14.2 % (ref 11.5–15.5)
WBC: 15.7 10*3/uL — ABNORMAL HIGH (ref 4.0–10.5)
nRBC: 0 % (ref 0.0–0.2)

## 2022-03-13 LAB — ECHOCARDIOGRAM COMPLETE
AR max vel: 2.76 cm2
AV Peak grad: 8.1 mmHg
Ao pk vel: 1.43 m/s
Area-P 1/2: 2.5 cm2
Height: 67 in
S' Lateral: 3 cm
Weight: 3446.23 oz

## 2022-03-13 LAB — COMPREHENSIVE METABOLIC PANEL
ALT: 19 U/L (ref 0–44)
AST: 25 U/L (ref 15–41)
Albumin: 3.6 g/dL (ref 3.5–5.0)
Alkaline Phosphatase: 59 U/L (ref 38–126)
Anion gap: 10 (ref 5–15)
BUN: 8 mg/dL (ref 6–20)
CO2: 24 mmol/L (ref 22–32)
Calcium: 8.8 mg/dL — ABNORMAL LOW (ref 8.9–10.3)
Chloride: 103 mmol/L (ref 98–111)
Creatinine, Ser: 0.81 mg/dL (ref 0.44–1.00)
GFR, Estimated: >60 mL/min (ref >60)
Glucose, Bld: 160 mg/dL — ABNORMAL HIGH (ref 70–99)
Potassium: 3.7 mmol/L (ref 3.5–5.1)
Sodium: 137 mmol/L (ref 135–145)
Total Bilirubin: 0.4 mg/dL (ref 0.3–1.2)
Total Protein: 6.8 g/dL (ref 6.5–8.1)

## 2022-03-13 LAB — URINALYSIS, ROUTINE W REFLEX MICROSCOPIC
Bacteria, UA: NONE SEEN
Bilirubin Urine: NEGATIVE
Glucose, UA: NEGATIVE mg/dL
Hgb urine dipstick: NEGATIVE
Ketones, ur: NEGATIVE mg/dL
Leukocytes,Ua: NEGATIVE
Nitrite: NEGATIVE
Protein, ur: 100 mg/dL — AB
Specific Gravity, Urine: 1.012 (ref 1.005–1.030)
pH: 7 (ref 5.0–8.0)

## 2022-03-13 LAB — RAPID URINE DRUG SCREEN, HOSP PERFORMED
Amphetamines: NOT DETECTED
Barbiturates: NOT DETECTED
Benzodiazepines: NOT DETECTED
Cocaine: NOT DETECTED
Opiates: NOT DETECTED
Tetrahydrocannabinol: NOT DETECTED

## 2022-03-13 LAB — TSH: TSH: 8.574 u[IU]/mL — ABNORMAL HIGH (ref 0.350–4.500)

## 2022-03-13 LAB — ETHANOL: Alcohol, Ethyl (B): <10 mg/dL (ref <10)

## 2022-03-13 LAB — I-STAT BETA HCG BLOOD, ED (MC, WL, AP ONLY): I-stat hCG, quantitative: <5 m[IU]/mL (ref <5)

## 2022-03-13 LAB — TROPONIN I (HIGH SENSITIVITY)
Troponin I (High Sensitivity): 49 ng/L — ABNORMAL HIGH (ref <18)
Troponin I (High Sensitivity): 146 ng/L (ref <18)
Troponin I (High Sensitivity): 133 ng/L (ref <18)
Troponin I (High Sensitivity): 112 ng/L (ref <18)

## 2022-03-13 LAB — CBG MONITORING, ED: Glucose-Capillary: 111 mg/dL — ABNORMAL HIGH (ref 70–99)

## 2022-03-13 MED ORDER — LORAZEPAM 2 MG/ML IJ SOLN: 2.0000 mg | INTRAMUSCULAR | Status: DC | PRN

## 2022-03-13 MED ORDER — ACETAMINOPHEN 650 MG RE SUPP: 650.0000 mg | Freq: Four times a day (QID) | RECTAL | Status: DC | PRN

## 2022-03-13 MED ORDER — ACETAMINOPHEN 325 MG PO TABS
650.0000 mg | ORAL_TABLET | Freq: Four times a day (QID) | ORAL | Status: DC | PRN
  Administered 2022-03-15: 650 mg via ORAL
  Filled 2022-03-13: qty 2

## 2022-03-13 MED ORDER — SERTRALINE HCL 100 MG PO TABS
100.0000 mg | ORAL_TABLET | Freq: Every day | ORAL | Status: DC
  Administered 2022-03-13: 100 mg via ORAL
  Filled 2022-03-13: qty 1

## 2022-03-13 MED ORDER — SERTRALINE HCL 50 MG PO TABS
50.0000 mg | ORAL_TABLET | Freq: Every day | ORAL | Status: DC
  Administered 2022-03-14 – 2022-03-15 (×2): 50 mg via ORAL
  Filled 2022-03-13 (×2): qty 1

## 2022-03-13 MED ORDER — ARIPIPRAZOLE 5 MG PO TABS
5.0000 mg | ORAL_TABLET | Freq: Every day | ORAL | Status: DC
  Administered 2022-03-13 – 2022-03-14 (×2): 5 mg via ORAL
  Filled 2022-03-13 (×2): qty 1

## 2022-03-13 MED ORDER — ATORVASTATIN CALCIUM 80 MG PO TABS
80.0000 mg | ORAL_TABLET | Freq: Every day | ORAL | Status: DC
  Administered 2022-03-13 – 2022-03-21 (×8): 80 mg via ORAL
  Filled 2022-03-13 (×6): qty 1
  Filled 2022-03-13: qty 2

## 2022-03-13 MED ORDER — ASPIRIN 81 MG PO CHEW
324.0000 mg | CHEWABLE_TABLET | Freq: Once | ORAL | Status: AC
  Administered 2022-03-13: 324 mg via ORAL
  Filled 2022-03-13: qty 4

## 2022-03-13 MED ORDER — ENALAPRIL MALEATE 2.5 MG PO TABS
2.5000 mg | ORAL_TABLET | Freq: Every day | ORAL | Status: DC
  Administered 2022-03-13 – 2022-03-15 (×3): 2.5 mg via ORAL
  Filled 2022-03-13 (×5): qty 1

## 2022-03-13 MED ORDER — LEVOTHYROXINE SODIUM 25 MCG PO TABS
125.0000 ug | ORAL_TABLET | Freq: Every day | ORAL | Status: DC
  Administered 2022-03-13 – 2022-03-21 (×8): 125 ug via ORAL
  Filled 2022-03-13 (×8): qty 1

## 2022-03-13 MED ORDER — EZETIMIBE 10 MG PO TABS
10.0000 mg | ORAL_TABLET | Freq: Every day | ORAL | Status: DC
  Administered 2022-03-13 – 2022-03-14 (×2): 10 mg via ORAL
  Filled 2022-03-13 (×2): qty 1

## 2022-03-13 MED ORDER — ENOXAPARIN SODIUM 40 MG/0.4ML IJ SOSY
40.0000 mg | PREFILLED_SYRINGE | INTRAMUSCULAR | Status: DC
  Administered 2022-03-13 – 2022-03-14 (×2): 40 mg via SUBCUTANEOUS
  Filled 2022-03-13 (×2): qty 0.4

## 2022-03-13 NOTE — Procedures (Signed)
Patient Name: Tanya Nolan  ?MRN: [id number]  ?Epilepsy Attending: Lora Havens  ?Referring Physician/Provider: Truddie Hidden, MD ?Date: 03/13/2022 ?Duration: 24.26 mins ? ?Patient history: 46 year old female with seizure-like activity.  EEG to evaluate for seizure. ? ?Level of alertness: Awake, asleep ? ?AEDs during EEG study: None ? ?Technical aspects: This EEG study was done with scalp electrodes positioned according to the 10-20 International system of electrode placement. Electrical activity was acquired at a sampling rate of '500Hz'$  and reviewed with a high frequency filter of '70Hz'$  and a low frequency filter of '1Hz'$ . EEG data were recorded continuously and digitally stored.  ? ?Description: During awake state, no clear posterior dominant rhythm was seen.  EEG background consisted of 12 to 13 Hz low amplitude beta activity.  Sleep was characterized by vertex waves, sleep spindles (12 to 14 Hz), maximal frontocentral region.  Hyperventilation and photic stimulation were not performed.    ? ?IMPRESSION: ?This study is within normal limits. No seizures or epileptiform discharges were seen throughout the recording. ? ?Lora Havens  ? ?
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
## 2022-03-13 NOTE — Progress Notes (Signed)
I attempted to call brother Kaybree Williams [phone]) to obtain collateral but was unable to reach him. ?

## 2022-03-13 NOTE — Progress Notes (Signed)
EEG complete - results pending 

## 2022-03-13 NOTE — ED Notes (Signed)
?  Test: trop ?Critical Value: 146 ? ?Name of Provider Notified: Dr. Karlton Lemon ? ? ?

## 2022-03-13 NOTE — H&P (Addendum)
Family Medicine Teaching [facility] Admission History and Physical Service Pager: [phone]  Patient name: Tanya Nolan Medical record number: [id number] Date of birth: 04/03/1976 Age: 46 y.o. Gender: female  Primary Care Provider: Doreene Eland, MD Consultants: Cardiology, Neurology Code Status: Full   Preferred Emergency Contact: Tanya Nolan, mother [phone] Contact Information     Name Relation Home Work Mobile   Tanya Nolan Mother [phone]  [phone]        Chief Complaint: Chest pain, elevated troponin's, seizure like activity.  Assessment and Plan: Tanya Nolan is a 46 y.o. female presenting with chest pain, and self reported seizure like activity. PMH is significant for HTN, DM, hypothyroidism, HLD, MDD.   Elevated Troponin's  Chest pain  Leg swelling Patient presented to ED with stable vitals and afebrile, labs were significant for a normal ethanol level, WBC 15.7, TSH 8.574, troponin's were 49 > 146, UA was positive for 100 protein. CXR was negative, and EKG was normal. On physical exam, patient noted to have soreness in chest with deep palpation. Differential includes stable angina given history of chest pain with exertion, and elevated troponin's. Patient could also be suffering from demand ischemia, or ACS given elevated troponin, chest pain but less likely with normal EKG. Less likely to be costochondritis, given elevated troponin's, despite reproducible chest pain with palpation. Will continue to monitor and trend troponin's -Admit to FPTS, attending Dr. Tivis Ringer -Cardiology following, appreciate recs -Continuous Cardiac Monitoring -Echocardiogram -Trend Troponin's every 2 hours until flat -AM CBC and BMP  Seizure like activity Patient reports to have had witnessed seizure requiring mouth to mouth. Patient reported she was post-ictal after seizure, and did report bladder incontinence. Patient has no history of seizures -Neurology following,  appreciate recs -Seizure precautions -Brain MRI -EEG  HTN Patient with history of HTN, taking enalapril 2.5 mg Nolan. BP 130-160/50-80's on admission. -continue home meds  DM2 Patient with history of DM2, last A1c 6.6 on 01/18/22. Patient not taking any medication. -continue to monitor -consider SSI -CBG 3x Nolan  Hypothyroidism Patient with history of hypothyroidism, taking synthroid 125 mcg Nolan. -continue home meds  HLD Patient with extensive family hx of MI in 56's, (mother, grandmother, cousin). Patient taking Lipitor 80 mg Nolan, last lipid profile 05/17/21 with T. Chol 210, Tri 172, LDL 153. Will consider lipid panel -consider lipid panel   MDD Patient with history of major depressive disorder, taking Abilify 5 mg Nolan, Zoloft 100 mg Nolan, trazodone 50 mg prn for sleep. -Continue home meds -hold trazodone  Tobacco abuse Patient reports smoking a half a pack a day for last 7 years, had stopped,  but restarted. Reports that she began smoking in her 20's.  -Consider nicotine patches  FEN/GI: Carb-modified diet Prophylaxis: Lovenox  Disposition: Admit to FPTS  History of Present Illness:  Tanya Nolan is a 46 y.o. female presenting with seizure-like activity. Accompanied by mother.  Patient states she was having intercourse with a friend when she started having an episode of chest pain and apparently had a seizure. Unclear what happened during this episode as her friend did not describe the seizure to her. She states that her friend gave her mouth-to-mouth resuscitation. She woke up, and took some time to get back to herself. She then drove to her brother's house, and her brother drove her to the hospital. She states she lost consciousness while en route to the hospital and states that there was difficulty arousing her when she got to the ED.  She reports urinary incontinence, and tongue biting. No history of seizures.  She reports she has had intermittent chest pain with  exertion for the past month associated with dyspnea. Does not radiate into her arm or neck. Improves with repositioning. No medications tried. She reports intermittent pain and swelling in her legs up to her hips for the past month. Denies orthopnea, PND. Current 0.5 ppd smoker. Denies recreational drug use and recent alcohol use. Extensive FMHx of CAD, multiple family members (including mother), had a heart attack in their 28s.  Review Of Systems: Per HPI with the following additions:   Review of Systems  Constitutional:  Negative for fever.  Eyes:  Negative for visual disturbance.  Respiratory:  Negative for shortness of breath.   Cardiovascular:  Positive for chest pain.  Gastrointestinal:  Negative for abdominal pain, diarrhea, nausea and vomiting.  Psychiatric/Behavioral:  Positive for decreased concentration.     Patient Active Problem List   Diagnosis Date Noted   Major depressive disorder, recurrent (HCC) 01/18/2022   Mild depression 07/14/2021   Nodule of skin of left foot 04/15/2021   Incontinence in female 10/05/2020   Grief 11/10/2019   Tobacco abuse 07/15/2019   Chronic back pain 09/19/2016   Hemorrhoids 02/02/2012   Hypothyroidism 01/10/2007   Type 2 diabetes mellitus with hyperlipidemia (HCC) 01/10/2007   POLYCYSTIC OVARY 01/10/2007   Hyperlipidemia 01/10/2007   HYPERTENSION, BENIGN SYSTEMIC 01/10/2007    Past Medical History: Past Medical History:  Diagnosis Date   Abdominal pain 03/26/2009   Qualifier: Diagnosis of  By: Jimmey Ralph MD, Jennifer     Anxiety    BACK STRAIN, LUMBAR 03/26/2009   Qualifier: Diagnosis of  By: Jimmey Ralph MD, Victorino Dike     Chalazion 10/01/2007   Qualifier: Diagnosis of  By: Karn Pickler MD, Jessica     Chronic back pain    tx with ibuprofen   Diabetes mellitus    Hypercholesteremia    Hyperlipidemia    no meds - tx with diet   Hypertension    Hypothyroidism    Ingrown toenail 12/25/2014   LGSIL (low grade squamous intraepithelial dysplasia)  08/2007   C&B WNL  NEG PAPS after   MDD (major depressive disorder), recurrent severe, 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   Nipple discharge 10/13/2013   Non compliance w medication regimen 10/13/2013   Simple endometrial hyperplasia 09/2006   BENIGN SECRETORY 02/2007   Thyroid dysfunction     Past Surgical History: Past Surgical History:  Procedure Laterality Date   DISC DECOMPRESSION  2008   HYSTEROSCOPY X 2  2007 / 2012   ENDOMETRIAL POLYPS REMOVED    Social History: Social History   Tobacco Use   Smoking status: Every Day    Packs/day: 1.00    Years: 8.00    Pack years: 8.00    Types: Cigarettes   Smokeless tobacco: Never  Vaping Use   Vaping Use: Never used  Substance Use Topics   Alcohol use: Yes    Alcohol/week: 0.0 standard drinks    Comment: socially - wine/liquor--Rare   Drug use: Yes    Frequency: 7.0 times per week    Types: Marijuana   Additional social history: current 0.5 ppd smoker intermittent since her 46s. Previously quit but started smoking again 7 years ago. Alcohol use socially, last drink was 2 weeks ago. Denies recreational drug use.  Please also refer to relevant sections of EMR.  Family History: Family History  Problem Relation Age of Onset   Lupus Mother  Hypertension Mother    Heart disease Father    Gout Father    Lupus Father    Hypertension Sister    Breast cancer Neg Hx     Allergies and Medications: No Known Allergies No current facility-administered medications on file prior to encounter.   Current Outpatient Medications on File Prior to Encounter  Medication Sig Dispense Refill   acetaminophen (TYLENOL) 500 MG tablet Take 1,000 mg by mouth every 6 (six) hours as needed for moderate pain or headache.     albuterol (PROAIR HFA) 108 (90 Base) MCG/ACT inhaler Inhale 1-2 puffs into the lungs every 6 (six) hours as needed for wheezing or shortness of breath. 18 g 1   ARIPiprazole (ABILIFY) 5 MG tablet Take 1 tablet (5 mg total) by  mouth Nolan. For mood control 30 tablet 3   atorvastatin (LIPITOR) 80 MG tablet Take 1 tablet (80 mg total) by mouth Nolan. 90 tablet 1   cetirizine (ZYRTEC) 10 MG tablet Take 1 tablet (10 mg total) by mouth Nolan. (Patient taking differently: Take 10 mg by mouth Nolan as needed for allergies.) 30 tablet 11   enalapril (VASOTEC) 2.5 MG tablet Take 1 tablet (2.5 mg total) by mouth Nolan. For high blood pressure 90 tablet 1   ezetimibe (ZETIA) 10 MG tablet Take 1 tablet (10 mg total) by mouth Nolan. 90 tablet 1   levothyroxine (SYNTHROID) 125 MCG tablet Take 1 tablet (125 mcg total) by mouth Nolan before breakfast. Reported on 02/23/2016 90 tablet 1   sertraline (ZOLOFT) 100 MG tablet Take 1 tablet (100 mg total) by mouth Nolan. For depression 30 tablet 3   traZODone (DESYREL) 50 MG tablet Take 1-2 tablets (50-100 mg total) by mouth at bedtime as needed for sleep. 60 tablet 3   Accu-Chek FastClix Lancets MISC Use to check CBG TID 100 each 12   BD ULTRA-FINE PEN NEEDLES 29G X 12.7MM MISC USE THREE TIMES A DAY BEFORE MEALS 1 each 2   Blood Glucose Monitoring Suppl (ACCU-CHEK GUIDE) w/Device KIT 1 applicator by Does not apply route 3 (three) times Nolan. Use to check CBG TID 1 kit 0   empagliflozin (JARDIANCE) 10 MG TABS tablet Take 1 tablet (10 mg total) by mouth Nolan. (Patient not taking: Reported on 01/18/2022) 90 tablet 3   glucose blood (ACCU-CHEK GUIDE) test strip USE TO CHECK GBG 3 TIMES A DAY 100 strip 1   hydrocortisone 2.5 % ointment Apply topically 2 (two) times Nolan. Limit use to one week (Patient not taking: Reported on 03/13/2022) 30 g 0   naproxen (NAPROSYN) 500 MG tablet Take 1 tablet (500 mg total) by mouth 2 (two) times Nolan with a meal. (Patient not taking: Reported on 03/13/2022) 20 tablet 0   polyethylene glycol powder (GLYCOLAX/MIRALAX) 17 GM/SCOOP powder Take 17 g by mouth Nolan as needed. (Patient not taking: Reported on 03/13/2022) 3350 g 1   [DISCONTINUED] insulin lispro  protamine-insulin lispro (HUMALOG 75/25) (75-25) 100 UNIT/ML SUSP Inject 40-50 Units into the skin 2 (two) times Nolan with a meal. Patient takes 40 units in the morning and 50 units at night.  Verified it is Humalog 75/25.      Objective: BP 137/72   Pulse 67   Temp (!) 97.3 F (36.3 C) (Oral)   Resp 16   Ht 5\' 7"  (1.702 m)   Wt 97.7 kg   SpO2 100%   BMI 33.73 kg/m  Exam: General: Well appearing, polite, NAD, African American woman Eyes: EOMI, clear  conjunctiva ENTM: MMM, no lacerations on tongue Neck: Soft Cardiovascular: RRR, NRMG Respiratory: CTABL Gastrointestinal: Soft, NTTP MSK: No leg edema Neuro: EOMI, Sensation grossly intact, slightly decreased sensation on left face compared to right, strength 5/5 in BUE and BLE Psych: Good mood and affect  Labs and Imaging: CBC BMET  Recent Labs  Lab 03/13/22 0015  WBC 15.7*  HGB 13.4  HCT 40.7  PLT 257   Recent Labs  Lab 03/13/22 0015  NA 137  K 3.7  CL 103  CO2 24  BUN 8  CREATININE 0.81  GLUCOSE 160*  CALCIUM 8.8*     EKG: My own interpretation (not copied from electronic read) Normal sinus with QTC 457.     Bess Kinds, MD 03/13/2022, 4:26 AM PGY-1, [facility] FPTS Intern pager: [phone], text pages welcome  46 year old female with history of T2DM, HTN, HLD, tobacco use, depression presenting with possible seizure-like activity and elevation in cardiac enzymes.  Reported seizure-like activity prior to arrival, details are very obscure but sounds like it may have been related to episode of chest pain.  Unclear if convulsions were present.  Based on report, it sounds like there is possible postictal period.  She is neurologically intact and at mental status baseline by the time arrival to the ED.  Consider epileptiform seizure (reported postictal phase), PNES (given history of depression), syncope.  Case was discussed with neurology in the ED, pursuing MRI brain and EEG.  Plan to reach back  out to neurology if any abnormalities.  She has elevated troponin in the setting of intermittent exertional chest pain for about 1 month.  No chest pain currently.  EKG did not show any ischemic changes.  Case was discussed with cardiology in the ED, no recommendation for heparin drip and recommended to evaluate further with TTE which is pending.  Low suspicion for ACS currently but certainly has many cardiac risk factors including early family history of MI, symptoms sound like possible stable angina.  Hypothyroidism on thyroid replacement with elevated TSH, will need outpatient follow-up.  I was personally present and performed or re-performed the history, physical exam and medical decision making activities of this service and have verified that the service and findings are accurately documented in the resident's note.  Littie Deeds, MD                  03/13/2022, 6:43 AM

## 2022-03-13 NOTE — Progress Notes (Signed)
Family Medicine Teaching Service ?Daily Progress Note ?Intern Pager: [phone] ? ?Patient name: Tanya Nolan Medical record number: [id number] ?Date of birth: 1976/06/11 Age: 46 y.o. Gender: female ? ?Primary Care Provider: [name], MD ?Consultants: Neurology ?Code Status: Full Code  ? ?Pt Overview and Major Events to Date:  ?5/1: Admitted ? ?Assessment and Plan: ?Tanya Nolan is a 46 y.o. female presented with self reported seizure-like activity and chest pain. ?PMH is significant for HTN, DM2, hypothyroidism, HLD, MDD.  ?HD 0  ? ?Transient Syncope vs Seizure  ?Low suspicion for seizure activity, especially given EEG within normal limits. Unclear of what happened during this time, unable to get in contact with witness for patient's brother. ?- MRI ordered, pending ?- Call brother for collateral again ? ?Elevated Troponin's  Exertional and positional chest pain, s/p asa '325mg'$  ?RR 22. Afebrile, no cp, no sob today. ACS w/u negative so far with down trending troponins 112,  EKG nml.  UDS negative. BAL <10. UDS negative. ECHO pending.  ?- ECHO f/u ? ?White count ?UA negative.  No fever. Reactive most likely ?- CBC ? ?TSH  ?TSH 8.574 ?- Continued home Synthroid 170mg ? ? ?HTN ?- Continued home Enalapril 2.5 ? ?DM2 ?Diet controlled ?-  CBGs ? ?HLD ?Unclear role of zetia, will get lipid panel to see if patient still needs it.  ?- Atorvastatin 80 and zetia '10mg'$  ?- lipid panel ? ?MDD ?Patient was not taking meds prior to admission for last 1-2 weeks. Already received zoloft '100mg'$  this AM, will continue with half dose to mitigate GI and other side effects.  ?- Zoloft 50 then up titrate as tolerated ?- Abilify 5 ? ?Tobacco abuse ?- NRT ?- Discussed cessation ? ?FEN/GI:    ?PPx: enoxaparin (LOVENOX) injection 40 mg Start: 03/13/22 1000  ?Dispo:Pending PT recommendations   pending workup . ? ?Subjective:  ?Mom was at bedside. ? ?KErinnwas seen this AM with EEG leads being placed. She is sleepy but awakens easily and  engaged with evaluation.  She reported that at about 10 PM she had an episode of brief LOC, and that her partner at the time described as a seizure.  She reported abrupt symptoms in the middle of intercourse, and was unsure how long it took her gain consciousness.  However about 11 PM she was able to safely drive to her brother's house for him to take her to the ED. ? ?She also reported chest pain for few weeks that is present on exertion, improved with rest.  Also has chest pain that occurs when she lays down. ? ?Mother at bedside reported that she is currently in school for nursing and working as a [employer]ologist MIL and BIL died about 2 months ago. Husband dead for 2-3 years in D27-Dec-2024 Sister died J01-27-21 She currently lives with mom, but has no idea what went on during patient's LOC episode.  ? ?Objective: ?Temp:  [97.3 ?F (36.3 ?C)-97.4 ?F (36.3 ?C)] 97.4 ?F (36.3 ?C) (05/01 0724) ?Pulse Rate:  [56-76] 62 (05/01 1515) ?Resp:  [15-26] 22 (05/01 1515) ?BP: (98-168)/(52-94) 120/64 (05/01 1515) ?SpO2:  [97 %-100 %] 100 % (05/01 1515) ?Weight:  [97.7 kg] 97.7 kg (05/01 0020) ?Physical Exam ?Vitals and nursing note reviewed.  ?Constitutional:   ?   General: She is awake. She is not in acute distress. ?   Appearance: She is not ill-appearing, toxic-appearing or diaphoretic.  ?HENT:  ?   Head: Normocephalic and atraumatic.  ?Cardiovascular:  ?  Rate and Rhythm: Normal rate and regular rhythm.  ?   Heart sounds: Normal heart sounds. No murmur heard. ?  No friction rub. No gallop.  ?Pulmonary:  ?   Effort: Pulmonary effort is normal. No respiratory distress.  ?   Breath sounds: Normal breath sounds. No wheezing or rales.  ?Abdominal:  ?   General: Bowel sounds are normal.  ?   Palpations: Abdomen is soft.  ?   Tenderness: There is no abdominal tenderness. There is no guarding or rebound.  ?Musculoskeletal:  ?   Right lower leg: No edema.  ?   Left lower leg: No edema.  ?Skin: ?   General: Skin is warm and dry.  ?Neurological:  ?    Mental Status: She is alert and oriented to person, place, and time.  ?Psychiatric:     ?   Behavior: Behavior is cooperative.  ?  ? ?Laboratory: ?Recent Labs  ?Lab 03/13/22 ?0015  ?WBC 15.7*  ?HGB 13.4  ?HCT 40.7  ?PLT 257  ? ?Recent Labs  ?Lab 03/13/22 ?0015  ?NA 137  ?K 3.7  ?CL 103  ?CO2 24  ?BUN 8  ?CREATININE 0.81  ?CALCIUM 8.8*  ?PROT 6.8  ?BILITOT 0.4  ?ALKPHOS 59  ?ALT 19  ?AST 25  ?GLUCOSE 160*  ? ?Results for orders placed or performed during the hospital encounter of 03/13/22 (from the past 24 hour(s))  ?Comprehensive metabolic panel     Status: Abnormal  ? Collection Time: 03/13/22 12:15 AM  ?Result Value Ref Range  ? Sodium 137 135 - 145 mmol/L  ? Potassium 3.7 3.5 - 5.1 mmol/L  ? Chloride 103 98 - 111 mmol/L  ? CO2 24 22 - 32 mmol/L  ? Glucose, Bld 160 (H) 70 - 99 mg/dL  ? BUN 8 6 - 20 mg/dL  ? Creatinine, Ser 0.81 0.44 - 1.00 mg/dL  ? Calcium 8.8 (L) 8.9 - 10.3 mg/dL  ? Total Protein 6.8 6.5 - 8.1 g/dL  ? Albumin 3.6 3.5 - 5.0 g/dL  ? AST 25 15 - 41 U/L  ? ALT 19 0 - 44 U/L  ? Alkaline Phosphatase 59 38 - 126 U/L  ? Total Bilirubin 0.4 0.3 - 1.2 mg/dL  ? GFR, Estimated >60 >60 mL/min  ? Anion gap 10 5 - 15  ?Ethanol     Status: None  ? Collection Time: 03/13/22 12:15 AM  ?Result Value Ref Range  ? Alcohol, Ethyl (B) <10 <10 mg/dL  ?CBC with Differential     Status: Abnormal  ? Collection Time: 03/13/22 12:15 AM  ?Result Value Ref Range  ? WBC 15.7 (H) 4.0 - 10.5 K/uL  ? RBC 4.56 3.87 - 5.11 MIL/uL  ? Hemoglobin 13.4 12.0 - 15.0 g/dL  ? HCT 40.7 36.0 - 46.0 %  ? MCV 89.3 80.0 - 100.0 fL  ? MCH 29.4 26.0 - 34.0 pg  ? MCHC 32.9 30.0 - 36.0 g/dL  ? RDW 14.2 11.5 - 15.5 %  ? Platelets 257 150 - 400 K/uL  ? nRBC 0.0 0.0 - 0.2 %  ? Neutrophils Relative % 69 %  ? Neutro Abs 10.9 (H) 1.7 - 7.7 K/uL  ? Lymphocytes Relative 22 %  ? Lymphs Abs 3.4 0.7 - 4.0 K/uL  ? Monocytes Relative 7 %  ? Monocytes Absolute 1.2 (H) 0.1 - 1.0 K/uL  ? Eosinophils Relative 1 %  ? Eosinophils Absolute 0.2 0.0 - 0.5 K/uL  ?  Basophils Relative 0 %  ?  Basophils Absolute 0.0 0.0 - 0.1 K/uL  ? Immature Granulocytes 1 %  ? Abs Immature Granulocytes 0.08 (H) 0.00 - 0.07 K/uL  ?TSH     Status: Abnormal  ? Collection Time: 03/13/22 12:15 AM  ?Result Value Ref Range  ? TSH 8.574 (H) 0.350 - 4.500 uIU/mL  ?Troponin I (High Sensitivity)     Status: Abnormal  ? Collection Time: 03/13/22 12:15 AM  ?Result Value Ref Range  ? Troponin I (High Sensitivity) 49 (H) <18 ng/L  ?I-Stat beta hCG blood, ED     Status: None  ? Collection Time: 03/13/22 12:39 AM  ?Result Value Ref Range  ? I-stat hCG, quantitative <5.0 <5 mIU/mL  ? Comment 3          ?Troponin I (High Sensitivity)     Status: Abnormal  ? Collection Time: 03/13/22  2:06 AM  ?Result Value Ref Range  ? Troponin I (High Sensitivity) 146 (HH) <18 ng/L  ?Urinalysis, Routine w reflex microscopic Urine, Unspecified Source     Status: Abnormal  ? Collection Time: 03/13/22  4:02 AM  ?Result Value Ref Range  ? Color, Urine YELLOW YELLOW  ? APPearance CLEAR CLEAR  ? Specific Gravity, Urine 1.012 1.005 - 1.030  ? pH 7.0 5.0 - 8.0  ? Glucose, UA NEGATIVE NEGATIVE mg/dL  ? Hgb urine dipstick NEGATIVE NEGATIVE  ? Bilirubin Urine NEGATIVE NEGATIVE  ? Ketones, ur NEGATIVE NEGATIVE mg/dL  ? Protein, ur 100 (A) NEGATIVE mg/dL  ? Nitrite NEGATIVE NEGATIVE  ? Leukocytes,Ua NEGATIVE NEGATIVE  ? RBC / HPF 0-5 0 - 5 RBC/hpf  ? WBC, UA 0-5 0 - 5 WBC/hpf  ? Bacteria, UA NONE SEEN NONE SEEN  ? Squamous Epithelial / LPF 0-5 0 - 5  ?Urine rapid drug screen (hosp performed)     Status: None  ? Collection Time: 03/13/22  4:02 AM  ?Result Value Ref Range  ? Opiates NONE DETECTED NONE DETECTED  ? Cocaine NONE DETECTED NONE DETECTED  ? Benzodiazepines NONE DETECTED NONE DETECTED  ? Amphetamines NONE DETECTED NONE DETECTED  ? Tetrahydrocannabinol NONE DETECTED NONE DETECTED  ? Barbiturates NONE DETECTED NONE DETECTED  ?Troponin I (High Sensitivity)     Status: Abnormal  ? Collection Time: 03/13/22  6:17 AM  ?Result Value Ref  Range  ? Troponin I (High Sensitivity) 133 (HH) <18 ng/L  ?Troponin I (High Sensitivity)     Status: Abnormal  ? Collection Time: 03/13/22  7:42 AM  ?Result Value Ref Range  ? Troponin I (High Sensitivity) 11

## 2022-03-13 NOTE — ED Provider Notes (Signed)
? ?[place]  ?Provider Note ? ?CSN: [id number] ?Arrival date & time: 03/13/22 0016 ? ?History ?Chief Complaint  ?Patient presents with  ? Seizures  ? ? ?Tanya Nolan is a 46 y.o. female brought to the ED by brother who dropped her off in the ambulance bay for evaluation of possible seizure. She was reportedly unresponsive and had to be pulled out of his car, but on my arrival to the room she was able to speak without difficulty. She reports she had a seizure while with a friend earlier tonight, he did 'mouth to mouth' because she wasn't breathing but then she was able to wake up and drive herself to her brother's house. It isn't clear what happened after that, whether she had another seizure or not. She has no history of seizures. She has HTN, DM, hypothryoid not on any medications at this time. She reports she has had intermittent chest pains, SOB and leg pains for 2 weeks, scheduled to see her PCP tomorrow. She had a visit to [facility] about a month ago for SI and ultimately discharged. She reports increased stress due to work, school and caring for her mother. She denies any EtOH or drug use.  ? ? ?Home Medications ?Prior to Admission medications   ?Medication Sig Start Date End Date Taking? Authorizing Provider  ?acetaminophen (TYLENOL) 500 MG tablet Take 1,000 mg by mouth every 6 (six) hours as needed for moderate pain or headache.   Yes [provider]  ?albuterol (PROAIR HFA) 108 (90 Base) MCG/ACT inhaler Inhale 1-2 puffs into the lungs every 6 (six) hours as needed for wheezing or shortness of breath. 05/20/21  Yes [name], MD  ?ARIPiprazole (ABILIFY) 5 MG tablet Take 1 tablet (5 mg total) by mouth daily. For mood control 01/09/22  Yes Eulis Canner E, NP  ?atorvastatin (LIPITOR) 80 MG tablet Take 1 tablet (80 mg total) by mouth daily. 01/18/22  Yes [name], MD  ?cetirizine (ZYRTEC) 10 MG tablet Take 1 tablet (10 mg total) by mouth  daily. ?Patient taking differently: Take 10 mg by mouth daily as needed for allergies. 04/13/21  Yes Simmons-Robinson, Makiera, MD  ?enalapril (VASOTEC) 2.5 MG tablet Take 1 tablet (2.5 mg total) by mouth daily. For high blood pressure 01/18/22  Yes [name], MD  ?ezetimibe (ZETIA) 10 MG tablet Take 1 tablet (10 mg total) by mouth daily. 01/18/22  Yes [name], MD  ?levothyroxine (SYNTHROID) 125 MCG tablet Take 1 tablet (125 mcg total) by mouth daily before breakfast. Reported on 02/23/2016 01/18/22  Yes [name], MD  ?sertraline (ZOLOFT) 100 MG tablet Take 1 tablet (100 mg total) by mouth daily. For depression 01/09/22  Yes Eulis Canner E, NP  ?traZODone (DESYREL) 50 MG tablet Take 1-2 tablets (50-100 mg total) by mouth at bedtime as needed for sleep. 01/09/22  Yes Salley Slaughter, NP  ?Accu-Chek FastClix Lancets MISC Use to check CBG TID 07/22/19   Andrena Mews T, MD  ?BD ULTRA-FINE PEN NEEDLES 29G X 12.7MM MISC USE THREE TIMES A DAY BEFORE MEALS 06/10/13   Andrena Mews T, MD  ?Blood Glucose Monitoring Suppl (ACCU-CHEK GUIDE) w/Device KIT 1 applicator by Does not apply route 3 (three) times daily. Use to check CBG TID 07/22/19   [name], MD  ?empagliflozin (JARDIANCE) 10 MG TABS tablet Take 1 tablet (10 mg total) by mouth daily. ?Patient not taking: Reported on 01/18/2022 05/18/21   Andrena Mews  T, MD  ?glucose blood (ACCU-CHEK GUIDE) test strip USE TO CHECK GBG 3 TIMES A DAY 04/13/21   Simmons-Robinson, Makiera, MD  ?hydrocortisone 2.5 % ointment Apply topically 2 (two) times daily. Limit use to one week ?Patient not taking: Reported on 03/13/2022 04/13/21   Simmons-Robinson, Riki Sheer, MD  ?naproxen (NAPROSYN) 500 MG tablet Take 1 tablet (500 mg total) by mouth 2 (two) times daily with a meal. ?Patient not taking: Reported on 03/13/2022 11/25/21   Caccavale, Sophia, PA-C  ?polyethylene glycol powder (GLYCOLAX/MIRALAX) 17 GM/SCOOP powder Take 17 g by mouth daily as needed. ?Patient not  taking: Reported on 03/13/2022 06/20/19   [name], MD  ?insulin lispro protamine-insulin lispro (HUMALOG 75/25) (75-25) 100 UNIT/ML SUSP Inject 40-50 Units into the skin 2 (two) times daily with a meal. Patient takes 40 units in the morning and 50 units at night.  Verified it is Humalog 75/25.  04/18/12  [provider]  ? ? ? ?Allergies    ?Patient has no known allergies. ? ? ?Review of Systems   ?Review of Systems ?Please see HPI for pertinent positives and negatives ? ?Physical Exam ?BP (!) 142/79   Pulse 63   Temp (!) 97.3 ?F (36.3 ?C) (Oral)   Resp 17   Ht '5\' 7"'  (1.702 m)   Wt 97.7 kg   SpO2 100%   BMI 33.73 kg/m?  ? ?Physical Exam ?Vitals and nursing note reviewed.  ?Constitutional:   ?   Appearance: Normal appearance.  ?HENT:  ?   Head: Normocephalic and atraumatic.  ?   Nose: Nose normal.  ?   Mouth/Throat:  ?   Mouth: Mucous membranes are moist.  ?Eyes:  ?   Extraocular Movements: Extraocular movements intact.  ?   Conjunctiva/sclera: Conjunctivae normal.  ?Cardiovascular:  ?   Rate and Rhythm: Normal rate.  ?Pulmonary:  ?   Effort: Pulmonary effort is normal.  ?   Breath sounds: Normal breath sounds.  ?Abdominal:  ?   General: Abdomen is flat.  ?   Palpations: Abdomen is soft.  ?   Tenderness: There is no abdominal tenderness.  ?Musculoskeletal:     ?   General: No swelling. Normal range of motion.  ?   Cervical back: Neck supple.  ?Skin: ?   General: Skin is warm and dry.  ?Neurological:  ?   General: No focal deficit present.  ?   Mental Status: She is alert and oriented to person, place, and time.  ?   Cranial Nerves: No cranial nerve deficit.  ?   Motor: No weakness.  ?   Comments: Eye fluttering, but awake and conversant  ?Psychiatric:     ?   Mood and Affect: Mood normal.  ? ? ?ED Results / Procedures / Treatments   ?EKG ?EKG Interpretation ? ?Date/Time:  Monday Mar 13 2022 03:14:40 EDT ?Ventricular Rate:  71 ?PR Interval:  149 ?QRS Duration: 89 ?QT Interval:  420 ?QTC  Calculation: 457 ?R Axis:   84 ?Text Interpretation: Sinus rhythm Normal ECG No significant change since last tracing Confirmed by Calvert Cantor [phone]) on 03/13/2022 3:17:08 AM ? ?Procedures ?Procedures ? ?Medications Ordered in the ED ?Medications  ?atorvastatin (LIPITOR) tablet 80 mg (has no administration in time range)  ?enalapril (VASOTEC) tablet 2.5 mg (has no administration in time range)  ?ezetimibe (ZETIA) tablet 10 mg (has no administration in time range)  ?ARIPiprazole (ABILIFY) tablet 5 mg (has no administration in time range)  ?sertraline (ZOLOFT) tablet 100 mg (  has no administration in time range)  ?levothyroxine (SYNTHROID) tablet 125 mcg (125 mcg Oral Given 03/13/22 8101)  ?enoxaparin (LOVENOX) injection 40 mg (has no administration in time range)  ?acetaminophen (TYLENOL) tablet 650 mg (has no administration in time range)  ?  Or  ?acetaminophen (TYLENOL) suppository 650 mg (has no administration in time range)  ?LORazepam (ATIVAN) injection 2 mg (has no administration in time range)  ?aspirin chewable tablet 324 mg (324 mg Oral Given 03/13/22 0312)  ? ? ?Initial Impression and Plan ? Patient here with reported seizure-like activity. No history of same. High suspicion for psychogenic non-epileptic seizure. Also complaining of 2 weeks of chest pain. Will check labs, EKG, monitor in the ED.  ? ?ED Course  ? ?Clinical Course as of 03/13/22 0644  ?Mon Mar 13, 2022  ?0054 CBC with mild leukocytosis. HCG is neg.  [CS]  ?0120 EtOH is neg. CMP is unremarkable.  [CS]  ?0121 I personally viewed the images from radiology studies and agree with radiologist interpretation: CXR is clear ? [CS]  ?0131 Initial Trop is elevated. Will check delta.  [CS]  ?0305 TSH is elevated, will need to be managed with PCP.  [CS]  ?0309 Repeat trop is increasing. Will give ASA and discuss with Cardiology. She has been sleeping soundly since arrival.  [CS]  ?[place] with Dr. Marcelle Smiling, Cardiology, who recommends medicine admission  and echocardiogram. Does not recommend heparin at this time. [facility] Residents paged.  [CS]  ?54 Spoke with [facility] residents who will evaluate for admission. They request Neuro consult as well.  [CS]  ?[place] with Dr. Lorrin Goodell, Neurol

## 2022-03-14 ENCOUNTER — Observation Stay (HOSPITAL_COMMUNITY): Payer: Medicaid Other

## 2022-03-14 ENCOUNTER — Other Ambulatory Visit: Payer: Self-pay | Admitting: Cardiology

## 2022-03-14 ENCOUNTER — Encounter (HOSPITAL_COMMUNITY): Payer: Self-pay | Admitting: Family Medicine

## 2022-03-14 DIAGNOSIS — R931 Abnormal findings on diagnostic imaging of heart and coronary circulation: Secondary | ICD-10-CM | POA: Diagnosis not present

## 2022-03-14 DIAGNOSIS — R55 Syncope and collapse: Secondary | ICD-10-CM

## 2022-03-14 DIAGNOSIS — R778 Other specified abnormalities of plasma proteins: Secondary | ICD-10-CM | POA: Diagnosis not present

## 2022-03-14 DIAGNOSIS — R079 Chest pain, unspecified: Secondary | ICD-10-CM | POA: Diagnosis not present

## 2022-03-14 DIAGNOSIS — I2510 Atherosclerotic heart disease of native coronary artery without angina pectoris: Secondary | ICD-10-CM | POA: Diagnosis not present

## 2022-03-14 DIAGNOSIS — R569 Unspecified convulsions: Secondary | ICD-10-CM | POA: Diagnosis not present

## 2022-03-14 LAB — BASIC METABOLIC PANEL
Anion gap: 6 (ref 5–15)
BUN: 9 mg/dL (ref 6–20)
CO2: 24 mmol/L (ref 22–32)
Calcium: 8.5 mg/dL — ABNORMAL LOW (ref 8.9–10.3)
Chloride: 107 mmol/L (ref 98–111)
Creatinine, Ser: 0.81 mg/dL (ref 0.44–1.00)
GFR, Estimated: >60 mL/min (ref >60)
Glucose, Bld: 134 mg/dL — ABNORMAL HIGH (ref 70–99)
Potassium: 4.1 mmol/L (ref 3.5–5.1)
Sodium: 137 mmol/L (ref 135–145)

## 2022-03-14 LAB — CBG MONITORING, ED: Glucose-Capillary: 143 mg/dL — ABNORMAL HIGH (ref 70–99)

## 2022-03-14 LAB — CBC
HCT: 38.5 % (ref 36.0–46.0)
Hemoglobin: 12.5 g/dL (ref 12.0–15.0)
MCH: 28.9 pg (ref 26.0–34.0)
MCHC: 32.5 g/dL (ref 30.0–36.0)
MCV: 88.9 fL (ref 80.0–100.0)
Platelets: 252 10*3/uL (ref 150–400)
RBC: 4.33 MIL/uL (ref 3.87–5.11)
RDW: 14 % (ref 11.5–15.5)
WBC: 10.9 10*3/uL — ABNORMAL HIGH (ref 4.0–10.5)
nRBC: 0 % (ref 0.0–0.2)

## 2022-03-14 LAB — LIPID PANEL
Cholesterol: 118 mg/dL (ref 0–200)
HDL: 25 mg/dL — ABNORMAL LOW (ref >40)
LDL Cholesterol: 73 mg/dL (ref 0–99)
Total CHOL/HDL Ratio: 4.7 RATIO
Triglycerides: 100 mg/dL (ref <150)
VLDL: 20 mg/dL (ref 0–40)

## 2022-03-14 MED ORDER — NITROGLYCERIN 0.4 MG SL SUBL
SUBLINGUAL_TABLET | SUBLINGUAL | Status: AC
  Filled 2022-03-14: qty 2

## 2022-03-14 MED ORDER — SODIUM CHLORIDE 0.9% FLUSH
3.0000 mL | Freq: Two times a day (BID) | INTRAVENOUS | Status: DC
  Administered 2022-03-14 – 2022-03-16 (×5): 3 mL via INTRAVENOUS

## 2022-03-14 MED ORDER — IOHEXOL 350 MG/ML SOLN
100.0000 mL | Freq: Once | INTRAVENOUS | Status: AC | PRN
  Administered 2022-03-14: 100 mL via INTRAVENOUS

## 2022-03-14 MED ORDER — BUPROPION HCL ER (XL) 150 MG PO TB24
150.0000 mg | ORAL_TABLET | Freq: Every day | ORAL | Status: DC
  Administered 2022-03-15 – 2022-03-21 (×6): 150 mg via ORAL
  Filled 2022-03-14 (×6): qty 1

## 2022-03-14 MED ORDER — METOPROLOL TARTRATE 50 MG PO TABS: 50.0000 mg | ORAL_TABLET | Freq: Once | ORAL | Status: DC
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
## 2022-03-14 NOTE — Progress Notes (Signed)
Messaged Dr. Clayton Bibles around 2120 regarding pt's elevated BP. 176/77 and recheck was 162/66. Pt has had elevated BP since she has been here and is scheduled for a heart cath tomorrow. Dr. Clayton Bibles said to just monitor her BP for now and let him know if it increases any. Will continue to monitor pt.  ?
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
## 2022-03-14 NOTE — Discharge Instructions (Addendum)
Dear Tanya Nolan, ? ?Thank you for letting us participate in your care. You were hospitalized for seizure like activity. You were evaluated by cardiology and neurology. Your brain wave test (EEG) was normal and did not show any seizures during the study.  ? ?POST-HOSPITAL & CARE INSTRUCTIONS ?** ?Go to your follow up appointments (listed below) ? ? ?DOCTOR'S APPOINTMENT   ?Future Appointments  ?Date Time Provider [place]  ?03/21/2022  1:30 PM Lattie Haw, MD [facility] [place]  ?03/23/2022  3:30 PM [place], Lunette Stands, NP [facility] None  ? ? Follow-up Information   ? ? [place]. Go on 03/21/2022.   ?Specialty: Family Medicine ?Why: At 1:30pm. Please arrive by 1:15pm. This is your hospital follow up appointment with your primary care clinic. ?Contact information: ?[address] ?[id number] mc ?[place] ?[phone] ? ?  ?  ? ?  ?  ? ?  ? ? ?Take care and be well! ? ?Family Medicine Teaching Service Inpatient Team ?[place]  ?[facility]  ?[address] [place], [place] [address] ?[phone] ? ? ?Discharge Instructions: ? ?1. You may shower, please wash incisions daily with soap and water and keep dry.  If you wish to cover wounds with dressing you may do so but please keep clean and change daily.  No tub baths or swimming until incisions have completely healed.  If your incisions become red or develop any drainage please call our office at [phone] ? ?2. No Driving until cleared by Dr. Vivi Martens office and you are no longer using narcotic pain medications ? ?3. Monitor your weight daily.. Please use the same scale and weigh at same time... If you gain 5-10 lbs in 48 hours with associated lower extremity swelling, please contact our office at [phone] ? ?4. Fever of 101.5 for at least 24 hours with no source, please contact our office at [phone] ? ?5. Activity- up as tolerated, please walk at least 3 times per day.  Avoid  strenuous activity, no lifting, pushing, or pulling with your arms over 8-10 lbs for a minimum of 6 weeks ? ?6. If any questions or concerns arise, please do not hesitate to contact our office at [phone]  ?

## 2022-03-14 NOTE — Discharge Summary (Incomplete)
Family Medicine Teaching Service ?Hospital Discharge Summary ? ?Patient name: Tanya Nolan Medical record number: [id number] ?Date of birth: [birth year] Age: 46 y.o. Gender: female ?Date of Admission: 03/13/2022  Date of Discharge: *** ?Admitting Physician: Zenia Resides, MD ? ?Primary Care Provider: [name], MD ?Consultants: None *** ? ?Indication for Hospitalization:  ?Seizures ?  ? ?Discharge Diagnoses/Problem List:  ?Principal Problem: ?  Seizure-like activity ([place]) ?  ? ?Disposition:  ?*** ?    ?Discharge Condition:  ?Stable *** ? ?Discharge Exam:  ?Temp:  [97.5 ?F (36.4 ?C)-97.9 ?F (36.6 ?C)] 97.9 ?F (36.6 ?C) (05/02 0539) ?Pulse Rate:  [56-78] 60 (05/02 0933) ?Resp:  [16-23] 16 (05/02 0933) ?BP: (98-173)/(55-82) 173/71 (05/02 0933) ?SpO2:  [98 %-100 %] 100 % (05/02 0933)  ?Physical Exam  ? ?Brief Hospital Course:  ?Assessment and Plan: ?Tanya Nolan is a 46 y.o. female presented with self reported seizure-like activity and chest pain. ?PMH is significant for HTN, DM2, hypothyroidism, HLD, MDD.  ?HD 0  ? ?Syncopal episode 2/2 cardiac vs apneic spell ?Unclear etiology, considering arrhythmia, OSA, panic attack. But low suspicion for seizure activity, especially given EEG within normal limits, no post-ictal state. Considered vasovagal syncope, however no prodrome. MRI within normal limits. got in contact with patient's brother and his wife, he stated she had a similar episode during the drive to the hospital, where she slumped over and did not respond to verbal response for about 10 minutes all of a sudden during conversation.  No rhythmic movements, convulsion, or prodromal symptoms.  Does report symptoms of snoring, waking up in the middle of the night, daytime sleepiness. ?- Neurology & cardiology consulted, appreciate assistance recommendations ?- Recommended sleep study for OSA ? ?Exertional and positional chest pain, s/p asa '325mg'$  with Elevated Troponins, down trending ?Last trops 112. ACS w/u  negative with nml EKG, no arrhythmia. Echo EF 60-65% no signs of ischemia. UDS neg, BAL <10. Does have risk factors for ACS: HTN, DM2, TSH, HLD. ?Had CP at 3am at rest that woke her up during sleep, that lasted about 5 mins and self resolved. ?- Cardiac referral by PCP ? ?TSH  ?TSH 8.574. Sporadic adherence due to busy lifestyle.  ?- Continued home Synthroid 125 mcg ? ?HTN, improving ?Sporadic adherence. ?- Continued home Enalapril 2.5 mg daily ? ?DM2, Diet controlled ?HbA1c 6.6 (01/18/2022), last CBG 134 (IP CBG goal <180).  ?- CBGs ?- A1c goal <7 ? ?HLD ?Lipid panel resulted, LDL in 70s, cholesterol 200s. No h/l of Mi or stroke.  ?- LDL goal  ?- Continued Atorvastatin 80 mg  ?- Discontinue zetia 10 mg ? ?MDD & comorbid tobacco abuse ?Sporadic adherence, has not taken home rx for last 1-2 weeks because of mood dulling side effects. Reported no improvement in mood with it, but still wants to continue zoloft. Amenable with wellbutrin for mood adjuct to zoloft and smoking cessation. D/c abilify for not helpful as adjunct and risk of future side effects. ?- Continued Zoloft 50 mg  ?- Started Wellbutrin XL 150 mg daily ?- D/C abilify '5mg'$  ? ?White count, down trending ?2/2 stress reaction and tobacco abuse. UA neg, no fever ? ?Issues for Follow Up:  ?*** ? ?Significant Procedures:  ?***  ? ?Significant Labs and Imaging:  ?Recent Labs  ?Lab 03/13/22 ?0015 03/14/22 ?0552  ?WBC 15.7* 10.9*  ?HGB 13.4 12.5  ?HCT 40.7 38.5  ?PLT 257 252  ? ?Recent Labs  ?Lab 03/13/22 ?0015 03/14/22 ?0552  ?NA 137 137  ?  K 3.7 4.1  ?CL 103 107  ?CO2 24 24  ?GLUCOSE 160* 134*  ?BUN 8 9  ?CREATININE 0.81 0.81  ?CALCIUM 8.8* 8.5*  ?ALKPHOS 59  --   ?AST 25  --   ?ALT 19  --   ?ALBUMIN 3.6  --   ? ?EKG Interpretation ? ?Date/Time:  Monday Mar 13 2022 03:14:40 EDT ?Ventricular Rate:  71 ?PR Interval:  149 ?QRS Duration: 89 ?QT Interval:  420 ?QTC Calculation: 457 ?R Axis:   84 ?Text Interpretation: Sinus rhythm Normal ECG No significant change since  last tracing Confirmed by Calvert Cantor [phone]) on 03/13/2022 3:17:08 AM ?  ?Results for orders placed or performed during the hospital encounter of 03/13/22 (from the past 24 hour(s))  ?CBG monitoring, ED     Status: Abnormal  ? Collection Time: 03/14/22  5:40 AM  ?Result Value Ref Range  ? Glucose-Capillary 143 (H) 70 - 99 mg/dL  ? Comment 1 Document in Chart   ?Basic metabolic panel     Status: Abnormal  ? Collection Time: 03/14/22  5:52 AM  ?Result Value Ref Range  ? Sodium 137 135 - 145 mmol/L  ? Potassium 4.1 3.5 - 5.1 mmol/L  ? Chloride 107 98 - 111 mmol/L  ? CO2 24 22 - 32 mmol/L  ? Glucose, Bld 134 (H) 70 - 99 mg/dL  ? BUN 9 6 - 20 mg/dL  ? Creatinine, Ser 0.81 0.44 - 1.00 mg/dL  ? Calcium 8.5 (L) 8.9 - 10.3 mg/dL  ? GFR, Estimated >60 >60 mL/min  ? Anion gap 6 5 - 15  ?CBC     Status: Abnormal  ? Collection Time: 03/14/22  5:52 AM  ?Result Value Ref Range  ? WBC 10.9 (H) 4.0 - 10.5 K/uL  ? RBC 4.33 3.87 - 5.11 MIL/uL  ? Hemoglobin 12.5 12.0 - 15.0 g/dL  ? HCT 38.5 36.0 - 46.0 %  ? MCV 88.9 80.0 - 100.0 fL  ? MCH 28.9 26.0 - 34.0 pg  ? MCHC 32.5 30.0 - 36.0 g/dL  ? RDW 14.0 11.5 - 15.5 %  ? Platelets 252 150 - 400 K/uL  ? nRBC 0.0 0.0 - 0.2 %  ?Lipid panel     Status: Abnormal  ? Collection Time: 03/14/22  5:52 AM  ?Result Value Ref Range  ? Cholesterol 118 0 - 200 mg/dL  ? Triglycerides 100 <150 mg/dL  ? HDL 25 (L) >40 mg/dL  ? Total CHOL/HDL Ratio 4.7 RATIO  ? VLDL 20 0 - 40 mg/dL  ? LDL Cholesterol 73 0 - 99 mg/dL  ?  ?MR BRAIN WO CONTRAST ? ?Result Date: 03/13/2022 ?CLINICAL DATA:  New onset seizure. EXAM: MRI HEAD WITHOUT CONTRAST TECHNIQUE: Multiplanar, multiecho pulse sequences of the brain and surrounding structures were obtained without intravenous contrast. COMPARISON:  Head CT 10/22/2021 FINDINGS: Brain: Dedicated thin section imaging through the temporal lobes demonstrates normal volume and signal of the hippocampi. There is no evidence of heterotopia or cortical dysplasia. There is no evidence  of an acute infarct, intracranial hemorrhage, mass, midline shift, or extra-axial fluid collection. The ventricles and sulci are normal. The brain is normal in signal. The cerebellar tonsils are normally positioned. The pituitary gland is upper limits of normal in size for a female of this age and grossly unchanged. Vascular: Major intracranial vascular flow voids are preserved. Skull and upper cervical spine: Unremarkable bone marrow signal. Sinuses/Orbits: Unremarkable orbits. Paranasal sinuses and mastoid air cells are clear. Other: None. IMPRESSION: Negative  head MRI. Electronically Signed   By: Logan Bores M.D.   On: 03/13/2022 20:37  ? ?DG Chest Port 1 View ? ?Result Date: 03/13/2022 ?CLINICAL DATA:  Chest pain, seizure. EXAM: PORTABLE CHEST 1 VIEW COMPARISON:  Chest x-ray 10/23/2019. FINDINGS: The heart size and mediastinal contours are within normal limits. Both lungs are clear. The visualized skeletal structures are unremarkable. IMPRESSION: No active disease. Electronically Signed   By: Ronney Asters M.D.   On: 03/13/2022 01:07  ? ?EEG adult ? ?Result Date: 03/13/2022 ?Lora Havens, MD     03/13/2022 10:51 AM Patient Name: SEE BEHARRY MRN: [id number] Epilepsy Attending: Lora Havens Referring Physician/Provider: Truddie Hidden, MD Date: 03/13/2022 Duration: 24.26 mins Patient history: 46 year old female with seizure-like activity.  EEG to evaluate for seizure. Level of alertness: Awake, asleep AEDs during EEG study: None Technical aspects: This EEG study was done with scalp electrodes positioned according to the 10-20 International system of electrode placement. Electrical activity was acquired at a sampling rate of '500Hz'$  and reviewed with a high frequency filter of '70Hz'$  and a low frequency filter of '1Hz'$ . EEG data were recorded continuously and digitally stored. Description: During awake state, no clear posterior dominant rhythm was seen.  EEG background consisted of 12 to 13 Hz low amplitude beta  activity.  Sleep was characterized by vertex waves, sleep spindles (12 to 14 Hz), maximal frontocentral region.  Hyperventilation and photic stimulation were not performed.   IMPRESSION: This study is wit

## 2022-03-14 NOTE — ED Notes (Signed)
Patient transported to floor bed, family at bedside. All belongings transported with patient. ?

## 2022-03-14 NOTE — ED Notes (Signed)
MD at bedside speaking with patient.

## 2022-03-14 NOTE — Consult Note (Signed)
NEUROLOGY CONSULTATION NOTE  ? ?Date of service: Mar 14, 2022 ?Patient Name: Tanya Nolan ?MRN:  [id number] ?DOB:  Sep 26, 1976 ?Reason for consult: "seizure-like activity" ?_ _ _   _ __   _ __ _ _  __ __   _ __   __ _ ? ?History of Present Illness  ?Tanya Nolan is a 46 y.o. 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 who presents on Sunday evening with "seizure-like activity". Patient states that she was participating in sexual intercourse with a female partner when she experienced a sudden loss of consciousness.leading up to this event she endorse some SHOB and palpitations. Additionally she has been experiencing intermittent, exertional SHOB, palpitations and left-sided chest pain for many months. Otherwise, she cannot remember any additional presyncopal symptoms prior to the event. She states that she was not aware during the event, and the next thing she remembers is waking up and talking to her partner. She endorses minimal tongue biting and urinary incontinence during the episode. I was able to speak with the patient's partner, who states that she appeared short of breath then her eyes rolled back in her head and she became unresponsive. She appeared as though she was struggling to breath, but he denies any twitching or rhythmic movement. The patient was laying down during this episodes, and he believes it may have lasted roughly 10-15 minutes. She was back to her baseline in  20-30 minutes. She endorses using cannabis regularly but otherwise denies new medications or non-prescription drug use. She denies any recent symptoms of systemic inflammation or infections. Patient states that she has a history of major depression but denies any acute exacerbations. In the past she has experienced SI but denies SI/HI currently. She is being managed on Aripiprazole and prn trazodone. She states that she discontinued these medication with her last dose on Friday. She states that she does not like the way it makes her feel.  ? 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 She endorsed chest pain, SHOB, palpitations, diaphoresis, and nausea. She does not remember all of the events of that episode but believes that they may have been associated with anxiety. She had not tongue biting or urinary incontinence during that episode.  ? ?Patient states that her father has had a seizure in the past which she believes is associate with his underling dx of scleroderma.  ?  ?On presentation, patient has had MRI without focal lesions, echo without evidence of valvular abnormalities or evidence of PHTN. Otherwise her labs have been reassuring, but does have an elevated TSH on thyroid replacement meds.  ? ? ?ROS  ?See above for ROS ? ?Past History  ? ?Past Medical History:  ?Diagnosis Date  ? Abdominal pain 03/26/2009  ? Qualifier: Diagnosis of  By: Jerline Pain MD, Anderson Malta    ? Anxiety   ? BACK STRAIN, LUMBAR 03/26/2009  ? Qualifier: Diagnosis of  By: Jerline Pain MD, Anderson Malta    ? Chalazion 10/01/2007  ? Qualifier: Diagnosis of  By: Genene Churn MD, Janett Billow    ? Chronic back  pain   ? tx with ibuprofen  ? Diabetes mellitus   ? Hypercholesteremia   ? Hyperlipidemia   ? no meds - tx with diet  ? Hypertension   ? Hypothyroidism   ? Ingrown toenail 12/25/2014  ? 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 C&B WNL  NEG PAPS after  ? MDD (major depressive  disorder), recurrent severe, without psychosis ([place]) 03/15/2020  ? Nipple discharge 10/13/2013  ? Non compliance w medication regimen 10/13/2013  ? 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 BENIGN SECRETORY 02/2007  ? Thyroid dysfunction   ? ?Past Surgical History:  ?Procedure Laterality Date  ? [place] DECOMPRESSION  2008  ? HYSTEROSCOPY X 2  2007 / 2012  ? ENDOMETRIAL POLYPS REMOVED  ? ?Family History  ?Problem Relation Age of Onset  ? Lupus Mother   ? Hypertension Mother   ? Heart disease Father   ? Gout Father   ? Lupus Father   ? Hypertension Sister   ? Breast cancer Neg Hx   ? ?Social History  ? ?Socioeconomic History  ? Marital status: Widowed  ?  Spouse name: Not on file  ? Number of children: Not on file  ? Years of education: Not on file  ? Highest education level: Not on file  ?Occupational History  ? Not on file  ?Tobacco Use  ? Smoking status: Every Day  ?  Packs/day: 1.00  ?  Years: 8.00  ?  Pack years: 8.00  ?  Types: Cigarettes  ? Smokeless tobacco: Never  ?Vaping Use  ? Vaping Use: Never used  ?Substance and Sexual Activity  ? Alcohol use: Yes  ?  Alcohol/week: 0.0 standard drinks  ?  Comment: socially - wine/liquor--Rare  ? Drug use: Yes  ?  Frequency: 7.0 times per week  ?  Types: Marijuana  ? Sexual activity: Yes  ?  Birth control/protection: None  ?  Comment: 1st intercourse 46 yo-More than 5 partners  ?Other Topics Concern  ? Not on file  ?Social History Narrative  ? Not on file  ? ?Social Determinants of Health  ? ?Financial Resource Strain: Not on file  ?Food Insecurity: Not on file  ?Transportation Needs: Not on file  ?Physical Activity: Not on file  ?Stress: Not on file  ?Social Connections: Not on file  ? ?No Known Allergies ? ?Medications  ?(Not in a hospital admission) ?  ? ?Vitals  ? ?Vitals:  ? 03/13/22 1515 03/13/22 2130 03/14/22 0200 03/14/22 0539  ?BP: 120/64 122/74 112/68 (!) 148/55  ?Pulse: 62 78 68 62  ?Resp: (!) '22  16 17  '$ ?Temp:  (!) 97.5 ?F (36.4 ?C)  97.9 ?F (36.6 ?C)  ?TempSrc:   Oral  Oral  ?SpO2: 100% 99% 100% 100%  ?Weight:      ?Height:      ?  ? ?Body mass index is 33.73 kg/m?. ? ?Physical Exam  ? ?General: Laying comfortably in bed; in no acute distress.  ?HENT: Normal oropharynx and mucosa. Normal external appearance of ears and nose.  ?Neck: Supple, no pain or tenderness  ?CV: RRR. No peripheral edema.  ?Pulmonary: Symmetric Chest rise. Normal respiratory effort. Clear bilaterally.  ?Abdomen: Soft to touch, non-tender.  ?Ext: No cyanosis, edema, or deformity, 2+ pulses bilateral upper and lower extrem ?Skin: No rash. Normal palpation of skin.   ?Musculoskeletal: Normal digits and nails by inspection. No clubbing.  ? ?Neurologic Examination  ?Mental status/Cognition: Alert, oriented to self, place, month and year,  good attention.  ?Speech/language: Fluent, comprehension intact, object naming intact, repetition intact.  ?Cranial nerves:  ? CN II Pupils equal and reactive to light, no VF deficits   ? CN III,IV,VI EOM intact, no gaze preference or deviation, no nystagmus   ? CN V normal sensation in V1, V2, and V3 segments bilaterally   ? CN VII no asymmetry, no nasolabial fold flattening   ? CN VIII normal hearing to speech   ? CN IX & X normal palatal elevation, no uvular deviation   ? CN XI 5/5 head turn and 5/5 shoulder shrug bilaterally   ? CN XII midline tongue protrusion   ? ?Motor:  ?Muscle bulk: 5/5 in upper and lower extremities, tone normal, tremor negative. There was evidence of hypothenar atrophy on the right. ? ?Reflexes: ? Right Left Comments  ?Pectoralis     ? Biceps (C5/6)     ?Brachioradialis (C5/6) 2 2   ? Triceps (C6/7)     ? Patellar (L3/4) 2 2   ? Achilles (S1)     ? Hoffman     ? Plantar 2 2   ?Jaw jerk   ? ?Sensation: ? Light touch intact  ? Pin prick   ? Temperature intact  ? Vibration   ?Proprioception   ? ?Coordination/Complex Motor:  ?- Finger to Nose intact ?- Heel to shin intact ?- Rapid alternating movement intact ?- Gait: not tested ? ?Labs  ? ?CBC:   ?Recent Labs  ?Lab 03/13/22 ?0015 03/14/22 ?0552  ?WBC 15.7* 10.9*  ?NEUTROABS 10.9*  --   ?HGB 13.4 12.5  ?HCT 40.7 38.5  ?MCV 89.3 88.9  ?PLT 257 252  ? ? ?Basic Metabolic Panel:  ?Lab Results  ?Component Valu

## 2022-03-14 NOTE — ED Notes (Signed)
Placed breakfast order ?

## 2022-03-14 NOTE — Progress Notes (Signed)
FPTS Brief Progress Note ? ?S:Went to see patient at bedside, patient sleeping comfortably. Did not disturb them.  ? ? ?O: ?BP 120/64   Pulse 62   Temp (!) 97.4 ?F (36.3 ?C) (Temporal)   Resp (!) 22   Ht '5\' 7"'$  (1.702 m)   Wt 97.7 kg   SpO2 100%   BMI 33.73 kg/m?   ? ? ?A/P: ?- Plans per day team ?- Orders reviewed. Labs for AM ordered, which was adjusted as needed.  ? ? ?Holley Bouche, MD ?03/14/2022, 1:37 AM ?PGY-1, [facility] Night Resident  ?Please page [phone] with questions.  ? ? ?

## 2022-03-14 NOTE — Progress Notes (Addendum)
Family Medicine Teaching Service ?Daily Progress Note ?Intern Pager: [phone] ? ?Patient name: Tanya Nolan Medical record number: [id number] ?Date of birth: 1975-11-30 Age: 46 y.o. Gender: female ? ?Primary Care Provider: [name], MD ?Consultants: Neurology, cardiology ?Code Status: Full Code  ? ?Pt Overview and Major Events to Date:  ?5/1: Admitted ? ?Assessment and Plan: ?NAASIA WEILBACHER is a 46 y.o. female presented with self reported seizure-like activity and chest pain. ?PMH is significant for HTN, DM2, hypothyroidism, HLD, MDD.  ?HD 0  ? ?Syncopal episode 2/2 cardiac vs apneic spell ?Unclear etiology, considering arrhythmia, OSA, panic attack. But low suspicion for seizure activity, especially given EEG within normal limits, no post-ictal state. Considered vasovagal syncope, however no prodrome. MRI within normal limits. got in contact with patient's brother and his wife, he stated she had a similar episode during the drive to the hospital, where she slumped over and did not respond to verbal response for about 10 minutes all of a sudden during conversation.  No rhythmic movements, convulsion, or prodromal symptoms.  Does report symptoms of snoring, waking up in the middle of the night, daytime sleepiness. ?- Neurology & cardiology consulted, appreciate assistance recommendations ?- Recommended sleep study for OSA ? ?Exertional and positional chest pain, s/p asa '325mg'$  with Elevated Troponins, down trending ?Last trops 112. ACS w/u negative with nml EKG, no arrhythmia. Echo EF 60-65% no signs of ischemia. UDS neg, BAL <10. Does have risk factors for ACS: HTN, DM2, TSH, HLD. ?Had CP at 3am at rest that woke her up during sleep, that lasted about 5 mins and self resolved. ?- Cardiac CT per cards ? ?TSH  ?TSH 8.574. Sporadic adherence due to busy lifestyle.  ?- Continued home Synthroid 125 mcg ? ?HTN, improving ?Sporadic adherence. ?- Continued home Enalapril 2.5 mg daily ? ?DM2, Diet controlled ?HbA1c  6.6 (01/18/2022), last CBG 134 (IP CBG goal <180).  ?- CBGs ?- A1c goal <7 ? ?HLD ?Lipid panel resulted, LDL in 70s, cholesterol 200s.  Given patient's LDL is in the 70s after she has not been taking these medications and no h/o of Mi or stroke, we will discontinue Zetia to reduce pill burden. ?- LDL goal <100 for history of diabetes ?- Continued Atorvastatin 80 mg  ?- Discontinued zetia 10 mg ? ?MDD & comorbid tobacco abuse ?Sporadic adherence, has not taken home rx for last 1-2 weeks because of mood dulling side effects. Reported no improvement in mood with it, but still wants to continue zoloft. Amenable with wellbutrin for mood adjuct to zoloft and smoking cessation. D/c abilify for not helpful as adjunct and risk of future side effects. ?- Continued Zoloft 50 mg  ?- Started Wellbutrin XL 150 mg daily ?- D/C abilify '5mg'$  ? ?White count, down trending ?2/2 stress reaction and tobacco abuse. UA neg, no fever ? ?FEN/GI:    ?PPx: enoxaparin (LOVENOX) injection 40 mg Start: 03/13/22 1000  ?Dispo:Pending PT recommendations   pending workup . ? ?Subjective:  ?Mom was at bedside. ? ?Sylvester was seen this AM resting comfortable. Reported left sided CP, dull in nature, non-radiation, at rest suddenly at around 3am, that lasted 5 mins. Pain woke up from pain.  ? ?Amenable to starting Wellbutrin for smoking cessation as well as antidepressant adjunct to Zoloft. ? ?Reported she is ready to go home after cardiology and neurology sees her. ? ?Objective: ?Temp:  [97.5 ?F (36.4 ?C)-97.9 ?F (36.6 ?C)] 97.8 ?F (36.6 ?C) (05/02 1232) ?Pulse Rate:  [60-78] 63 (05/02  1232) ?Resp:  [16-23] 18 (05/02 1232) ?BP: (98-173)/(55-82) 159/73 (05/02 1232) ?SpO2:  [98 %-100 %] 100 % (05/02 1232) ?Physical Exam ?Vitals and nursing note reviewed.  ?Constitutional:   ?   General: She is awake. She is not in acute distress. ?   Appearance: She is not ill-appearing, toxic-appearing or diaphoretic.  ?HENT:  ?   Head: Normocephalic and atraumatic.   ?Cardiovascular:  ?   Rate and Rhythm: Normal rate and regular rhythm.  ?   Heart sounds: Normal heart sounds. No murmur heard. ?  No friction rub. No gallop.  ?Pulmonary:  ?   Effort: Pulmonary effort is normal. No respiratory distress.  ?   Breath sounds: Normal breath sounds. No wheezing or rales.  ?Abdominal:  ?   General: Bowel sounds are normal.  ?   Palpations: Abdomen is soft.  ?   Tenderness: There is no abdominal tenderness. There is no guarding or rebound.  ?Musculoskeletal:  ?   Right lower leg: No edema.  ?   Left lower leg: No edema.  ?Skin: ?   General: Skin is warm and dry.  ?Neurological:  ?   Mental Status: She is alert and oriented to person, place, and time.  ?Psychiatric:     ?   Behavior: Behavior is cooperative.  ?  ? ?Laboratory: ?Recent Labs  ?Lab 03/13/22 ?0015 03/14/22 ?0552  ?WBC 15.7* 10.9*  ?HGB 13.4 12.5  ?HCT 40.7 38.5  ?PLT 257 252  ? ?Recent Labs  ?Lab 03/13/22 ?0015 03/14/22 ?0552  ?NA 137 137  ?K 3.7 4.1  ?CL 103 107  ?CO2 24 24  ?BUN 8 9  ?CREATININE 0.81 0.81  ?CALCIUM 8.8* 8.5*  ?PROT 6.8  --   ?BILITOT 0.4  --   ?ALKPHOS 59  --   ?ALT 19  --   ?AST 25  --   ?GLUCOSE 160* 134*  ? ?Results for orders placed or performed during the hospital encounter of 03/13/22 (from the past 24 hour(s))  ?CBG monitoring, ED     Status: Abnormal  ? Collection Time: 03/14/22  5:40 AM  ?Result Value Ref Range  ? Glucose-Capillary 143 (H) 70 - 99 mg/dL  ? Comment 1 Document in Chart   ?Basic metabolic panel     Status: Abnormal  ? Collection Time: 03/14/22  5:52 AM  ?Result Value Ref Range  ? Sodium 137 135 - 145 mmol/L  ? Potassium 4.1 3.5 - 5.1 mmol/L  ? Chloride 107 98 - 111 mmol/L  ? CO2 24 22 - 32 mmol/L  ? Glucose, Bld 134 (H) 70 - 99 mg/dL  ? BUN 9 6 - 20 mg/dL  ? Creatinine, Ser 0.81 0.44 - 1.00 mg/dL  ? Calcium 8.5 (L) 8.9 - 10.3 mg/dL  ? GFR, Estimated >60 >60 mL/min  ? Anion gap 6 5 - 15  ?CBC     Status: Abnormal  ? Collection Time: 03/14/22  5:52 AM  ?Result Value Ref Range  ? WBC  10.9 (H) 4.0 - 10.5 K/uL  ? RBC 4.33 3.87 - 5.11 MIL/uL  ? Hemoglobin 12.5 12.0 - 15.0 g/dL  ? HCT 38.5 36.0 - 46.0 %  ? MCV 88.9 80.0 - 100.0 fL  ? MCH 28.9 26.0 - 34.0 pg  ? MCHC 32.5 30.0 - 36.0 g/dL  ? RDW 14.0 11.5 - 15.5 %  ? Platelets 252 150 - 400 K/uL  ? nRBC 0.0 0.0 - 0.2 %  ?Lipid panel  Status: Abnormal  ? Collection Time: 03/14/22  5:52 AM  ?Result Value Ref Range  ? Cholesterol 118 0 - 200 mg/dL  ? Triglycerides 100 <150 mg/dL  ? HDL 25 (L) >40 mg/dL  ? Total CHOL/HDL Ratio 4.7 RATIO  ? VLDL 20 0 - 40 mg/dL  ? LDL Cholesterol 73 0 - 99 mg/dL  ? ? ?Imaging/Diagnostic Tests: ?MR BRAIN WO CONTRAST ? ?Result Date: 03/13/2022 ?CLINICAL DATA:  New onset seizure. EXAM: MRI HEAD WITHOUT CONTRAST TECHNIQUE: Multiplanar, multiecho pulse sequences of the brain and surrounding structures were obtained without intravenous contrast. COMPARISON:  Head CT 10/22/2021 FINDINGS: Brain: Dedicated thin section imaging through the temporal lobes demonstrates normal volume and signal of the hippocampi. There is no evidence of heterotopia or cortical dysplasia. There is no evidence of an acute infarct, intracranial hemorrhage, mass, midline shift, or extra-axial fluid collection. The ventricles and sulci are normal. The brain is normal in signal. The cerebellar tonsils are normally positioned. The pituitary gland is upper limits of normal in size for a female of this age and grossly unchanged. Vascular: Major intracranial vascular flow voids are preserved. Skull and upper cervical spine: Unremarkable bone marrow signal. Sinuses/Orbits: Unremarkable orbits. Paranasal sinuses and mastoid air cells are clear. Other: None. IMPRESSION: Negative head MRI. Electronically Signed   By: Logan Bores M.D.   On: 03/13/2022 20:37    ? ?Merrily Brittle, DO ?03/14/2022, 12:54 PM ?PGY-1, [place] ?[place] Intern pager: [phone], text pages welcome  ?

## 2022-03-14 NOTE — Consult Note (Addendum)
?Cardiology Consultation: => WITH ADDENDUM!!  ? ?Patient ID: Tanya Nolan ?MRN: [id number]; DOB: 1976-07-05 ? ?Admit date: 03/13/2022 ?Date of Consult: 03/14/2022 ? ?PCP:  [name], MD ?  ?[facility] Providers ?Cardiologist:  None    ? ? ?Patient Profile:  ? ?Tanya Nolan is a 46 y.o. female with a hx of tobacco abuse, chronic back pain, HLD, HTN, hypothyroidism, polycystic ovarian syndrome who is being seen 03/14/2022 for the evaluation of chest pain at the request of Dr. Andria Frames. ? ?History of Present Illness:  ? ?Tanya Nolan is a 46 year old female with above medical history. Per chart review, patient does not have any cardiac history and has never been seen by a cardiologist.  ? ?Patient presented to the ED on 03/13/22 with complaints of going unresponsive, having seizure-like activity, and chest pain. Patient was having intercourse with a friend, when she had an episode of chest pain and had a "seizure". Patient did not remember the event, but her friend gave her mouth to mouth. Associated with urinary incontinence and tongue biting. Patient also reports that she has had intermittent chest pain and dyspnea on exertion for the past month.  ? ?Labs in the ED showed hsTn 146>>133>>112. UDS negative.  ?CXR showed no active disease. EKG showed sinus rhythm and was an overall normal EKG. MRI head negative, EEG negative  ? ?On interview, patient reports that she has had chest pain and dyspnea on exertion for the past month. Pain is described as sharp, often relieved by rest. Does not radiate into her neck or down her arm. Also has had a few episodes of chest pain when laying flat on her back. Has palpitations occasionally. When she had her syncopal episode, patient experienced chest pain and palpitations before she passed out. Patient reports ongoing tobacco use. Is treated for HTN and HLD, but does not have any cardiac history. Several family members have had CAD  ? ? ?Past Medical History:  ?Diagnosis Date  ?  Abdominal pain 03/26/2009  ? Qualifier: Diagnosis of  By: Jerline Pain MD, Anderson Malta    ? Anxiety   ? BACK STRAIN, LUMBAR 03/26/2009  ? Qualifier: Diagnosis of  By: Jerline Pain MD, Anderson Malta    ? Chalazion 10/01/2007  ? Qualifier: Diagnosis of  By: Genene Churn MD, Janett Billow    ? Chronic back pain   ? tx with ibuprofen  ? Diabetes mellitus   ? Hypercholesteremia   ? Hyperlipidemia   ? no meds - tx with diet  ? Hypertension   ? Hypothyroidism   ? Ingrown toenail 12/25/2014  ? 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 C&B WNL  NEG PAPS after  ? MDD (major depressive disorder), recurrent severe, without psychosis ([place]) 03/15/2020  ? Nipple discharge 10/13/2013  ? Nodule of skin of left foot 04/15/2021  ? Non compliance w medication regimen 10/13/2013  ? 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 BENIGN SECRETORY 02/2007  ? Thyroid dysfunction   ? ? ?Past Surgical History:  ?Procedure Laterality Date  ? [place] DECOMPRESSION  2008  ? HYSTEROSCOPY X 2  2007 / 2012  ? ENDOMETRIAL POLYPS REMOVED  ?  ? ?Home Medications:  ?Prior to Admission medications   ?Medication Sig Start Date End Date Taking? Authorizing Provider  ?acetaminophen (TYLENOL) 500 MG tablet Take 1,000 mg by mouth every 6 (six) hours as needed for moderate pain or headache.   Yes [provider]  ?albuterol (PROAIR HFA) 108 (90 Base) MCG/ACT inhaler Inhale 1-2  puffs into the lungs every 6 (six) hours as needed for wheezing or shortness of breath. 05/20/21  Yes [name], MD  ?ARIPiprazole (ABILIFY) 5 MG tablet Take 1 tablet (5 mg total) by mouth daily. For mood control 01/09/22  Yes Eulis Canner E, NP  ?atorvastatin (LIPITOR) 80 MG tablet Take 1 tablet (80 mg total) by mouth daily. 01/18/22  Yes [name], MD  ?cetirizine (ZYRTEC) 10 MG tablet Take 1 tablet (10 mg total) by mouth daily. ?Patient taking differently: Take 10 mg by mouth daily as needed for allergies. 04/13/21  Yes Simmons-Robinson, Makiera, MD  ?enalapril (VASOTEC) 2.5 MG tablet  Take 1 tablet (2.5 mg total) by mouth daily. For high blood pressure 01/18/22  Yes [name], MD  ?ezetimibe (ZETIA) 10 MG tablet Take 1 tablet (10 mg total) by mouth daily. 01/18/22  Yes [name], MD  ?levothyroxine (SYNTHROID) 125 MCG tablet Take 1 tablet (125 mcg total) by mouth daily before breakfast. Reported on 02/23/2016 01/18/22  Yes [name], MD  ?sertraline (ZOLOFT) 100 MG tablet Take 1 tablet (100 mg total) by mouth daily. For depression 01/09/22  Yes Eulis Canner E, NP  ?traZODone (DESYREL) 50 MG tablet Take 1-2 tablets (50-100 mg total) by mouth at bedtime as needed for sleep. 01/09/22  Yes Salley Slaughter, NP  ?Accu-Chek FastClix Lancets MISC Use to check CBG TID 07/22/19   Andrena Mews T, MD  ?BD ULTRA-FINE PEN NEEDLES 29G X 12.7MM MISC USE THREE TIMES A DAY BEFORE MEALS 06/10/13   Andrena Mews T, MD  ?Blood Glucose Monitoring Suppl (ACCU-CHEK GUIDE) w/Device KIT 1 applicator by Does not apply route 3 (three) times daily. Use to check CBG TID 07/22/19   [name], MD  ?empagliflozin (JARDIANCE) 10 MG TABS tablet Take 1 tablet (10 mg total) by mouth daily. ?Patient not taking: Reported on 01/18/2022 05/18/21   Andrena Mews T, MD  ?glucose blood (ACCU-CHEK GUIDE) test strip USE TO CHECK GBG 3 TIMES A DAY 04/13/21   Simmons-Robinson, Makiera, MD  ?hydrocortisone 2.5 % ointment Apply topically 2 (two) times daily. Limit use to one week ?Patient not taking: Reported on 03/13/2022 04/13/21   Simmons-Robinson, Riki Sheer, MD  ?naproxen (NAPROSYN) 500 MG tablet Take 1 tablet (500 mg total) by mouth 2 (two) times daily with a meal. ?Patient not taking: Reported on 03/13/2022 11/25/21   Caccavale, Sophia, PA-C  ?polyethylene glycol powder (GLYCOLAX/MIRALAX) 17 GM/SCOOP powder Take 17 g by mouth daily as needed. ?Patient not taking: Reported on 03/13/2022 06/20/19   [name], MD  ?insulin lispro protamine-insulin lispro (HUMALOG 75/25) (75-25) 100 UNIT/ML SUSP Inject 40-50 Units into  the skin 2 (two) times daily with a meal. Patient takes 40 units in the morning and 50 units at night.  Verified it is Humalog 75/25.  04/18/12  [provider]  ? ? ?Inpatient Medications: ?Scheduled Meds: ? ARIPiprazole  5 mg Oral Daily  ? atorvastatin  80 mg Oral Daily  ? enalapril  2.5 mg Oral Daily  ? enoxaparin (LOVENOX) injection  40 mg Subcutaneous Q24H  ? ezetimibe  10 mg Oral Daily  ? levothyroxine  125 mcg Oral QAC breakfast  ? sertraline  50 mg Oral Daily  ? ?Continuous Infusions: ? ?PRN Meds: ?acetaminophen **OR** acetaminophen, LORazepam ? ?Allergies:   No Known Allergies ? ?Social History:   ?Social History  ? ?Socioeconomic History  ? Marital status: Widowed  ?  Spouse name: Not on file  ?  Number of children: Not on file  ? Years of education: Not on file  ? Highest education level: Not on file  ?Occupational History  ? Not on file  ?Tobacco Use  ? Smoking status: Every Day  ?  Packs/day: 1.00  ?  Years: 8.00  ?  Pack years: 8.00  ?  Types: Cigarettes  ? Smokeless tobacco: Never  ?Vaping Use  ? Vaping Use: Never used  ?Substance and Sexual Activity  ? Alcohol use: Yes  ?  Alcohol/week: 0.0 standard drinks  ?  Comment: socially - wine/liquor--Rare  ? Drug use: Yes  ?  Frequency: 7.0 times per week  ?  Types: Marijuana  ? Sexual activity: Yes  ?  Birth control/protection: None  ?  Comment: 1st intercourse 46 yo-More than 5 partners  ?Other Topics Concern  ? Not on file  ?Social History Narrative  ? Not on file  ? ?Social Determinants of Health  ? ?Financial Resource Strain: Not on file  ?Food Insecurity: Not on file  ?Transportation Needs: Not on file  ?Physical Activity: Not on file  ?Stress: Not on file  ?Social Connections: Not on file  ?Intimate Partner Violence: Not on file  ?  ?Family History:   ? ?Family History  ?Problem Relation Age of Onset  ? Lupus Mother   ? Hypertension Mother   ? Heart disease Father   ? Gout Father   ? Lupus Father   ? Hypertension Sister   ? Breast cancer Neg Hx    ?  ? ?ROS:  ?Please see the history of present illness.  ? ?All other ROS reviewed and negative.    ? ?Physical Exam/Data:  ? ?Vitals:  ? 03/14/22 0539 03/14/22 0933 03/14/22 1232 03/14/22 1304  ?BP: Marland Kitchen)

## 2022-03-14 NOTE — ED Notes (Signed)
Patient denies any chest pain at this time.

## 2022-03-15 ENCOUNTER — Observation Stay (HOSPITAL_COMMUNITY)
Admit: 2022-03-15 | Discharge: 2022-03-15 | Disposition: A | Discharge status: Home / Self Care | Payer: Medicaid Other | Attending: Cardiology | Admitting: Cardiology

## 2022-03-15 ENCOUNTER — Encounter (HOSPITAL_COMMUNITY)
Admission: EM | Disposition: A | Discharge status: Home / Self Care | Payer: Self-pay | Source: Home / Self Care | Attending: Family Medicine

## 2022-03-15 DIAGNOSIS — E1159 Type 2 diabetes mellitus with other circulatory complications: Secondary | ICD-10-CM | POA: Diagnosis not present

## 2022-03-15 DIAGNOSIS — E119 Type 2 diabetes mellitus without complications: Secondary | ICD-10-CM | POA: Diagnosis not present

## 2022-03-15 DIAGNOSIS — I119 Hypertensive heart disease without heart failure: Secondary | ICD-10-CM | POA: Diagnosis present

## 2022-03-15 DIAGNOSIS — I2510 Atherosclerotic heart disease of native coronary artery without angina pectoris: Secondary | ICD-10-CM

## 2022-03-15 DIAGNOSIS — Z6833 Body mass index (BMI) 33.0-33.9, adult: Secondary | ICD-10-CM | POA: Diagnosis not present

## 2022-03-15 DIAGNOSIS — F17210 Nicotine dependence, cigarettes, uncomplicated: Secondary | ICD-10-CM | POA: Diagnosis present

## 2022-03-15 DIAGNOSIS — I25110 Atherosclerotic heart disease of native coronary artery with unstable angina pectoris: Secondary | ICD-10-CM | POA: Diagnosis not present

## 2022-03-15 DIAGNOSIS — R931 Abnormal findings on diagnostic imaging of heart and coronary circulation: Secondary | ICD-10-CM

## 2022-03-15 DIAGNOSIS — E785 Hyperlipidemia, unspecified: Secondary | ICD-10-CM | POA: Diagnosis not present

## 2022-03-15 DIAGNOSIS — E282 Polycystic ovarian syndrome: Secondary | ICD-10-CM | POA: Diagnosis present

## 2022-03-15 DIAGNOSIS — F419 Anxiety disorder, unspecified: Secondary | ICD-10-CM | POA: Diagnosis present

## 2022-03-15 DIAGNOSIS — F329 Major depressive disorder, single episode, unspecified: Secondary | ICD-10-CM | POA: Diagnosis present

## 2022-03-15 DIAGNOSIS — E7800 Pure hypercholesterolemia, unspecified: Secondary | ICD-10-CM | POA: Diagnosis present

## 2022-03-15 DIAGNOSIS — E039 Hypothyroidism, unspecified: Secondary | ICD-10-CM | POA: Diagnosis present

## 2022-03-15 DIAGNOSIS — D62 Acute posthemorrhagic anemia: Secondary | ICD-10-CM | POA: Diagnosis not present

## 2022-03-15 DIAGNOSIS — I214 Non-ST elevation (NSTEMI) myocardial infarction: Secondary | ICD-10-CM | POA: Diagnosis present

## 2022-03-15 DIAGNOSIS — I10 Essential (primary) hypertension: Secondary | ICD-10-CM | POA: Diagnosis not present

## 2022-03-15 DIAGNOSIS — G8929 Other chronic pain: Secondary | ICD-10-CM | POA: Diagnosis present

## 2022-03-15 DIAGNOSIS — Z818 Family history of other mental and behavioral disorders: Secondary | ICD-10-CM | POA: Diagnosis not present

## 2022-03-15 DIAGNOSIS — Z79899 Other long term (current) drug therapy: Secondary | ICD-10-CM | POA: Diagnosis not present

## 2022-03-15 DIAGNOSIS — R778 Other specified abnormalities of plasma proteins: Secondary | ICD-10-CM | POA: Diagnosis present

## 2022-03-15 DIAGNOSIS — R569 Unspecified convulsions: Secondary | ICD-10-CM | POA: Diagnosis present

## 2022-03-15 DIAGNOSIS — E669 Obesity, unspecified: Secondary | ICD-10-CM | POA: Diagnosis present

## 2022-03-15 DIAGNOSIS — Z01810 Encounter for preprocedural cardiovascular examination: Secondary | ICD-10-CM | POA: Diagnosis not present

## 2022-03-15 DIAGNOSIS — Z720 Tobacco use: Secondary | ICD-10-CM | POA: Diagnosis not present

## 2022-03-15 DIAGNOSIS — I240 Acute coronary thrombosis not resulting in myocardial infarction: Secondary | ICD-10-CM | POA: Diagnosis not present

## 2022-03-15 DIAGNOSIS — E1169 Type 2 diabetes mellitus with other specified complication: Secondary | ICD-10-CM

## 2022-03-15 DIAGNOSIS — F439 Reaction to severe stress, unspecified: Secondary | ICD-10-CM | POA: Diagnosis present

## 2022-03-15 DIAGNOSIS — Z832 Family history of diseases of the blood and blood-forming organs and certain disorders involving the immune mechanism: Secondary | ICD-10-CM | POA: Diagnosis not present

## 2022-03-15 DIAGNOSIS — I25119 Atherosclerotic heart disease of native coronary artery with unspecified angina pectoris: Secondary | ICD-10-CM | POA: Diagnosis present

## 2022-03-15 DIAGNOSIS — Z79890 Hormone replacement therapy (postmenopausal): Secondary | ICD-10-CM | POA: Diagnosis not present

## 2022-03-15 DIAGNOSIS — Z8249 Family history of ischemic heart disease and other diseases of the circulatory system: Secondary | ICD-10-CM | POA: Diagnosis not present

## 2022-03-15 DIAGNOSIS — Z833 Family history of diabetes mellitus: Secondary | ICD-10-CM | POA: Diagnosis not present

## 2022-03-15 DIAGNOSIS — R32 Unspecified urinary incontinence: Secondary | ICD-10-CM | POA: Diagnosis present

## 2022-03-15 DIAGNOSIS — F1290 Cannabis use, unspecified, uncomplicated: Secondary | ICD-10-CM | POA: Diagnosis present

## 2022-03-15 DIAGNOSIS — I25118 Atherosclerotic heart disease of native coronary artery with other forms of angina pectoris: Secondary | ICD-10-CM

## 2022-03-15 HISTORY — PX: LEFT HEART CATH AND CORONARY ANGIOGRAPHY: CATH118249

## 2022-03-15 LAB — BASIC METABOLIC PANEL
Anion gap: 8 (ref 5–15)
BUN: 8 mg/dL (ref 6–20)
CO2: 25 mmol/L (ref 22–32)
Calcium: 9 mg/dL (ref 8.9–10.3)
Chloride: 105 mmol/L (ref 98–111)
Creatinine, Ser: 0.84 mg/dL (ref 0.44–1.00)
GFR, Estimated: >60 mL/min (ref >60)
Glucose, Bld: 138 mg/dL — ABNORMAL HIGH (ref 70–99)
Potassium: 4 mmol/L (ref 3.5–5.1)
Sodium: 138 mmol/L (ref 135–145)

## 2022-03-15 LAB — GLUCOSE, CAPILLARY
Glucose-Capillary: 163 mg/dL — ABNORMAL HIGH (ref 70–99)
Glucose-Capillary: 127 mg/dL — ABNORMAL HIGH (ref 70–99)
Glucose-Capillary: 182 mg/dL — ABNORMAL HIGH (ref 70–99)
Glucose-Capillary: 99 mg/dL (ref 70–99)
Glucose-Capillary: 175 mg/dL — ABNORMAL HIGH (ref 70–99)

## 2022-03-15 SURGERY — LEFT HEART CATH AND CORONARY ANGIOGRAPHY
Anesthesia: LOCAL

## 2022-03-15 MED ORDER — SODIUM CHLORIDE 0.9 % WEIGHT BASED INFUSION: 1.0000 mL/kg/h | INTRAVENOUS | Status: DC

## 2022-03-15 MED ORDER — ASPIRIN 81 MG PO CHEW: 81.0000 mg | CHEWABLE_TABLET | ORAL | Status: DC

## 2022-03-15 MED ORDER — HYDRALAZINE HCL 20 MG/ML IJ SOLN
INTRAMUSCULAR | Status: DC | PRN
  Administered 2022-03-15: 10 mg via INTRAVENOUS

## 2022-03-15 MED ORDER — HEPARIN (PORCINE) IN NACL 1000-0.9 UT/500ML-% IV SOLN
INTRAVENOUS | Status: AC
  Filled 2022-03-15: qty 1000

## 2022-03-15 MED ORDER — FENTANYL CITRATE (PF) 100 MCG/2ML IJ SOLN
INTRAMUSCULAR | Status: DC | PRN
  Administered 2022-03-15: 50 ug via INTRAVENOUS

## 2022-03-15 MED ORDER — VERAPAMIL HCL 2.5 MG/ML IV SOLN
INTRAVENOUS | Status: AC
  Filled 2022-03-15: qty 2

## 2022-03-15 MED ORDER — SODIUM CHLORIDE 0.9% FLUSH
3.0000 mL | Freq: Two times a day (BID) | INTRAVENOUS | Status: DC
  Administered 2022-03-16: 3 mL via INTRAVENOUS

## 2022-03-15 MED ORDER — FENTANYL CITRATE (PF) 100 MCG/2ML IJ SOLN
INTRAMUSCULAR | Status: AC
Start: 2022-03-15 — End: ?
  Filled 2022-03-15: qty 2

## 2022-03-15 MED ORDER — ASPIRIN 81 MG PO CHEW
81.0000 mg | CHEWABLE_TABLET | ORAL | Status: AC
  Administered 2022-03-15: 81 mg via ORAL
  Filled 2022-03-15: qty 1

## 2022-03-15 MED ORDER — LIDOCAINE HCL (PF) 1 % IJ SOLN
INTRAMUSCULAR | Status: AC
  Filled 2022-03-15: qty 30

## 2022-03-15 MED ORDER — LABETALOL HCL 5 MG/ML IV SOLN
10.0000 mg | INTRAVENOUS | Status: AC | PRN
  Administered 2022-03-15: 10 mg via INTRAVENOUS
  Filled 2022-03-15: qty 4

## 2022-03-15 MED ORDER — HEPARIN SODIUM (PORCINE) 1000 UNIT/ML IJ SOLN
INTRAMUSCULAR | Status: DC | PRN
  Administered 2022-03-15: 5000 [IU] via INTRAVENOUS

## 2022-03-15 MED ORDER — ASPIRIN 81 MG PO CHEW
81.0000 mg | CHEWABLE_TABLET | Freq: Every day | ORAL | Status: DC
  Administered 2022-03-16: 81 mg via ORAL
  Filled 2022-03-15: qty 1

## 2022-03-15 MED ORDER — SODIUM CHLORIDE 0.9% FLUSH: 3.0000 mL | INTRAVENOUS | Status: DC | PRN

## 2022-03-15 MED ORDER — HYDRALAZINE HCL 20 MG/ML IJ SOLN: 10.0000 mg | INTRAMUSCULAR | Status: AC | PRN

## 2022-03-15 MED ORDER — SODIUM CHLORIDE 0.9 % IV SOLN: INTRAVENOUS | Status: AC

## 2022-03-15 MED ORDER — SERTRALINE HCL 100 MG PO TABS
100.0000 mg | ORAL_TABLET | Freq: Every day | ORAL | Status: DC
  Administered 2022-03-16 – 2022-03-21 (×5): 100 mg via ORAL
  Filled 2022-03-15 (×5): qty 1

## 2022-03-15 MED ORDER — HEPARIN (PORCINE) IN NACL 1000-0.9 UT/500ML-% IV SOLN
INTRAVENOUS | Status: DC | PRN
  Administered 2022-03-15 (×2): 500 mL

## 2022-03-15 MED ORDER — MIDAZOLAM HCL 2 MG/2ML IJ SOLN
INTRAMUSCULAR | Status: DC | PRN
  Administered 2022-03-15: 2 mg via INTRAVENOUS

## 2022-03-15 MED ORDER — HEPARIN SODIUM (PORCINE) 1000 UNIT/ML IJ SOLN
INTRAMUSCULAR | Status: AC
  Filled 2022-03-15: qty 10

## 2022-03-15 MED ORDER — SODIUM CHLORIDE 0.9 % IV SOLN: 250.0000 mL | INTRAVENOUS | Status: DC | PRN

## 2022-03-15 MED ORDER — LIDOCAINE HCL (PF) 1 % IJ SOLN
INTRAMUSCULAR | Status: DC | PRN
  Administered 2022-03-15: 10 mL
  Administered 2022-03-15: 2 mL

## 2022-03-15 MED ORDER — ENOXAPARIN SODIUM 40 MG/0.4ML IJ SOSY
40.0000 mg | PREFILLED_SYRINGE | INTRAMUSCULAR | Status: DC
  Administered 2022-03-16: 40 mg via SUBCUTANEOUS
  Filled 2022-03-15: qty 0.4

## 2022-03-15 MED ORDER — IOHEXOL 350 MG/ML SOLN
INTRAVENOUS | Status: DC | PRN
  Administered 2022-03-15: 70 mL

## 2022-03-15 MED ORDER — SODIUM CHLORIDE 0.9 % WEIGHT BASED INFUSION
3.0000 mL/kg/h | INTRAVENOUS | Status: DC
  Administered 2022-03-15: 3 mL/kg/h via INTRAVENOUS

## 2022-03-15 MED ORDER — VERAPAMIL HCL 2.5 MG/ML IV SOLN
INTRAVENOUS | Status: DC | PRN
  Administered 2022-03-15 (×2): 10 mL via INTRA_ARTERIAL

## 2022-03-15 MED ORDER — SODIUM CHLORIDE 0.9 % WEIGHT BASED INFUSION: 3.0000 mL/kg/h | INTRAVENOUS | Status: DC

## 2022-03-15 MED ORDER — MIDAZOLAM HCL 2 MG/2ML IJ SOLN
INTRAMUSCULAR | Status: AC
  Filled 2022-03-15: qty 2

## 2022-03-15 MED ORDER — HYDRALAZINE HCL 20 MG/ML IJ SOLN
INTRAMUSCULAR | Status: AC
  Filled 2022-03-15: qty 1

## 2022-03-15 SURGICAL SUPPLY — 15 items
CATH 5FR JL3.5 JR4 ANG PIG MP (CATHETERS) ×1 IMPLANT
CATH 5FR JL4 DIAGNOSTIC (CATHETERS) ×1 IMPLANT
CLOSURE MYNX CONTROL 5F (Vascular Products) ×1 IMPLANT
DEVICE RAD COMP TR BAND LRG (VASCULAR PRODUCTS) ×1 IMPLANT
GLIDESHEATH SLEND SS 6F .021 (SHEATH) ×1 IMPLANT
GUIDEWIRE INQWIRE 1.5J.035X260 (WIRE) IMPLANT
INQWIRE 1.5J .035X260CM (WIRE) ×2
KIT HEART LEFT (KITS) ×3 IMPLANT
KIT MICROPUNCTURE NIT STIFF (SHEATH) ×1 IMPLANT
PACK CARDIAC CATHETERIZATION (CUSTOM PROCEDURE TRAY) ×3 IMPLANT
SHEATH PINNACLE 5F 10CM (SHEATH) ×1 IMPLANT
SHEATH PROBE COVER 6X72 (BAG) ×1 IMPLANT
TRANSDUCER W/STOPCOCK (MISCELLANEOUS) ×3 IMPLANT
TUBING CIL FLEX 10 FLL-RA (TUBING) ×3 IMPLANT
WIRE EMERALD 3MM-J .035X150CM (WIRE) ×1 IMPLANT

## 2022-03-15 NOTE — Progress Notes (Addendum)
? ?Progress Note ? ?Patient Name: Tanya Nolan ?Date of Encounter: 03/15/2022 ? ?[facility] Cardiologist: None  ? ?Subjective  ? ?No chest pain this morning. Planned for cardiac cath today.  ? ?Inpatient Medications  ?  ?Scheduled Meds: ? atorvastatin  80 mg Oral Daily  ? buPROPion  150 mg Oral Daily  ? enalapril  2.5 mg Oral Daily  ? enoxaparin (LOVENOX) injection  40 mg Subcutaneous Q24H  ? levothyroxine  125 mcg Oral QAC breakfast  ? metoprolol tartrate  50 mg Oral Once  ? sertraline  50 mg Oral Daily  ? sodium chloride flush  3 mL Intravenous Q12H  ? ?Continuous Infusions: ? sodium chloride    ? sodium chloride    ? ?PRN Meds: ?sodium chloride, acetaminophen **OR** acetaminophen, LORazepam, sodium chloride flush  ? ?Vital Signs  ?  ?Vitals:  ? 03/15/22 0041 03/15/22 0105 03/15/22 5830 03/15/22 9407  ?BP: (!) 167/70  (!) 143/59 (!) 158/96  ?Pulse: 67  64 66  ?Resp: 18  16   ?Temp: 98.5 ?F (36.9 ?C)  98 ?F (36.7 ?C)   ?TempSrc: Oral  Oral   ?SpO2: 99%  97%   ?Weight:  97.5 kg    ?Height:      ? ? ?Intake/Output Summary (Last 24 hours) at 03/15/2022 1026 ?Last data filed at 03/14/2022 1800 ?Gross per 24 hour  ?Intake 600 ml  ?Output --  ?Net 600 ml  ? ? ?  03/15/2022  ?  1:05 AM 03/13/2022  ? 12:20 AM 01/18/2022  ? 10:52 AM  ?Last 3 Weights  ?Weight (lbs) 214 lb 15.2 oz 215 lb 6.2 oz 215 lb 6.4 oz  ?Weight (kg) 97.5 kg 97.7 kg 97.705 kg  ?   ? ?Telemetry  ?  ?Sinus Rhythm - Personally Reviewed ? ?ECG  ?  ?No new tracing ? ?Physical Exam  ? ?GEN: No acute distress.   ?Neck: No JVD ?Cardiac: RRR, no murmurs, rubs, or gallops.  ?Respiratory: Clear to auscultation bilaterally. ?GI: Soft, nontender, non-distended  ?MS: No edema; No deformity. ?Neuro:  Nonfocal  ?Psych: Normal affect  ? ?Labs  ?  ?High Sensitivity Troponin:   ?Recent Labs  ?Lab 03/13/22 ?0015 03/13/22 ?0206 03/13/22 ?6808 03/13/22 ?8110  ?TROPONINIHS 49* 146* 133* 112*  ?   ?Chemistry ?Recent Labs  ?Lab 03/13/22 ?0015 03/14/22 ?3159 03/15/22 ?0448  ?NA 137 137  138  ?K 3.7 4.1 4.0  ?CL 103 107 105  ?CO2 '24 24 25  '$ ?GLUCOSE 160* 134* 138*  ?BUN '8 9 8  '$ ?CREATININE 0.81 0.81 0.84  ?CALCIUM 8.8* 8.5* 9.0  ?PROT 6.8  --   --   ?ALBUMIN 3.6  --   --   ?AST 25  --   --   ?ALT 19  --   --   ?ALKPHOS 59  --   --   ?BILITOT 0.4  --   --   ?GFRNONAA >60 >60 >60  ?ANIONGAP '10 6 8  '$ ?  ?Lipids  ?Recent Labs  ?Lab 03/14/22 ?0552  ?CHOL 118  ?TRIG 100  ?HDL 25*  ?[place] 73  ?CHOLHDL 4.7  ?  ?Hematology ?Recent Labs  ?Lab 03/13/22 ?0015 03/14/22 ?0552  ?WBC 15.7* 10.9*  ?RBC 4.56 4.33  ?HGB 13.4 12.5  ?HCT 40.7 38.5  ?MCV 89.3 88.9  ?MCH 29.4 28.9  ?MCHC 32.9 32.5  ?RDW 14.2 14.0  ?PLT 257 252  ? ?Thyroid  ?Recent Labs  ?Lab 03/13/22 ?0015  ?TSH 8.574*  ?  ?  BNPNo results for input(s): BNP, PROBNP in the last 168 hours.  ?DDimer No results for input(s): DDIMER in the last 168 hours.  ? ?Radiology  ?  ?MR BRAIN WO CONTRAST ? ?Result Date: 03/13/2022 ?CLINICAL DATA:  New onset seizure. EXAM: MRI HEAD WITHOUT CONTRAST TECHNIQUE: Multiplanar, multiecho pulse sequences of the brain and surrounding structures were obtained without intravenous contrast. COMPARISON:  Head CT 10/22/2021 FINDINGS: Brain: Dedicated thin section imaging through the temporal lobes demonstrates normal volume and signal of the hippocampi. There is no evidence of heterotopia or cortical dysplasia. There is no evidence of an acute infarct, intracranial hemorrhage, mass, midline shift, or extra-axial fluid collection. The ventricles and sulci are normal. The brain is normal in signal. The cerebellar tonsils are normally positioned. The pituitary gland is upper limits of normal in size for a female of this age and grossly unchanged. Vascular: Major intracranial vascular flow voids are preserved. Skull and upper cervical spine: Unremarkable bone marrow signal. Sinuses/Orbits: Unremarkable orbits. Paranasal sinuses and mastoid air cells are clear. Other: None. IMPRESSION: Negative head MRI. Electronically Signed   By: Logan Bores M.D.   On: 03/13/2022 20:37  ? ?CT CORONARY MORPH W/CTA COR W/SCORE W/CA W/CM &/OR WO/CM ? ?Addendum Date: 03/15/2022   ?ADDENDUM REPORT: 03/15/2022 10:11 EXAM: OVER-READ INTERPRETATION  CT CHEST The following report is an over-read performed by radiologist Dr. Maudry Diego [facility] on 03/15/2022. This over-read does not include interpretation of cardiac or coronary anatomy or pathology. The Cardiac/Coronary CTA interpretation by the cardiologist is attached. COMPARISON:  None. FINDINGS: Vascular: Normal heart size. No pericardial effusion. Normal caliber thoracic aorta with mild noncalcified plaque. No suspicious filling defects of the central pulmonary arteries. Mediastinum/Nodes: Esophagus is unremarkable. No pathologically enlarged lymph nodes seen in the chest. Lungs/Pleura: Central airways are patent. Bibasilar atelectasis. No consolidation, pleural effusion or pneumothorax. Upper Abdomen: No acute abnormality. Musculoskeletal: No chest wall mass or suspicious bone lesions identified. IMPRESSION: 1. No acute extracardiac abnormality. 2.  Aortic Atherosclerosis (ICD10-I70.0). Electronically Signed   By: Yetta Glassman M.D.   On: 03/15/2022 10:11  ? ?Result Date: 03/15/2022 ?CLINICAL DATA:  This is a 46 year old female with chest pain. EXAM: Cardiac/Coronary  CTA TECHNIQUE: The patient was scanned on a [employer]. FINDINGS: A 100 kV prospective scan was triggered in the descending thoracic aorta at 111 HU's. Axial non-contrast 3 mm slices were carried out through the heart. The data set was analyzed on a dedicated work station and scored using the [place]. Gantry rotation speed was 250 msecs and collimation was .6 mm. No beta blockade and 0.8 mg of sl NTG was given. The 3D data set was reconstructed in 5% intervals of the 67-82 % of the R-R cycle. Diastolic phases were analyzed on a dedicated work station using MPR, MIP and VRT modes. The patient received 80 cc of  contrast. Aorta: Normal size.  No calcifications.  No dissection. Aortic Valve:  Trileaflet.  No calcifications. Coronary Arteries:  Normal coronary origin.  Right dominance. RCA is a large dominant artery that gives rise to PDA and PLA. There is proximal RCA appears to have a severe calcified plaque in the proximal RCA. The proximal to mid RCA is not well visualized and suspect that this portion of the vessel is totally occluded. Left main is a large artery that gives rise to LAD and LCX arteries. LAD is a large vessel. The proximal LAD starts with a moderate (50-69%) calcified lesion which  terminates into a severe lesion. The mid LAD with moderate calcified plaque. The mid- distal LAD with moderate plaque. LCX is a non-dominant artery that gives rise to one large OM1 branch. There is a mild calcified plaque at proximal LCX. The proximal to mid LCX with a focal moderate calcified plaque. The distal LCX with no plaques. Coronary Calcium Score: Left main: 0 Left anterior descending artery: 514 Left circumflex artery: 79.7 Right coronary artery: 116 Total: 709 Percentile: 99 Other findings: Normal pulmonary vein drainage into the left atrium. Normal left atrial appendage without a thrombus. Normal size of the pulmonary artery. IMPRESSION: 1. Coronary calcium score of 709. This was 20 percentile for age and sex matched control. 2. Normal coronary origin with right dominance. 3. 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 Consider cardiac catheterization or viability assessment. Consider symptom-guided anti-ischemic pharmacotherapy as well as risk factor modification per guideline directed care. Will also send for FFRct. The noncardiac portion of this study will be interpreted in separate report by the radiologist. Electronically Signed: By: Berniece Salines D.O. On: 03/14/2022 17:52  ? ?EEG adult ? ?Result Date: 03/13/2022 ?Lora Havens, MD     03/13/2022 10:51 AM Patient Name: PEARLEAN SABINA MRN: [id number] Epilepsy  Attending: Lora Havens Referring Physician/Provider: Truddie Hidden, MD Date: 03/13/2022 Duration: 24.26 mins Patient history: 46 year old female with seizure-like activity.  EEG to evaluate for seizure. Leve

## 2022-03-15 NOTE — Progress Notes (Signed)
FPTS Brief Progress Note ? ?S: asleep ? ? ?O: ?BP (!) 167/70 (BP Location: Left Arm)   Pulse 67   Temp 98.5 ?F (36.9 ?C) (Oral)   Resp 18   Ht '5\' 7"'$  (1.702 m)   Wt 97.5 kg   SpO2 99%   BMI 33.67 kg/m?   ? ? ?A/P: ?Patient scheduled for cardiac cath tomorrow. ?- Orders reviewed. Labs for AM ordered, which was adjusted as needed.  ?- BP mildly elevated, does not require treatment at this time. Continue to monitor. Can consider increase in home enalapril to 5 mg from 2.5 mg if remains persistently elevated. Recent data shows that overly aggressive blood pressure management in an acute, inpatient setting can lead to increased adverse events outpatient as there are more variables for elevated BP while in the hospital. Goal is to only treat if symptomatic or dangerously elevated, which patient has not been either. ?- See daytime note for full plan details. ? ?Gladys Damme, MD ?03/15/2022, 1:37 AM ?PGY-3, [place] [facility] Night Resident  ?Please page [phone] with questions.  ? ? ?

## 2022-03-15 NOTE — Progress Notes (Addendum)
Family Medicine Teaching Service ?Daily Progress Note ?Intern Pager: [phone] ? ?Patient name: Tanya Nolan Medical record number: [id number] ?Date of birth: 22-Aug-1976 Age: 47 y.o. Gender: female ? ?Primary Care Provider: [name], MD ?Consultants: Neurology, cardiology ?Code Status: Full Code  ? ?Pt Overview and Major Events to Date:  ?5/1: Admitted ? ?Assessment and Plan: ?Tanya Nolan is a 46 y.o. female presented with self reported seizure-like activity and chest pain. ?PMH is significant for HTN, DM2, hypothyroidism, HLD, MDD.  ?HD 0  ? ?Syncopal episode 2/2 cardiac vs apneic spell ?Unclear etiology, considering arrhythmia, OSA, panic attack. But low suspicion for seizure activity, especially given EEG within normal limits, no post-ictal state. Considered vasovagal syncope, however no prodrome. MRI within normal limits. got in contact with patient's brother and his wife, he stated she had a similar episode during the drive to the hospital, where she slumped over and did not respond to verbal response for about 10 minutes all of a sudden during conversation.  No rhythmic movements, convulsion, or prodromal symptoms.  Does report symptoms of snoring, waking up in the middle of the night, daytime sleepiness. ?- Neurology & cardiology consulted, appreciate assistance recommendations ?- Recommended sleep study for OSA, patient amenable ? ?Exertional and positional chest pain, s/p asa '325mg'$  with Elevated Troponins, down trending ?Last trops 112. ACS w/u negative with nml EKG, no arrhythmia. Echo EF 60-65% no signs of ischemia. UDS neg, BAL <10. Does have risk factors for ACS: HTN, DM2, TSH, HLD. Coronary CT showed 3 blocked arteries, LHC 5/3 ?- LHC 5/3 ? ?TSH  ?TSH 8.574. Sporadic adherence due to busy lifestyle.  ?- Continued home Synthroid 125 mcg ? ?HTN, improving ?Sporadic adherence at home. ?- Continued home Enalapril 2.5 mg daily ? ?DM2, Diet controlled ?HbA1c 6.6 (01/18/2022), last CBG 134 (IP CBG  goal <180).  ?- CBGs ?- A1c goal <7 ? ?HLD ?Lipid panel resulted, LDL in 70s, cholesterol 200s.  Given patient's LDL is in the 70s after she has not been taking these medications and no h/o of Mi or stroke, we will discontinue Zetia to reduce pill burden. ?- LDL goal <100 for history of diabetes ?- Continued Atorvastatin 80 mg ? ?MDD & comorbid tobacco abuse ?Sporadic adherence, has not taken home rx for last 1-2 weeks because of mood dulling side effects. Reported no improvement in mood with it, but still wants to continue zoloft. Amenable with wellbutrin for mood adjuct to zoloft and smoking cessation. D/c abilify for not helpful as adjunct and risk of future side effects. ?- Increased Zoloft 50 mg to 100 mg ?- Continued Wellbutrin XL 150 mg daily ? ?White count, down trending ?2/2 stress reaction and tobacco abuse. UA neg, no fever ?- CBC ? ?FEN/GI:    ?PPx: enoxaparin (LOVENOX) injection 40 mg Start: 03/13/22 1000  ?Dispo:Pending PT recommendations   pending workup . ? ?Subjective:  ?No family at bedside. ? ?Patient was seen sitting OOB, reported having CP upon author's arrival, but otherwise none overnight. No other concerns. ? ?Objective: ?Temp:  [97.8 ?F (36.6 ?C)-98.5 ?F (36.9 ?C)] 98 ?F (36.7 ?C) (05/03 4315) ?Pulse Rate:  [61-67] 66 (05/03 0843) ?Resp:  [15-18] 16 (05/03 4008) ?BP: (138-176)/(59-96) 158/96 (05/03 0843) ?SpO2:  [97 %-100 %] 97 % (05/03 6761) ?Weight:  [97.5 kg] 97.5 kg (05/03 0105) ?Physical Exam ?Vitals and nursing note reviewed.  ?Constitutional:   ?   General: She is awake. She is not in acute distress. ?   Appearance: She  is not ill-appearing, toxic-appearing or diaphoretic.  ?HENT:  ?   Head: Normocephalic and atraumatic.  ?Cardiovascular:  ?   Rate and Rhythm: Normal rate and regular rhythm.  ?   Heart sounds: Normal heart sounds. No murmur heard. ?  No friction rub. No gallop.  ?Pulmonary:  ?   Effort: Pulmonary effort is normal. No respiratory distress.  ?   Breath sounds: Normal  breath sounds. No wheezing or rales.  ?Abdominal:  ?   General: Bowel sounds are normal.  ?   Palpations: Abdomen is soft.  ?   Tenderness: There is no abdominal tenderness. There is no guarding or rebound.  ?Musculoskeletal:  ?   Right lower leg: No edema.  ?   Left lower leg: No edema.  ?Skin: ?   General: Skin is warm and dry.  ?Neurological:  ?   Mental Status: She is alert and oriented to person, place, and time.  ?Psychiatric:     ?   Behavior: Behavior is cooperative.  ?  ? ?Laboratory: ?Recent Labs  ?Lab 03/13/22 ?0015 03/14/22 ?0552  ?WBC 15.7* 10.9*  ?HGB 13.4 12.5  ?HCT 40.7 38.5  ?PLT 257 252  ? ?Recent Labs  ?Lab 03/13/22 ?0015 03/14/22 ?8032 03/15/22 ?0448  ?NA 137 137 138  ?K 3.7 4.1 4.0  ?CL 103 107 105  ?CO2 '24 24 25  '$ ?BUN '8 9 8  '$ ?CREATININE 0.81 0.81 0.84  ?CALCIUM 8.8* 8.5* 9.0  ?PROT 6.8  --   --   ?BILITOT 0.4  --   --   ?ALKPHOS 59  --   --   ?ALT 19  --   --   ?AST 25  --   --   ?GLUCOSE 160* 134* 138*  ? ?Results for orders placed or performed during the hospital encounter of 03/13/22 (from the past 24 hour(s))  ?Basic metabolic panel     Status: Abnormal  ? Collection Time: 03/15/22  4:48 AM  ?Result Value Ref Range  ? Sodium 138 135 - 145 mmol/L  ? Potassium 4.0 3.5 - 5.1 mmol/L  ? Chloride 105 98 - 111 mmol/L  ? CO2 25 22 - 32 mmol/L  ? Glucose, Bld 138 (H) 70 - 99 mg/dL  ? BUN 8 6 - 20 mg/dL  ? Creatinine, Ser 0.84 0.44 - 1.00 mg/dL  ? Calcium 9.0 8.9 - 10.3 mg/dL  ? GFR, Estimated >60 >60 mL/min  ? Anion gap 8 5 - 15  ? ? ?Imaging/Diagnostic Tests: ?CT CORONARY MORPH W/CTA COR W/SCORE W/CA W/CM &/OR WO/CM ? ?Addendum Date: 03/15/2022   ?ADDENDUM REPORT: 03/15/2022 10:11 EXAM: OVER-READ INTERPRETATION  CT CHEST The following report is an over-read performed by radiologist Dr. Maudry Diego [facility] on 03/15/2022. This over-read does not include interpretation of cardiac or coronary anatomy or pathology. The Cardiac/Coronary CTA interpretation by the cardiologist is  attached. COMPARISON:  None. FINDINGS: Vascular: Normal heart size. No pericardial effusion. Normal caliber thoracic aorta with mild noncalcified plaque. No suspicious filling defects of the central pulmonary arteries. Mediastinum/Nodes: Esophagus is unremarkable. No pathologically enlarged lymph nodes seen in the chest. Lungs/Pleura: Central airways are patent. Bibasilar atelectasis. No consolidation, pleural effusion or pneumothorax. Upper Abdomen: No acute abnormality. Musculoskeletal: No chest wall mass or suspicious bone lesions identified. IMPRESSION: 1. No acute extracardiac abnormality. 2.  Aortic Atherosclerosis (ICD10-I70.0). Electronically Signed   By: Yetta Glassman M.D.   On: 03/15/2022 10:11  ? ?Result Date: 03/15/2022 ?CLINICAL DATA:  This is a  46 year old female with chest pain. EXAM: Cardiac/Coronary  CTA TECHNIQUE: The patient was scanned on a [employer]. FINDINGS: A 100 kV prospective scan was triggered in the descending thoracic aorta at 111 HU's. Axial non-contrast 3 mm slices were carried out through the heart. The data set was analyzed on a dedicated work station and scored using the [place]. Gantry rotation speed was 250 msecs and collimation was .6 mm. No beta blockade and 0.8 mg of sl NTG was given. The 3D data set was reconstructed in 5% intervals of the 67-82 % of the R-R cycle. Diastolic phases were analyzed on a dedicated work station using MPR, MIP and VRT modes. The patient received 80 cc of contrast. Aorta: Normal size.  No calcifications.  No dissection. Aortic Valve:  Trileaflet.  No calcifications. Coronary Arteries:  Normal coronary origin.  Right dominance. RCA is a large dominant artery that gives rise to PDA and PLA. There is proximal RCA appears to have a severe calcified plaque in the proximal RCA. The proximal to mid RCA is not well visualized and suspect that this portion of the vessel is totally occluded. Left main is a large artery that gives rise to  LAD and LCX arteries. LAD is a large vessel. The proximal LAD starts with a moderate (50-69%) calcified lesion which terminates into a severe lesion. The mid LAD with moderate calcified plaque. The mid- distal LAD

## 2022-03-15 NOTE — Interval H&P Note (Signed)
History and Physical Interval Note: ? ?03/15/2022 ?1:05 PM ? ?Tanya Nolan  has presented today for surgery, with the diagnosis of abnormal stress test.  The various methods of treatment have been discussed with the patient and family. After consideration of risks, benefits and other options for treatment, the patient has consented to  Procedure(s): ?LEFT HEART CATH AND CORONARY ANGIOGRAPHY (N/A) as a surgical intervention.  The patient's history has been reviewed, patient examined, no change in status, stable for surgery.  I have reviewed the patient's chart and labs.  Questions were answered to the patient's satisfaction.   ? ?Cath Lab Visit (complete for each Cath Lab visit) ? ?Clinical Evaluation Leading to the Procedure:  ? ?ACS: Yes.   ? ?Non-ACS:   ? ?Anginal Classification: CCS III ? ?Anti-ischemic medical therapy: No Therapy ? 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 ?Prior CABG: No previous CABG ? ? ? ? ? ? ? ?[name] ? ? ?

## 2022-03-15 NOTE — Consult Note (Addendum)
? ?   ?[address] ?      [place] [address] ?            [phone]       ? ?[name] ?[place] Record #[id number] ?Date of Birth: August 15, 1976 ? ?Referring: Dr. Angelena Form, MD ?Primary Care: [name], MD ?Primary Cardiologist: New to [facility] ? ?Chief Complaint:    ?Chief Complaint  ?Patient presents with  ? Seizures  ? ?Reason for consultation: Coronary artery disease ? ?History of Present Illness:     ?This is a 46 year old AA female with a past medical history of hypertension, hyperlipidemia, DM, tobacco abuse, chronic back pain, hypothyroidism, PCOS, obesity, and MDD who presented to [facility] ED with complaints of chest pain, "seizure like activity" and going unresponsive. Patient was having intercourse with a friend, when she had an episode of chest pain and had a "seizure". She also apparently bit her tongue and had urinary incontinence. EKG in ED showed normal sinus rhythm. She also had an EEG and MRI which were negative for an acute process. Upon further questioning, patient endorsed chest pain and DOE for about the past month as well as palpitations. Initial Troponin I was 49 and max was 146. Echo showed LVEF 60-65% and no significant valvular disease. 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 She then underwent a cardiac catheterization earlier today which showed proximal and mid LAD 80% stenosed, proximal to mid Circumflex 80% stenosed , OM 2 80% stenosed, and proximal RCA 100% stenosed. Dr. Cyndia Bent has been consulted for the consideration of coronary artery bypas grafting surgery. At the time of my examination, she denies chest pain or shortness of breath. Vital signs are BP 163/79, HR 66, oxygenation 100% on room air.  ? ?Current Activity/ Functional Status: ?Patient is independent with  mobility/ambulation, transfers, ADL's, IADL's. ?  ?Zubrod Score: ?At the time of surgery this patient?s most appropriate activity status/level should be described as: ?_0     0    Normal activity, no symptoms ?_1     1    Restricted in physical strenuous activity but ambulatory, able to do out light work ?_2     2    Ambulatory and capable of self care, unable to do work activities, up and about more than 50%  Of the time                            ?_3     3    Only limited self care, in bed greater than 50% of waking hours ?_4     4    Completely disabled, no self care, confined to bed or chair ?_5     5    Moribund ? ?Past Medical History:  ?Diagnosis Date  ? Abdominal pain 03/26/2009  ? Qualifier: Diagnosis of  By: Jerline Pain MD, Anderson Malta    ? Anxiety   ? BACK STRAIN, LUMBAR 03/26/2009  ? Qualifier: Diagnosis of  By: Jerline Pain MD, Anderson Malta    ? Chalazion 10/01/2007  ? Qualifier: Diagnosis of  By: Genene Churn MD, Janett Billow    ? Chronic back pain   ? tx with ibuprofen  ? Diabetes mellitus   ? Hypercholesteremia   ? Hyperlipidemia   ? no meds - tx with diet  ?  Hypertension   ? Hypothyroidism   ? Ingrown toenail 12/25/2014  ? 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 C&B WNL  NEG PAPS after  ? MDD (major depressive disorder), recurrent severe, without psychosis ([place]) 03/15/2020  ? Nipple discharge 10/13/2013  ? Nodule of skin of left foot 04/15/2021  ? Non compliance w medication regimen 10/13/2013  ? 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 BENIGN SECRETORY 02/2007  ? Thyroid dysfunction   ? ? ?Past Surgical History:  ?Procedure Laterality Date  ? [place] DECOMPRESSION  2008  ? HYSTEROSCOPY X 2  2007 / 2012  ? ENDOMETRIAL POLYPS REMOVED  ? ? ?Social History  ? ?Tobacco Use  ?Smoking Status Every Day  ? Packs/day: 1.00  ? Years: 8.00  ? Pack years: 8.00  ? Types: Cigarettes  ?Smokeless Tobacco Never  ?Admits to marijuana use ? ?Social History  ? ?Substance and Sexual Activity  ?Alcohol Use Yes  ? Alcohol/week: 0.0 standard  drinks  ? Comment: socially - wine/liquor--Rare  ?Patient works as a [employer]arry manager ? ?Allergies: ?No Known Allergies ? ?Current Facility-Administered Medications  ?Medication Dose Route Frequency Provider Last Rate Last Admin  ? 0.9 %  sodium chloride infusion   Intravenous Continuous Burnell Blanks, MD      ? 0.9 %  sodium chloride infusion  250 mL Intravenous PRN Burnell Blanks, MD      ? acetaminophen (TYLENOL) tablet 650 mg  650 mg Oral Q6H PRN Burnell Blanks, MD      ? Or  ? acetaminophen (TYLENOL) suppository 650 mg  650 mg Rectal Q6H PRN Burnell Blanks, MD      ? Derrill Memo ON 03/16/2022] aspirin chewable tablet 81 mg  81 mg Oral Daily Burnell Blanks, MD      ? atorvastatin (LIPITOR) tablet 80 mg  80 mg Oral Daily Burnell Blanks, MD   80 mg at 03/15/22 8182  ? buPROPion (WELLBUTRIN XL) 24 hr tablet 150 mg  150 mg Oral Daily Burnell Blanks, MD   150 mg at 03/15/22 0843  ? enalapril (VASOTEC) tablet 2.5 mg  2.5 mg Oral Daily Burnell Blanks, MD   2.5 mg at 03/15/22 9937  ? [START ON 03/16/2022] enoxaparin (LOVENOX) injection 40 mg  40 mg Subcutaneous Q24H Burnell Blanks, MD      ? hydrALAZINE (APRESOLINE) injection 10 mg  10 mg Intravenous Q20 Min PRN Burnell Blanks, MD      ? labetalol (NORMODYNE) injection 10 mg  10 mg Intravenous Q10 min PRN Burnell Blanks, MD      ? levothyroxine (SYNTHROID) tablet 125 mcg  125 mcg Oral QAC breakfast Burnell Blanks, MD   125 mcg at 03/15/22 [phone]  ? LORazepam (ATIVAN) injection 2 mg  2 mg Intravenous PRN Burnell Blanks, MD      ? metoprolol tartrate (LOPRESSOR) tablet 50 mg  50 mg Oral Once Burnell Blanks, MD      ? Derrill Memo ON 03/16/2022] sertraline (ZOLOFT) tablet 100 mg  100 mg Oral Daily Burnell Blanks, MD      ? sodium chloride flush (NS) 0.9 % injection 3 mL  3 mL Intravenous Q12H Burnell Blanks, MD   3 mL at 03/15/22 0843  ? sodium chloride flush  (NS) 0.9 % injection 3 mL  3 mL Intravenous Q12H Burnell Blanks, MD      ? sodium chloride flush (NS) 0.9 % injection 3 mL  3  mL Intravenous PRN Burnell Blanks, MD      ? ? ?Medications Prior to Admission  ?Medication Sig Dispense Refill Last Dose  ? acetaminophen (TYLENOL) 500 MG tablet Take 1,000 mg by mouth every 6 (six) hours as needed for moderate pain or headache.   Past Month  ? albuterol (PROAIR HFA) 108 (90 Base) MCG/ACT inhaler Inhale 1-2 puffs into the lungs every 6 (six) hours as needed for wheezing or shortness of breath. 18 g 1 unk  ? ARIPiprazole (ABILIFY) 5 MG tablet Take 1 tablet (5 mg total) by mouth daily. For mood control 30 tablet 3 03/10/2022  ? atorvastatin (LIPITOR) 80 MG tablet Take 1 tablet (80 mg total) by mouth daily. 90 tablet 1 03/10/2022  ? cetirizine (ZYRTEC) 10 MG tablet Take 1 tablet (10 mg total) by mouth daily. (Patient taking differently: Take 10 mg by mouth daily as needed for allergies.) 30 tablet 11 Past Month  ? enalapril (VASOTEC) 2.5 MG tablet Take 1 tablet (2.5 mg total) by mouth daily. For high blood pressure 90 tablet 1 03/10/2022  ? ezetimibe (ZETIA) 10 MG tablet Take 1 tablet (10 mg total) by mouth daily. 90 tablet 1 03/10/2022  ? levothyroxine (SYNTHROID) 125 MCG tablet Take 1 tablet (125 mcg total) by mouth daily before breakfast. Reported on 02/23/2016 90 tablet 1 03/10/2022  ? sertraline (ZOLOFT) 100 MG tablet Take 1 tablet (100 mg total) by mouth daily. For depression 30 tablet 3 03/10/2022  ? traZODone (DESYREL) 50 MG tablet Take 1-2 tablets (50-100 mg total) by mouth at bedtime as needed for sleep. 60 tablet 3 Past Month  ? Accu-Chek FastClix Lancets MISC Use to check CBG TID 100 each 12   ? BD ULTRA-FINE PEN NEEDLES 29G X 12.7MM MISC USE THREE TIMES A DAY BEFORE MEALS 1 each 2   ? Blood Glucose Monitoring Suppl (ACCU-CHEK GUIDE) w/Device KIT 1 applicator by Does not apply route 3 (three) times daily. Use to check CBG TID 1 kit 0   ? empagliflozin  (JARDIANCE) 10 MG TABS tablet Take 1 tablet (10 mg total) by mouth daily. (Patient not taking: Reported on 01/18/2022) 90 tablet 3 Not Taking  ? glucose blood (ACCU-CHEK GUIDE) test strip USE TO CHECK GBG 3 TIMES A DAY 100 s

## 2022-03-16 ENCOUNTER — Inpatient Hospital Stay (HOSPITAL_COMMUNITY): Payer: Medicaid Other

## 2022-03-16 ENCOUNTER — Encounter (HOSPITAL_COMMUNITY): Payer: Self-pay | Admitting: Cardiovascular Disease

## 2022-03-16 DIAGNOSIS — E1169 Type 2 diabetes mellitus with other specified complication: Secondary | ICD-10-CM | POA: Diagnosis not present

## 2022-03-16 DIAGNOSIS — I240 Acute coronary thrombosis not resulting in myocardial infarction: Secondary | ICD-10-CM | POA: Diagnosis not present

## 2022-03-16 DIAGNOSIS — I152 Hypertension secondary to endocrine disorders: Secondary | ICD-10-CM

## 2022-03-16 DIAGNOSIS — R778 Other specified abnormalities of plasma proteins: Secondary | ICD-10-CM | POA: Diagnosis not present

## 2022-03-16 DIAGNOSIS — I2510 Atherosclerotic heart disease of native coronary artery without angina pectoris: Secondary | ICD-10-CM

## 2022-03-16 DIAGNOSIS — I259 Chronic ischemic heart disease, unspecified: Secondary | ICD-10-CM

## 2022-03-16 DIAGNOSIS — Z01810 Encounter for preprocedural cardiovascular examination: Secondary | ICD-10-CM | POA: Diagnosis not present

## 2022-03-16 DIAGNOSIS — E1159 Type 2 diabetes mellitus with other circulatory complications: Secondary | ICD-10-CM | POA: Diagnosis not present

## 2022-03-16 HISTORY — DX: Chronic ischemic heart disease, unspecified: I25.9

## 2022-03-16 LAB — BASIC METABOLIC PANEL
Anion gap: 8 (ref 5–15)
BUN: 9 mg/dL (ref 6–20)
CO2: 24 mmol/L (ref 22–32)
Calcium: 9.1 mg/dL (ref 8.9–10.3)
Chloride: 106 mmol/L (ref 98–111)
Creatinine, Ser: 0.83 mg/dL (ref 0.44–1.00)
GFR, Estimated: >60 mL/min (ref >60)
Glucose, Bld: 119 mg/dL — ABNORMAL HIGH (ref 70–99)
Potassium: 4.5 mmol/L (ref 3.5–5.1)
Sodium: 138 mmol/L (ref 135–145)

## 2022-03-16 LAB — PULMONARY FUNCTION TEST
FEF 25-75 Pre: 4.28 L/sec
FEF2575-%Pred-Pre: 145 %
FEV1-%Pred-Pre: 124 %
FEV1-Pre: 3.42 L
FEV1FVC-%Pred-Pre: 103 %
FEV6-%Pred-Pre: 119 %
FEV6-Pre: 3.94 L
FEV6FVC-%Pred-Pre: 101 %
FVC-%Pred-Pre: 117 %
FVC-Pre: 3.96 L
Pre FEV1/FVC ratio: 86 %
Pre FEV6/FVC Ratio: 99 %

## 2022-03-16 LAB — CBC
HCT: 40.9 % (ref 36.0–46.0)
Hemoglobin: 13.4 g/dL (ref 12.0–15.0)
MCH: 29.1 pg (ref 26.0–34.0)
MCHC: 32.8 g/dL (ref 30.0–36.0)
MCV: 88.7 fL (ref 80.0–100.0)
Platelets: 254 10*3/uL (ref 150–400)
RBC: 4.61 MIL/uL (ref 3.87–5.11)
RDW: 14 % (ref 11.5–15.5)
WBC: 11.6 10*3/uL — ABNORMAL HIGH (ref 4.0–10.5)
nRBC: 0 % (ref 0.0–0.2)

## 2022-03-16 LAB — GLUCOSE, CAPILLARY
Glucose-Capillary: 133 mg/dL — ABNORMAL HIGH (ref 70–99)
Glucose-Capillary: 181 mg/dL — ABNORMAL HIGH (ref 70–99)

## 2022-03-16 LAB — TYPE AND SCREEN
ABO/RH(D): A POS
Antibody Screen: NEGATIVE

## 2022-03-16 LAB — ABO/RH: ABO/RH(D): A POS

## 2022-03-16 MED ORDER — INSULIN REGULAR(HUMAN) IN NACL 100-0.9 UT/100ML-% IV SOLN
INTRAVENOUS | Status: AC
Start: 2022-03-17 — End: 2022-03-17
  Administered 2022-03-17: 2.6 [IU]/h via INTRAVENOUS
  Filled 2022-03-16: qty 100

## 2022-03-16 MED ORDER — TEMAZEPAM 7.5 MG PO CAPS
15.0000 mg | ORAL_CAPSULE | Freq: Once | ORAL | Status: AC | PRN
  Administered 2022-03-16: 15 mg via ORAL
  Filled 2022-03-16: qty 2

## 2022-03-16 MED ORDER — MUPIROCIN 2 % EX OINT: 1.0000 "application " | TOPICAL_OINTMENT | Freq: Two times a day (BID) | CUTANEOUS | Status: DC

## 2022-03-16 MED ORDER — VANCOMYCIN HCL 1500 MG/300ML IV SOLN
1500.0000 mg | INTRAVENOUS | Status: AC
  Administered 2022-03-17: 1500 mg via INTRAVENOUS
  Filled 2022-03-16: qty 300

## 2022-03-16 MED ORDER — PHENYLEPHRINE HCL-NACL 20-0.9 MG/250ML-% IV SOLN
30.0000 ug/min | INTRAVENOUS | Status: AC
  Administered 2022-03-17: 20 ug/min via INTRAVENOUS
  Filled 2022-03-16: qty 250

## 2022-03-16 MED ORDER — CHLORHEXIDINE GLUCONATE CLOTH 2 % EX PADS
6.0000 | MEDICATED_PAD | Freq: Once | CUTANEOUS | Status: AC
  Administered 2022-03-16: 6 via TOPICAL

## 2022-03-16 MED ORDER — CHLORHEXIDINE GLUCONATE CLOTH 2 % EX PADS
6.0000 | MEDICATED_PAD | Freq: Once | CUTANEOUS | Status: AC
  Administered 2022-03-17: 6 via TOPICAL

## 2022-03-16 MED ORDER — MAGNESIUM SULFATE 50 % IJ SOLN
40.0000 meq | INTRAMUSCULAR | Status: DC
Start: 2022-03-17 — End: 2022-03-17
  Filled 2022-03-16: qty 9.85

## 2022-03-16 MED ORDER — TRANEXAMIC ACID 1000 MG/10ML IV SOLN
1.5000 mg/kg/h | INTRAVENOUS | Status: AC
  Administered 2022-03-17: 1.5 mg/kg/h via INTRAVENOUS
  Filled 2022-03-16: qty 25

## 2022-03-16 MED ORDER — DEXMEDETOMIDINE HCL IN NACL 400 MCG/100ML IV SOLN
0.1000 ug/kg/h | INTRAVENOUS | Status: AC
  Administered 2022-03-17: .3 ug/kg/h via INTRAVENOUS
  Filled 2022-03-16: qty 100

## 2022-03-16 MED ORDER — BISACODYL 5 MG PO TBEC
5.0000 mg | DELAYED_RELEASE_TABLET | Freq: Once | ORAL | Status: AC
  Administered 2022-03-16: 5 mg via ORAL
  Filled 2022-03-16: qty 1

## 2022-03-16 MED ORDER — TRANEXAMIC ACID (OHS) PUMP PRIME SOLUTION
2.0000 mg/kg | INTRAVENOUS | Status: DC
  Filled 2022-03-16: qty 1.95

## 2022-03-16 MED ORDER — ENALAPRIL MALEATE 5 MG PO TABS
10.0000 mg | ORAL_TABLET | Freq: Every day | ORAL | Status: DC
  Administered 2022-03-16: 10 mg via ORAL
  Filled 2022-03-16 (×2): qty 2

## 2022-03-16 MED ORDER — MILRINONE LACTATE IN DEXTROSE 20-5 MG/100ML-% IV SOLN
0.3000 ug/kg/min | INTRAVENOUS | Status: DC
  Filled 2022-03-16: qty 100

## 2022-03-16 MED ORDER — HEPARIN 30,000 UNITS/1000 ML (OHS) CELLSAVER SOLUTION
Status: DC
  Filled 2022-03-16: qty 1000

## 2022-03-16 MED ORDER — NITROGLYCERIN IN D5W 200-5 MCG/ML-% IV SOLN
2.0000 ug/min | INTRAVENOUS | Status: AC
  Administered 2022-03-17: 5 ug/min via INTRAVENOUS
  Filled 2022-03-16: qty 250

## 2022-03-16 MED ORDER — TRANEXAMIC ACID (OHS) BOLUS VIA INFUSION
15.0000 mg/kg | INTRAVENOUS | Status: AC
  Administered 2022-03-17: 1462.5 mg via INTRAVENOUS
  Filled 2022-03-16: qty 1463

## 2022-03-16 MED ORDER — DIAZEPAM 5 MG PO TABS
5.0000 mg | ORAL_TABLET | Freq: Once | ORAL | Status: AC
  Administered 2022-03-17: 5 mg via ORAL
  Filled 2022-03-16: qty 1

## 2022-03-16 MED ORDER — METOPROLOL TARTRATE 12.5 MG HALF TABLET
12.5000 mg | ORAL_TABLET | Freq: Once | ORAL | Status: AC
  Administered 2022-03-17: 12.5 mg via ORAL
  Filled 2022-03-16: qty 1

## 2022-03-16 MED ORDER — CHLORHEXIDINE GLUCONATE 0.12 % MT SOLN
15.0000 mL | Freq: Once | OROMUCOSAL | Status: AC
  Administered 2022-03-17: 15 mL via OROMUCOSAL
  Filled 2022-03-16: qty 15

## 2022-03-16 MED ORDER — NOREPINEPHRINE 4 MG/250ML-% IV SOLN
0.0000 ug/min | INTRAVENOUS | Status: DC
  Filled 2022-03-16: qty 250

## 2022-03-16 MED ORDER — EPINEPHRINE HCL 5 MG/250ML IV SOLN IN NS
0.0000 ug/min | INTRAVENOUS | Status: DC
  Filled 2022-03-16: qty 250

## 2022-03-16 MED ORDER — CEFAZOLIN SODIUM-DEXTROSE 2-4 GM/100ML-% IV SOLN
2.0000 g | INTRAVENOUS | Status: AC
  Administered 2022-03-17 (×2): 2 g via INTRAVENOUS
  Filled 2022-03-16: qty 100

## 2022-03-16 MED ORDER — CEFAZOLIN SODIUM-DEXTROSE 2-4 GM/100ML-% IV SOLN
2.0000 g | INTRAVENOUS | Status: DC
  Filled 2022-03-16: qty 100

## 2022-03-16 MED ORDER — METOPROLOL TARTRATE 12.5 MG HALF TABLET
12.5000 mg | ORAL_TABLET | Freq: Two times a day (BID) | ORAL | Status: DC
  Administered 2022-03-16 (×2): 12.5 mg via ORAL
  Filled 2022-03-16 (×2): qty 1

## 2022-03-16 MED ORDER — POTASSIUM CHLORIDE 2 MEQ/ML IV SOLN
80.0000 meq | INTRAVENOUS | Status: DC
  Filled 2022-03-16: qty 40

## 2022-03-16 MED ORDER — PLASMA-LYTE A IV SOLN
INTRAVENOUS | Status: DC
  Filled 2022-03-16: qty 2.5

## 2022-03-16 NOTE — Progress Notes (Addendum)
? ?Progress Note ? ?Patient Name: Tanya Nolan ?Date of Encounter: 03/16/2022 ? ?[facility] Cardiologist: Glenetta Hew, MD  ? ?Subjective  ? ?No chest pain. Offers no complaint ? ?Inpatient Medications  ?  ?Scheduled Meds: ? aspirin  81 mg Oral Daily  ? atorvastatin  80 mg Oral Daily  ? buPROPion  150 mg Oral Daily  ? enalapril  10 mg Oral Daily  ? enoxaparin (LOVENOX) injection  40 mg Subcutaneous Q24H  ? levothyroxine  125 mcg Oral QAC breakfast  ? metoprolol tartrate  12.5 mg Oral BID  ? metoprolol tartrate  50 mg Oral Once  ? sertraline  100 mg Oral Daily  ? sodium chloride flush  3 mL Intravenous Q12H  ? sodium chloride flush  3 mL Intravenous Q12H  ? ?Continuous Infusions: ? sodium chloride    ? ?PRN Meds: ?sodium chloride, acetaminophen **OR** acetaminophen, LORazepam, sodium chloride flush  ? ?Vital Signs  ?  ?Vitals:  ? 03/15/22 1421 03/15/22 2033 03/15/22 2036 03/16/22 0458  ?BP: (!) 163/79 (!) 160/70 (!) 160/70 (!) 164/73  ?Pulse: 62 66  60  ?Resp: '18 18  16  '$ ?Temp: 97.7 ?F (36.5 ?C) 98 ?F (36.7 ?C)  98 ?F (36.7 ?C)  ?TempSrc: Oral Oral  Oral  ?SpO2: 95% 100% 100% 100%  ?Weight:      ?Height:      ? ? ?Intake/Output Summary (Last 24 hours) at 03/16/2022 0947 ?Last data filed at 03/16/2022 0300 ?Gross per 24 hour  ?Intake 548.8 ml  ?Output --  ?Net 548.8 ml  ? ? ?  03/15/2022  ?  1:05 AM 03/13/2022  ? 12:20 AM 01/18/2022  ? 10:52 AM  ?Last 3 Weights  ?Weight (lbs) 214 lb 15.2 oz 215 lb 6.2 oz 215 lb 6.4 oz  ?Weight (kg) 97.5 kg 97.7 kg 97.705 kg  ?   ? ?Telemetry  ?  ?Sinus Rhythm 60-70s - Personally Reviewed ? ?ECG  ?  ?No new tracing ? ?Physical Exam  ? ?GEN: No acute distress.   ?Neck: No JVD ?Cardiac: RRR, no murmurs, rubs, or gallops.  ?Respiratory: Clear to auscultation bilaterally. ?GI: Soft, nontender, non-distended  ?MS: No edema; No deformity. Right radial cath site stable.  ?Neuro:  Nonfocal  ?Psych: Normal affect  ? ?Labs  ?  ?High Sensitivity Troponin:   ?Recent Labs  ?Lab 03/13/22 ?0015  03/13/22 ?0206 03/13/22 ?7681 03/13/22 ?1572  ?TROPONINIHS 49* 146* 133* 112*  ?   ?Chemistry ?Recent Labs  ?Lab 03/13/22 ?0015 03/14/22 ?6203 03/15/22 ?0448 03/16/22 ?0329  ?NA 137 137 138 138  ?K 3.7 4.1 4.0 4.5  ?CL 103 107 105 106  ?CO2 '24 24 25 24  '$ ?GLUCOSE 160* 134* 138* 119*  ?BUN '8 9 8 9  '$ ?CREATININE 0.81 0.81 0.84 0.83  ?CALCIUM 8.8* 8.5* 9.0 9.1  ?PROT 6.8  --   --   --   ?ALBUMIN 3.6  --   --   --   ?AST 25  --   --   --   ?ALT 19  --   --   --   ?ALKPHOS 59  --   --   --   ?BILITOT 0.4  --   --   --   ?GFRNONAA >60 >60 >60 >60  ?ANIONGAP '10 6 8 8  '$ ?  ?Lipids  ?Recent Labs  ?Lab 03/14/22 ?0552  ?CHOL 118  ?TRIG 100  ?HDL 25*  ?[place] 73  ?CHOLHDL 4.7  ?  ?Hematology ?Recent  Labs  ?Lab 03/13/22 ?0015 03/14/22 ?5277 03/16/22 ?0329  ?WBC 15.7* 10.9* 11.6*  ?RBC 4.56 4.33 4.61  ?HGB 13.4 12.5 13.4  ?HCT 40.7 38.5 40.9  ?MCV 89.3 88.9 88.7  ?MCH 29.4 28.9 29.1  ?MCHC 32.9 32.5 32.8  ?RDW 14.2 14.0 14.0  ?PLT 257 252 254  ? ?Thyroid  ?Recent Labs  ?Lab 03/13/22 ?0015  ?TSH 8.574*  ?  ?BNPNo results for input(s): BNP, PROBNP in the last 168 hours.  ?DDimer No results for input(s): DDIMER in the last 168 hours.  ? ?Radiology  ?  ?CARDIAC CATHETERIZATION ? ?Result Date: 03/15/2022 ?  Prox RCA lesion is 100% stenosed.   Prox Cx to Mid Cx lesion is 80% stenosed.   2nd Mrg lesion is 90% stenosed.   Prox LAD lesion is 80% stenosed.   Mid LAD lesion is 80% stenosed. Severe triple vessel CAD Severe proximal and mid LAD stenosis Severe mid Circumflex. Severe obtuse marginal stenosis The large dominant RCA has chronic total occlusion of the proximal vessel. The distal vessel fills from left to right collaterals. Recommendations: CT surgery consult for CABG.  ? ?CT CORONARY MORPH W/CTA COR W/SCORE W/CA W/CM &/OR WO/CM ? ?Addendum Date: 03/15/2022   ?ADDENDUM REPORT: 03/15/2022 10:11 EXAM: OVER-READ INTERPRETATION  CT CHEST The following report is an over-read performed by radiologist Dr. Maudry Diego [facility] on 03/15/2022. This over-read does not include interpretation of cardiac or coronary anatomy or pathology. The Cardiac/Coronary CTA interpretation by the cardiologist is attached. COMPARISON:  None. FINDINGS: Vascular: Normal heart size. No pericardial effusion. Normal caliber thoracic aorta with mild noncalcified plaque. No suspicious filling defects of the central pulmonary arteries. Mediastinum/Nodes: Esophagus is unremarkable. No pathologically enlarged lymph nodes seen in the chest. Lungs/Pleura: Central airways are patent. Bibasilar atelectasis. No consolidation, pleural effusion or pneumothorax. Upper Abdomen: No acute abnormality. Musculoskeletal: No chest wall mass or suspicious bone lesions identified. IMPRESSION: 1. No acute extracardiac abnormality. 2.  Aortic Atherosclerosis (ICD10-I70.0). Electronically Signed   By: Yetta Glassman M.D.   On: 03/15/2022 10:11  ? ?Result Date: 03/15/2022 ?CLINICAL DATA:  This is a 46 year old female with chest pain. EXAM: Cardiac/Coronary  CTA TECHNIQUE: The patient was scanned on a [employer]. FINDINGS: A 100 kV prospective scan was triggered in the descending thoracic aorta at 111 HU's. Axial non-contrast 3 mm slices were carried out through the heart. The data set was analyzed on a dedicated work station and scored using the [place]. Gantry rotation speed was 250 msecs and collimation was .6 mm. No beta blockade and 0.8 mg of sl NTG was given. The 3D data set was reconstructed in 5% intervals of the 67-82 % of the R-R cycle. Diastolic phases were analyzed on a dedicated work station using MPR, MIP and VRT modes. The patient received 80 cc of contrast. Aorta: Normal size.  No calcifications.  No dissection. Aortic Valve:  Trileaflet.  No calcifications. Coronary Arteries:  Normal coronary origin.  Right dominance. RCA is a large dominant artery that gives rise to PDA and PLA. There is proximal RCA appears to have a severe calcified plaque  in the proximal RCA. The proximal to mid RCA is not well visualized and suspect that this portion of the vessel is totally occluded. Left main is a large artery that gives rise to LAD and LCX arteries. LAD is a large vessel. The proximal LAD starts with a moderate (50-69%) calcified lesion which terminates into a severe lesion. The  mid LAD with moderate calcified plaque. The mid- distal LAD with moderate plaque. LCX is a non-dominant artery that gives rise to one large OM1 branch. There is a mild calcified plaque at proximal LCX. The proximal to mid LCX with a focal moderate calcified plaque. The distal LCX with no plaques. Coronary Calcium Score: Left main: 0 Left anterior descending artery: 514 Left circumflex artery: 79.7 Right coronary artery: 116 Total: 709 Percentile: 99 Other findings: Normal pulmonary vein drainage into the left atrium. Normal left atrial appendage without a thrombus. Normal size of the pulmonary artery. IMPRESSION: 1. Coronary calcium score of 709. This was 11 percentile for age and sex matched control. 2. Normal coronary origin with right dominance. 3. 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 Consider cardiac catheterization or viability assessment. Consider symptom-guided anti-ischemic pharmacotherapy as well as risk factor modification per guideline directed care. Will also send for FFRct. The noncardiac portion of this study will be interpreted in separate report by the radiologist. Electronically Signed: By: Berniece Salines D.O. On: 03/14/2022 17:52  ? ?CT CORONARY FRACTIONAL FLOW RESERVE DATA PREP ? ?Result Date: 03/14/2022 ?EXAM: CT FFR ANALYSIS CLINICAL DATA:  Coronary artery disease (CAD) (Ped 0-17y) FINDINGS: FFRct analysis was performed on the original cardiac CT angiogram dataset. Diagrammatic representation of the FFRct analysis is provided in a separate PDF document in PACS. This dictation was created using the PDF document and an interactive 3D model of the results. 3D model is  not available in the EMR/PACS. Normal FFR range is >0.80. Indeterminate (grey) zone is 0.76-0.80. 1. Left Main: FFR = 1.00 2. LAD: Proximal FFR = 0.74, mid FFR = 0.50, distal FFR = 0.50 3. LCX: Proximal FFR = 0.92, di

## 2022-03-16 NOTE — Progress Notes (Signed)
Subjective: ? ?No chest pain or SOB ? ?Objective: ?Vital signs in last 24 hours: ?Temp:  [97.8 ?F (36.6 ?C)-98 ?F (36.7 ?C)] 97.8 ?F (36.6 ?C) (05/04 1326) ?Pulse Rate:  [60-71] 69 (05/04 1326) ?Cardiac Rhythm: Normal sinus rhythm (05/03 1927) ?Resp:  [16-18] 18 (05/04 1326) ?BP: (135-164)/(56-73) 135/56 (05/04 1326) ?SpO2:  [100 %] 100 % (05/04 1326) ? ?Hemodynamic parameters for last 24 hours: ?  ? ?Intake/Output from previous day: ?05/03 0701 - 05/04 0700 ?In: 548.8 [I.V.:548.8] ?Out: -  ?Intake/Output this shift: ?No intake/output data recorded. ? ?General appearance: alert and cooperative ?Neurologic: intact ?Heart: regular rate and rhythm ?Lungs: clear to auscultation bilaterally ? ?Lab Results: ?Recent Labs  ?  03/14/22 ?6314 03/16/22 ?0329  ?WBC 10.9* 11.6*  ?HGB 12.5 13.4  ?HCT 38.5 40.9  ?PLT 252 254  ? ?BMET:  ?Recent Labs  ?  03/15/22 ?0448 03/16/22 ?0329  ?NA 138 138  ?K 4.0 4.5  ?CL 105 106  ?CO2 25 24  ?GLUCOSE 138* 119*  ?BUN 8 9  ?CREATININE 0.84 0.83  ?CALCIUM 9.0 9.1  ?  ?PT/INR: No results for input(s): LABPROT, INR in the last 72 hours. ?ABG ?No results found for: PHART, HCO3, TCO2, ACIDBASEDEF, O2SAT ?CBG (last 3)  ?Recent Labs  ?  03/15/22 ?2121 03/16/22 ?0746 03/16/22 ?1157  ?GLUCAP 175* 133* 181*  ? ? ?Assessment/Plan: ? ?Severe multivessel CAD. Preop arterial dopplers show that left hand is not radial artery dependent. Plan CABG tomorrow with left radial artery graft. Patient and mother have no further questions. I discussed the operative procedure with the patient and her mother including alternatives, benefits and risks; including but not limited to bleeding, blood transfusion, infection, stroke, myocardial infarction, graft failure, heart block requiring a permanent pacemaker, organ dysfunction, and death.  Tanya Nolan understands and agrees to proceed.   ? ? LOS: 1 day  ? ? ?Tanya Nolan ?03/16/2022 ? ? ?

## 2022-03-16 NOTE — Progress Notes (Signed)
Pre-CABG Dopplers completed. ?Refer to "CV Proc" under chart review to view preliminary results. ? ?03/16/2022 11:22 AM ?Kelby Aline., MHA, RVT, RDCS, RDMS   ?

## 2022-03-16 NOTE — Progress Notes (Signed)
Discussed with pt and mother IS (1800 ml), sternal precautions, mobility post op, and d/c planning. Pt voiced understanding, no questions. She sts her brother and his family will be able to assist post op as it seems she cares for her mom. Gave ed materials. She has had light chest pressure in bed.  ?[id number] ?Yves Dill CES, ACSM ?1:52 PM ?03/16/2022 ? ?

## 2022-03-16 NOTE — Progress Notes (Signed)
FPTS Brief Progress Note ? ?S: sleeping soundly ? ? ?O: ?BP (!) 160/70   Pulse 66   Temp 98 ?F (36.7 ?C) (Oral)   Resp 18   Ht '5\' 7"'$  (1.702 m)   Wt 97.5 kg   SpO2 100%   BMI 33.67 kg/m?   ? ? ?A/P: ?- Orders reviewed. Labs for AM ordered, which was adjusted as needed.  ?- See daily progress note for details of plan. Scheduled for CABG on Friday. ? ?Gladys Damme, MD ?03/16/2022, 2:49 AM ?PGY-3, [facility] Night Resident  ?Please page [phone] with questions.  ? ? ?

## 2022-03-16 NOTE — H&P (View-Only) (Signed)
Subjective: ? ?No chest pain or SOB ? ?Objective: ?Vital signs in last 24 hours: ?Temp:  [97.8 ?F (36.6 ?C)-98 ?F (36.7 ?C)] 97.8 ?F (36.6 ?C) (05/04 1326) ?Pulse Rate:  [60-71] 69 (05/04 1326) ?Cardiac Rhythm: Normal sinus rhythm (05/03 1927) ?Resp:  [16-18] 18 (05/04 1326) ?BP: (135-164)/(56-73) 135/56 (05/04 1326) ?SpO2:  [100 %] 100 % (05/04 1326) ? ?Hemodynamic parameters for last 24 hours: ?  ? ?Intake/Output from previous day: ?05/03 0701 - 05/04 0700 ?In: 548.8 [I.V.:548.8] ?Out: -  ?Intake/Output this shift: ?No intake/output data recorded. ? ?General appearance: alert and cooperative ?Neurologic: intact ?Heart: regular rate and rhythm ?Lungs: clear to auscultation bilaterally ? ?Lab Results: ?Recent Labs  ?  03/14/22 ?4008 03/16/22 ?0329  ?WBC 10.9* 11.6*  ?HGB 12.5 13.4  ?HCT 38.5 40.9  ?PLT 252 254  ? ?BMET:  ?Recent Labs  ?  03/15/22 ?0448 03/16/22 ?0329  ?NA 138 138  ?K 4.0 4.5  ?CL 105 106  ?CO2 25 24  ?GLUCOSE 138* 119*  ?BUN 8 9  ?CREATININE 0.84 0.83  ?CALCIUM 9.0 9.1  ?  ?PT/INR: No results for input(s): LABPROT, INR in the last 72 hours. ?ABG ?No results found for: PHART, HCO3, TCO2, ACIDBASEDEF, O2SAT ?CBG (last 3)  ?Recent Labs  ?  03/15/22 ?2121 03/16/22 ?0746 03/16/22 ?1157  ?GLUCAP 175* 133* 181*  ? ? ?Assessment/Plan: ? ?Severe multivessel CAD. Preop arterial dopplers show that left hand is not radial artery dependent. Plan CABG tomorrow with left radial artery graft. Patient and mother have no further questions. I discussed the operative procedure with the patient and her mother including alternatives, benefits and risks; including but not limited to bleeding, blood transfusion, infection, stroke, myocardial infarction, graft failure, heart block requiring a permanent pacemaker, organ dysfunction, and death.  Tanya Nolan understands and agrees to proceed.   ? ? LOS: 1 day  ? ? ?[name] ?03/16/2022 ? ? ?

## 2022-03-16 NOTE — Progress Notes (Addendum)
Family Medicine Teaching Service ?Daily Progress Note ?Intern Pager: [phone] ? ?Patient name: Tanya Nolan Medical record number: [id number] ?Date of birth: [birth year] Age: 46 y.o. Gender: female ? ?Primary Care Provider: [name], MD ?Consultants: Neurology, cardiology, CT surgery ?Code Status: Full Code  ? ?Pt Overview and Major Events to Date:  ?5/1: Admitted ?5/3: LHC ?5/4: CABG ? ?Assessment and Plan: ?Tanya Nolan is a 46 y.o. female presented with self reported seizure-like activity and chest pain. ?PMH is significant for HTN, DM2, hypothyroidism, HLD, MDD.  ?HD 3 ? ?Syncopal episode 2/2 cardiac vs apneic spell vs vasovagal ?Likely multifactorial, stroke suspicion for cardiac etiology. Possibly apneic episode, does report symptoms of snoring, waking up in the middle of the night, daytime sleepiness. Could be vasovagal, however no prodromal sxs. MRI within normal limits. Seizure unlikely given no post-ictal, no rhythmic movements, rEEG wnl.  ?- Neurology & cardiology consulted, appreciate assistance recommendations ?- Recommended sleep study for OSA, patient amenable ?- Cardiac monitoring after discharge for arrhythmia  ? 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 Echo EF 60-65%. UDS neg, BAL <10. ?- CABG 5/4 ?- Metoprolol 12.'5mg'$  BID ? ?TSH  ?TSH 8.574. Sporadic adherence due to busy lifestyle.  ?- Continued home Synthroid 125 mcg ? ?HTN, improving ?- Continued home Enalapril 2.5 mg daily ? ?DM2, Diet controlled ?HbA1c 6.6 (01/18/2022), last CBG 134 (IP CBG goal <180). Was on jardiance in the past. ?- CBGs ?- A1c goal <7 ? ?HLD ?LDL in 70s, cholesterol 200s. ?- LDL goal <100 for history of diabetes ?- Continued Atorvastatin 80 mg ? ?MDD & comorbid tobacco abuse ?Sporadic adherence, has not taken home rx for last 1-2 weeks because of mood dulling side effects. Reported no improvement in mood with it, but still wants to continue zoloft. Amenable with wellbutrin for mood adjuct to zoloft and  smoking cessation. D/c abilify for not helpful as adjunct and risk of future side effects. ?- Increased Zoloft 50 mg to 100 mg ?- Continued Wellbutrin XL 150 mg daily, inc after CABG if tolerated ? ?White count, down trending ?2/2 stress reaction and tobacco abuse. UA neg, no fever. ?- CBC ? ?FEN/GI: NPO at midnight for CABG ?PPx: enoxaparin (LOVENOX) injection 40 mg Start: 03/16/22 1000  ?Dispo: Pending PT/OT and CABG 5/4  ? ?Subjective:  ?Lane (mom) at bedside. ? ?Mom and aunt had MI at 32s as well.  ?Patient and mom has no concerns or questions. No CP, SOB, abdominal pain, N/V. ? ?Objective: ?Temp:  [97.8 ?F (36.6 ?C)-98 ?F (36.7 ?C)] 97.8 ?F (36.6 ?C) (05/04 1326) ?Pulse Rate:  [60-71] 69 (05/04 1326) ?Resp:  [16-18] 18 (05/04 1326) ?BP: (135-164)/(56-73) 135/56 (05/04 1326) ?SpO2:  [100 %] 100 % (05/04 1326) ?Physical Exam ?Vitals and nursing note reviewed.  ?Constitutional:   ?   General: She is awake. She is not in acute distress. ?   Appearance: She is not ill-appearing, toxic-appearing or diaphoretic.  ?HENT:  ?   Head: Normocephalic and atraumatic.  ?Cardiovascular:  ?   Rate and Rhythm: Normal rate and regular rhythm.  ?   Heart sounds: Normal heart sounds. No murmur heard. ?  No friction rub. No gallop.  ?Pulmonary:  ?   Effort: Pulmonary effort is normal. No respiratory distress.  ?   Breath sounds: Normal breath sounds. No wheezing or rales.  ?Abdominal:  ?   General: Bowel sounds are normal.  ?   Palpations: Abdomen is soft.  ?   Tenderness:  There is no abdominal tenderness. There is no guarding or rebound.  ?Musculoskeletal:  ?   Right lower leg: No edema.  ?   Left lower leg: No edema.  ?Skin: ?   General: Skin is warm and dry.  ?Neurological:  ?   Mental Status: She is alert and oriented to person, place, and time.  ?Psychiatric:     ?   Behavior: Behavior is cooperative.  ?  ? ?Laboratory: ?Recent Labs  ?Lab 03/13/22 ?0015 03/14/22 ?7353 03/16/22 ?0329  ?WBC 15.7* 10.9* 11.6*  ?HGB 13.4 12.5 13.4   ?HCT 40.7 38.5 40.9  ?PLT 257 252 254  ? ?Recent Labs  ?Lab 03/13/22 ?0015 03/14/22 ?2992 03/15/22 ?0448 03/16/22 ?0329  ?NA 137 137 138 138  ?K 3.7 4.1 4.0 4.5  ?CL 103 107 105 106  ?CO2 '24 24 25 24  '$ ?BUN '8 9 8 9  '$ ?CREATININE 0.81 0.81 0.84 0.83  ?CALCIUM 8.8* 8.5* 9.0 9.1  ?PROT 6.8  --   --   --   ?BILITOT 0.4  --   --   --   ?ALKPHOS 59  --   --   --   ?ALT 19  --   --   --   ?AST 25  --   --   --   ?GLUCOSE 160* 134* 138* 119*  ? ?Results for orders placed or performed during the hospital encounter of 03/13/22 (from the past 24 hour(s))  ?Glucose, capillary     Status: None  ? Collection Time: 03/15/22  4:24 PM  ?Result Value Ref Range  ? Glucose-Capillary 99 70 - 99 mg/dL  ?Glucose, capillary     Status: Abnormal  ? Collection Time: 03/15/22  9:21 PM  ?Result Value Ref Range  ? Glucose-Capillary 175 (H) 70 - 99 mg/dL  ?CBC     Status: Abnormal  ? Collection Time: 03/16/22  3:29 AM  ?Result Value Ref Range  ? WBC 11.6 (H) 4.0 - 10.5 K/uL  ? RBC 4.61 3.87 - 5.11 MIL/uL  ? Hemoglobin 13.4 12.0 - 15.0 g/dL  ? HCT 40.9 36.0 - 46.0 %  ? MCV 88.7 80.0 - 100.0 fL  ? MCH 29.1 26.0 - 34.0 pg  ? MCHC 32.8 30.0 - 36.0 g/dL  ? RDW 14.0 11.5 - 15.5 %  ? Platelets 254 150 - 400 K/uL  ? nRBC 0.0 0.0 - 0.2 %  ?Basic metabolic panel     Status: Abnormal  ? Collection Time: 03/16/22  3:29 AM  ?Result Value Ref Range  ? Sodium 138 135 - 145 mmol/L  ? Potassium 4.5 3.5 - 5.1 mmol/L  ? Chloride 106 98 - 111 mmol/L  ? CO2 24 22 - 32 mmol/L  ? Glucose, Bld 119 (H) 70 - 99 mg/dL  ? BUN 9 6 - 20 mg/dL  ? Creatinine, Ser 0.83 0.44 - 1.00 mg/dL  ? Calcium 9.1 8.9 - 10.3 mg/dL  ? GFR, Estimated >60 >60 mL/min  ? Anion gap 8 5 - 15  ? ? ?Imaging/Diagnostic Tests: ?VAS US DOPPLER PRE CABG ? ?Result Date: 03/16/2022 ?PREOPERATIVE VASCULAR EVALUATION Patient Name:  Tanya Nolan  Date of Exam:   03/16/2022 Medical Rec #: [id number]      Accession #:    [id number] Date of Birth: 1976/11/13     Patient Gender: F Patient Age:   69 years Exam  Location:  [facility] Procedure:      VAS US DOPPLER PRE CABG Referring Phys:  BRYAN BARTLE --------------------------------------------------------------------------------  Indications:      Pre-CABG. Risk Factors:     Hypertension, hyperlipidemia, Diabetes, current smoker,                   coronary artery disease. Comparison Study: No prior study Performing Technologist: Maudry Mayhew MHA, RVT, RDCS, RDMS  Examination Guidelines: A complete evaluation includes B-mode imaging, spectral Doppler, color Doppler, and power Doppler as needed of all accessible portions of each vessel. Bilateral testing is considered an integral part of a complete examination. Limited examinations for reoccurring indications may be performed as noted.  Right Carotid Findings: +----------+--------+-------+--------+----------------------+------------------+           PSV cm/sEDV    StenosisDescribe              Comments                             cm/s                                                    +----------+--------+-------+--------+----------------------+------------------+ CCA Prox  87      8                                    intimal thickening +----------+--------+-------+--------+----------------------+------------------+ CCA Distal65      16                                                      +----------+--------+-------+--------+----------------------+------------------+ ICA Prox  70      15             smooth and                                                                heterogenous                             +----------+--------+-------+--------+----------------------+------------------+ ICA Distal104     35                                                      +----------+--------+-------+--------+----------------------+------------------+ ECA       134     14                                                       +----------+--------+-------+--------+----------------------+------------------+ +----------+--------+-------+----------------+------------+           PSV cm/sEDV cmsDescribe        Arm Pressure +----------+--------+-------+----------------+------------+ [id number]  Multiphasic, WNL

## 2022-03-17 ENCOUNTER — Encounter (HOSPITAL_COMMUNITY): Payer: Self-pay | Admitting: Family Medicine

## 2022-03-17 ENCOUNTER — Inpatient Hospital Stay (HOSPITAL_COMMUNITY)
Admission: EM | Disposition: A | Discharge status: Home / Self Care | Payer: Self-pay | Source: Home / Self Care | Attending: Family Medicine

## 2022-03-17 ENCOUNTER — Inpatient Hospital Stay (HOSPITAL_COMMUNITY): Payer: Medicaid Other | Admitting: Anesthesiology

## 2022-03-17 ENCOUNTER — Other Ambulatory Visit: Payer: Self-pay

## 2022-03-17 ENCOUNTER — Inpatient Hospital Stay (HOSPITAL_COMMUNITY): Payer: Medicaid Other

## 2022-03-17 DIAGNOSIS — F419 Anxiety disorder, unspecified: Secondary | ICD-10-CM

## 2022-03-17 DIAGNOSIS — I2510 Atherosclerotic heart disease of native coronary artery without angina pectoris: Secondary | ICD-10-CM | POA: Diagnosis present

## 2022-03-17 DIAGNOSIS — E119 Type 2 diabetes mellitus without complications: Secondary | ICD-10-CM

## 2022-03-17 DIAGNOSIS — F17210 Nicotine dependence, cigarettes, uncomplicated: Secondary | ICD-10-CM

## 2022-03-17 DIAGNOSIS — I10 Essential (primary) hypertension: Secondary | ICD-10-CM

## 2022-03-17 HISTORY — PX: TEE WITHOUT CARDIOVERSION: SHX5443

## 2022-03-17 HISTORY — PX: RADIAL ARTERY HARVEST: SHX5067

## 2022-03-17 HISTORY — PX: CORONARY ARTERY BYPASS GRAFT: SHX141

## 2022-03-17 LAB — CBC
HCT: 38.4 % (ref 36.0–46.0)
HCT: 32.4 % — ABNORMAL LOW (ref 36.0–46.0)
HCT: 29.3 % — ABNORMAL LOW (ref 36.0–46.0)
Hemoglobin: 12.4 g/dL (ref 12.0–15.0)
Hemoglobin: 10.9 g/dL — ABNORMAL LOW (ref 12.0–15.0)
Hemoglobin: 9.7 g/dL — ABNORMAL LOW (ref 12.0–15.0)
MCH: 28.8 pg (ref 26.0–34.0)
MCH: 29.8 pg (ref 26.0–34.0)
MCH: 29.8 pg (ref 26.0–34.0)
MCHC: 32.3 g/dL (ref 30.0–36.0)
MCHC: 33.6 g/dL (ref 30.0–36.0)
MCHC: 33.1 g/dL (ref 30.0–36.0)
MCV: 89.3 fL (ref 80.0–100.0)
MCV: 88.5 fL (ref 80.0–100.0)
MCV: 89.9 fL (ref 80.0–100.0)
Platelets: 245 10*3/uL (ref 150–400)
Platelets: 171 10*3/uL (ref 150–400)
Platelets: 167 10*3/uL (ref 150–400)
RBC: 4.3 MIL/uL (ref 3.87–5.11)
RBC: 3.66 MIL/uL — ABNORMAL LOW (ref 3.87–5.11)
RBC: 3.26 MIL/uL — ABNORMAL LOW (ref 3.87–5.11)
RDW: 14.1 % (ref 11.5–15.5)
RDW: 14 % (ref 11.5–15.5)
RDW: 14.2 % (ref 11.5–15.5)
WBC: 12.6 10*3/uL — ABNORMAL HIGH (ref 4.0–10.5)
WBC: 15.4 10*3/uL — ABNORMAL HIGH (ref 4.0–10.5)
WBC: 14.1 10*3/uL — ABNORMAL HIGH (ref 4.0–10.5)
nRBC: 0 % (ref 0.0–0.2)
nRBC: 0 % (ref 0.0–0.2)
nRBC: 0 % (ref 0.0–0.2)

## 2022-03-17 LAB — POCT I-STAT 7, (LYTES, BLD GAS, ICA,H+H)
Acid-Base Excess: 1 mmol/L (ref 0.0–2.0)
Acid-Base Excess: 0 mmol/L (ref 0.0–2.0)
Acid-base deficit: 2 mmol/L (ref 0.0–2.0)
Acid-base deficit: 1 mmol/L (ref 0.0–2.0)
Acid-base deficit: 1 mmol/L (ref 0.0–2.0)
Acid-base deficit: 2 mmol/L (ref 0.0–2.0)
Bicarbonate: 25.1 mmol/L (ref 20.0–28.0)
Bicarbonate: 26.3 mmol/L (ref 20.0–28.0)
Bicarbonate: 23.9 mmol/L (ref 20.0–28.0)
Bicarbonate: 24 mmol/L (ref 20.0–28.0)
Bicarbonate: 23.1 mmol/L (ref 20.0–28.0)
Bicarbonate: 24.3 mmol/L (ref 20.0–28.0)
Calcium, Ion: 1.26 mmol/L (ref 1.15–1.40)
Calcium, Ion: 0.99 mmol/L — ABNORMAL LOW (ref 1.15–1.40)
Calcium, Ion: 1.05 mmol/L — ABNORMAL LOW (ref 1.15–1.40)
Calcium, Ion: 1.06 mmol/L — ABNORMAL LOW (ref 1.15–1.40)
Calcium, Ion: 1.09 mmol/L — ABNORMAL LOW (ref 1.15–1.40)
Calcium, Ion: 1.1 mmol/L — ABNORMAL LOW (ref 1.15–1.40)
HCT: 39 % (ref 36.0–46.0)
HCT: 27 % — ABNORMAL LOW (ref 36.0–46.0)
HCT: 26 % — ABNORMAL LOW (ref 36.0–46.0)
HCT: 26 % — ABNORMAL LOW (ref 36.0–46.0)
HCT: 26 % — ABNORMAL LOW (ref 36.0–46.0)
HCT: 31 % — ABNORMAL LOW (ref 36.0–46.0)
Hemoglobin: 13.3 g/dL (ref 12.0–15.0)
Hemoglobin: 9.2 g/dL — ABNORMAL LOW (ref 12.0–15.0)
Hemoglobin: 8.8 g/dL — ABNORMAL LOW (ref 12.0–15.0)
Hemoglobin: 8.8 g/dL — ABNORMAL LOW (ref 12.0–15.0)
Hemoglobin: 10.5 g/dL — ABNORMAL LOW (ref 12.0–15.0)
Hemoglobin: 8.8 g/dL — ABNORMAL LOW (ref 12.0–15.0)
O2 Saturation: 99 %
O2 Saturation: 100 %
O2 Saturation: 100 %
O2 Saturation: 100 %
O2 Saturation: 100 %
O2 Saturation: 99 %
Potassium: 4 mmol/L (ref 3.5–5.1)
Potassium: 4.3 mmol/L (ref 3.5–5.1)
Potassium: 3.9 mmol/L (ref 3.5–5.1)
Potassium: 5.1 mmol/L (ref 3.5–5.1)
Potassium: 4.5 mmol/L (ref 3.5–5.1)
Potassium: 4.9 mmol/L (ref 3.5–5.1)
Sodium: 139 mmol/L (ref 135–145)
Sodium: 138 mmol/L (ref 135–145)
Sodium: 137 mmol/L (ref 135–145)
Sodium: 138 mmol/L (ref 135–145)
Sodium: 137 mmol/L (ref 135–145)
Sodium: 138 mmol/L (ref 135–145)
TCO2: 27 mmol/L (ref 22–32)
TCO2: 28 mmol/L (ref 22–32)
TCO2: 25 mmol/L (ref 22–32)
TCO2: 25 mmol/L (ref 22–32)
TCO2: 24 mmol/L (ref 22–32)
TCO2: 26 mmol/L (ref 22–32)
pCO2 arterial: 51 mmHg — ABNORMAL HIGH (ref 32–48)
pCO2 arterial: 46.8 mmHg (ref 32–48)
pCO2 arterial: 36.1 mmHg (ref 32–48)
pCO2 arterial: 37.5 mmHg (ref 32–48)
pCO2 arterial: 42 mmHg (ref 32–48)
pCO2 arterial: 44.7 mmHg (ref 32–48)
pH, Arterial: 7.3 — ABNORMAL LOW (ref 7.35–7.45)
pH, Arterial: 7.358 (ref 7.35–7.45)
pH, Arterial: 7.411 (ref 7.35–7.45)
pH, Arterial: 7.431 (ref 7.35–7.45)
pH, Arterial: 7.344 — ABNORMAL LOW (ref 7.35–7.45)
pH, Arterial: 7.349 — ABNORMAL LOW (ref 7.35–7.45)
pO2, Arterial: 153 mmHg — ABNORMAL HIGH (ref 83–108)
pO2, Arterial: 431 mmHg — ABNORMAL HIGH (ref 83–108)
pO2, Arterial: 272 mmHg — ABNORMAL HIGH (ref 83–108)
pO2, Arterial: 359 mmHg — ABNORMAL HIGH (ref 83–108)
pO2, Arterial: 143 mmHg — ABNORMAL HIGH (ref 83–108)
pO2, Arterial: 235 mmHg — ABNORMAL HIGH (ref 83–108)

## 2022-03-17 LAB — POCT I-STAT EG7
Acid-base deficit: 3 mmol/L — ABNORMAL HIGH (ref 0.0–2.0)
Bicarbonate: 23.6 mmol/L (ref 20.0–28.0)
Calcium, Ion: 1.08 mmol/L — ABNORMAL LOW (ref 1.15–1.40)
HCT: 29 % — ABNORMAL LOW (ref 36.0–46.0)
Hemoglobin: 9.9 g/dL — ABNORMAL LOW (ref 12.0–15.0)
O2 Saturation: 86 %
Potassium: 3.8 mmol/L (ref 3.5–5.1)
Sodium: 140 mmol/L (ref 135–145)
TCO2: 25 mmol/L (ref 22–32)
pCO2, Ven: 46.8 mmHg (ref 44–60)
pH, Ven: 7.31 (ref 7.25–7.43)
pO2, Ven: 57 mmHg — ABNORMAL HIGH (ref 32–45)

## 2022-03-17 LAB — GLUCOSE, CAPILLARY
Glucose-Capillary: 152 mg/dL — ABNORMAL HIGH (ref 70–99)
Glucose-Capillary: 180 mg/dL — ABNORMAL HIGH (ref 70–99)
Glucose-Capillary: 112 mg/dL — ABNORMAL HIGH (ref 70–99)
Glucose-Capillary: 115 mg/dL — ABNORMAL HIGH (ref 70–99)
Glucose-Capillary: 86 mg/dL (ref 70–99)
Glucose-Capillary: 108 mg/dL — ABNORMAL HIGH (ref 70–99)
Glucose-Capillary: 95 mg/dL (ref 70–99)
Glucose-Capillary: 112 mg/dL — ABNORMAL HIGH (ref 70–99)
Glucose-Capillary: 112 mg/dL — ABNORMAL HIGH (ref 70–99)
Glucose-Capillary: 129 mg/dL — ABNORMAL HIGH (ref 70–99)
Glucose-Capillary: 114 mg/dL — ABNORMAL HIGH (ref 70–99)

## 2022-03-17 LAB — BASIC METABOLIC PANEL
Anion gap: 7 (ref 5–15)
Anion gap: 8 (ref 5–15)
BUN: 10 mg/dL (ref 6–20)
BUN: 7 mg/dL (ref 6–20)
CO2: 23 mmol/L (ref 22–32)
CO2: 23 mmol/L (ref 22–32)
Calcium: 9 mg/dL (ref 8.9–10.3)
Calcium: 8 mg/dL — ABNORMAL LOW (ref 8.9–10.3)
Chloride: 108 mmol/L (ref 98–111)
Chloride: 109 mmol/L (ref 98–111)
Creatinine, Ser: 0.79 mg/dL (ref 0.44–1.00)
Creatinine, Ser: 0.74 mg/dL (ref 0.44–1.00)
GFR, Estimated: >60 mL/min (ref >60)
GFR, Estimated: >60 mL/min (ref >60)
Glucose, Bld: 120 mg/dL — ABNORMAL HIGH (ref 70–99)
Glucose, Bld: 103 mg/dL — ABNORMAL HIGH (ref 70–99)
Potassium: 4.1 mmol/L (ref 3.5–5.1)
Potassium: 4.6 mmol/L (ref 3.5–5.1)
Sodium: 138 mmol/L (ref 135–145)
Sodium: 140 mmol/L (ref 135–145)

## 2022-03-17 LAB — POCT I-STAT, CHEM 8
BUN: 8 mg/dL (ref 6–20)
BUN: 7 mg/dL (ref 6–20)
BUN: 7 mg/dL (ref 6–20)
BUN: 7 mg/dL (ref 6–20)
Calcium, Ion: 1.25 mmol/L (ref 1.15–1.40)
Calcium, Ion: 1.22 mmol/L (ref 1.15–1.40)
Calcium, Ion: 1.06 mmol/L — ABNORMAL LOW (ref 1.15–1.40)
Calcium, Ion: 1.07 mmol/L — ABNORMAL LOW (ref 1.15–1.40)
Chloride: 103 mmol/L (ref 98–111)
Chloride: 105 mmol/L (ref 98–111)
Chloride: 101 mmol/L (ref 98–111)
Chloride: 102 mmol/L (ref 98–111)
Creatinine, Ser: 0.6 mg/dL (ref 0.44–1.00)
Creatinine, Ser: 0.6 mg/dL (ref 0.44–1.00)
Creatinine, Ser: 0.6 mg/dL (ref 0.44–1.00)
Creatinine, Ser: 0.7 mg/dL (ref 0.44–1.00)
Glucose, Bld: 120 mg/dL — ABNORMAL HIGH (ref 70–99)
Glucose, Bld: 124 mg/dL — ABNORMAL HIGH (ref 70–99)
Glucose, Bld: 94 mg/dL (ref 70–99)
Glucose, Bld: 158 mg/dL — ABNORMAL HIGH (ref 70–99)
HCT: 38 % (ref 36.0–46.0)
HCT: 35 % — ABNORMAL LOW (ref 36.0–46.0)
HCT: 26 % — ABNORMAL LOW (ref 36.0–46.0)
HCT: 29 % — ABNORMAL LOW (ref 36.0–46.0)
Hemoglobin: 12.9 g/dL (ref 12.0–15.0)
Hemoglobin: 11.9 g/dL — ABNORMAL LOW (ref 12.0–15.0)
Hemoglobin: 8.8 g/dL — ABNORMAL LOW (ref 12.0–15.0)
Hemoglobin: 9.9 g/dL — ABNORMAL LOW (ref 12.0–15.0)
Potassium: 4 mmol/L (ref 3.5–5.1)
Potassium: 3.9 mmol/L (ref 3.5–5.1)
Potassium: 3.9 mmol/L (ref 3.5–5.1)
Potassium: 4.5 mmol/L (ref 3.5–5.1)
Sodium: 139 mmol/L (ref 135–145)
Sodium: 140 mmol/L (ref 135–145)
Sodium: 137 mmol/L (ref 135–145)
Sodium: 137 mmol/L (ref 135–145)
TCO2: 27 mmol/L (ref 22–32)
TCO2: 25 mmol/L (ref 22–32)
TCO2: 24 mmol/L (ref 22–32)
TCO2: 23 mmol/L (ref 22–32)

## 2022-03-17 LAB — HEMOGLOBIN AND HEMATOCRIT, BLOOD
HCT: 25.3 % — ABNORMAL LOW (ref 36.0–46.0)
Hemoglobin: 8.8 g/dL — ABNORMAL LOW (ref 12.0–15.0)

## 2022-03-17 LAB — PLATELET COUNT: Platelets: 147 10*3/uL — ABNORMAL LOW (ref 150–400)

## 2022-03-17 LAB — SURGICAL PCR SCREEN
MRSA, PCR: NEGATIVE
Staphylococcus aureus: NEGATIVE

## 2022-03-17 LAB — LIPOPROTEIN A (LPA): Lipoprotein (a): 61.4 nmol/L — ABNORMAL HIGH (ref <75.0)

## 2022-03-17 LAB — PROTIME-INR
INR: 1.3 — ABNORMAL HIGH (ref 0.8–1.2)
Prothrombin Time: 16.3 seconds — ABNORMAL HIGH (ref 11.4–15.2)

## 2022-03-17 LAB — ECHO INTRAOPERATIVE TEE
Height: 67 in
Weight: 3439.18 oz

## 2022-03-17 LAB — MAGNESIUM: Magnesium: 2.4 mg/dL (ref 1.7–2.4)

## 2022-03-17 LAB — APTT: aPTT: 33 seconds (ref 24–36)

## 2022-03-17 SURGERY — CORONARY ARTERY BYPASS GRAFTING (CABG)
Anesthesia: General | Site: Chest

## 2022-03-17 MED ORDER — SODIUM CHLORIDE 0.9% FLUSH
10.0000 mL | Freq: Two times a day (BID) | INTRAVENOUS | Status: DC
  Administered 2022-03-17 – 2022-03-19 (×5): 10 mL

## 2022-03-17 MED ORDER — SODIUM CHLORIDE 0.9% FLUSH: 10.0000 mL | INTRAVENOUS | Status: DC | PRN

## 2022-03-17 MED ORDER — SODIUM CHLORIDE 0.9 % IV SOLN: INTRAVENOUS | Status: DC

## 2022-03-17 MED ORDER — LIDOCAINE 2% (20 MG/ML) 5 ML SYRINGE
INTRAMUSCULAR | Status: AC
  Filled 2022-03-17: qty 5

## 2022-03-17 MED ORDER — SODIUM CHLORIDE 0.45 % IV SOLN: INTRAVENOUS | Status: DC | PRN

## 2022-03-17 MED ORDER — CALCIUM CHLORIDE 10 % IV SOLN
INTRAVENOUS | Status: DC | PRN
  Administered 2022-03-17: 100 mg via INTRAVENOUS

## 2022-03-17 MED ORDER — MIDAZOLAM HCL 2 MG/2ML IJ SOLN: 2.0000 mg | INTRAMUSCULAR | Status: DC | PRN

## 2022-03-17 MED ORDER — LACTATED RINGERS IV SOLN
INTRAVENOUS | Status: DC | PRN
Start: 2022-03-17 — End: 2022-03-17

## 2022-03-17 MED ORDER — METOPROLOL TARTRATE 25 MG/10 ML ORAL SUSPENSION: 12.5000 mg | Freq: Two times a day (BID) | ORAL | Status: DC

## 2022-03-17 MED ORDER — PROTAMINE SULFATE 10 MG/ML IV SOLN
INTRAVENOUS | Status: DC | PRN
  Administered 2022-03-17: 30 mg via INTRAVENOUS
  Administered 2022-03-17: 330 mg via INTRAVENOUS

## 2022-03-17 MED ORDER — ALBUMIN HUMAN 5 % IV SOLN: INTRAVENOUS | Status: DC | PRN

## 2022-03-17 MED ORDER — DOCUSATE SODIUM 100 MG PO CAPS
200.0000 mg | ORAL_CAPSULE | Freq: Every day | ORAL | Status: DC
  Administered 2022-03-18 – 2022-03-19 (×2): 200 mg via ORAL
  Filled 2022-03-17 (×2): qty 2

## 2022-03-17 MED ORDER — PHENYLEPHRINE 80 MCG/ML (10ML) SYRINGE FOR IV PUSH (FOR BLOOD PRESSURE SUPPORT)
PREFILLED_SYRINGE | INTRAVENOUS | Status: AC
  Filled 2022-03-17: qty 10

## 2022-03-17 MED ORDER — MIDAZOLAM HCL (PF) 10 MG/2ML IJ SOLN
INTRAMUSCULAR | Status: AC
  Filled 2022-03-17: qty 2

## 2022-03-17 MED ORDER — MIDAZOLAM HCL (PF) 5 MG/ML IJ SOLN
INTRAMUSCULAR | Status: DC | PRN
  Administered 2022-03-17 (×4): 2 mg via INTRAVENOUS

## 2022-03-17 MED ORDER — FENTANYL CITRATE (PF) 250 MCG/5ML IJ SOLN
INTRAMUSCULAR | Status: AC
  Filled 2022-03-17: qty 5

## 2022-03-17 MED ORDER — OXYCODONE HCL 5 MG PO TABS
5.0000 mg | ORAL_TABLET | ORAL | Status: DC | PRN
  Administered 2022-03-18: 10 mg via ORAL
  Administered 2022-03-18 – 2022-03-19 (×2): 5 mg via ORAL
  Administered 2022-03-19: 10 mg via ORAL
  Filled 2022-03-17: qty 1
  Filled 2022-03-17: qty 2
  Filled 2022-03-17: qty 1
  Filled 2022-03-17: qty 2

## 2022-03-17 MED ORDER — CEFAZOLIN SODIUM-DEXTROSE 2-4 GM/100ML-% IV SOLN
2.0000 g | Freq: Three times a day (TID) | INTRAVENOUS | Status: AC
  Administered 2022-03-17 – 2022-03-19 (×6): 2 g via INTRAVENOUS
  Filled 2022-03-17 (×6): qty 100

## 2022-03-17 MED ORDER — CHLORHEXIDINE GLUCONATE CLOTH 2 % EX PADS
6.0000 | MEDICATED_PAD | Freq: Every day | CUTANEOUS | Status: DC
  Administered 2022-03-17 – 2022-03-19 (×3): 6 via TOPICAL

## 2022-03-17 MED ORDER — ACETAMINOPHEN 160 MG/5ML PO SOLN: 1000.0000 mg | Freq: Four times a day (QID) | ORAL | Status: DC

## 2022-03-17 MED ORDER — HEMOSTATIC AGENTS (NO CHARGE) OPTIME
TOPICAL | Status: DC | PRN
  Administered 2022-03-17: 1 via TOPICAL

## 2022-03-17 MED ORDER — PANTOPRAZOLE SODIUM 40 MG PO TBEC
40.0000 mg | DELAYED_RELEASE_TABLET | Freq: Every day | ORAL | Status: DC
  Administered 2022-03-19 – 2022-03-21 (×3): 40 mg via ORAL
  Filled 2022-03-17 (×3): qty 1

## 2022-03-17 MED ORDER — ONDANSETRON HCL 4 MG/2ML IJ SOLN
4.0000 mg | Freq: Four times a day (QID) | INTRAMUSCULAR | Status: DC | PRN
  Administered 2022-03-18: 4 mg via INTRAVENOUS
  Filled 2022-03-17: qty 2

## 2022-03-17 MED ORDER — HEPARIN SODIUM (PORCINE) 1000 UNIT/ML IJ SOLN
INTRAMUSCULAR | Status: DC | PRN
  Administered 2022-03-17: 33000 [IU] via INTRAVENOUS

## 2022-03-17 MED ORDER — MORPHINE SULFATE (PF) 2 MG/ML IV SOLN
1.0000 mg | INTRAVENOUS | Status: DC | PRN
  Administered 2022-03-17 – 2022-03-18 (×5): 2 mg via INTRAVENOUS
  Filled 2022-03-17 (×2): qty 1
  Filled 2022-03-17: qty 2
  Filled 2022-03-17: qty 1

## 2022-03-17 MED ORDER — HEPARIN SODIUM (PORCINE) 1000 UNIT/ML IJ SOLN
INTRAMUSCULAR | Status: AC
  Filled 2022-03-17: qty 1

## 2022-03-17 MED ORDER — SODIUM CHLORIDE 0.9% FLUSH: 3.0000 mL | INTRAVENOUS | Status: DC | PRN

## 2022-03-17 MED ORDER — BISACODYL 10 MG RE SUPP: 10.0000 mg | Freq: Every day | RECTAL | Status: DC

## 2022-03-17 MED ORDER — NITROGLYCERIN IN D5W 200-5 MCG/ML-% IV SOLN: 7.0000 ug/min | INTRAVENOUS | Status: DC

## 2022-03-17 MED ORDER — MAGNESIUM SULFATE 4 GM/100ML IV SOLN
4.0000 g | Freq: Once | INTRAVENOUS | Status: AC
  Administered 2022-03-17: 4 g via INTRAVENOUS
  Filled 2022-03-17: qty 100

## 2022-03-17 MED ORDER — ROCURONIUM BROMIDE 10 MG/ML (PF) SYRINGE
PREFILLED_SYRINGE | INTRAVENOUS | Status: DC | PRN
  Administered 2022-03-17: 50 mg via INTRAVENOUS
  Administered 2022-03-17: 80 mg via INTRAVENOUS
  Administered 2022-03-17: 100 mg via INTRAVENOUS
  Administered 2022-03-17: 20 mg via INTRAVENOUS

## 2022-03-17 MED ORDER — PROPOFOL 10 MG/ML IV BOLUS
INTRAVENOUS | Status: DC | PRN
  Administered 2022-03-17: 100 mg via INTRAVENOUS
  Administered 2022-03-17: 40 mg via INTRAVENOUS

## 2022-03-17 MED ORDER — THROMBIN (RECOMBINANT) 20000 UNITS EX SOLR
CUTANEOUS | Status: AC
  Filled 2022-03-17: qty 20000

## 2022-03-17 MED ORDER — PROTAMINE SULFATE 10 MG/ML IV SOLN
INTRAVENOUS | Status: AC
  Filled 2022-03-17: qty 25

## 2022-03-17 MED ORDER — SODIUM CHLORIDE 0.9 % IV SOLN: 250.0000 mL | INTRAVENOUS | Status: DC

## 2022-03-17 MED ORDER — METOCLOPRAMIDE HCL 5 MG/ML IJ SOLN
INTRAMUSCULAR | Status: AC
  Filled 2022-03-17: qty 2

## 2022-03-17 MED ORDER — METOPROLOL TARTRATE 5 MG/5ML IV SOLN: 2.5000 mg | INTRAVENOUS | Status: DC | PRN

## 2022-03-17 MED ORDER — SODIUM CHLORIDE 0.9% FLUSH
3.0000 mL | Freq: Two times a day (BID) | INTRAVENOUS | Status: DC
  Administered 2022-03-18 – 2022-03-19 (×3): 3 mL via INTRAVENOUS

## 2022-03-17 MED ORDER — ACETAMINOPHEN 650 MG RE SUPP
650.0000 mg | Freq: Once | RECTAL | Status: AC
  Administered 2022-03-17: 650 mg via RECTAL

## 2022-03-17 MED ORDER — SODIUM CHLORIDE 0.9 % IV SOLN: INTRAVENOUS | Status: DC | PRN

## 2022-03-17 MED ORDER — INSULIN REGULAR(HUMAN) IN NACL 100-0.9 UT/100ML-% IV SOLN: INTRAVENOUS | Status: DC

## 2022-03-17 MED ORDER — FENTANYL CITRATE (PF) 250 MCG/5ML IJ SOLN
INTRAMUSCULAR | Status: AC
Start: 2022-03-17 — End: ?
  Filled 2022-03-17: qty 5

## 2022-03-17 MED ORDER — SODIUM CHLORIDE (PF) 0.9 % IJ SOLN
INTRAMUSCULAR | Status: AC
  Filled 2022-03-17: qty 10

## 2022-03-17 MED ORDER — VASOPRESSIN 20 UNIT/ML IV SOLN
INTRAVENOUS | Status: AC
  Filled 2022-03-17: qty 1

## 2022-03-17 MED ORDER — LACTATED RINGERS IV SOLN: INTRAVENOUS | Status: DC

## 2022-03-17 MED ORDER — PROPOFOL 10 MG/ML IV BOLUS
INTRAVENOUS | Status: AC
  Filled 2022-03-17: qty 20

## 2022-03-17 MED ORDER — ROCURONIUM BROMIDE 10 MG/ML (PF) SYRINGE
PREFILLED_SYRINGE | INTRAVENOUS | Status: AC
  Filled 2022-03-17: qty 10

## 2022-03-17 MED ORDER — DEXTROSE 50 % IV SOLN: 0.0000 mL | INTRAVENOUS | Status: DC | PRN

## 2022-03-17 MED ORDER — ASPIRIN 81 MG PO CHEW: 324.0000 mg | CHEWABLE_TABLET | Freq: Every day | ORAL | Status: DC

## 2022-03-17 MED ORDER — FENTANYL CITRATE (PF) 250 MCG/5ML IJ SOLN
INTRAMUSCULAR | Status: DC | PRN
  Administered 2022-03-17: 100 ug via INTRAVENOUS
  Administered 2022-03-17 (×2): 50 ug via INTRAVENOUS
  Administered 2022-03-17 (×2): 100 ug via INTRAVENOUS
  Administered 2022-03-17: 150 ug via INTRAVENOUS
  Administered 2022-03-17: 100 ug via INTRAVENOUS
  Administered 2022-03-17 (×2): 150 ug via INTRAVENOUS

## 2022-03-17 MED ORDER — PHENYLEPHRINE HCL-NACL 20-0.9 MG/250ML-% IV SOLN: 0.0000 ug/min | INTRAVENOUS | Status: DC

## 2022-03-17 MED ORDER — ORAL CARE MOUTH RINSE
15.0000 mL | OROMUCOSAL | Status: DC
  Administered 2022-03-17 – 2022-03-18 (×8): 15 mL via OROMUCOSAL

## 2022-03-17 MED ORDER — ACETAMINOPHEN 500 MG PO TABS
1000.0000 mg | ORAL_TABLET | Freq: Four times a day (QID) | ORAL | Status: DC
  Administered 2022-03-18 – 2022-03-21 (×14): 1000 mg via ORAL
  Filled 2022-03-17 (×13): qty 2

## 2022-03-17 MED ORDER — ASPIRIN EC 325 MG PO TBEC
325.0000 mg | DELAYED_RELEASE_TABLET | Freq: Every day | ORAL | Status: DC
  Administered 2022-03-18 – 2022-03-19 (×2): 325 mg via ORAL
  Filled 2022-03-17 (×2): qty 1

## 2022-03-17 MED ORDER — ACETAMINOPHEN 160 MG/5ML PO SOLN: 650.0000 mg | Freq: Once | ORAL | Status: AC

## 2022-03-17 MED ORDER — FAMOTIDINE IN NACL 20-0.9 MG/50ML-% IV SOLN
20.0000 mg | Freq: Two times a day (BID) | INTRAVENOUS | Status: DC
  Administered 2022-03-17: 20 mg via INTRAVENOUS
  Filled 2022-03-17: qty 50

## 2022-03-17 MED ORDER — LACTATED RINGERS IV SOLN: INTRAVENOUS | Status: DC | PRN

## 2022-03-17 MED ORDER — TRAMADOL HCL 50 MG PO TABS: 50.0000 mg | ORAL_TABLET | ORAL | Status: DC | PRN

## 2022-03-17 MED ORDER — PROTAMINE SULFATE 10 MG/ML IV SOLN
INTRAVENOUS | Status: AC
  Filled 2022-03-17: qty 10

## 2022-03-17 MED ORDER — VANCOMYCIN HCL IN DEXTROSE 1-5 GM/200ML-% IV SOLN
1000.0000 mg | Freq: Once | INTRAVENOUS | Status: AC
  Administered 2022-03-17: 1000 mg via INTRAVENOUS
  Filled 2022-03-17: qty 200

## 2022-03-17 MED ORDER — CHLORHEXIDINE GLUCONATE 0.12% ORAL RINSE (MEDLINE KIT)
15.0000 mL | Freq: Two times a day (BID) | OROMUCOSAL | Status: DC
  Administered 2022-03-17 – 2022-03-18 (×2): 15 mL via OROMUCOSAL

## 2022-03-17 MED ORDER — METOPROLOL TARTRATE 12.5 MG HALF TABLET: 12.5000 mg | ORAL_TABLET | Freq: Two times a day (BID) | ORAL | Status: DC

## 2022-03-17 MED ORDER — PHENYLEPHRINE 80 MCG/ML (10ML) SYRINGE FOR IV PUSH (FOR BLOOD PRESSURE SUPPORT)
PREFILLED_SYRINGE | INTRAVENOUS | Status: DC | PRN
  Administered 2022-03-17 (×2): 40 ug via INTRAVENOUS

## 2022-03-17 MED ORDER — ALBUMIN HUMAN 5 % IV SOLN
250.0000 mL | INTRAVENOUS | Status: AC | PRN
  Administered 2022-03-17 (×4): 12.5 g via INTRAVENOUS
  Filled 2022-03-17 (×2): qty 250

## 2022-03-17 MED ORDER — SUCCINYLCHOLINE CHLORIDE 200 MG/10ML IV SOSY
PREFILLED_SYRINGE | INTRAVENOUS | Status: AC
  Filled 2022-03-17: qty 10

## 2022-03-17 MED ORDER — 0.9 % SODIUM CHLORIDE (POUR BTL) OPTIME
TOPICAL | Status: DC | PRN
  Administered 2022-03-17: 5000 mL

## 2022-03-17 MED ORDER — CHLORHEXIDINE GLUCONATE 0.12 % MT SOLN
15.0000 mL | OROMUCOSAL | Status: AC
  Administered 2022-03-17: 15 mL via OROMUCOSAL

## 2022-03-17 MED ORDER — PLASMA-LYTE A IV SOLN
INTRAVENOUS | Status: DC | PRN
  Administered 2022-03-17: 500 mL via INTRAVASCULAR

## 2022-03-17 MED ORDER — BISACODYL 5 MG PO TBEC
10.0000 mg | DELAYED_RELEASE_TABLET | Freq: Every day | ORAL | Status: DC
  Administered 2022-03-18 – 2022-03-19 (×2): 10 mg via ORAL
  Filled 2022-03-17 (×3): qty 2

## 2022-03-17 MED ORDER — DEXMEDETOMIDINE HCL IN NACL 400 MCG/100ML IV SOLN
0.0000 ug/kg/h | INTRAVENOUS | Status: DC
  Administered 2022-03-17: 0.7 ug/kg/h via INTRAVENOUS
  Filled 2022-03-17: qty 100

## 2022-03-17 MED ORDER — POTASSIUM CHLORIDE 10 MEQ/50ML IV SOLN
10.0000 meq | INTRAVENOUS | Status: AC
  Administered 2022-03-17 (×3): 10 meq via INTRAVENOUS

## 2022-03-17 MED ORDER — GELATIN ABSORBABLE MT POWD
OROMUCOSAL | Status: DC | PRN
Start: 2022-03-17 — End: 2022-03-17

## 2022-03-17 MED ORDER — LACTATED RINGERS IV SOLN: 500.0000 mL | Freq: Once | INTRAVENOUS | Status: DC | PRN

## 2022-03-17 SURGICAL SUPPLY — 126 items
ADH SKN CLS APL DERMABOND .7 (GAUZE/BANDAGES/DRESSINGS) ×3
BAG DECANTER FOR FLEXI CONT (MISCELLANEOUS) ×5 IMPLANT
BLADE CLIPPER SURG (BLADE) ×5 IMPLANT
BLADE MINI RND TIP GREEN BEAV (BLADE) ×1 IMPLANT
BLADE STERNUM SYSTEM 6 (BLADE) ×5 IMPLANT
BLADE SURG 11 STRL SS (BLADE) ×1 IMPLANT
BLADE SURG 15 STRL LF DISP TIS (BLADE) ×4 IMPLANT
BLADE SURG 15 STRL SS (BLADE) ×8
BNDG ELASTIC 4X5.8 VLCR STR LF (GAUZE/BANDAGES/DRESSINGS) ×6 IMPLANT
BNDG ELASTIC 6X5.8 VLCR STR LF (GAUZE/BANDAGES/DRESSINGS) ×5 IMPLANT
BNDG GAUZE ELAST 4 BULKY (GAUZE/BANDAGES/DRESSINGS) ×6 IMPLANT
CANISTER SUCT 3000ML PPV (MISCELLANEOUS) ×5 IMPLANT
CATH ROBINSON RED A/P 18FR (CATHETERS) ×10 IMPLANT
CATH THORACIC 28FR (CATHETERS) ×5 IMPLANT
CATH THORACIC 36FR (CATHETERS) ×5 IMPLANT
CATH THORACIC 36FR RT ANG (CATHETERS) ×5 IMPLANT
CLIP TI MEDIUM 24 (CLIP) ×1 IMPLANT
CLIP TI WIDE RED SMALL 24 (CLIP) ×2 IMPLANT
CLIP VESOCCLUDE MED 24/CT (CLIP) IMPLANT
CLIP VESOCCLUDE SM WIDE 24/CT (CLIP) IMPLANT
CONTAINER PROTECT SURGISLUSH (MISCELLANEOUS) ×11 IMPLANT
COVER MAYO STAND STRL (DRAPES) ×5 IMPLANT
COVER PROBE W GEL 5X96 (DRAPES) ×1 IMPLANT
DERMABOND ADVANCED (GAUZE/BANDAGES/DRESSINGS) ×1
DERMABOND ADVANCED .7 DNX12 (GAUZE/BANDAGES/DRESSINGS) IMPLANT
DRAPE CARDIOVASCULAR INCISE (DRAPES) ×4
DRAPE EXTREMITY T 121X128X90 (DISPOSABLE) ×5 IMPLANT
DRAPE HALF SHEET 40X57 (DRAPES) ×5 IMPLANT
DRAPE SRG 135X102X78XABS (DRAPES) ×4 IMPLANT
DRAPE WARM FLUID 44X44 (DRAPES) ×4 IMPLANT
DRSG COVADERM 4X14 (GAUZE/BANDAGES/DRESSINGS) ×5 IMPLANT
ELECT BLADE 4.0 EZ CLEAN MEGAD (MISCELLANEOUS) ×4
ELECT CAUTERY BLADE 6.4 (BLADE) ×5 IMPLANT
ELECT REM PT RETURN 9FT ADLT (ELECTROSURGICAL) ×8
ELECTRODE BLDE 4.0 EZ CLN MEGD (MISCELLANEOUS) IMPLANT
ELECTRODE REM PT RTRN 9FT ADLT (ELECTROSURGICAL) ×8 IMPLANT
FELT TEFLON 1X6 (MISCELLANEOUS) ×9 IMPLANT
GAUZE 4X4 16PLY ~~LOC~~+RFID DBL (SPONGE) ×5 IMPLANT
GAUZE SPONGE 4X4 12PLY STRL (GAUZE/BANDAGES/DRESSINGS) ×11 IMPLANT
GEL ULTRASOUND 20GR AQUASONIC (MISCELLANEOUS) ×5 IMPLANT
GLOVE BIO SURGEON STRL SZ 6 (GLOVE) ×1 IMPLANT
GLOVE BIO SURGEON STRL SZ 6.5 (GLOVE) IMPLANT
GLOVE BIO SURGEON STRL SZ7 (GLOVE) IMPLANT
GLOVE BIO SURGEON STRL SZ7.5 (GLOVE) IMPLANT
GLOVE BIOGEL PI IND STRL 6 (GLOVE) IMPLANT
GLOVE BIOGEL PI IND STRL 6.5 (GLOVE) IMPLANT
GLOVE BIOGEL PI IND STRL 7.0 (GLOVE) IMPLANT
GLOVE BIOGEL PI INDICATOR 6 (GLOVE)
GLOVE BIOGEL PI INDICATOR 6.5 (GLOVE)
GLOVE BIOGEL PI INDICATOR 7.0 (GLOVE)
GLOVE ORTHO TXT STRL SZ7.5 (GLOVE) IMPLANT
GLOVE SS BIOGEL STRL SZ 7 (GLOVE) IMPLANT
GLOVE SUPERSENSE BIOGEL SZ 7 (GLOVE) ×2
GLOVE SURG MICRO LTX SZ7 (GLOVE) ×10 IMPLANT
GOWN STRL REUS W/ TWL LRG LVL3 (GOWN DISPOSABLE) ×16 IMPLANT
GOWN STRL REUS W/ TWL XL LVL3 (GOWN DISPOSABLE) ×4 IMPLANT
GOWN STRL REUS W/TWL LRG LVL3 (GOWN DISPOSABLE) ×24
GOWN STRL REUS W/TWL XL LVL3 (GOWN DISPOSABLE) ×8
HEMOSTAT POWDER SURGIFOAM 1G (HEMOSTASIS) ×15 IMPLANT
HEMOSTAT SURGICEL 2X14 (HEMOSTASIS) ×5 IMPLANT
INSERT FOGARTY 61MM (MISCELLANEOUS) IMPLANT
INSERT FOGARTY XLG (MISCELLANEOUS) ×1 IMPLANT
IV CATH 18G X1.75 CATHLON (IV SOLUTION) ×1 IMPLANT
KIT BASIN OR (CUSTOM PROCEDURE TRAY) ×5 IMPLANT
KIT CATH CPB BARTLE (MISCELLANEOUS) ×5 IMPLANT
KIT SUCTION CATH 14FR (SUCTIONS) ×5 IMPLANT
KIT TURNOVER KIT B (KITS) ×5 IMPLANT
KIT VASOVIEW HEMOPRO 2 VH 4000 (KITS) ×5 IMPLANT
NS IRRIG 1000ML POUR BTL (IV SOLUTION) ×25 IMPLANT
PACK E OPEN HEART (SUTURE) ×5 IMPLANT
PACK OPEN HEART (CUSTOM PROCEDURE TRAY) ×5 IMPLANT
PAD ARMBOARD 7.5X6 YLW CONV (MISCELLANEOUS) ×10 IMPLANT
PAD ELECT DEFIB RADIOL ZOLL (MISCELLANEOUS) ×5 IMPLANT
PENCIL BUTTON HOLSTER BLD 10FT (ELECTRODE) ×4 IMPLANT
PENCIL SMOKE EVACUATOR (MISCELLANEOUS) ×1 IMPLANT
POSITIONER HEAD DONUT 9IN (MISCELLANEOUS) ×5 IMPLANT
PUNCH AORTIC ROTATE 4.0MM (MISCELLANEOUS) IMPLANT
PUNCH AORTIC ROTATE 4.5MM 8IN (MISCELLANEOUS) ×5 IMPLANT
PUNCH AORTIC ROTATE 5MM 8IN (MISCELLANEOUS) IMPLANT
SET MPS 3-ND DEL (MISCELLANEOUS) ×1 IMPLANT
SHEARS HARMONIC 9CM CVD (BLADE) ×5 IMPLANT
SPONGE INTESTINAL PEANUT (DISPOSABLE) IMPLANT
SPONGE T-LAP 18X18 ~~LOC~~+RFID (SPONGE) ×21 IMPLANT
SPONGE T-LAP 4X18 ~~LOC~~+RFID (SPONGE) ×10 IMPLANT
SUPPORT HEART JANKE-BARRON (MISCELLANEOUS) ×5 IMPLANT
SUT BONE WAX W31G (SUTURE) ×5 IMPLANT
SUT MNCRL AB 3-0 PS2 18 (SUTURE) ×1 IMPLANT
SUT MNCRL AB 4-0 PS2 18 (SUTURE) ×1 IMPLANT
SUT PROLENE 3 0 SH DA (SUTURE) IMPLANT
SUT PROLENE 3 0 SH1 36 (SUTURE) ×5 IMPLANT
SUT PROLENE 4 0 RB 1 (SUTURE) ×4
SUT PROLENE 4 0 SH DA (SUTURE) IMPLANT
SUT PROLENE 4-0 RB1 .5 CRCL 36 (SUTURE) IMPLANT
SUT PROLENE 5 0 C 1 36 (SUTURE) IMPLANT
SUT PROLENE 6 0 C 1 30 (SUTURE) ×1 IMPLANT
SUT PROLENE 7 0 BV 1 (SUTURE) IMPLANT
SUT PROLENE 7 0 BV1 MDA (SUTURE) ×5 IMPLANT
SUT PROLENE 8 0 BV175 6 (SUTURE) ×3 IMPLANT
SUT SILK  1 MH (SUTURE)
SUT SILK 1 MH (SUTURE) IMPLANT
SUT SILK 2 0 (SUTURE) ×4
SUT SILK 2 0 SH (SUTURE) IMPLANT
SUT SILK 2 0 SH CR/8 (SUTURE) ×1 IMPLANT
SUT SILK 2-0 18XBRD TIE 12 (SUTURE) IMPLANT
SUT STEEL STERNAL CCS#1 18IN (SUTURE) ×1 IMPLANT
SUT STEEL SZ 6 DBL 3X14 BALL (SUTURE) ×2 IMPLANT
SUT VIC AB 1 CTX 36 (SUTURE) ×8
SUT VIC AB 1 CTX36XBRD ANBCTR (SUTURE) ×8 IMPLANT
SUT VIC AB 2-0 CT1 27 (SUTURE) ×8
SUT VIC AB 2-0 CT1 TAPERPNT 27 (SUTURE) IMPLANT
SUT VIC AB 2-0 CTX 27 (SUTURE) ×1 IMPLANT
SUT VIC AB 3-0 SH 27 (SUTURE)
SUT VIC AB 3-0 SH 27X BRD (SUTURE) IMPLANT
SUT VIC AB 3-0 X1 27 (SUTURE) ×5 IMPLANT
SUT VICRYL 4-0 PS2 18IN ABS (SUTURE) IMPLANT
SYR 5ML LL (SYRINGE) ×1 IMPLANT
SYSTEM SAHARA CHEST DRAIN ATS (WOUND CARE) ×5 IMPLANT
TAPE CLOTH SURG 4X10 WHT LF (GAUZE/BANDAGES/DRESSINGS) ×1 IMPLANT
TAPE PAPER 2X10 WHT MICROPORE (GAUZE/BANDAGES/DRESSINGS) ×1 IMPLANT
TOWEL GREEN STERILE (TOWEL DISPOSABLE) ×6 IMPLANT
TOWEL GREEN STERILE FF (TOWEL DISPOSABLE) ×6 IMPLANT
TRAY FOLEY SLVR 14FR TEMP STAT (SET/KITS/TRAYS/PACK) ×1 IMPLANT
TRAY FOLEY SLVR 16FR TEMP STAT (SET/KITS/TRAYS/PACK) ×4 IMPLANT
TUBING LAP HI FLOW INSUFFLATIO (TUBING) ×5 IMPLANT
UNDERPAD 30X36 HEAVY ABSORB (UNDERPADS AND DIAPERS) ×5 IMPLANT
WATER STERILE IRR 1000ML POUR (IV SOLUTION) ×10 IMPLANT

## 2022-03-17 NOTE — Anesthesia Postprocedure Evaluation (Signed)
Anesthesia Post Note ? ?Patient: Tanya Nolan ? ?Procedure(s) Performed: CORONARY ARTERY BYPASS GRAFTING X THREE, USING LEFT INTERNAL MAMMARY ARTERY, LEFT RADIAL ARTERY, RIGHT GREATER SAPHENOUS VEIN HARVESTED ENDOSCOPICALLY (Chest) ?TRANSESOPHAGEAL ECHOCARDIOGRAM (TEE) ?RADIAL ARTERY HARVEST (Left: Arm Lower) ? ?  ? ?Patient location during evaluation: PACU ?Anesthesia Type: General ?Level of consciousness: awake and alert ?Pain management: pain level controlled ?Vital Signs Assessment: post-procedure vital signs reviewed and stable ?Respiratory status: spontaneous breathing, nonlabored ventilation, respiratory function stable and patient connected to nasal cannula oxygen ?Cardiovascular status: blood pressure returned to baseline and stable ?Postop Assessment: no apparent nausea or vomiting ?Anesthetic complications: no ? ? ?No notable events documented. ? ?Last Vitals:  ?Vitals:  ? 03/17/22 1515 03/17/22 1530  ?BP:    ?Pulse: 71 70  ?Resp: 12 12  ?Temp: (!) 35.7 ?C (!) 35.8 ?C  ?SpO2: 100% 100%  ?  ?Last Pain:  ?Vitals:  ? 03/17/22 1400  ?TempSrc: Core  ?PainSc:   ? ? ?  ?  ?  ?  ?  ?  ? ?Effie Berkshire ? ? ? ? ?

## 2022-03-17 NOTE — Progress Notes (Signed)
?  Echocardiogram ?Echocardiogram Transesophageal has been performed. ? ?Tanya Nolan ?03/17/2022, 11:49 AM ?

## 2022-03-17 NOTE — Anesthesia Procedure Notes (Signed)
Central Venous Catheter Insertion ?Performed by: Effie Berkshire, MD, anesthesiologist ?Start/End5/03/2022 6:40 AM, 03/17/2022 6:50 AM ?Patient location: Pre-op. ?Preanesthetic checklist: patient identified, IV checked, site marked, risks and benefits discussed, surgical consent, monitors and equipment checked, pre-op evaluation, timeout performed and anesthesia consent ?Position: Trendelenburg ?Lidocaine 1% used for infiltration and patient sedated ?Hand hygiene performed , maximum sterile barriers used  and Seldinger technique used ?Catheter size: 9 Fr ?Total catheter length 10. ?Central line was placed.MAC introducer ?Swan type:thermodilution ?PA Cath depth:50 ?Procedure performed using ultrasound guided technique. ?Ultrasound Notes:anatomy identified, needle tip was noted to be adjacent to the nerve/plexus identified, no ultrasound evidence of intravascular and/or intraneural injection and image(s) printed for medical record ?Attempts: 1 ?Following insertion, line sutured and dressing applied. ?Post procedure assessment: blood return through all ports, free fluid flow and no air ? ?Patient tolerated the procedure well with no immediate complications. ? ? ? ? ?

## 2022-03-17 NOTE — Progress Notes (Signed)
Patient sats in the 80's for a brief moment. Recruitement maneuver performed on patient for one minute when sats returned to the 90's and then remained at 100%. Patient left on 100%, will continue to bring down gradually as able.  ?

## 2022-03-17 NOTE — Anesthesia Procedure Notes (Addendum)
Central Venous Catheter Insertion ?Performed by: Effie Berkshire, MD, anesthesiologist ?Start/End5/03/2022 6:50 AM, 03/17/2022 6:55 AM ?Patient location: Pre-op. ?Preanesthetic checklist: patient identified, IV checked, site marked, risks and benefits discussed, surgical consent, monitors and equipment checked, pre-op evaluation, timeout performed and anesthesia consent ?Hand hygiene performed  and maximum sterile barriers used  ?PA cath was placed.Swan type:thermodilution ?Procedure performed without using ultrasound guided technique. ?Attempts: 1 ?Patient tolerated the procedure well with no immediate complications. ? ? ? ?

## 2022-03-17 NOTE — Progress Notes (Signed)
Patient placed back on full support at this time due to VTs only in the 200's on CPAP/PS. Will attempt wean again once patient is more awake.  ?

## 2022-03-17 NOTE — Procedures (Signed)
Extubation Procedure Note ? ?Patient Details:   ?Name: Tanya Nolan ?DOB: Aug 22, 1976 ?MRN: [id number] ?  ?Airway Documentation:  ?  ?Vent end date: 03/17/22 Vent end time: 1900  ? ?Evaluation ? O2 sats: stable throughout ?Complications: No apparent complications ?Patient did tolerate procedure well. ?Bilateral Breath Sounds: Clear, Diminished ?  ?Yes ? ?Patient tolerated rapid wean. NIF -40, VC 1.06L. Positive for cuff leak. Patient extubated to a 4 Lpm nasal cannula. No signs of dyspnea or stridor noted. Patient instructed on the Incentive Spirometer achieving 787m three times. Patient resting comfortably. RN at bedside.  ? ?SMyrtie Neither?03/17/2022, 7:31 PM ? ?

## 2022-03-17 NOTE — Op Note (Signed)
?CARDIOVASCULAR SURGERY OPERATIVE NOTE ? ?03/17/2022 ? ?Surgeon:  [name], MD ? ?First Assistant: Lars Pinks, PA-C:   An experienced assistant was required given the complexity of this surgery and the standard of surgical care. The assistant was needed for endoscopic vein harvesting, exposure, dissection, suctioning, retraction of delicate tissues and sutures, instrument exchange and for overall help during this procedure. ? ? ?Preoperative Diagnosis:  Severe multi-vessel coronary artery disease ? ? ?Postoperative Diagnosis:  Same ? ? ?Procedure: ? ?Median Sternotomy ?Extracorporeal circulation ?3.   Coronary artery bypass grafting x 3 ? ?Left internal mammary artery graft to the LAD ?Left radial artery graft to the OM ?SVG to PDA ? ?4.   Endoscopic vein harvest from the right leg ? ? ?Anesthesia:  General Endotracheal ? ? ?Clinical History/Surgical Indication: ? ?The patient is a 46 year old woman with poorly controlled DM and HTN, smoking who reports about a one month hx of exertional chest pain and shortness of breath with mild activity as well as when laying down to go to bed. She has been tired with exertional fatigue. Now presents with NSTEMI and cath shows severe 3 vessel CAD. LAD and LCX are large vessels. RCA is occluded and the distal vessel fills by collaterals but I am not sure if it is graftable. I agree that CABG is the best treatment for this young, diabetic, smoker with 3 vessel disease. Upper extremity dopplers show that the left radial artery can be used. I discussed the operative procedure with the patient and family including alternatives, benefits and risks; including but not limited to bleeding, blood transfusion, infection, stroke, myocardial infarction, graft failure, heart block requiring a permanent pacemaker, organ dysfunction, and death.  Tanya Nolan understands and agrees to  proceed. ? ?Preparation: ? ?The patient was seen in the preoperative holding area and the correct patient, correct operation were confirmed with the patient after reviewing the medical record and catheterization. The consent was signed by me. Preoperative antibiotics were given. A pulmonary arterial line and radial arterial line were placed by the anesthesia team. The patient was taken back to the operating room and positioned supine on the operating room table. After being placed under general endotracheal anesthesia by the anesthesia team a foley catheter was placed. The neck, chest, abdomen, and both legs were prepped with betadine soap and solution and draped in the usual sterile manner. A surgical time-out was taken and the correct patient and operative procedure were confirmed with the nursing and anesthesia staff. ? ? ?Cardiopulmonary Bypass: ? ?A median sternotomy was performed. The pericardium was opened in the midline. Right ventricular function appeared normal. The ascending aorta was of normal size and had no palpable plaque. There were no contraindications to aortic cannulation or cross-clamping. The patient was fully systemically heparinized and the ACT was maintained > 400 sec. The proximal aortic arch was cannulated with a 20 F aortic cannula for arterial inflow. Venous cannulation was performed via the right atrial appendage using a two-staged venous cannula. An antegrade cardioplegia/vent cannula was inserted into the mid-ascending aorta. Aortic occlusion was performed with a single cross-clamp. Systemic cooling to 32 degrees Centigrade and topical cooling of the heart with iced saline were used. Hyperkalemic antegrade cold blood cardioplegia was used to induce diastolic arrest and was then given at about 20 minute intervals throughout the period of arrest to maintain myocardial temperature at or below 10 degrees centigrade. A temperature probe was inserted into the interventricular septum and an  insulating  pad was placed in the pericardium. ? ?Left radial artery harvest: ? ?The left radial artery was exposed through a longitudinal incision over the artery. It was harvested as a pedicle with the veins. Branches were divided and ligated with the harmonic scalpel. It was a small vessel but long and felt to be adequate. The artery was divided proximally and distally and the stumps suture ligated. It was flushed with papaverine saline. The large side branches were clipped. The would was closed in layers with 3-0 Vicryl subcutaneous suture and 3-0 Vicryl subcuticular skin closure. A dry sterile dressing was applied and the arm wrapped with an ACE wrap. It was placed back at the patient's side. ? ? ?Left internal mammary artery harvest: ? ?The left side of the sternum was retracted using the Rultract retractor. The left internal mammary artery was harvested as a pedicle graft. All side branches were clipped. It was a medium-sized vessel of good quality with excellent blood flow. It was ligated distally and divided. It was sprayed with topical papaverine solution to prevent vasospasm. ? ? ?Endoscopic vein harvest: ? ?The right greater saphenous vein was harvested endoscopically through a 2 cm incision medial to the right knee. It was harvested from the thigh. It was a medium-sized vein of good quality. The side branches were all ligated with 4-0 silk ties.  ? ? ?Coronary arteries: ? ?The coronary arteries were examined. ? ?LAD:  large vessel with minimal distal disease ?LCX:  OM was diseased proximally but the mid portion was free of disease and then it became intramyocardial. ?RCA:  small vessel that terminated in a PDA branch that was small but graftable. ? ? ?Grafts: ? ?LIMA to the LAD: 2.5 mm. It was sewn end to side using 8-0 prolene continuous suture. ?Left radial artery graft to the OM:  1.75 mm. It was sewn end to side using 8-0 prolene continuous suture. ?SVG to PDA:  1.6 mm. It was sewn end to side using  7-0 prolene continuous suture. ? ? ?The proximal vein graft anastomosis was performed to the mid-ascending aorta using continuous 6-0 prolene suture. The proximal end of the radial artery graft was anastomosed to the hood of the vein graft in end to side manner using continuous 7-0 prolene suture since the radial artery was too small to anastomose directed to the aorta. A graft markers was placed around the proximal anastomosis. ? ? ?Completion: ? ?The patient was rewarmed to 37 degrees Centigrade. The clamp was removed from the LIMA pedicle and there was rapid warming of the septum and return of ventricular fibrillation. The crossclamp was removed with a time of 78 minutes. There was spontaneous return of sinus rhythm. The distal and proximal anastomoses were checked for hemostasis. The position of the grafts was satisfactory. Two temporary epicardial pacing wires were placed on the right atrium and two on the right ventricle. The patient was weaned from CPB without difficulty on no inotropes. CPB time was 104 minutes. Cardiac output was 5 LPM. TEE showed normal LV systolic function with moderate concentric LVH. Heparin was fully reversed with protamine and the aortic and venous cannulas removed. Hemostasis was achieved. Mediastinal and left pleural drainage tubes were placed. The sternum was closed with double #6 stainless steel wires. The fascia was closed with continuous # 1 vicryl suture. The subcutaneous tissue was closed with 2-0 vicryl continuous suture. The skin was closed with 3-0 vicryl subcuticular suture. All sponge, needle, and instrument counts were reported correct at the end  of the case. Dry sterile dressings were placed over the incisions and around the chest tubes which were connected to pleurevac suction. The patient was then transported to the surgical intensive care unit in stable condition. ? ?  ?

## 2022-03-17 NOTE — Progress Notes (Signed)
Patient ID: Tanya Nolan, female   DOB: 07/17/1976, 46 y.o.   MRN: [id number] ? ?TCTS Evening Rounds:  ? ?Hemodynamically stable  ?CI = 2.2 ? ?Has started to wake up on vent. Still sleepy ? ?Urine output good  ?CT output low ? ?CBC ?   ?Component Value Date/Time  ? WBC 15.4 (H) 03/17/2022 1356  ? RBC 3.66 (L) 03/17/2022 1356  ? HGB 10.9 (L) 03/17/2022 1356  ? HCT 32.4 (L) 03/17/2022 1356  ? PLT 171 03/17/2022 1356  ? MCV 88.5 03/17/2022 1356  ? MCH 29.8 03/17/2022 1356  ? MCHC 33.6 03/17/2022 1356  ? RDW 14.0 03/17/2022 1356  ? LYMPHSABS 3.4 03/13/2022 0015  ? MONOABS 1.2 (H) 03/13/2022 0015  ? EOSABS 0.2 03/13/2022 0015  ? BASOSABS 0.0 03/13/2022 0015  ? ? ? ?BMET ?   ?Component Value Date/Time  ? NA 138 03/17/2022 1223  ? NA 142 05/17/2021 1013  ? K 4.5 03/17/2022 1223  ? CL 102 03/17/2022 1221  ? CO2 23 03/17/2022 0613  ? GLUCOSE 158 (H) 03/17/2022 1221  ? BUN 7 03/17/2022 1221  ? BUN 11 05/17/2021 1013  ? CREATININE 0.70 03/17/2022 1221  ? CREATININE 0.74 09/19/2016 1201  ? CALCIUM 9.0 03/17/2022 0613  ? EGFR 75 05/17/2021 1013  ? GFRNONAA >60 03/17/2022 7127  ? ? ? ?A/P:  Stable postop course. Continue current plans ? ?

## 2022-03-17 NOTE — Discharge Summary (Signed)
Physician Discharge Summary  ? ? ?   ?[address] ?      [place] [address] ?            [phone]   ? ?Patient ID: ?Tanya Nolan ?MRN: [id number] ?DOB/AGE: [birth year] 46 y.o. ? ?Admit date: 03/13/2022 ?Discharge date: 03/21/2022 ? ?Admission Diagnoses: ?Non-ST elevation (NSTEMI) myocardial infarction [facility]) ?2. Coronary artery disease ? ?Discharge Diagnoses:  ?S/p CABG x 3 ?2. Expected post op blood loss anemia ?3. History of the following: ? Abdominal pain 03/26/2009  ?  Qualifier: Diagnosis of  By: Jerline Pain MD, Anderson Malta    ? Anxiety    ? BACK STRAIN, LUMBAR 03/26/2009  ?  Qualifier: Diagnosis of  By: Jerline Pain MD, Anderson Malta    ? Chalazion 10/01/2007  ?  Qualifier: Diagnosis of  By: Genene Churn MD, Janett Billow    ? Chronic back pain    ?  tx with ibuprofen  ? Diabetes mellitus   ? ?Hyperlipidemia     ?  no meds - tx with diet  ? Hypertension    ? Hypothyroidism    ? Ingrown toenail 12/25/2014  ? 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  C&B WNL  NEG PAPS after  ? MDD (major depressive disorder), recurrent severe, without psychosis ([place]) 03/15/2020  ? Nipple discharge 10/13/2013  ? Nodule of skin of left foot 04/15/2021  ? Non compliance w medication regimen 10/13/2013  ? 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  BENIGN SECRETORY 02/2007  ? Thyroid dysfunction   ? ? ? ?Consults: None ? ?Procedure (s):  ?Median Sternotomy ?Extracorporeal circulation ?3.   Coronary artery bypass grafting x 3 ?  ?Left internal mammary artery graft to the LAD ?Left radial artery graft to the OM ?SVG to PDA ?  ?4.   Endoscopic vein harvest from the right leg by Dr. Cyndia Bent on 03/17/2022. ? ?HPI: ?This is a 46 year old AA female with a past medical history of hypertension, hyperlipidemia, DM, tobacco abuse, chronic back pain, hypothyroidism, PCOS, obesity, and MDD who presented to [facility] ED with complaints of chest pain, "seizure like activity" and going unresponsive. Patient was having intercourse with a friend, when  she had an episode of chest pain and had a "seizure". She also apparently bit her tongue and had urinary incontinence. EKG in ED showed normal sinus rhythm. She also had an EEG and MRI which were negative for an acute process. Upon further questioning, patient endorsed chest pain and DOE for about the past month as well as palpitations. Initial Troponin I was 49 and max was 146. Echo showed LVEF 60-65% and no significant valvular disease. 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 She then underwent a cardiac catheterization earlier today which showed proximal and mid LAD 80% stenosed, proximal to mid Circumflex 80% stenosed , OM 2 80% stenosed, and proximal RCA 100% stenosed. Dr. Cyndia Bent has been consulted for the consideration of coronary artery bypass grafting surgery. ?Dr. Cyndia Bent reviewed all studies and recommended coronary artery bypass grafting surgery. Potential risks, benefits, and complications of the surgery were discussed with the patient and she agreed to proceed with surgery. Pre operative carotid duplex showed no significant internal carotid artery stenosis bilaterally. ? ?Hospital Course: ?Patient underwent a CABG x 3. She was transported from the OR to [facility] ICU in  stable condition.  ?Vital signs and hemodynamics remained stable.  She was weaned from the ventilator and extubated per protocol by 7:30 PM on the day of surgery.  On the first postoperative day, she had remained in sinus rhythm overnight and Neo-Synephrine has been weaned off.  She was started on Norvasc 2.5 mg daily for the radial artery graft.  Chest tubes, PA catheter, and arterial line were all removed routinely on postop day 1.  She was mobilized and made good progress. By the second postoperative day, she was ambulating around the unit.  She had remained in a stable sinus rhythm.  She was  started on Lasix for expected volume excess and had good response with appropriate diuresis.  Foley catheter were removed on postop day 2 and she was transferred to 4E Progressive Care. ?She has been on room air with good oxygenation. All wounds are clean, dry, healing without sings of infection. Slight bloody ooze from right "stab" wound. LUE motor/sensory intact. Glucose is well controlled. Jardiance to be restarted at discharge. Of note, she was not on Insulin prior to admission. She will need close medical follow up after discharge for further diabetes management and surveillance of HGA1C.  EPW were removed 05/08. She was then started on Plavix and ec asa was decreased to 81 mg daily on 05/09 for NSTEMI on presentation. She has been tolerating a diet and has had no further nausea. She has had a bowel movement. Chest tube sutures will be removed in the office after discharge. As discussed with Dr. Cyndia Bent, she is felt surgically stable for discharge today. ? ?Latest Vital Signs: ?Blood pressure 127/64, pulse 75, temperature 98 ?F (36.7 ?C), temperature source Oral, resp. rate 20, height '5\' 7"'$  (1.702 m), weight 111.3 kg, SpO2 94 %. ? ?Physical Exam: ?Cardiovascular: RRR ?Pulmonary: Clear to auscultation bilaterally ?Abdomen: Soft, non tender, bowel sounds present. ?Extremities: Mild bilateral lower extremity edema. LUE motor/sensory intact ?Wounds: Sternal, LUE, and RLE wounds are clean and dry.No erythema or signs of infection. ?  ? ?Discharge Condition:Stable and discharged to home. ? ?Recent laboratory studies:  ?Lab Results  ?Component Value Date  ? WBC 13.5 (H) 03/19/2022  ? HGB 9.2 (L) 03/19/2022  ? HCT 29.4 (L) 03/19/2022  ? MCV 91.9 03/19/2022  ? PLT 176 03/19/2022  ? ?Lab Results  ?Component Value Date  ? NA 138 03/19/2022  ? K 4.4 03/19/2022  ? CL 107 03/19/2022  ? CO2 25 03/19/2022  ? CREATININE 0.79 03/19/2022  ? GLUCOSE 116 (H) 03/19/2022  ? ? ?  ?Diagnostic Studies: DG Chest 2 View ? ?Result Date:  03/20/2022 ?CLINICAL DATA:  Pleural effusion status post CABG EXAM: CHEST - 2 VIEW COMPARISON:  Chest radiograph 1 day prior FINDINGS: The right IJ vascular sheath has been removed. Median sternotomy wires are unchanged. The cardiomediastinal silhouette is stable. Aeration of the lungs is improved, with decreased bilateral pleural effusions and decreased opacities in the lung bases. There is no new or worsening focal airspace disease. There is no pneumothorax There is no acute osseous abnormality. IMPRESSION: Decreased pleural effusions with improved aeration of the lung bases. Electronically Signed   By: Valetta Mole M.D.   On: 03/20/2022 09:44  ? ?MR BRAIN WO CONTRAST ? ?Result Date: 03/13/2022 ?CLINICAL DATA:  New onset seizure. EXAM: MRI HEAD WITHOUT CONTRAST TECHNIQUE: Multiplanar, multiecho pulse sequences of the brain and surrounding structures were obtained without intravenous contrast. COMPARISON:  Head CT 10/22/2021 FINDINGS: Brain: Dedicated thin section imaging through the  temporal lobes demonstrates normal volume and signal of the hippocampi. There is no evidence of heterotopia or cortical dysplasia. There is no evidence of an acute infarct, intracranial hemorrhage, mass, midline shift, or extra-axial fluid collection. The ventricles and sulci are normal. The brain is normal in signal. The cerebellar tonsils are normally positioned. The pituitary gland is upper limits of normal in size for a female of this age and grossly unchanged. Vascular: Major intracranial vascular flow voids are preserved. Skull and upper cervical spine: Unremarkable bone marrow signal. Sinuses/Orbits: Unremarkable orbits. Paranasal sinuses and mastoid air cells are clear. Other: None. IMPRESSION: Negative head MRI. Electronically Signed   By: Logan Bores M.D.   On: 03/13/2022 20:37  ? ?CARDIAC CATHETERIZATION ? ?Result Date: 03/15/2022 ?  Prox RCA lesion is 100% stenosed.   Prox Cx to Mid Cx lesion is 80% stenosed.   2nd Mrg lesion is  90% stenosed.   Prox LAD lesion is 80% stenosed.   Mid LAD lesion is 80% stenosed. Severe triple vessel CAD Severe proximal and mid LAD stenosis Severe mid Circumflex. Severe obtuse marginal stenosis Th

## 2022-03-17 NOTE — Transfer of Care (Signed)
Immediate Anesthesia Transfer of Care Note ? ?Patient: Tanya Nolan ? ?Procedure(s) Performed: CORONARY ARTERY BYPASS GRAFTING X THREE, USING LEFT INTERNAL MAMMARY ARTERY, LEFT RADIAL ARTERY, RIGHT GREATER SAPHENOUS VEIN HARVESTED ENDOSCOPICALLY (Chest) ?TRANSESOPHAGEAL ECHOCARDIOGRAM (TEE) ?RADIAL ARTERY HARVEST (Left: Arm Lower) ? ?Patient Location: ICU ? ?Anesthesia Type:General ? ?Level of Consciousness: Patient remains intubated per anesthesia plan ? ?Airway & Oxygen Therapy: Patient remains intubated per anesthesia plan and Patient placed on Ventilator (see vital sign flow sheet for setting) ? ?Post-op Assessment: Report given to RN and Post -op Vital signs reviewed and stable ? ?Post vital signs: Reviewed and stable ? ?Last Vitals:  ?Vitals Value Taken Time  ?BP 115/80   ?Temp    ?Pulse 80 03/17/22 1340  ?Resp 12 03/17/22 1340  ?SpO2 95 % 03/17/22 1340  ?Vitals shown include unvalidated device data. ? ?Last Pain:  ?Vitals:  ? 03/17/22 0412  ?TempSrc: Oral  ?PainSc:   ?   ? ?Patients Stated Pain Goal: 0 (03/16/22 2215) ? ?Complications: No notable events documented. ?

## 2022-03-17 NOTE — Interval H&P Note (Signed)
History and Physical Interval Note: ? ?03/17/2022 ?7:06 AM ? ?Tanya Nolan  has presented today for surgery, with the diagnosis of CAD.  The various methods of treatment have been discussed with the patient and family. After consideration of risks, benefits and other options for treatment, the patient has consented to  Procedure(s): ?CORONARY ARTERY BYPASS GRAFTING (CABG) (N/A) ?TRANSESOPHAGEAL ECHOCARDIOGRAM (TEE) (N/A) ?RADIAL ARTERY HARVEST (Left) as a surgical intervention.  The patient's history has been reviewed, patient examined, no change in status, stable for surgery.  I have reviewed the patient's chart and labs.  Questions were answered to the patient's satisfaction.   ? ? ?[name] ? ? ?

## 2022-03-17 NOTE — Anesthesia Procedure Notes (Signed)
Arterial Line Insertion ?Start/End5/03/2022 7:00 AM, 03/17/2022 7:05 AM ?Performed by: Effie Berkshire, MD ? Patient location: Pre-op. ?Preanesthetic checklist: patient identified, IV checked, site marked, risks and benefits discussed, surgical consent, monitors and equipment checked, pre-op evaluation, timeout performed and anesthesia consent ?Lidocaine 1% used for infiltration ?Right, radial was placed ?Catheter size: 20 G ?Hand hygiene performed  and maximum sterile barriers used  ? ?Attempts: 2 ?Procedure performed without using ultrasound guided technique. ?Following insertion, dressing applied and Biopatch. ?Post procedure assessment: normal and unchanged ? ?Post procedure complications: second provider assisted. ?Patient tolerated the procedure well with no immediate complications. ?Additional procedure comments: MDA x1. ? ? ? ?

## 2022-03-17 NOTE — Progress Notes (Signed)
FPTS Brief Progress Note ? ?S:Went to room to check on patient, patient sleeping comfortably. Did not disturb.  ? ? ?O: ?BP (!) 157/67 (BP Location: Left Arm)   Pulse 90   Temp 98 ?F (36.7 ?C) (Oral)   Resp 18   Ht '5\' 7"'$  (1.702 m)   Wt 97.5 kg   SpO2 100%   BMI 33.67 kg/m?   ? ? ?A/P: ?- Plans per day team ?- Orders reviewed. Labs for AM ordered, which was adjusted as needed.  ? ? ?Holley Bouche, MD ?03/17/2022, 3:18 AM ?PGY-1, [facility] Night Resident  ?Please page [phone] with questions.  ? ? ?

## 2022-03-17 NOTE — Anesthesia Preprocedure Evaluation (Addendum)
Anesthesia Evaluation  ?Patient identified by MRN, date of birth, ID band ?Patient awake ? ? ? ?Reviewed: ?Allergy & Precautions, NPO status , Patient's Chart, lab work & pertinent test results ? ?Airway ?Mallampati: II ? ?TM Distance: >3 FB ?Neck ROM: Full ? ? ? Dental ? ?(+) Teeth Intact, Dental Advisory Given ?  ?Pulmonary ?Current Smoker,  ?  ?breath sounds clear to auscultation ? ? ? ? ? ? Cardiovascular ?hypertension, + CAD and + Past MI  ? ?Rhythm:Regular Rate:Normal ? ?Echo: ?1. Left ventricular ejection fraction, by estimation, is 60 to 65%. The  ?left ventricle has normal function. The left ventricle has no regional  ?wall motion abnormalities. There is moderate concentric left ventricular  ?hypertrophy. Left ventricular  ?diastolic parameters are indeterminate.  ??2. Right ventricular systolic function is normal. The right ventricular  ?size is normal. There is normal pulmonary artery systolic pressure.  ??3. The mitral valve is normal in structure. Trivial mitral valve  ?regurgitation. No evidence of mitral stenosis.  ??4. The aortic valve is tricuspid. Aortic valve regurgitation is not  ?visualized. No aortic stenosis is present.  ??5. The inferior vena cava is dilated in size with <50% respiratory  ?variability, suggesting right atrial pressure of 15 mmHg.  ?  ?Neuro/Psych ?PSYCHIATRIC DISORDERS Anxiety Depression   ? GI/Hepatic ?negative GI ROS, Neg liver ROS,   ?Endo/Other  ?diabetes ? Renal/GU ?  ? ?  ?Musculoskeletal ? ? Abdominal ?Normal abdominal exam  (+)   ?Peds ? Hematology ?  ?Anesthesia Other Findings ? ? Reproductive/Obstetrics ? ?  ? ? ? ? ? ? ? ? ? ? ? ? ? ?  ?  ? ? ? ? ? ? ? ?Anesthesia Physical ?Anesthesia Plan ? ?ASA: 4 ? ?Anesthesia Plan: General  ? ?Post-op Pain Management:   ? ?Induction: Intravenous ? ?PONV Risk Score and Plan: 1 and Ondansetron ? ?Airway Management Planned: Oral ETT ? ?Additional Equipment: Arterial line, CVP, PA Cath, TEE and  Ultrasound Guidance Line Placement ? ?Intra-op Plan:  ? ?Post-operative Plan: Post-operative intubation/ventilation ? ?Informed Consent: I have reviewed the patients History and Physical, chart, labs and discussed the procedure including the risks, benefits and alternatives for the proposed anesthesia with the patient or authorized representative who has indicated his/her understanding and acceptance.  ? ? ? ?Dental advisory given ? ?Plan Discussed with: CRNA ? ?Anesthesia Plan Comments:   ? ? ? ? ? ?Anesthesia Quick Evaluation ? ?

## 2022-03-17 NOTE — Brief Op Note (Signed)
03/13/2022 - 03/17/2022 ? ?12:14 PM ? ?PATIENT:  Tanya Nolan  46 y.o. female ? ?PRE-OPERATIVE DIAGNOSIS:  1. S/p NSTEMI 2.Coronary artery disease ? ?POST-OPERATIVE DIAGNOSIS:  1. S/p NSTEMI 2.Coronary artery disease ? ?PROCEDURE: TRANSESOPHAGEAL ECHOCARDIOGRAM (TEE), CORONARY ARTERY BYPASS GRAFTING X THREE (LIMA to LAD, LEFT RADIAL ARTERY to Om, SVG to PDA) USING LEFT INTERNAL MAMMARY ARTERY, LEFT RADIAL ARTERY, RIGHT GREATER SAPHENOUS THIGH VEIN HARVESTED ENDOSCOPICALLY  ?Left radial artery harvest time: 35 minutes;Left radial artery prep time: 7 minutes ?Vein harvest time: 18 minutes Vein prep time: 10 minutes ? ?SURGEON:  Surgeon(s) and Role: ?   [name], MD - Primary ? ?PHYSICIAN ASSISTANT: Lars Pinks PA-C ? ?ASSISTANTS: Ardyth Man RNFA  ? ?ANESTHESIA:   general ? ?EBL: Per anesthesia, perfusion record ? ?DRAINS:  Chest tubs placed in the mediastinal and pleural spaces   ? ?COUNTS CORRECT:  YES ? ?DICTATION: .Dragon Dictation ? ?PLAN OF CARE: Admit to inpatient  ? ?PATIENT DISPOSITION:  ICU - intubated and hemodynamically stable. ?  ?Delay start of Pharmacological VTE agent (>24hrs) due to surgical blood loss or risk of bleeding: no ? ?BASELINE WEIGHT: 97.5 kg ?

## 2022-03-17 NOTE — Anesthesia Procedure Notes (Signed)
Procedure Name: Intubation ?Date/Time: 03/17/2022 7:49 AM ?Performed by: Erick Colace, CRNA ?Pre-anesthesia Checklist: Patient identified, Emergency Drugs available, Suction available and Patient being monitored ?Patient Re-evaluated:Patient Re-evaluated prior to induction ?Oxygen Delivery Method: Circle system utilized ?Preoxygenation: Pre-oxygenation with 100% oxygen ?Induction Type: IV induction ?Ventilation: Mask ventilation without difficulty and Oral airway inserted - appropriate to patient size ?Laryngoscope Size: Glidescope and 3 ?Grade View: Grade I ?Tube type: Oral ?Tube size: 8.0 mm ?Number of attempts: 1 ?Airway Equipment and Method: Stylet and Oral airway ?Placement Confirmation: ETT inserted through vocal cords under direct vision, positive ETCO2 and breath sounds checked- equal and bilateral ?Secured at: 23 cm ?Tube secured with: Tape ?Dental Injury: Teeth and Oropharynx as per pre-operative assessment  ?Difficulty Due To: Difficulty was anticipated, Difficult Airway- due to large tongue, Difficult Airway- due to reduced neck mobility and Difficult Airway- due to limited oral opening ? ? ? ? ?

## 2022-03-18 ENCOUNTER — Inpatient Hospital Stay (HOSPITAL_COMMUNITY): Payer: Medicaid Other

## 2022-03-18 LAB — CBC
HCT: 29.9 % — ABNORMAL LOW (ref 36.0–46.0)
HCT: 29.1 % — ABNORMAL LOW (ref 36.0–46.0)
Hemoglobin: 10 g/dL — ABNORMAL LOW (ref 12.0–15.0)
Hemoglobin: 9.2 g/dL — ABNORMAL LOW (ref 12.0–15.0)
MCH: 29.9 pg (ref 26.0–34.0)
MCH: 29.3 pg (ref 26.0–34.0)
MCHC: 33.4 g/dL (ref 30.0–36.0)
MCHC: 31.6 g/dL (ref 30.0–36.0)
MCV: 89.5 fL (ref 80.0–100.0)
MCV: 92.7 fL (ref 80.0–100.0)
Platelets: 167 10*3/uL (ref 150–400)
Platelets: 176 10*3/uL (ref 150–400)
RBC: 3.34 MIL/uL — ABNORMAL LOW (ref 3.87–5.11)
RBC: 3.14 MIL/uL — ABNORMAL LOW (ref 3.87–5.11)
RDW: 14.2 % (ref 11.5–15.5)
RDW: 14.6 % (ref 11.5–15.5)
WBC: 14.9 10*3/uL — ABNORMAL HIGH (ref 4.0–10.5)
WBC: 14.1 10*3/uL — ABNORMAL HIGH (ref 4.0–10.5)
nRBC: 0 % (ref 0.0–0.2)
nRBC: 0 % (ref 0.0–0.2)

## 2022-03-18 LAB — BASIC METABOLIC PANEL
Anion gap: 6 (ref 5–15)
Anion gap: 6 (ref 5–15)
BUN: 7 mg/dL (ref 6–20)
BUN: 10 mg/dL (ref 6–20)
CO2: 22 mmol/L (ref 22–32)
CO2: 23 mmol/L (ref 22–32)
Calcium: 8.2 mg/dL — ABNORMAL LOW (ref 8.9–10.3)
Calcium: 8.3 mg/dL — ABNORMAL LOW (ref 8.9–10.3)
Chloride: 111 mmol/L (ref 98–111)
Chloride: 105 mmol/L (ref 98–111)
Creatinine, Ser: 0.7 mg/dL (ref 0.44–1.00)
Creatinine, Ser: 0.86 mg/dL (ref 0.44–1.00)
GFR, Estimated: >60 mL/min (ref >60)
GFR, Estimated: >60 mL/min (ref >60)
Glucose, Bld: 104 mg/dL — ABNORMAL HIGH (ref 70–99)
Glucose, Bld: 157 mg/dL — ABNORMAL HIGH (ref 70–99)
Potassium: 4.3 mmol/L (ref 3.5–5.1)
Potassium: 3.9 mmol/L (ref 3.5–5.1)
Sodium: 139 mmol/L (ref 135–145)
Sodium: 134 mmol/L — ABNORMAL LOW (ref 135–145)

## 2022-03-18 LAB — POCT I-STAT 7, (LYTES, BLD GAS, ICA,H+H)
Acid-base deficit: 2 mmol/L (ref 0.0–2.0)
Acid-base deficit: 3 mmol/L — ABNORMAL HIGH (ref 0.0–2.0)
Acid-base deficit: 3 mmol/L — ABNORMAL HIGH (ref 0.0–2.0)
Bicarbonate: 23.6 mmol/L (ref 20.0–28.0)
Bicarbonate: 22.8 mmol/L (ref 20.0–28.0)
Bicarbonate: 23.5 mmol/L (ref 20.0–28.0)
Calcium, Ion: 1.24 mmol/L (ref 1.15–1.40)
Calcium, Ion: 1.22 mmol/L (ref 1.15–1.40)
Calcium, Ion: 1.22 mmol/L (ref 1.15–1.40)
HCT: 33 % — ABNORMAL LOW (ref 36.0–46.0)
HCT: 28 % — ABNORMAL LOW (ref 36.0–46.0)
HCT: 30 % — ABNORMAL LOW (ref 36.0–46.0)
Hemoglobin: 11.2 g/dL — ABNORMAL LOW (ref 12.0–15.0)
Hemoglobin: 9.5 g/dL — ABNORMAL LOW (ref 12.0–15.0)
Hemoglobin: 10.2 g/dL — ABNORMAL LOW (ref 12.0–15.0)
O2 Saturation: 96 %
O2 Saturation: 99 %
O2 Saturation: 99 %
Patient temperature: 36
Patient temperature: 37.3
Patient temperature: 37.4
Potassium: 3.8 mmol/L (ref 3.5–5.1)
Potassium: 4.7 mmol/L (ref 3.5–5.1)
Potassium: 4.6 mmol/L (ref 3.5–5.1)
Sodium: 141 mmol/L (ref 135–145)
Sodium: 141 mmol/L (ref 135–145)
Sodium: 142 mmol/L (ref 135–145)
TCO2: 25 mmol/L (ref 22–32)
TCO2: 24 mmol/L (ref 22–32)
TCO2: 25 mmol/L (ref 22–32)
pCO2 arterial: 42.7 mmHg (ref 32–48)
pCO2 arterial: 46.2 mmHg (ref 32–48)
pCO2 arterial: 51.4 mmHg — ABNORMAL HIGH (ref 32–48)
pH, Arterial: 7.347 — ABNORMAL LOW (ref 7.35–7.45)
pH, Arterial: 7.302 — ABNORMAL LOW (ref 7.35–7.45)
pH, Arterial: 7.27 — ABNORMAL LOW (ref 7.35–7.45)
pO2, Arterial: 80 mmHg — ABNORMAL LOW (ref 83–108)
pO2, Arterial: 143 mmHg — ABNORMAL HIGH (ref 83–108)
pO2, Arterial: 164 mmHg — ABNORMAL HIGH (ref 83–108)

## 2022-03-18 LAB — GLUCOSE, CAPILLARY
Glucose-Capillary: 100 mg/dL — ABNORMAL HIGH (ref 70–99)
Glucose-Capillary: 102 mg/dL — ABNORMAL HIGH (ref 70–99)
Glucose-Capillary: 102 mg/dL — ABNORMAL HIGH (ref 70–99)
Glucose-Capillary: 106 mg/dL — ABNORMAL HIGH (ref 70–99)
Glucose-Capillary: 106 mg/dL — ABNORMAL HIGH (ref 70–99)
Glucose-Capillary: 112 mg/dL — ABNORMAL HIGH (ref 70–99)
Glucose-Capillary: 112 mg/dL — ABNORMAL HIGH (ref 70–99)
Glucose-Capillary: 119 mg/dL — ABNORMAL HIGH (ref 70–99)
Glucose-Capillary: 150 mg/dL — ABNORMAL HIGH (ref 70–99)
Glucose-Capillary: 177 mg/dL — ABNORMAL HIGH (ref 70–99)
Glucose-Capillary: 203 mg/dL — ABNORMAL HIGH (ref 70–99)

## 2022-03-18 LAB — MAGNESIUM
Magnesium: 2.1 mg/dL (ref 1.7–2.4)
Magnesium: 2 mg/dL (ref 1.7–2.4)

## 2022-03-18 MED ORDER — INSULIN DETEMIR 100 UNIT/ML ~~LOC~~ SOLN
30.0000 [IU] | Freq: Every day | SUBCUTANEOUS | Status: DC
  Administered 2022-03-18 – 2022-03-21 (×4): 30 [IU] via SUBCUTANEOUS
  Filled 2022-03-18 (×4): qty 0.3

## 2022-03-18 MED ORDER — POTASSIUM CHLORIDE 20 MEQ PO PACK: 20.0000 meq | PACK | ORAL | Status: DC

## 2022-03-18 MED ORDER — POTASSIUM CHLORIDE CRYS ER 20 MEQ PO TBCR
20.0000 meq | EXTENDED_RELEASE_TABLET | ORAL | Status: AC
  Administered 2022-03-18 – 2022-03-19 (×3): 20 meq via ORAL
  Filled 2022-03-18 (×3): qty 1

## 2022-03-18 MED ORDER — AMLODIPINE BESYLATE 5 MG PO TABS
2.5000 mg | ORAL_TABLET | Freq: Every day | ORAL | Status: DC
Start: 2022-03-18 — End: 2022-03-21
  Administered 2022-03-18 – 2022-03-21 (×4): 2.5 mg via ORAL
  Filled 2022-03-18 (×4): qty 1

## 2022-03-18 MED ORDER — ALBUMIN HUMAN 5 % IV SOLN
INTRAVENOUS | Status: AC
  Administered 2022-03-18: 12.5 g via INTRAVENOUS
  Filled 2022-03-18: qty 250

## 2022-03-18 MED ORDER — ENOXAPARIN SODIUM 40 MG/0.4ML IJ SOSY
40.0000 mg | PREFILLED_SYRINGE | Freq: Every day | INTRAMUSCULAR | Status: DC
  Administered 2022-03-18 – 2022-03-20 (×3): 40 mg via SUBCUTANEOUS
  Filled 2022-03-18 (×3): qty 0.4

## 2022-03-18 MED ORDER — FUROSEMIDE 10 MG/ML IJ SOLN
40.0000 mg | Freq: Once | INTRAMUSCULAR | Status: AC
  Administered 2022-03-18: 40 mg via INTRAVENOUS
  Filled 2022-03-18: qty 4

## 2022-03-18 MED ORDER — INSULIN ASPART 100 UNIT/ML IJ SOLN
0.0000 [IU] | INTRAMUSCULAR | Status: DC
  Administered 2022-03-18: 4 [IU] via SUBCUTANEOUS
  Administered 2022-03-18: 2 [IU] via SUBCUTANEOUS
  Administered 2022-03-19: 8 [IU] via SUBCUTANEOUS
  Administered 2022-03-19 (×2): 2 [IU] via SUBCUTANEOUS

## 2022-03-18 MED ORDER — INSULIN DETEMIR 100 UNIT/ML ~~LOC~~ SOLN: 30.0000 [IU] | Freq: Every day | SUBCUTANEOUS | Status: DC

## 2022-03-18 NOTE — Progress Notes (Signed)
Patient ID: Tanya Nolan, female   DOB: 07/24/1976, 46 y.o.   MRN: [id number] ?TCTS Evening Rounds: ? ?Hemodynamically stable in sinus rhythm 78 ? ?Chest tubes out. ? ?Walked ? ?UO ok. Will give a dose of lasix tonight. ? ?BMET ?   ?Component Value Date/Time  ? NA 134 (L) 03/18/2022 1703  ? NA 142 05/17/2021 1013  ? K 3.9 03/18/2022 1703  ? CL 105 03/18/2022 1703  ? CO2 23 03/18/2022 1703  ? GLUCOSE 157 (H) 03/18/2022 1703  ? BUN 10 03/18/2022 1703  ? BUN 11 05/17/2021 1013  ? CREATININE 0.86 03/18/2022 1703  ? CREATININE 0.74 09/19/2016 1201  ? CALCIUM 8.3 (L) 03/18/2022 1703  ? EGFR 75 05/17/2021 1013  ? GFRNONAA >60 03/18/2022 1703  ? ?CBC ?   ?Component Value Date/Time  ? WBC 14.1 (H) 03/18/2022 1703  ? RBC 3.14 (L) 03/18/2022 1703  ? HGB 9.2 (L) 03/18/2022 1703  ? HCT 29.1 (L) 03/18/2022 1703  ? PLT 176 03/18/2022 1703  ? MCV 92.7 03/18/2022 1703  ? MCH 29.3 03/18/2022 1703  ? MCHC 31.6 03/18/2022 1703  ? RDW 14.6 03/18/2022 1703  ? LYMPHSABS 3.4 03/13/2022 0015  ? MONOABS 1.2 (H) 03/13/2022 0015  ? EOSABS 0.2 03/13/2022 0015  ? BASOSABS 0.0 03/13/2022 0015  ? ? ?

## 2022-03-18 NOTE — Progress Notes (Signed)
1 Day Post-Op Procedure(s) (LRB): ?CORONARY ARTERY BYPASS GRAFTING X THREE, USING LEFT INTERNAL MAMMARY ARTERY, LEFT RADIAL ARTERY, RIGHT GREATER SAPHENOUS VEIN HARVESTED ENDOSCOPICALLY (N/A) ?TRANSESOPHAGEAL ECHOCARDIOGRAM (TEE) (N/A) ?RADIAL ARTERY HARVEST (Left) ?Subjective: ?No complaints. ? ?Objective: ?Vital signs in last 24 hours: ?Temp:  [96.3 ?F (35.7 ?C)-99.7 ?F (37.6 ?C)] 99.1 ?F (37.3 ?C) (05/06 0700) ?Pulse Rate:  [69-85] 82 (05/06 0630) ?Cardiac Rhythm: Normal sinus rhythm (05/05 1915) ?Resp:  [0-31] 0 (05/06 0700) ?BP: (85-150)/(57-85) 85/57 (05/05 1833) ?SpO2:  [88 %-100 %] 100 % (05/06 0700) ?Arterial Line BP: (83-152)/(46-86) 103/53 (05/06 0700) ?FiO2 (%):  [40 %-100 %] 40 % (05/05 1833) ? ?Hemodynamic parameters for last 24 hours: ?PAP: (21-34)/(9-23) 25/14 ?CO:  [4.2 L/min-6.1 L/min] 5.3 L/min ?CI:  [2 L/min/m2-2.9 L/min/m2] 2.6 L/min/m2 ? ?Intake/Output from previous day: ?05/05 0701 - 05/06 0700 ?In: 4287 [I.V.:2869.6; Blood:250; IV Piggyback:1251.5] ?Out: 6811 [Urine:3380; Blood:385; Chest Tube:440] ?Intake/Output this shift: ?No intake/output data recorded. ? ?General appearance: alert and cooperative ?Neurologic: intact ?Heart: regular rate and rhythm, S1, S2 normal, no murmur ?Lungs: clear to auscultation bilaterally ?Extremities: edema minimal ?Wound: dressing dry ?Left hand warm and well-perfused ? ?Lab Results: ?Recent Labs  ?  03/17/22 ?1936 03/18/22 ?0304  ?WBC 14.1* 14.9*  ?HGB 10.2*  9.7* 10.0*  ?HCT 30.0*  29.3* 29.9*  ?PLT 167 167  ? ?BMET:  ?Recent Labs  ?  03/17/22 ?1936 03/18/22 ?0304  ?NA 142  140 139  ?K 4.6  4.6 4.3  ?CL 109 111  ?CO2 23 22  ?GLUCOSE 103* 104*  ?BUN 7 7  ?CREATININE 0.74 0.70  ?CALCIUM 8.0* 8.2*  ?  ?PT/INR:  ?Recent Labs  ?  03/17/22 ?1356  ?LABPROT 16.3*  ?INR 1.3*  ? ?ABG ?   ?Component Value Date/Time  ? PHART 7.270 (L) 03/17/2022 1936  ? HCO3 23.5 03/17/2022 1936  ? TCO2 25 03/17/2022 1936  ? ACIDBASEDEF 3.0 (H) 03/17/2022 1936  ? O2SAT 99 03/17/2022  1936  ? ?CBG (last 3)  ?Recent Labs  ?  03/18/22 ?5726 03/18/22 ?0303 03/18/22 ?0441  ?GLUCAP 102* 102* 106*  ? ?CXR: mild atelectasis ? ?ECG: sinus rhythm, no acute changes ? ?Assessment/Plan: ?S/P Procedure(s) (LRB): ?CORONARY ARTERY BYPASS GRAFTING X THREE, USING LEFT INTERNAL MAMMARY ARTERY, LEFT RADIAL ARTERY, RIGHT GREATER SAPHENOUS VEIN HARVESTED ENDOSCOPICALLY (N/A) ?TRANSESOPHAGEAL ECHOCARDIOGRAM (TEE) (N/A) ?RADIAL ARTERY HARVEST (Left) ? ?POD  1 ?Hemodynamically stable in sinus rhythm. Still on low dose neo. Hold beta blocker until BP stable off neo.  ? ?Start Norvasc 2.5 for radial artery graft. Titrate up as BP requires. ? ?DC chest tubes, swan, arterial line ? ?DM: Did not have preop Hgb A1c but has been high in past and does not check CBG's at home. Will transition to [place] and Levemir. ? ?IS, OOB, mobilize. ? ? LOS: 3 days  ? ? ?[name] ?03/18/2022 ? ? ?

## 2022-03-19 ENCOUNTER — Inpatient Hospital Stay (HOSPITAL_COMMUNITY): Payer: Medicaid Other

## 2022-03-19 LAB — BASIC METABOLIC PANEL
Anion gap: 6 (ref 5–15)
BUN: 9 mg/dL (ref 6–20)
CO2: 25 mmol/L (ref 22–32)
Calcium: 8.6 mg/dL — ABNORMAL LOW (ref 8.9–10.3)
Chloride: 107 mmol/L (ref 98–111)
Creatinine, Ser: 0.79 mg/dL (ref 0.44–1.00)
GFR, Estimated: >60 mL/min (ref >60)
Glucose, Bld: 116 mg/dL — ABNORMAL HIGH (ref 70–99)
Potassium: 4.4 mmol/L (ref 3.5–5.1)
Sodium: 138 mmol/L (ref 135–145)

## 2022-03-19 LAB — CBC
HCT: 29.4 % — ABNORMAL LOW (ref 36.0–46.0)
Hemoglobin: 9.2 g/dL — ABNORMAL LOW (ref 12.0–15.0)
MCH: 28.8 pg (ref 26.0–34.0)
MCHC: 31.3 g/dL (ref 30.0–36.0)
MCV: 91.9 fL (ref 80.0–100.0)
Platelets: 176 10*3/uL (ref 150–400)
RBC: 3.2 MIL/uL — ABNORMAL LOW (ref 3.87–5.11)
RDW: 14.4 % (ref 11.5–15.5)
WBC: 13.5 10*3/uL — ABNORMAL HIGH (ref 4.0–10.5)
nRBC: 0 % (ref 0.0–0.2)

## 2022-03-19 LAB — GLUCOSE, CAPILLARY
Glucose-Capillary: 125 mg/dL — ABNORMAL HIGH (ref 70–99)
Glucose-Capillary: 150 mg/dL — ABNORMAL HIGH (ref 70–99)
Glucose-Capillary: 161 mg/dL — ABNORMAL HIGH (ref 70–99)
Glucose-Capillary: 123 mg/dL — ABNORMAL HIGH (ref 70–99)
Glucose-Capillary: 131 mg/dL — ABNORMAL HIGH (ref 70–99)

## 2022-03-19 MED ORDER — SODIUM CHLORIDE 0.9% FLUSH: 3.0000 mL | INTRAVENOUS | Status: DC | PRN

## 2022-03-19 MED ORDER — INSULIN ASPART 100 UNIT/ML IJ SOLN
0.0000 [IU] | Freq: Three times a day (TID) | INTRAMUSCULAR | Status: DC
  Administered 2022-03-19: 2 [IU] via SUBCUTANEOUS
  Administered 2022-03-19: 4 [IU] via SUBCUTANEOUS
  Administered 2022-03-19 – 2022-03-20 (×2): 2 [IU] via SUBCUTANEOUS
  Administered 2022-03-20: 4 [IU] via SUBCUTANEOUS
  Administered 2022-03-20: 2 [IU] via SUBCUTANEOUS

## 2022-03-19 MED ORDER — ASPIRIN EC 325 MG PO TBEC: 325.0000 mg | DELAYED_RELEASE_TABLET | Freq: Every day | ORAL | Status: DC

## 2022-03-19 MED ORDER — METOPROLOL TARTRATE 12.5 MG HALF TABLET
12.5000 mg | ORAL_TABLET | Freq: Two times a day (BID) | ORAL | Status: DC
  Administered 2022-03-19 – 2022-03-21 (×5): 12.5 mg via ORAL
  Filled 2022-03-19 (×5): qty 1

## 2022-03-19 MED ORDER — SENNOSIDES-DOCUSATE SODIUM 8.6-50 MG PO TABS
1.0000 | ORAL_TABLET | Freq: Two times a day (BID) | ORAL | Status: DC | PRN
  Administered 2022-03-19: 1 via ORAL
  Filled 2022-03-19: qty 1

## 2022-03-19 MED ORDER — SODIUM CHLORIDE 0.9% FLUSH
3.0000 mL | Freq: Two times a day (BID) | INTRAVENOUS | Status: DC
  Administered 2022-03-19 – 2022-03-20 (×2): 3 mL via INTRAVENOUS

## 2022-03-19 MED ORDER — SODIUM CHLORIDE 0.9 % IV SOLN: 250.0000 mL | INTRAVENOUS | Status: DC | PRN

## 2022-03-19 MED ORDER — ~~LOC~~ CARDIAC SURGERY, PATIENT & FAMILY EDUCATION: Freq: Once | Status: AC

## 2022-03-19 NOTE — Progress Notes (Signed)
Pt admitted to rm 1 from [facility]. CHG given. Oriented pt to the unit. Initiated tele. VSS. Call bell within reach.  ? ?Lavenia Atlas, RN ? ?

## 2022-03-19 NOTE — Progress Notes (Signed)
2 Days Post-Op Procedure(s) (LRB): ?CORONARY ARTERY BYPASS GRAFTING X THREE, USING LEFT INTERNAL MAMMARY ARTERY, LEFT RADIAL ARTERY, RIGHT GREATER SAPHENOUS VEIN HARVESTED ENDOSCOPICALLY (N/A) ?TRANSESOPHAGEAL ECHOCARDIOGRAM (TEE) (N/A) ?RADIAL ARTERY HARVEST (Left) ?Subjective: ?No complaints. Ambulated around ICU this am. ? ?Objective: ?Vital signs in last 24 hours: ?Temp:  [97.9 ?F (36.6 ?C)-98.8 ?F (37.1 ?C)] 98.1 ?F (36.7 ?C) (05/06 2349) ?Pulse Rate:  [70-91] 75 (05/07 0630) ?Cardiac Rhythm: Normal sinus rhythm (05/06 2000) ?Resp:  [0-37] 0 (05/07 0630) ?BP: (87-121)/(50-76) 104/63 (05/07 0630) ?SpO2:  [67 %-100 %] 96 % (05/07 0630) ?Arterial Line BP: (100-122)/(48-57) 122/51 (05/06 1330) ?Weight:  [108.3 kg] 108.3 kg (05/07 0630) ? ?Hemodynamic parameters for last 24 hours: ?PAP: (31-32)/(24) 31/24 ?CO:  [5.6 L/min] 5.6 L/min ?CI:  [2.7 L/min/m2] 2.7 L/min/m2 ? ?Intake/Output from previous day: ?05/06 0701 - 05/07 0700 ?In: 763.7 [I.V.:563.7; IV JZPHXTAVW:979] ?Out: 4801 [KPVVZ:4827; Chest Tube:300] ?Intake/Output this shift: ?No intake/output data recorded. ? ?General appearance: alert and cooperative ?Neurologic: intact ?Heart: regular rate and rhythm, S1, S2 normal, no murmurLungs: clear to auscultation bilaterally ?Extremities: no edema ?Wound: dressings dry ? ?Lab Results: ?Recent Labs  ?  03/18/22 ?1703 03/19/22 ?0405  ?WBC 14.1* 13.5*  ?HGB 9.2* 9.2*  ?HCT 29.1* 29.4*  ?PLT 176 176  ? ?BMET:  ?Recent Labs  ?  03/18/22 ?1703 03/19/22 ?0405  ?NA 134* 138  ?K 3.9 4.4  ?CL 105 107  ?CO2 23 25  ?GLUCOSE 157* 116*  ?BUN 10 9  ?CREATININE 0.86 0.79  ?CALCIUM 8.3* 8.6*  ?  ?PT/INR:  ?Recent Labs  ?  03/17/22 ?1356  ?LABPROT 16.3*  ?INR 1.3*  ? ?ABG ?   ?Component Value Date/Time  ? PHART 7.270 (L) 03/17/2022 1936  ? HCO3 23.5 03/17/2022 1936  ? TCO2 25 03/17/2022 1936  ? ACIDBASEDEF 3.0 (H) 03/17/2022 1936  ? O2SAT 99 03/17/2022 1936  ? ?CBG (last 3)  ?Recent Labs  ?  03/18/22 ?1947 03/18/22 ?2345  03/19/22 ?0348  ?GLUCAP 177* 203* 125*  ? ?CXR: mild atelectasis in lower lobes ? ?Assessment/Plan: ?S/P Procedure(s) (LRB): ?CORONARY ARTERY BYPASS GRAFTING X THREE, USING LEFT INTERNAL MAMMARY ARTERY, LEFT RADIAL ARTERY, RIGHT GREATER SAPHENOUS VEIN HARVESTED ENDOSCOPICALLY (N/A) ?TRANSESOPHAGEAL ECHOCARDIOGRAM (TEE) (N/A) ?RADIAL ARTERY HARVEST (Left) ? ?POD 2 ?Hemodynamically stable in sinus rhythm. Continue low dose Norvasc for radial artery and start low dose Lopressor. ? ?Diuresed -2500 cc yesterday. Doubt accuracy of weights. Don't think she needs further diuresis. ? ?DM: under adequate control on Levemir and SSI.  ? ?DC sleeve and foley. Transfer to 4E. ? ?Will plan to put her on Plavix in addition to ASA after pacing wires removed. ? ? LOS: 4 days  ? ? ?Tanya Nolan ?03/19/2022 ? ? ?

## 2022-03-20 ENCOUNTER — Encounter (HOSPITAL_COMMUNITY): Payer: Self-pay | Admitting: Surgery

## 2022-03-20 ENCOUNTER — Inpatient Hospital Stay (HOSPITAL_COMMUNITY): Payer: Medicaid Other

## 2022-03-20 LAB — GLUCOSE, CAPILLARY
Glucose-Capillary: 123 mg/dL — ABNORMAL HIGH (ref 70–99)
Glucose-Capillary: 114 mg/dL — ABNORMAL HIGH (ref 70–99)
Glucose-Capillary: 161 mg/dL — ABNORMAL HIGH (ref 70–99)
Glucose-Capillary: 142 mg/dL — ABNORMAL HIGH (ref 70–99)

## 2022-03-20 MED ORDER — LIDOCAINE 5 % EX PTCH
1.0000 | MEDICATED_PATCH | Freq: Every day | CUTANEOUS | Status: DC
  Administered 2022-03-20 – 2022-03-21 (×2): 1 via TRANSDERMAL
  Filled 2022-03-20 (×2): qty 1

## 2022-03-20 MED ORDER — ASPIRIN EC 81 MG PO TBEC
81.0000 mg | DELAYED_RELEASE_TABLET | Freq: Every day | ORAL | Status: DC
  Administered 2022-03-21: 81 mg via ORAL
  Filled 2022-03-20: qty 1

## 2022-03-20 MED ORDER — FUROSEMIDE 40 MG PO TABS: 40.0000 mg | ORAL_TABLET | Freq: Every day | ORAL | Status: DC

## 2022-03-20 MED ORDER — ASPIRIN EC 325 MG PO TBEC
325.0000 mg | DELAYED_RELEASE_TABLET | Freq: Every day | ORAL | Status: AC
  Administered 2022-03-20: 325 mg via ORAL
  Filled 2022-03-20: qty 1

## 2022-03-20 MED ORDER — POTASSIUM CHLORIDE CRYS ER 20 MEQ PO TBCR: 20.0000 meq | EXTENDED_RELEASE_TABLET | Freq: Every day | ORAL | Status: DC

## 2022-03-20 MED ORDER — CLOPIDOGREL BISULFATE 75 MG PO TABS
75.0000 mg | ORAL_TABLET | Freq: Every day | ORAL | Status: DC
  Administered 2022-03-21: 75 mg via ORAL
  Filled 2022-03-20: qty 1

## 2022-03-20 MED ORDER — EMPAGLIFLOZIN 10 MG PO TABS
10.0000 mg | ORAL_TABLET | Freq: Every day | ORAL | Status: DC
  Administered 2022-03-21: 10 mg via ORAL
  Filled 2022-03-20: qty 1

## 2022-03-20 MED FILL — Thrombin (Recombinant) For Soln 20000 Unit: CUTANEOUS | Qty: 1 | Status: AC

## 2022-03-20 NOTE — Progress Notes (Signed)
D/c pacing wires per order. Pacing wires ends intact. Pt tolerated well.  ? ?Lavenia Atlas, RN ? ?

## 2022-03-20 NOTE — Progress Notes (Signed)
CARDIAC REHAB PHASE I  ? ?PRE:  Rate/Rhythm: 70 SR ? ?  BP: sitting 148/78 ? ?  SaO2: 99 RA ? ?MODE:  Ambulation: 470 ft  ? ?POST:  Rate/Rhythm: 102 ST ? ?  BP: sitting 169/77  ? ?  SaO2: 96 RA ? ?Pt stood independently and walked independently. No c/o, feels well. BP elevated, back to recliner. Practiced IS, 1200 ml currently.  ? ?Discussed with pt IS, sternal precautions, exercise, smoking cessation, diet, and CRPII. Pt receptive. Encouraged her to ask family to help her with food and cleaning. She is planning to quit smoking, resources given. Will refer to [place].  ?[id number] ? ?Yves Dill CES, ACSM ?03/20/2022 ?10:34 AM ? ? ? ? ?

## 2022-03-20 NOTE — Progress Notes (Addendum)
? ?   ?  [address] ?      [place] [address] ?            [phone]   ? ?  ? ? ?3 Days Post-Op Procedure(s) (LRB): ?CORONARY ARTERY BYPASS GRAFTING X THREE, USING LEFT INTERNAL MAMMARY ARTERY, LEFT RADIAL ARTERY, RIGHT GREATER SAPHENOUS VEIN HARVESTED ENDOSCOPICALLY (N/A) ?TRANSESOPHAGEAL ECHOCARDIOGRAM (TEE) (N/A) ?RADIAL ARTERY HARVEST (Left) ? ?Subjective: ?Patient ate this am. She had some nausea initially post op but denies this am. She has had a bowel movement. She is having a fair amount of incisional, sternal pain. ? ?Objective: ?Vital signs in last 24 hours: ?Temp:  [97.6 ?F (36.4 ?C)-98.7 ?F (37.1 ?C)] 98.6 ?F (37 ?C) (05/08 0346) ?Pulse Rate:  [71-77] 72 (05/08 0346) ?Cardiac Rhythm: Normal sinus rhythm (05/07 1923) ?Resp:  [6-22] 16 (05/08 0346) ?BP: (106-141)/(56-70) 110/58 (05/08 0346) ?SpO2:  [93 %-99 %] 93 % (05/08 0346) ?Weight:  [109.7 kg-111.3 kg] 111.3 kg (05/08 0346) ? ?Pre op weight 97.5 kg ?Current Weight  ?03/20/22 111.3 kg  ? ?  ? ?Intake/Output from previous day: ?05/07 0701 - 05/08 0700 ?In: 483 [P.O.:480; I.V.:3] ?Out: 345 [Urine:345] ? ? ?Physical Exam: ? ?Cardiovascular: RRR ?Pulmonary: Clear to auscultation bilaterally ?Abdomen: Soft, non tender, bowel sounds present. ?Extremities: Mild bilateral lower extremity edema. LUE motor/sensory intact ?Wounds: Sternal dressing removed and wound is clean and dry.  No erythema or signs of infection. ? ?Lab Results: ?CBC: ?Recent Labs  ?  03/18/22 ?1703 03/19/22 ?0405  ?WBC 14.1* 13.5*  ?HGB 9.2* 9.2*  ?HCT 29.1* 29.4*  ?PLT 176 176  ? ?BMET:  ?Recent Labs  ?  03/18/22 ?1703 03/19/22 ?0405  ?NA 134* 138  ?K 3.9 4.4  ?CL 105 107  ?CO2 23 25  ?GLUCOSE 157* 116*  ?BUN 10 9  ?CREATININE 0.86 0.79  ?CALCIUM 8.3* 8.6*  ?  ?PT/INR:  ?Lab Results  ?Component Value Date  ? INR 1.3 (H) 03/17/2022  ? ?ABG:  ?INR: ?Will add last result for INR, ABG once components are confirmed ?Will add last 4 CBG results once components are  confirmed ? ?Assessment/Plan: ? ?1. CV - S/p NSTEMI. SR this am. On Amlodipine 2.5 mg daily, Lopressor 12.5 mg bid. Will start Plavix in am as EPW will be removed today. ?2.  Pulmonary - On room air. Check CXR. Encourage incentive spirometer ?3. Volume Overload - Will discuss with Dr. Cyndia Bent;? If weight accurate ?4.  Expected post op acute blood loss anemia - H and H yesterday stable at 9.2 and 29.4 ?5. DM-CBGs 123/131/123. On Insulin. Will restart Jardiance in am.  ?6. History of hypothyroidism-continue Levothyroxine 125 mcg daily ?7. Remove EPW ?8. Regarding pain control, on Ultram and Oxy PRN. Will add Lidocaine patch ?9. Hopefully, home 1-2 days ? ?Donielle M ZimmermanPA-C ?7:09 AM ?  ? Chart reviewed, patient examined, agree with above. ?She is doing well overall. I doubt weights are accurate. She has no edema and normal kidney function. I don't think further diuresis is needed. She is going home with mother who won't be able to help her but will be around. ?

## 2022-03-20 NOTE — Plan of Care (Signed)
  Problem: Clinical Measurements: Goal: Will remain free from infection Outcome: Progressing Goal: Diagnostic test results will improve Outcome: Progressing Goal: Respiratory complications will improve Outcome: Progressing Goal: Cardiovascular complication will be avoided Outcome: Progressing   

## 2022-03-20 NOTE — Progress Notes (Signed)
Mobility Specialist: Progress Note ? ? 03/20/22 1511  ?Mobility  ?Activity Ambulated independently in hallway  ?Level of Assistance Independent  ?Assistive Device None  ?RUE Weight Bearing NWB  ?LUE Weight Bearing NWB  ?Distance Ambulated (ft) 470 ft  ?Activity Response Tolerated well  ?$Mobility charge 1 Mobility  ? ?Received pt up in the room having no complaints and agreeable to mobility. Asymptomatic throughout ambulation, returned back to room w/ call bell in reach and all needs met. ? ?Harrell Gave Kanen Mottola ?Mobility Specialist ?Mobility Specialist [place]: [phone] ?Mobility Specialist [place]: [phone] ? ?

## 2022-03-21 ENCOUNTER — Telehealth: Payer: Self-pay | Admitting: Family Medicine

## 2022-03-21 ENCOUNTER — Ambulatory Visit: Payer: Medicaid Other | Admitting: Family Medicine

## 2022-03-21 LAB — GLUCOSE, CAPILLARY: Glucose-Capillary: 110 mg/dL — ABNORMAL HIGH (ref 70–99)

## 2022-03-21 MED ORDER — AMLODIPINE BESYLATE 2.5 MG PO TABS: 2.5000 mg | ORAL_TABLET | Freq: Every day | ORAL | 0 refills | Status: DC

## 2022-03-21 MED ORDER — METOPROLOL TARTRATE 25 MG PO TABS: 12.5000 mg | ORAL_TABLET | Freq: Two times a day (BID) | ORAL | 1 refills | Status: DC

## 2022-03-21 MED ORDER — ASPIRIN 81 MG PO TBEC: 81.0000 mg | DELAYED_RELEASE_TABLET | Freq: Every day | ORAL | 11 refills | Status: DC

## 2022-03-21 MED ORDER — HYDROCORTISONE 2.5 % EX OINT: TOPICAL_OINTMENT | Freq: Two times a day (BID) | CUTANEOUS | 0 refills | Status: DC

## 2022-03-21 MED ORDER — OXYCODONE HCL 5 MG PO TABS: 5.0000 mg | ORAL_TABLET | Freq: Four times a day (QID) | ORAL | 0 refills | Status: DC | PRN

## 2022-03-21 MED ORDER — CLOPIDOGREL BISULFATE 75 MG PO TABS
75.0000 mg | ORAL_TABLET | Freq: Every day | ORAL | 1 refills | Status: DC
Start: 2022-03-21 — End: 2022-08-14

## 2022-03-21 MED ORDER — EZETIMIBE 10 MG PO TABS: 10.0000 mg | ORAL_TABLET | Freq: Every day | ORAL | 1 refills | Status: DC

## 2022-03-21 MED ORDER — ATORVASTATIN CALCIUM 80 MG PO TABS
80.0000 mg | ORAL_TABLET | Freq: Every day | ORAL | 1 refills | Status: DC
Start: 2022-03-21 — End: 2023-06-01

## 2022-03-21 MED ORDER — LIDOCAINE 5 % EX PTCH
1.0000 | MEDICATED_PATCH | Freq: Every day | CUTANEOUS | 0 refills | Status: DC
Start: 2022-03-21 — End: 2023-09-11

## 2022-03-21 MED ORDER — CETIRIZINE HCL 10 MG PO TABS: 10.0000 mg | ORAL_TABLET | Freq: Every day | ORAL | Status: DC | PRN

## 2022-03-21 NOTE — Progress Notes (Signed)
CARDIAC REHAB PHASE I  ? ?Reinforced d/c education with pt. Reviewed site care, restrictions, and exercise guidelines. Pt denies further questions or concerns at this time. Referred to CRP II [place]. ? ?[phone] ?Rufina Falco, RN BSN ?03/21/2022 ?8:34 AM ? ?

## 2022-03-21 NOTE — Progress Notes (Addendum)
? ?   ?  [address] ?      [place] [address] ?            [phone]   ? ?  ? ? ?4 Days Post-Op Procedure(s) (LRB): ?CORONARY ARTERY BYPASS GRAFTING X THREE, USING LEFT INTERNAL MAMMARY ARTERY, LEFT RADIAL ARTERY, RIGHT GREATER SAPHENOUS VEIN HARVESTED ENDOSCOPICALLY (N/A) ?TRANSESOPHAGEAL ECHOCARDIOGRAM (TEE) (N/A) ?RADIAL ARTERY HARVEST (Left) ? ?Subjective: ?Patient has no specific complaint this am. Lidocaine patch has really helped her pain. She would like to go home.  ? ?Objective: ?Vital signs in last 24 hours: ?Temp:  [98 ?F (36.7 ?C)-99 ?F (37.2 ?C)] 98 ?F (36.7 ?C) (05/09 0404) ?Pulse Rate:  [73-78] 75 (05/09 0404) ?Cardiac Rhythm: Normal sinus rhythm (05/08 2100) ?Resp:  [16-20] 20 (05/09 0404) ?BP: (120-150)/(60-74) 127/64 (05/09 0404) ?SpO2:  [94 %-100 %] 94 % (05/09 0404) ?Weight:  [111.3 kg] 111.3 kg (05/09 0404) ? ?Pre op weight 97.5 kg ?Current Weight  ?03/21/22 111.3 kg  ? ?  ? ?Intake/Output from previous day: ?05/08 0701 - 05/09 0700 ?In: 483 [P.O.:480; I.V.:3] ?Out: -  ? ? ?Physical Exam: ? ?Cardiovascular: RRR ?Pulmonary: Clear to auscultation bilaterally ?Abdomen: Soft, non tender, bowel sounds present. ?Extremities: Mild bilateral lower extremity edema. LUE motor/sensory intact ?Wounds: Sternal, LUE, and RLE wounds are clean and dry.No erythema or signs of infection. ? ?Lab Results: ?CBC: ?Recent Labs  ?  03/18/22 ?1703 03/19/22 ?0405  ?WBC 14.1* 13.5*  ?HGB 9.2* 9.2*  ?HCT 29.1* 29.4*  ?PLT 176 176  ? ? ?BMET:  ?Recent Labs  ?  03/18/22 ?1703 03/19/22 ?0405  ?NA 134* 138  ?K 3.9 4.4  ?CL 105 107  ?CO2 23 25  ?GLUCOSE 157* 116*  ?BUN 10 9  ?CREATININE 0.86 0.79  ?CALCIUM 8.3* 8.6*  ? ?  ?PT/INR:  ?Lab Results  ?Component Value Date  ? INR 1.3 (H) 03/17/2022  ? ?ABG:  ?INR: ?Will add last result for INR, ABG once components are confirmed ?Will add last 4 CBG results once components are confirmed ? ?Assessment/Plan: ? ?1. CV - S/p NSTEMI. SR this am. On Amlodipine 2.5 mg  daily, Lopressor 12.5 mg bid. Plavix to start today, ec asa decreased to 81 mg daily. ?2.  Pulmonary - On room air. Encourage incentive spirometer ?3.  Expected post op acute blood loss anemia - H and H yesterday stable at 9.2 and 29.4 ?4. DM-CBGs 161/142/110. On Insulin. Will restart Jardiance ?5. History of hypothyroidism-continue Levothyroxine 125 mcg daily ?6. Chest tube sutures to remain;will remove in office after discharge. ?7.Discharge ? ?Donielle M ZimmermanPA-C ?6:58 AM ?  ? Chart reviewed, patient examined, agree with above. ?Home today. ?

## 2022-03-21 NOTE — Telephone Encounter (Signed)
She was d/ced from the hospital today. ?I called to help her schedule and HFU appointment on two occasions with no success. ? ?HIPAA compliant callback message left. ?Please, help her schedule an appointment when she returns my call. ?

## 2022-03-21 NOTE — Progress Notes (Signed)
D/C instructions given to pt. Wound care and medications reviewed. All questions answered. IV removed, clean and intact. Family to escort pt home. ? ?Clyde Canterbury, RN ? ?

## 2022-03-21 NOTE — Telephone Encounter (Signed)
Patient returns call to nurse line.  ? ?Patient scheduled for hospital FU with PCP.  ?

## 2022-03-21 NOTE — TOC Transition Note (Signed)
Transition of Care (TOC) - CM/SW Discharge Note ?Marvetta Gibbons Therapist, sports, BSN ?Transitions of Care ?Unit 4E- RN Case Manager ?See Treatment Team for direct phone #  ? ? ?Patient Details  ?Name: Tanya Nolan ?MRN: [id number] ?Date of Birth: [birth year] ? ?Transition of Care (TOC) CM/SW Contact:  ?Dahlia Client, Romeo Rabon, RN ?Phone Number: ?03/21/2022, 9:47 AM ? ? ?Clinical Narrative:    ?Pt stable for transition home today s/p CABG. Transition of Care Department [facility]) has reviewed patient and no TOC needs have been identified at this time.  ? ?Final next level of care: Home/Self Care ?Barriers to Discharge: No Barriers Identified ? ? ?Patient Goals and CMS Choice ?  ?  ?Choice offered to / list presented to : NA ? ?Discharge Placement ?  ?           ? Home ?  ?  ?  ? ?Discharge Plan and Services ?  ?  ?Post Acute Care Choice: NA          ?DME Arranged: N/A ?DME Agency: NA ?  ?  ?  ?HH Arranged: NA ?[place] Agency: NA ?  ?  ?  ? ?Social Determinants of Health (SDOH) Interventions ?  ? ? ?Readmission Risk Interventions ? ?  03/21/2022  ?  9:47 AM  ?Readmission Risk Prevention Plan  ?Transportation Screening Complete  ?PCP or Specialist Appt within 5-7 Days Complete  ?Home Care Screening Complete  ?Medication Review (RN CM) Complete  ? ? ? ? ? ?

## 2022-03-22 MED FILL — Heparin Sodium (Porcine) Inj 1000 Unit/ML: Qty: 1000 | Status: AC

## 2022-03-22 MED FILL — Magnesium Sulfate Inj 50%: INTRAMUSCULAR | Qty: 10 | Status: AC

## 2022-03-22 MED FILL — Heparin Sodium (Porcine) Inj 1000 Unit/ML: INTRAMUSCULAR | Qty: 10 | Status: AC

## 2022-03-22 MED FILL — Sodium Bicarbonate IV Soln 8.4%: INTRAVENOUS | Qty: 50 | Status: AC

## 2022-03-22 MED FILL — Lidocaine HCl Local Soln Prefilled Syringe 100 MG/5ML (2%): INTRAMUSCULAR | Qty: 5 | Status: AC

## 2022-03-22 MED FILL — Mannitol IV Soln 20%: INTRAVENOUS | Qty: 500 | Status: AC

## 2022-03-22 MED FILL — Albumin, Human Inj 5%: INTRAVENOUS | Qty: 250 | Status: AC

## 2022-03-22 MED FILL — Sodium Chloride IV Soln 0.9%: INTRAVENOUS | Qty: 2000 | Status: AC

## 2022-03-22 MED FILL — Electrolyte-R (PH 7.4) Solution: INTRAVENOUS | Qty: 5000 | Status: AC

## 2022-03-22 MED FILL — Potassium Chloride Inj 2 mEq/ML: INTRAVENOUS | Qty: 40 | Status: AC

## 2022-03-23 ENCOUNTER — Telehealth (INDEPENDENT_AMBULATORY_CARE_PROVIDER_SITE_OTHER): Payer: Medicaid Other | Admitting: Psychiatry

## 2022-03-23 DIAGNOSIS — F32A Depression, unspecified: Secondary | ICD-10-CM

## 2022-03-23 MED ORDER — BUPROPION HCL ER (XL) 150 MG PO TB24: 150.0000 mg | ORAL_TABLET | ORAL | 0 refills | Status: DC

## 2022-03-23 MED ORDER — SERTRALINE HCL 100 MG PO TABS: 100.0000 mg | ORAL_TABLET | Freq: Every day | ORAL | 0 refills | Status: DC

## 2022-03-23 NOTE — Progress Notes (Signed)
BH MD/PA/NP OP Progress Note ? ?03/23/2022 7:27 PM ?Tanya Nolan  ?MRN:  [id number] ? ? ?Virtual Visit via Video Note ? ?I connected with Tanya Nolan on 03/23/22 at  3:30 PM EDT by a video enabled telemedicine application and verified that I am speaking with the correct person using two identifiers. ? ?Location: ?Patient: home ?Provider: offsite ?  ?I discussed the limitations of evaluation and management by telemedicine and the availability of in person appointments. The patient expressed understanding and agreed to proceed. ? ? ?  ?I discussed the assessment and treatment plan with the patient. The patient was provided an opportunity to ask questions and all were answered. The patient agreed with the plan and demonstrated an understanding of the instructions. ?  ?The patient was advised to call back or seek an in-person evaluation if the symptoms worsen or if the condition fails to improve as anticipated. ? ?I provided 5 minutes of non-face-to-face time during this encounter. ? ? ?Franne Grip, NP  ? ?Chief Complaint: Medication management ? ?HPI: Tanya Nolan is a 46 year old female presenting to [facility] behavioral health outpatient for follow-up psychiatric evaluation.  She has a psychiatric history of major depressive disorder and her symptoms are managed with Abilify 5 mg daily, Zoloft 100 mg daily and trazodone 50 mg daily at bedtime as needed for sleep.  Patient reports a recent hospitalization due to having open heart surgery.  Patient reports that her provider during hospitalization changed her psychiatric medications.  According to the patient records, patient is to discontinue Abilify due to it not being helpful and risk for future side effects.  Wellbutrin XL 150 mg daily was added for smoking cessation. Will await clearance from cardiologist prior to initiating Wellbutrin as she was not discharged with script and noted with elevated BP yesterday.  Patient reports that she has not smoked since  discharging from the hospital on 03/21/22.  Zoloft refilled at current dosage as indicated from her recent hospital discharge at [place]. ? ? ? ?Visit Diagnosis:  ?  ICD-10-CM   ?1. Mild depression  F32.A sertraline (ZOLOFT) 100 MG tablet  ?  ? ? ?Past Psychiatric History: Major depressive disorder ? ?Past Medical History:  ?Past Medical History:  ?Diagnosis Date  ? Abdominal pain 03/26/2009  ? Qualifier: Diagnosis of  By: Jerline Pain MD, Anderson Malta    ? Anxiety   ? BACK STRAIN, LUMBAR 03/26/2009  ? Qualifier: Diagnosis of  By: Jerline Pain MD, Anderson Malta    ? [place] 10/01/2007  ? Qualifier: Diagnosis of  By: Genene Churn MD, Janett Billow    ? Chronic back pain   ? tx with ibuprofen  ? Diabetes mellitus   ? Hypercholesteremia   ? Hyperlipidemia   ? no meds - tx with diet  ? Hypertension   ? Hypothyroidism   ? Ingrown toenail 12/25/2014  ? Ischemic heart disease due to coronary artery obstruction ([place]) 03/16/2022  ? 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 C&B WNL  NEG PAPS after  ? MDD (major depressive disorder), recurrent severe, without psychosis ([place]) 03/15/2020  ? Nipple discharge 10/13/2013  ? Nodule of skin of left foot 04/15/2021  ? Non compliance w medication regimen 10/13/2013  ? 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 BENIGN SECRETORY 02/2007  ? Thyroid dysfunction   ?  ?Past Surgical History:  ?Procedure Laterality Date  ? CORONARY ARTERY BYPASS GRAFT N/A 03/17/2022  ? Procedure: CORONARY ARTERY BYPASS GRAFTING X THREE, USING LEFT INTERNAL MAMMARY ARTERY, LEFT RADIAL  ARTERY, RIGHT GREATER SAPHENOUS VEIN HARVESTED ENDOSCOPICALLY;  Surgeon: [name], MD;  Location: [facility];  Service: Open Heart Surgery;  Laterality: N/A;  ? [place] DECOMPRESSION  2008  ? HYSTEROSCOPY X 2  2007 / 2012  ? ENDOMETRIAL POLYPS REMOVED  ? LEFT HEART CATH AND CORONARY ANGIOGRAPHY N/A 03/15/2022  ? Procedure: LEFT HEART CATH AND CORONARY ANGIOGRAPHY;  Surgeon: Burnell Blanks, MD;  Location: [facility] CV LAB;  Service:  Cardiovascular;  Laterality: N/A;  ? RADIAL ARTERY HARVEST Left 03/17/2022  ? Procedure: RADIAL ARTERY HARVEST;  Surgeon: [name], MD;  Location: [facility];  Service: Open Heart Surgery;  Laterality: Left;  ? TEE WITHOUT CARDIOVERSION N/A 03/17/2022  ? Procedure: TRANSESOPHAGEAL ECHOCARDIOGRAM (TEE);  Surgeon: [name], MD;  Location: [facility] [facility];  Service: Open Heart Surgery;  Laterality: N/A;  ? ? ?Family Psychiatric History: n/a ? ?Family History:  ?Family History  ?Problem Relation Age of Onset  ? Lupus Mother   ? Hypertension Mother   ? Heart disease Father   ? Gout Father   ? Lupus Father   ? Hypertension Sister   ? Breast cancer Neg Hx   ? ? ?Social History:  ?Social History  ? ?Socioeconomic History  ? Marital status: Widowed  ?  Spouse name: Not on file  ? Number of children: Not on file  ? Years of education: Not on file  ? Highest education level: Not on file  ?Occupational History  ? Not on file  ?Tobacco Use  ? Smoking status: Every Day  ?  Packs/day: 1.00  ?  Years: 8.00  ?  Pack years: 8.00  ?  Types: Cigarettes  ? Smokeless tobacco: Never  ?Vaping Use  ? Vaping Use: Never used  ?Substance and Sexual Activity  ? Alcohol use: Yes  ?  Alcohol/week: 0.0 standard drinks  ?  Comment: socially - wine/liquor--Rare  ? Drug use: Yes  ?  Frequency: 7.0 times per week  ?  Types: Marijuana  ? Sexual activity: Yes  ?  Birth control/protection: None  ?  Comment: 1st intercourse 46 yo-More than 5 partners  ?Other Topics Concern  ? Not on file  ?Social History Narrative  ? Not on file  ? ?Social Determinants of Health  ? ?Financial Resource Strain: Not on file  ?Food Insecurity: Not on file  ?Transportation Needs: Not on file  ?Physical Activity: Not on file  ?Stress: Not on file  ?Social Connections: Not on file  ? ? ?Allergies: No Known Allergies ? ?Metabolic Disorder Labs: ?Lab Results  ?Component Value Date  ? HGBA1C 6.6 01/18/2022  ? MPG 171.42 03/16/2020  ? MPG 171.42 03/16/2020  ? ?No results found for:  PROLACTIN ?Lab Results  ?Component Value Date  ? CHOL 118 03/14/2022  ? TRIG 100 03/14/2022  ? HDL 25 (L) 03/14/2022  ? CHOLHDL 4.7 03/14/2022  ? VLDL 20 03/14/2022  ? [place] 73 03/14/2022  ? LDLCALC 153 (H) 05/17/2021  ? ?Lab Results  ?Component Value Date  ? TSH 8.574 (H) 03/13/2022  ? TSH 2.530 05/17/2021  ? ? ?Therapeutic Level Labs: ?No results found for: LITHIUM ?No results found for: VALPROATE ?No components found for:  CBMZ ? ?Current Medications: ?Current Outpatient Medications  ?Medication Sig Dispense Refill  ? Accu-Chek FastClix Lancets MISC Use to check CBG TID 100 each 12  ? acetaminophen (TYLENOL) 500 MG tablet Take 1,000 mg by mouth every 6 (six) hours as needed for moderate pain or headache.    ?  albuterol (PROAIR HFA) 108 (90 Base) MCG/ACT inhaler Inhale 1-2 puffs into the lungs every 6 (six) hours as needed for wheezing or shortness of breath. 18 g 1  ? amLODipine (NORVASC) 2.5 MG tablet Take 1 tablet (2.5 mg total) by mouth daily. 30 tablet 0  ? aspirin EC 81 MG EC tablet Take 1 tablet (81 mg total) by mouth daily. Swallow whole. 30 tablet 11  ? atorvastatin (LIPITOR) 80 MG tablet Take 1 tablet (80 mg total) by mouth daily. 30 tablet 1  ? BD ULTRA-FINE PEN NEEDLES 29G X 12.7MM MISC USE THREE TIMES A DAY BEFORE MEALS 1 each 2  ? Blood Glucose Monitoring Suppl (ACCU-CHEK GUIDE) w/Device KIT 1 applicator by Does not apply route 3 (three) times daily. Use to check CBG TID 1 kit 0  ? cetirizine (ZYRTEC) 10 MG tablet Take 1 tablet (10 mg total) by mouth daily as needed for allergies.    ? clopidogrel (PLAVIX) 75 MG tablet Take 1 tablet (75 mg total) by mouth daily. 30 tablet 1  ? empagliflozin (JARDIANCE) 10 MG TABS tablet Take 1 tablet (10 mg total) by mouth daily. (Patient not taking: Reported on 01/18/2022) 90 tablet 3  ? ezetimibe (ZETIA) 10 MG tablet Take 1 tablet (10 mg total) by mouth daily. 30 tablet 1  ? glucose blood (ACCU-CHEK GUIDE) test strip USE TO CHECK GBG 3 TIMES A DAY 100 strip 1  ?  hydrocortisone 2.5 % ointment Apply topically 2 (two) times daily. Limit use to one week 30 g 0  ? levothyroxine (SYNTHROID) 125 MCG tablet Take 1 tablet (125 mcg total) by mouth daily before breakfast. Reported on 4/12/

## 2022-03-24 ENCOUNTER — Telehealth (HOSPITAL_COMMUNITY): Payer: Self-pay

## 2022-03-24 NOTE — Telephone Encounter (Signed)
Called patient to see if she is interested in the Cardiac Rehab Program. Patient expressed interest. Explained scheduling process and went over insurance, patient verbalized understanding. Will contact patient for scheduling once f/u has been completed. 

## 2022-03-24 NOTE — Telephone Encounter (Signed)
Pt insurance is active and benefits verified through Medicaid. Co-pay $0.00, DED $0.00/$0.00 met, out of pocket $0.00/$0.00 met, co-insurance 0%. No pre-authorization required. Passport, 03/24/22 @ 10:07AM, ZFT#[id number] ?  ?Will contact patient to see if she is interested in the Cardiac Rehab Program. If interested, patient will need to complete follow up appt. Once completed, patient will be contacted for scheduling upon review by the RN Navigator. ?

## 2022-03-31 ENCOUNTER — Ambulatory Visit (INDEPENDENT_AMBULATORY_CARE_PROVIDER_SITE_OTHER): Payer: Self-pay | Admitting: *Deleted

## 2022-03-31 DIAGNOSIS — Z4802 Encounter for removal of sutures: Secondary | ICD-10-CM

## 2022-03-31 NOTE — Progress Notes (Signed)
Patient arrived for nurse visit to remove sutures post-CABG 5/5 by Dr. Cyndia Bent.  Three sutures removed with no signs or symptoms of infection noted.  Incisions well approximated.  Patient tolerated suture removal well.  Patient and family instructed to keep the incision site clean and dry. Patient and family acknowledged instructions given.  All questions answered.

## 2022-04-04 ENCOUNTER — Ambulatory Visit (INDEPENDENT_AMBULATORY_CARE_PROVIDER_SITE_OTHER): Payer: Medicaid Other | Admitting: Family Medicine

## 2022-04-04 ENCOUNTER — Encounter: Payer: Self-pay | Admitting: Family Medicine

## 2022-04-04 VITALS — BP 121/70 | HR 70 | Ht 67.0 in | Wt 230.0 lb

## 2022-04-04 DIAGNOSIS — E118 Type 2 diabetes mellitus with unspecified complications: Secondary | ICD-10-CM

## 2022-04-04 DIAGNOSIS — I152 Hypertension secondary to endocrine disorders: Secondary | ICD-10-CM

## 2022-04-04 DIAGNOSIS — E039 Hypothyroidism, unspecified: Secondary | ICD-10-CM

## 2022-04-04 DIAGNOSIS — I214 Non-ST elevation (NSTEMI) myocardial infarction: Secondary | ICD-10-CM

## 2022-04-04 DIAGNOSIS — E1159 Type 2 diabetes mellitus with other circulatory complications: Secondary | ICD-10-CM

## 2022-04-04 MED ORDER — EMPAGLIFLOZIN 10 MG PO TABS: 10.0000 mg | ORAL_TABLET | Freq: Every day | ORAL | 3 refills | Status: DC

## 2022-04-04 NOTE — Patient Instructions (Signed)
It was nice seeing you today. I am glad you had a successful cardiac surgery. I have refilled some of your medications as requested. Please, continue ASA and Plavix as instructed as well as close follow-up with your cardiologist.

## 2022-04-04 NOTE — Progress Notes (Signed)
    SUBJECTIVE:   CHIEF COMPLAINT / HPI:   HFU/NSTEMI: She is here for hospital f/u from recent admission for NSTEMI S/P CABG x 3. She denies any chest pain today, but endorses some soreness from her chest incision site. She is compliant with her ASA 81 mg and Plavix 75 mg as well as Lipitor 80 mg QD and Zetia 10 mg QD.  DM2/HTN: She is compliant with her Jardiance and her Enalapril was switched to Norvasc 2.5 while in the hospital for labile BP and need to be on CCB for her CABG.  Hypothyroid: She is compliant with her Synthroid 125 mcg QD. She is here for f/u.  PERTINENT  PMH / PSH: PMHx reviewed  OBJECTIVE:   BP 121/70   Pulse 70   Ht '5\' 7"'$  (1.702 m)   Wt 230 lb (104.3 kg)   LMP 03/09/2022   SpO2 100%   BMI 36.02 kg/m   Physical Exam Vitals and nursing note reviewed.  Constitutional:      General: She is not in acute distress.    Appearance: Normal appearance.  Cardiovascular:     Rate and Rhythm: Normal rate and regular rhythm.     Pulses: Normal pulses.     Heart sounds: Normal heart sounds. No murmur heard. Pulmonary:     Effort: Pulmonary effort is normal.     Breath sounds: Normal breath sounds. No wheezing or rhonchi.  Abdominal:     General: Bowel sounds are normal. There is no distension.     Palpations: Abdomen is soft. There is no mass.     Tenderness: There is no abdominal tenderness.  Musculoskeletal:     Right lower leg: No edema.     Left lower leg: No edema.     Comments: Sensory exam of the foot is normal, tested with the monofilament. Good pulses, no lesions or ulcers, good peripheral pulses.   Neurological:     Mental Status: She is alert.     ASSESSMENT/PLAN:   Non-ST elevation (NSTEMI) myocardial infarction [facility]) She is doing well s/p CABG Continue current pharmacotherapy. F/U Cards as planned.  Hypertension associated with diabetes ([place]) Jardiance refilled. UACR checked today. Continue to hold Enalapril since her BP looks  good. Continue Norvasc for BP.  Hypothyroidism TSH was elevated during her hospitalization 3 weeks ago. TSH rechecked today. I will contact her soon with her results.    She declined COVID-19 shot  Tanya Mews, MD [place]

## 2022-04-04 NOTE — Assessment & Plan Note (Signed)
She is doing well s/p CABG Continue current pharmacotherapy. F/U Cards as planned.

## 2022-04-04 NOTE — Assessment & Plan Note (Signed)
TSH was elevated during her hospitalization 3 weeks ago. TSH rechecked today. I will contact her soon with her results.

## 2022-04-04 NOTE — Assessment & Plan Note (Signed)
Jardiance refilled. UACR checked today. Continue to hold Enalapril since her BP looks good. Continue Norvasc for BP.

## 2022-04-05 ENCOUNTER — Telehealth: Payer: Self-pay

## 2022-04-05 ENCOUNTER — Other Ambulatory Visit (HOSPITAL_COMMUNITY): Payer: Self-pay

## 2022-04-05 LAB — TSH: TSH: 1.46 u[IU]/mL (ref 0.450–4.500)

## 2022-04-05 LAB — MICROALBUMIN / CREATININE URINE RATIO
Creatinine, Urine: 220.5 mg/dL
Microalb/Creat Ratio: 4 mg/g creat (ref 0–29)
Microalbumin, Urine: 8.3 ug/mL

## 2022-04-05 NOTE — Telephone Encounter (Signed)
A Prior Authorization was initiated for this patients JARDIANCE through [place].   Confirmation number: [id number] W Medicaid ID: [id number] p

## 2022-04-06 NOTE — Progress Notes (Signed)
Office Visit    Patient Name: Tanya Nolan Date of Encounter: 04/07/2022  Primary Care Provider:  [name], MD Primary Cardiologist:  Glenetta Hew, MD  Chief Complaint    46 year old female with a history of CAD s/p NSTEMI, CABG x3, hypertension, hyperlipidemia, hypothyroidism, PCOS, tobacco use, and chronic back pain who presents for post hospital follow-up related to CAD s/p CABG x3.  Past Medical History    Past Medical History:  Diagnosis Date   Abdominal pain 03/26/2009   Qualifier: Diagnosis of  By: Jerline Pain MD, [place], LUMBAR 03/26/2009   Qualifier: Diagnosis of  By: Jerline Pain MD, Anderson [place]     [place] 10/01/2007   Qualifier: Diagnosis of  By: Genene Churn MD, Jessica     Chronic back pain    tx with ibuprofen   Diabetes mellitus    Hypercholesteremia    Hyperlipidemia    no meds - tx with diet   Hypertension    Hypothyroidism    Ingrown toenail 12/25/2014   Ischemic heart disease due to coronary artery obstruction ([place]) 03/16/2022   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   C&B WNL  NEG PAPS after   MDD (major depressive disorder), recurrent severe, without psychosis ([place]) 03/15/2020   Nipple discharge 10/13/2013   Nodule of skin of left foot 04/15/2021   Non compliance w medication regimen 10/13/2013   Simple endometrial hyperplasia 09/2006   BENIGN SECRETORY 02/2007   Thyroid dysfunction    Past Surgical History:  Procedure Laterality Date   CORONARY ARTERY BYPASS GRAFT N/A 03/17/2022   Procedure: CORONARY ARTERY BYPASS GRAFTING X THREE, USING LEFT INTERNAL MAMMARY ARTERY, LEFT RADIAL ARTERY, RIGHT GREATER SAPHENOUS VEIN HARVESTED ENDOSCOPICALLY;  Surgeon: [name], MD;  Location: [facility];  Service: Open Heart Surgery;  Laterality: N/A;   [place] DECOMPRESSION  2008   HYSTEROSCOPY X 2  2007 / 2012   ENDOMETRIAL POLYPS REMOVED   LEFT HEART CATH AND CORONARY ANGIOGRAPHY N/A 03/15/2022   Procedure: LEFT HEART CATH AND  CORONARY ANGIOGRAPHY;  Surgeon: Burnell Blanks, MD;  Location: [facility];  Service: Cardiovascular;  Laterality: N/A;   RADIAL ARTERY HARVEST Left 03/17/2022   Procedure: RADIAL ARTERY HARVEST;  Surgeon: [name], MD;  Location: [facility];  Service: Open Heart Surgery;  Laterality: Left;   TEE WITHOUT CARDIOVERSION N/A 03/17/2022   Procedure: TRANSESOPHAGEAL ECHOCARDIOGRAM (TEE);  Surgeon: [name], MD;  Location: [facility];  Service: Open Heart Surgery;  Laterality: N/A;    Allergies  No Known Allergies  History of Present Illness    46 year old female with the above past medical history including CAD s/p NSTEMI, CABG x3, hypertension, hyperlipidemia, hypothyroidism, PCOS, tobacco use, and chronic back pain.  She presented to the ED on 03/13/2022 with complaints of seizure-like activity associated with urinary incontinence and tongue biting, unresponsiveness, and chest pain during intercourse.  Patient did not recall the events. Troponin was elevated, chest x-ray was unremarkable, EKG was overall normal, MRI of head and EEG were negative.  She did report a 1 month history of chest pain on exertion prior to her ED visit which she described as a sharp pain often relieved by rest. She was hospitalized from 03/13/2022 to 03/21/2022 in the setting of NSTEMI.  Cardiology was consulted. Echocardiogram showed EF 60 to 65%, normal LV function, no RWMA, moderate concentric LVH, indeterminate diastolic parameters. Coronary CT angiogram showed calcium score of  27, 99th percentile for age and sex matched control.  CT FFR analysis showed flow-limiting lesions in the LAD and left circumflex, with total occlusion in the proximal RCA.  Follow-up cardiac catheterization on 03/15/2022 revealed pRCA 100%, p-mLCx, OM2 90%, pLAD 80%, mLAD 80% stenosed.  CT surgery was consulted and she underwent CABG x3 (LIMA-LAD, Left radial artery graft-OM, and SVG-PDA) on 03/17/2022.  Post op hospital course was overall  uncomplicated, she did have expected postop acute blood loss anemia.  She was discharged home in stable condition on 03/21/2022.  She presents today for follow-up.  Since her hospitalization she has been stable overall from a cardiac standpoint.  She does report some mild dizziness/lightheadedness when walking, she only notices this after riding in a car.  She has not checked her blood pressure when she has the symptoms.  She does note some mild incisional discomfort. Otherwise, she denies dyspnea, edema, PND, orthopnea, denies symptoms concerning for angina.  She does continue to smoke.  Other than her mild dizziness/lightheadedness, she reports feeling well overall and denies any additional concerns today.  Home Medications    Current Outpatient Medications  Medication Sig Dispense Refill   Accu-Chek FastClix Lancets MISC Use to check CBG TID 100 each 12   acetaminophen (TYLENOL) 500 MG tablet Take 1,000 mg by mouth every 6 (six) hours as needed for moderate pain or headache.     albuterol (PROAIR HFA) 108 (90 Base) MCG/ACT inhaler Inhale 1-2 puffs into the lungs every 6 (six) hours as needed for wheezing or shortness of breath. 18 g 1   amLODipine (NORVASC) 2.5 MG tablet Take 1 tablet (2.5 mg total) by mouth daily. 30 tablet 0   aspirin EC 81 MG EC tablet Take 1 tablet (81 mg total) by mouth daily. Swallow whole. 30 tablet 11   atorvastatin (LIPITOR) 80 MG tablet Take 1 tablet (80 mg total) by mouth daily. 30 tablet 1   Blood Glucose Monitoring Suppl (ACCU-CHEK GUIDE) w/Device KIT 1 applicator by Does not apply route 3 (three) times daily. Use to check CBG TID 1 kit 0   cetirizine (ZYRTEC) 10 MG tablet Take 1 tablet (10 mg total) by mouth daily as needed for allergies.     clopidogrel (PLAVIX) 75 MG tablet Take 1 tablet (75 mg total) by mouth daily. 30 tablet 1   empagliflozin (JARDIANCE) 10 MG TABS tablet Take 1 tablet (10 mg total) by mouth daily. 90 tablet 3   ezetimibe (ZETIA) 10 MG tablet  Take 1 tablet (10 mg total) by mouth daily. 30 tablet 1   glucose blood (ACCU-CHEK GUIDE) test strip USE TO CHECK GBG 3 TIMES A DAY 100 strip 1   hydrocortisone 2.5 % ointment Apply topically 2 (two) times daily. Limit use to one week 30 g 0   levothyroxine (SYNTHROID) 125 MCG tablet Take 1 tablet (125 mcg total) by mouth daily before breakfast. Reported on 02/23/2016 90 tablet 1   lidocaine (LIDODERM) 5 % Place 1 patch onto the skin daily. Remove & Discard patch within 12 hours or as directed by MD 15 patch 0   metoprolol tartrate (LOPRESSOR) 25 MG tablet Take 0.5 tablets (12.5 mg total) by mouth 2 (two) times daily. 30 tablet 1   oxyCODONE (OXY IR/ROXICODONE) 5 MG immediate release tablet Take 1 tablet (5 mg total) by mouth every 6 (six) hours as needed for severe pain. 30 tablet 0   sertraline (ZOLOFT) 100 MG tablet Take 1 tablet (100 mg total) by mouth daily.  For depression 30 tablet 0   traZODone (DESYREL) 50 MG tablet Take 1-2 tablets (50-100 mg total) by mouth at bedtime as needed for sleep. 60 tablet 3   BD ULTRA-FINE PEN NEEDLES 29G X 12.7MM MISC USE THREE TIMES A DAY BEFORE MEALS 1 each 2   No current facility-administered medications for this visit.     Review of Systems    She denies chest pain, palpitations, dyspnea, pnd, orthopnea, n, v, syncope, edema, weight gain, or early satiety. All other systems reviewed and are otherwise negative except as noted above.     Cardiac Rehabilitation Eligibility Assessment  The patient is ready to start cardiac rehabilitation pending clearance from the cardiac surgeon.    Physical Exam    VS:  BP 118/70 (BP Location: Right Arm, Patient Position: Sitting, Cuff Size: Normal)   Pulse 72   Resp 20   Ht _0  (1.702 m)   Wt 231 lb 3.2 oz (104.9 kg)   LMP 03/09/2022   BMI 36.21 kg/m  GEN: Well nourished, well developed, in no acute distress. HEENT: normal. Neck: Supple, no JVD, carotid bruits, or masses. Cardiac: RRR, no murmurs, rubs,  or gallops. No clubbing, cyanosis, edema.  Radials/DP/PT 2+ and equal bilaterally.  Respiratory:  Respirations regular and unlabored, clear to auscultation bilaterally. GI: Soft, nontender, nondistended, BS + x 4. MS: no deformity or atrophy. Skin: warm and dry, no rash.  Sternal incision well approximated, clean, dry, intact. Left forearm incision well approximated, clean, dry, intact. Neuro:  Strength and sensation are intact. Psych: Normal affect.  Accessory Clinical Findings    ECG personally reviewed by me today -NSR, 72 bpm, nonspecific ST/T wave changes- no acute changes.  Lab Results  Component Value Date   WBC 13.5 (H) 03/19/2022   HGB 9.2 (L) 03/19/2022   HCT 29.4 (L) 03/19/2022   MCV 91.9 03/19/2022   PLT 176 03/19/2022   Lab Results  Component Value Date   CREATININE 0.79 03/19/2022   BUN 9 03/19/2022   NA 138 03/19/2022   K 4.4 03/19/2022   CL 107 03/19/2022   CO2 25 03/19/2022   Lab Results  Component Value Date   ALT 19 03/13/2022   AST 25 03/13/2022   ALKPHOS 59 03/13/2022   BILITOT 0.4 03/13/2022   Lab Results  Component Value Date   CHOL 118 03/14/2022   HDL 25 (L) 03/14/2022   LDLCALC 73 03/14/2022   LDLDIRECT 65 01/05/2021   TRIG 100 03/14/2022   CHOLHDL 4.7 03/14/2022    Lab Results  Component Value Date   HGBA1C 6.6 01/18/2022    Assessment & Plan    1. CAD: S/p CABG x3 (LIMA-LAD, Left radial artery graft-OM, and SVG-PDA) on 03/17/2022. Echo showed EF 60 to 65%, normal LV function, no RWMA, moderate concentric LVH, indeterminate diastolic parameters. Stable with no anginal symptoms.  Continue aspirin, Plavix, amlodipine, metoprolol, Lipitor, and Zetia.  2. Dizziness/lightheadedness: She has noticed this since his discharge from the hospital.  However, she only notes dizziness when walking after riding in a car.  She denies presyncope, syncope, denies palpitations, dyspnea.  I encouraged her to monitor her BP with symptoms.  If BP low, consider  de-escalation of antihypertensive regimen.  We will check CBC given postop anemia.  Encouraged adequate hydration, gradual positional changes.  Continue to monitor.  3. Hypertension: BP well controlled. Mild dizziness as above.  Continue to monitor BP with symptoms.  Continue current antihypertensive regimen.   4. Hyperlipidemia: LDL  was 73 in 03/2022.  She is on Lipitor and Zetia.  Will refer to lipid clinic Pharm.D. for consideration of PCSK9 inhibitor.  5. Type 2 diabetes: A1c was 6.6 in March 2023.  Monitored and managed per PCP.  6. Hypothyroidism: TSH was 8.574 in March 2023.  She is on Synthroid. Recommend follow-up with PCP.  7. Tobacco use: She continues to smoke.  Full cessation advised.  8. Disposition: Follow-up with CT surgery as scheduled.  Follow-up in 2 to 3 months with general cardiology.  Lenna Sciara, NP 04/07/2022, 9:40 AM

## 2022-04-07 ENCOUNTER — Ambulatory Visit (INDEPENDENT_AMBULATORY_CARE_PROVIDER_SITE_OTHER): Payer: Medicaid Other | Admitting: Nurse Practitioner

## 2022-04-07 ENCOUNTER — Encounter: Payer: Self-pay | Admitting: Nurse Practitioner

## 2022-04-07 VITALS — BP 118/70 | HR 72 | Resp 20 | Ht 67.0 in | Wt 231.2 lb

## 2022-04-07 DIAGNOSIS — R42 Dizziness and giddiness: Secondary | ICD-10-CM

## 2022-04-07 DIAGNOSIS — I10 Essential (primary) hypertension: Secondary | ICD-10-CM | POA: Diagnosis not present

## 2022-04-07 DIAGNOSIS — E785 Hyperlipidemia, unspecified: Secondary | ICD-10-CM | POA: Diagnosis not present

## 2022-04-07 DIAGNOSIS — E118 Type 2 diabetes mellitus with unspecified complications: Secondary | ICD-10-CM

## 2022-04-07 DIAGNOSIS — I2510 Atherosclerotic heart disease of native coronary artery without angina pectoris: Secondary | ICD-10-CM | POA: Diagnosis not present

## 2022-04-07 DIAGNOSIS — E039 Hypothyroidism, unspecified: Secondary | ICD-10-CM

## 2022-04-07 DIAGNOSIS — Z720 Tobacco use: Secondary | ICD-10-CM

## 2022-04-07 LAB — CBC
Hematocrit: 33.5 % — ABNORMAL LOW (ref 34.0–46.6)
Hemoglobin: 11.1 g/dL (ref 11.1–15.9)
MCH: 28.8 pg (ref 26.6–33.0)
MCHC: 33.1 g/dL (ref 31.5–35.7)
MCV: 87 fL (ref 79–97)
Platelets: 430 10*3/uL (ref 150–450)
RBC: 3.85 x10E6/uL (ref 3.77–5.28)
RDW: 13.2 % (ref 11.7–15.4)
WBC: 10.1 10*3/uL (ref 3.4–10.8)

## 2022-04-07 NOTE — Patient Instructions (Addendum)
Medication Instructions:  Your physician recommends that you continue on your current medications as directed. Please refer to the Current Medication list given to you today.  *If you need a refill on your cardiac medications before your next appointment, please call your pharmacy*   Lab Work: Your physician recommends that you complete labs today: CBC  If you have labs (blood work) drawn today and your tests are completely normal, you will receive your results only by: [place] (if you have MyChart) OR A paper copy in the mail If you have any lab test that is abnormal or we need to change your treatment, we will call you to review the results.   Testing/Procedures: NONE ordered at this time of appointment     Follow-Up: At [facility], you and your health needs are our priority.  As part of our continuing mission to provide you with exceptional heart care, we have created designated Provider Care Teams.  These Care Teams include your primary Cardiologist (physician) and Advanced Practice Providers (APPs -  Physician Assistants and Nurse Practitioners) who all work together to provide you with the care you need, when you need it.  We recommend signing up for the patient portal called "MyChart".  Sign up information is provided on this After Visit Summary.  MyChart is used to connect with patients for Virtual Visits (Telemedicine).  Patients are able to view lab/test results, encounter notes, upcoming appointments, etc.  Non-urgent messages can be sent to your provider as well.   To learn more about what you can do with MyChart, go to [email or web address].    Your next appointment:   2-3 month(s)  The format for your next appointment:   In Person  Provider:   Glenetta Hew, MD  or Diona Browner, NP        Other Instructions Referral to Pharm D  Important Information About Sugar

## 2022-04-07 NOTE — Telephone Encounter (Signed)
Prior Auth for patients medication JARDIANCE approved by MEDICAID from 04/05/22 to 04/05/23.   Patients pharmacy notified.

## 2022-04-12 ENCOUNTER — Telehealth: Payer: Self-pay

## 2022-04-12 ENCOUNTER — Ambulatory Visit: Payer: Medicaid Other

## 2022-04-12 NOTE — Telephone Encounter (Signed)
Spoke with pt. Pt was notified of lab results and will continue her current medications.

## 2022-04-18 ENCOUNTER — Other Ambulatory Visit: Payer: Self-pay | Admitting: Surgery

## 2022-04-18 ENCOUNTER — Encounter: Payer: Self-pay | Admitting: *Deleted

## 2022-04-18 DIAGNOSIS — Z951 Presence of aortocoronary bypass graft: Secondary | ICD-10-CM

## 2022-04-18 NOTE — Progress Notes (Unsigned)
Tanya Nolan [address]       [place] [address]             [id number]      .  HPI: Tanya Nolan is a 46 year old female with past history of hypertension, dyslipidemia, hypothyroidism, type 2 diabetes, tobacco abuse, and obesity who was recently admitted to the hospital with acute non-ST elevation myocardial infarction and was discovered to have multivessel coronary artery disease.  LV function was well-preserved. Patient returns for routine postoperative follow-up having undergone CABG x3 by Dr. Cyndia Bent on 03/17/2022.Tanya Nolan She had an uneventful postoperative course and was discharged on postop day 4 on aspirin, Plavix, amlodipine, metoprolol, Lipitor, and Zetia.. Since hospital discharge the patient reports Tanya Nolan says she is continue to make a progressive recovery.  Pain is well controlled.  She has resumed driving.  She does report some trouble sleeping but this is gradually getting better.  She denies shortness of breath.   Current Outpatient Medications  Medication Sig Dispense Refill   Accu-Chek FastClix Lancets MISC Use to check CBG TID 100 each 12   acetaminophen (TYLENOL) 500 MG tablet Take 1,000 mg by mouth every 6 (six) hours as needed for moderate pain or headache.     albuterol (PROAIR HFA) 108 (90 Base) MCG/ACT inhaler Inhale 1-2 puffs into the lungs every 6 (six) hours as needed for wheezing or shortness of breath. 18 g 1   amLODipine (NORVASC) 2.5 MG tablet Take 1 tablet (2.5 mg total) by mouth daily. 30 tablet 0   aspirin EC 81 MG EC tablet Take 1 tablet (81 mg total) by mouth daily. Swallow whole. 30 tablet 11   atorvastatin (LIPITOR) 80 MG tablet Take 1 tablet (80 mg total) by mouth daily. 30 tablet 1   BD ULTRA-FINE PEN NEEDLES 29G X 12.7MM MISC USE THREE TIMES A DAY BEFORE MEALS 1 each 2   Blood Glucose Monitoring Suppl (ACCU-CHEK GUIDE) w/Device KIT 1 applicator by Does not apply route 3 (three) times daily. Use to check CBG TID 1 kit 0   cetirizine (ZYRTEC) 10  MG tablet Take 1 tablet (10 mg total) by mouth daily as needed for allergies.     clopidogrel (PLAVIX) 75 MG tablet Take 1 tablet (75 mg total) by mouth daily. 30 tablet 1   empagliflozin (JARDIANCE) 10 MG TABS tablet Take 1 tablet (10 mg total) by mouth daily. 90 tablet 3   ezetimibe (ZETIA) 10 MG tablet Take 1 tablet (10 mg total) by mouth daily. 30 tablet 1   glucose blood (ACCU-CHEK GUIDE) test strip USE TO CHECK GBG 3 TIMES A DAY 100 strip 1   hydrocortisone 2.5 % ointment Apply topically 2 (two) times daily. Limit use to one week 30 g 0   levothyroxine (SYNTHROID) 125 MCG tablet Take 1 tablet (125 mcg total) by mouth daily before breakfast. Reported on 02/23/2016 90 tablet 1   lidocaine (LIDODERM) 5 % Place 1 patch onto the skin daily. Remove & Discard patch within 12 hours or as directed by MD 15 patch 0   metoprolol tartrate (LOPRESSOR) 25 MG tablet Take 0.5 tablets (12.5 mg total) by mouth 2 (two) times daily. 30 tablet 1   oxyCODONE (OXY IR/ROXICODONE) 5 MG immediate release tablet Take 1 tablet (5 mg total) by mouth every 6 (six) hours as needed for severe pain. 30 tablet 0   sertraline (ZOLOFT) 100 MG tablet Take 1 tablet (100 mg total) by mouth daily. For depression 30  tablet 0   traZODone (DESYREL) 50 MG tablet Take 1-2 tablets (50-100 mg total) by mouth at bedtime as needed for sleep. 60 tablet 3   No current facility-administered medications for this visit.    Physical Exam: Vital signs blood pressure 145/80 Pulse 90 Respirations 20 Pulse ox 97% on room air  General: Tanya Nolan appears well.  She is ambulating with no difficulty. Heart: Regular rate and rhythm, no murmur. Chest: Sternotomy incision is well-healed.  Sternum stable.  Breath sounds are full, equal, and clear to auscultation.  Chest x-ray shows no pleural effusions.  Lungs are well aerated. Extremities: no peripheral edema  Diagnostic Tests: CLINICAL DATA:  s/p cabg x 3   EXAM: CHEST - 2 VIEW   COMPARISON:   Chest radiograph Mar 20, 2022.   FINDINGS: No consolidation. No visible pleural effusions or pneumothorax. Cardiomediastinal silhouette is within normal limits. CABG and median sternotomy. No evidence of acute osseous abnormality.   IMPRESSION: No evidence of acute cardiopulmonary disease.     Electronically Signed   By: Margaretha Sheffield M.D.   On: 04/19/2022 12:50  Impression / Plan: Tanya Nolan is now about 1 month post CABG x3 after presenting with acute non-ST elevation myocardial infarction and preserved LV function.  She is making  progressive and uncomplicated recovery.  She may gradually increase her activity.  She would like for [place] to make referral to cardiac rehab.  No change in medications from our standpoint.  She is asked to continue to observe sternal precautions until she is 12 weeks postop.  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  We will plan for follow-up with Dr. Cyndia Bent in 1 month   Antony Odea, PA-C [facility] [phone]

## 2022-04-19 ENCOUNTER — Other Ambulatory Visit: Payer: Self-pay | Admitting: *Deleted

## 2022-04-19 ENCOUNTER — Encounter: Payer: Self-pay | Admitting: *Deleted

## 2022-04-19 ENCOUNTER — Ambulatory Visit
Admission: RE | Admit: 2022-04-19 | Discharge: 2022-04-19 | Disposition: A | Discharge status: Home / Self Care | Payer: Medicaid Other | Source: Ambulatory Visit | Attending: Surgery | Admitting: Surgery

## 2022-04-19 ENCOUNTER — Encounter: Payer: Self-pay | Admitting: Physician Assistant

## 2022-04-19 ENCOUNTER — Ambulatory Visit (INDEPENDENT_AMBULATORY_CARE_PROVIDER_SITE_OTHER): Payer: Self-pay | Admitting: Physician Assistant

## 2022-04-19 VITALS — BP 145/80 | HR 90 | Resp 20 | Ht 67.0 in | Wt 237.0 lb

## 2022-04-19 DIAGNOSIS — Z951 Presence of aortocoronary bypass graft: Secondary | ICD-10-CM

## 2022-04-19 NOTE — Patient Instructions (Signed)
We will make referral to cardiac rehab  No change in medications  Continue to observe sternal precautions until you are 12 weeks past surgery date  Follow-up with Dr. Cyndia Bent in 1 month

## 2022-04-20 ENCOUNTER — Telehealth (INDEPENDENT_AMBULATORY_CARE_PROVIDER_SITE_OTHER): Payer: Medicaid Other | Admitting: Psychiatry

## 2022-04-20 ENCOUNTER — Encounter (HOSPITAL_COMMUNITY): Payer: Self-pay | Admitting: Psychiatry

## 2022-04-20 DIAGNOSIS — Z720 Tobacco use: Secondary | ICD-10-CM

## 2022-04-20 DIAGNOSIS — F32A Depression, unspecified: Secondary | ICD-10-CM | POA: Diagnosis not present

## 2022-04-20 MED ORDER — BUPROPION HCL ER (XL) 150 MG PO TB24: 150.0000 mg | ORAL_TABLET | ORAL | 3 refills | Status: DC

## 2022-04-20 MED ORDER — TRAZODONE HCL 50 MG PO TABS: 50.0000 mg | ORAL_TABLET | Freq: Every evening | ORAL | 3 refills | Status: DC | PRN

## 2022-04-20 MED ORDER — ARIPIPRAZOLE 5 MG PO TABS: 5.0000 mg | ORAL_TABLET | Freq: Every day | ORAL | 3 refills | Status: DC

## 2022-04-20 MED ORDER — SERTRALINE HCL 50 MG PO TABS: 50.0000 mg | ORAL_TABLET | Freq: Every day | ORAL | 3 refills | Status: DC

## 2022-04-20 NOTE — Progress Notes (Signed)
[facility] MD/PA/NP OP Progress Note Virtual Visit via Video Note  I connected with Tanya Nolan on 04/20/22 at  2:00 PM EDT by a video enabled telemedicine application and verified that I am speaking with the correct person using two identifiers.  Location: Patient: Home Provider: Clinic   I discussed the limitations of evaluation and management by telemedicine and the availability of in person appointments. The patient expressed understanding and agreed to proceed.  I provided 30 minutes of non-face-to-face time during this encounter.       04/20/2022 2:38 PM Tanya Nolan  MRN:  [id number]  Chief Complaint: "I would like to start Wellbutrin to help me stop smoking"  HPI: 46 year old female seen today for follow up psychiatric evaluation.  She has a history of major depression.  She is currently being managed on Zoloft 100 mg daily, trazodone 50-161m nightly as needed, and Abilify 5 mg daily.  She informed wProbation officerthat she feels mentally stable but would like to start Wellbutrin to help stop her to tobacco dependence.  Today she was well-groomed, pleasant, cooperative, and engaged in conversation.  She informed wProbation officerthat while hospitalized she was given Wellbutrin to help reduce her tobacco dependency.  She informed wProbation officerthat she was not given a prescription when she left the hospital and would like to start it.  She informed wProbation officerthat she feels mentally stable on her current medications and reports that she has minimal anxiety and depression.  Provider conducted a GAD-7 and patient scored a 2.  Provider also conducted PHQ-9 and patient scored a 1.  She notes that since her hospitalization (cardiac surgery) she has gained 7 pounds and is eating adequately.  She also endorses adequate sleep.  Today she denies SI/HI/VAH, mania, or paranoia.    Provider informed patient of the risk of serotonin syndrome while being on 3 antidepressants.  She informed wProbation officerthat she does not take trazodone  regularly and reports that she would like to reduce her Zoloft.  Provider reduce Zoloft to 50 mg daily instead of 100 mg daily.   Provider offered NicoDerm patches/gum however patient reports that she disliked the patches as they made her itch and reports that she disliked the taste of the gum. Wellbutrin XL 150 mg daily started. Potential side effects of medication and risks vs benefits of treatment vs non-treatment were explained and discussed. All questions were answered. She will continue her other medications as prescribed.  No other concerns at this time.        Visit Diagnosis:    ICD-10-CM   1. Tobacco abuse  Z72.0 buPROPion (WELLBUTRIN XL) 150 MG 24 hr tablet    2. Mild depression  F32.A buPROPion (WELLBUTRIN XL) 150 MG 24 hr tablet    traZODone (DESYREL) 50 MG tablet    sertraline (ZOLOFT) 50 MG tablet    ARIPiprazole (ABILIFY) 5 MG tablet      Past Psychiatric History:  depression   Past Medical History:  Past Medical History:  Diagnosis Date   Abdominal pain 03/26/2009   Qualifier: Diagnosis of  By: PJerline PainMD, J[place] LUMBAR 03/26/2009   Qualifier: Diagnosis of  By: PJerline PainMD, JAnderson Malta    Chalazion 10/01/2007   Qualifier: Diagnosis of  By: TGenene ChurnMD, Jessica     Chronic back pain    tx with ibuprofen   Diabetes mellitus    Hypercholesteremia    Hyperlipidemia    no meds -  tx with diet   Hypertension    Hypothyroidism    Ingrown toenail 12/25/2014   Ischemic heart disease due to coronary artery obstruction ([place]) 03/16/2022   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   C&B WNL  NEG PAPS after   MDD (major depressive disorder), recurrent severe, without psychosis ([place]) 03/15/2020   Nipple discharge 10/13/2013   Nodule of skin of left foot 04/15/2021   Non compliance w medication regimen 10/13/2013   Simple endometrial hyperplasia 09/2006   BENIGN SECRETORY 02/2007   Thyroid dysfunction     Past Surgical History:   Procedure Laterality Date   CORONARY ARTERY BYPASS GRAFT N/A 03/17/2022   Procedure: CORONARY ARTERY BYPASS GRAFTING X THREE, USING LEFT INTERNAL MAMMARY ARTERY, LEFT RADIAL ARTERY, RIGHT GREATER SAPHENOUS VEIN HARVESTED ENDOSCOPICALLY;  Surgeon: [name], MD;  Location: [place];  Service: Open Heart Surgery;  Laterality: N/A;   Marie DECOMPRESSION  2008   HYSTEROSCOPY X 2  2007 / 2012   ENDOMETRIAL POLYPS REMOVED   LEFT HEART CATH AND CORONARY ANGIOGRAPHY N/A 03/15/2022   Procedure: LEFT HEART CATH AND CORONARY ANGIOGRAPHY;  Surgeon: Burnell Blanks, MD;  Location: [place] [facility];  Service: Cardiovascular;  Laterality: N/A;   RADIAL ARTERY HARVEST Left 03/17/2022   Procedure: RADIAL ARTERY HARVEST;  Surgeon: [name], MD;  Location: [place];  Service: Open Heart Surgery;  Laterality: Left;   TEE WITHOUT CARDIOVERSION N/A 03/17/2022   Procedure: TRANSESOPHAGEAL ECHOCARDIOGRAM (TEE);  Surgeon: [name], MD;  Location: [place];  Service: Open Heart Surgery;  Laterality: N/A;    Family Psychiatric History: Mother depression  Family History:  Family History  Problem Relation Age of Onset   Lupus Mother    Hypertension Mother    Heart disease Father    Gout Father    Lupus Father    Hypertension Sister    Breast cancer Neg Hx     Social History:  Social History   Socioeconomic History   Marital status: Widowed    Spouse name: Not on file   Number of children: Not on file   Years of education: Not on file   Highest education level: Not on file  Occupational History   Not on file  Tobacco Use   Smoking status: Every Day    Packs/day: 1.00    Years: 8.00    Total pack years: 8.00    Types: Cigarettes   Smokeless tobacco: Never  Vaping Use   Vaping Use: Never used  Substance and Sexual Activity   Alcohol use: Yes    Alcohol/week: 0.0 standard drinks of alcohol    Comment: socially - wine/liquor--Rare   Drug use: Yes    Frequency: 7.0 times per week     Types: Marijuana   Sexual activity: Yes    Birth control/protection: None    Comment: 1st intercourse 46 yo-More than 5 partners  Other Topics Concern   Not on file  Social History Narrative   Not on file   Social Determinants of Health   Financial Resource Strain: Not on file  Food Insecurity: Not on file  Transportation Needs: Not on file  Physical Activity: Not on file  Stress: Not on file  Social Connections: Not on file    Allergies: No Known Allergies  Metabolic Disorder Labs: Lab Results  Component Value Date   HGBA1C 6.6 01/18/2022   MPG 171.42 03/16/2020   MPG 171.42 03/16/2020   No results found for: "  PROLACTIN" Lab Results  Component Value Date   CHOL 118 03/14/2022   TRIG 100 03/14/2022   HDL 25 (L) 03/14/2022   CHOLHDL 4.7 03/14/2022   VLDL 20 03/14/2022   LDLCALC 73 03/14/2022   LDLCALC 153 (H) 05/17/2021   Lab Results  Component Value Date   TSH 1.460 04/04/2022   TSH 8.574 (H) 03/13/2022    Therapeutic Level Labs: No results found for: "LITHIUM" No results found for: "VALPROATE" No results found for: "CBMZ"  Current Medications: Current Outpatient Medications  Medication Sig Dispense Refill   ARIPiprazole (ABILIFY) 5 MG tablet Take 1 tablet (5 mg total) by mouth daily. 30 tablet 3   buPROPion (WELLBUTRIN XL) 150 MG 24 hr tablet Take 1 tablet (150 mg total) by mouth every morning. 30 tablet 3   Accu-Chek FastClix Lancets MISC Use to check CBG TID 100 each 12   acetaminophen (TYLENOL) 500 MG tablet Take 1,000 mg by mouth every 6 (six) hours as needed for moderate pain or headache.     albuterol (PROAIR HFA) 108 (90 Base) MCG/ACT inhaler Inhale 1-2 puffs into the lungs every 6 (six) hours as needed for wheezing or shortness of breath. 18 g 1   amLODipine (NORVASC) 2.5 MG tablet Take 1 tablet (2.5 mg total) by mouth daily. 30 tablet 0   aspirin EC 81 MG EC tablet Take 1 tablet (81 mg total) by mouth daily. Swallow whole. 30 tablet 11    atorvastatin (LIPITOR) 80 MG tablet Take 1 tablet (80 mg total) by mouth daily. 30 tablet 1   BD ULTRA-FINE PEN NEEDLES 29G X 12.7MM MISC USE THREE TIMES A DAY BEFORE MEALS 1 each 2   Blood Glucose Monitoring Suppl (ACCU-CHEK GUIDE) w/Device KIT 1 applicator by Does not apply route 3 (three) times daily. Use to check CBG TID 1 kit 0   cetirizine (ZYRTEC) 10 MG tablet Take 1 tablet (10 mg total) by mouth daily as needed for allergies.     clopidogrel (PLAVIX) 75 MG tablet Take 1 tablet (75 mg total) by mouth daily. 30 tablet 1   empagliflozin (JARDIANCE) 10 MG TABS tablet Take 1 tablet (10 mg total) by mouth daily. 90 tablet 3   ezetimibe (ZETIA) 10 MG tablet Take 1 tablet (10 mg total) by mouth daily. 30 tablet 1   glucose blood (ACCU-CHEK GUIDE) test strip USE TO CHECK GBG 3 TIMES A DAY 100 strip 1   hydrocortisone 2.5 % ointment Apply topically 2 (two) times daily. Limit use to one week 30 g 0   levothyroxine (SYNTHROID) 125 MCG tablet Take 1 tablet (125 mcg total) by mouth daily before breakfast. Reported on 02/23/2016 90 tablet 1   lidocaine (LIDODERM) 5 % Place 1 patch onto the skin daily. Remove & Discard patch within 12 hours or as directed by MD 15 patch 0   metoprolol tartrate (LOPRESSOR) 25 MG tablet Take 0.5 tablets (12.5 mg total) by mouth 2 (two) times daily. 30 tablet 1   oxyCODONE (OXY IR/ROXICODONE) 5 MG immediate release tablet Take 1 tablet (5 mg total) by mouth every 6 (six) hours as needed for severe pain. 30 tablet 0   sertraline (ZOLOFT) 50 MG tablet Take 1 tablet (50 mg total) by mouth daily. For depression 30 tablet 3   traZODone (DESYREL) 50 MG tablet Take 1-2 tablets (50-100 mg total) by mouth at bedtime as needed for sleep. 60 tablet 3   No current facility-administered medications for this visit.     Musculoskeletal:  Strength & Muscle Tone: within normal limits and  telehealth visit Gait & Station: normal, telehealthe visit Patient leans: N/A  Psychiatric Specialty  Exam: Review of Systems  Last menstrual period 03/09/2022.There is no height or weight on file to calculate BMI.  General Appearance: Well groomed  Eye Contact:  Good  Speech:  Clear and Coherent and Normal Rate  Volume:  Normal  Mood:  Euthymic  Affect:  Appropriate, Congruent, and Tearful  Thought Process:  Coherent, Goal Directed and Linear  Orientation:  Full (Time, Place, and Person)  Thought Content: WDL and Logical   Suicidal Thoughts:  No  Homicidal Thoughts:  No  Memory:  Immediate;   Good Recent;   Good Remote;   Good  Judgement:  Good  Insight:  Good  Psychomotor Activity:  Normal  Concentration:  Concentration: Good and Attention Span: Good  Recall:  Good  Fund of Knowledge: Good  Language: Good  Akathisia:  No  Handed:  Right  AIMS (if indicated): Not done  Assets:  Communication Skills Desire for Improvement Financial Resources/Insurance Housing Social Support  ADL's:  Intact  Cognition: WNL  Sleep:  Good   Screenings: [facility] Admission (Discharged) from 03/15/2020 in [facility]  AIMS Total Score 0      AUDIT    Flowsheet Row Admission (Discharged) from 03/15/2020 in [facility] [place] [facility]  Alcohol Use Disorder Identification Test Final Score (AUDIT) 1      GAD-7    Flowsheet Row Video Visit from 04/20/2022 in [facility] Video Visit from 01/09/2022 in [facility] Video Visit from 07/14/2021 in [facility] Video Visit from 02/14/2021 in [facility] Video Visit from 11/15/2020 in [facility]  Total GAD-7 Score _0 [id number]    Flowsheet Row Video Visit from 04/20/2022 in [facility] Most recent reading at 04/20/2022  2:12 PM Office Visit from 04/04/2022 in [facility] Most recent reading  at 04/04/2022 11:20 AM ED from 01/18/2022 in [facility] Most recent reading at 01/18/2022  3:15 PM Office Visit from 01/18/2022 in [place] Most recent reading at 01/18/2022 10:54 AM Video Visit from 01/09/2022 in [facility] Most recent reading at 01/09/2022  2:37 PM  PHQ-2 Total Score 0 0 _1 PHQ-9 Total Score _2 Flowsheet Row Video Visit from 04/20/2022 in [facility] ED to Hosp-Admission (Discharged) from 03/13/2022 in [facility] CV [place] ED from 01/18/2022 in [place] No Risk No Risk Error: Q3, 4, or 5 should not be populated when Q2 is No        Assessment and Plan: Patient notes that her anxiety and depression has improved since her last visit.  She however would like to reduce Zoloft.  Zoloft reduced to 50 mg daily.  Patient also started Wellbutrin XL 150 mg to help manage tobacco dependency.  Provider offered NicoDerm patches/gum however patient reports that she disliked the patches as they made her itch and reports that she disliked the taste of the gum.  She will continue her other medications as prescribed.    1. Mild depression  Start- buPROPion [facility]  XL) 150 MG 24 hr tablet; Take 1 tablet (150 mg total) by mouth every morning.  Dispense: 30 tablet; Refill: 3 Continue- traZODone (DESYREL) 50 MG tablet; Take 1-2 tablets (50-100 mg total) by mouth at bedtime as needed for sleep.  Dispense: 60 tablet; Refill: 3 Reduced- sertraline (ZOLOFT) 50 MG tablet; Take 1 tablet (50 mg total) by mouth daily. For depression  Dispense: 30 tablet; Refill: 3 Continue- ARIPiprazole (ABILIFY) 5 MG tablet; Take 1 tablet (5 mg total) by mouth daily.  Dispense: 30 tablet; Refill: 3  2. Tobacco abuse  Start- buPROPion (WELLBUTRIN XL) 150 MG 24 hr tablet; Take 1 tablet (150 mg total) by mouth every morning.   Dispense: 30 tablet; Refill: 3  Follow-up in 3 months Follow-up with therapy  Salley Slaughter, NP 04/20/2022, 2:38 PM

## 2022-05-05 ENCOUNTER — Ambulatory Visit (INDEPENDENT_AMBULATORY_CARE_PROVIDER_SITE_OTHER): Payer: Medicaid Other | Admitting: Pharmacist Clinician (PhC)/ Clinical Pharmacy Specialist

## 2022-05-05 DIAGNOSIS — E1169 Type 2 diabetes mellitus with other specified complication: Secondary | ICD-10-CM | POA: Diagnosis not present

## 2022-05-05 DIAGNOSIS — E785 Hyperlipidemia, unspecified: Secondary | ICD-10-CM

## 2022-05-05 NOTE — Progress Notes (Signed)
05/08/2022 Tanya Nolan 07/17/1976 [id number]   HPI:  Tanya Nolan is a 46 y.o. female patient of Dr. Ellyn Hack, who presents today for a lipid clinic evaluation.  See pertinent past medical history below.  She was most recently seen by Diona Browner NP for first post-hospital follow up after CABG last month.  She notes that she is recovering well at this point, just waiting to start cardiac rehab.    Past Medical History: CAD 03/17/22 - CABG x 3  HTN Well controlled, amlodipine 2.5,   DM2 3/23 A1c 6.6 on   Hypothyroid 3/23 TSH 8.574, on levothyorixine   Current Medications: atorvastatin 80 mg, ezetimibe 10 mg  Cholesterol Goals: LDL < 55  Family history: father with HTN, HLD, now 60; mother - lupus, HTN, DM2; one sister died enlarged heart, another sister died with immune deficiency/lupus scleroderma; no children  Diet: mix of home and eating out; fast food twice weekly; plenty of fruit and vegetables; protein is beef and chicken; occasional fried foods; snacking more than eats meals - some fruit in snacks  Exercise:  waiting for cardiac rehab; tries to walk some  Labs: 5/23: TC 118, TG 100, HDL 25, LDL 73 (at time of CABG)  (Baseline LDL 2016 - 182)   Current Outpatient Medications  Medication Sig Dispense Refill   Accu-Chek FastClix Lancets MISC Use to check CBG TID 100 each 12   acetaminophen (TYLENOL) 500 MG tablet Take 1,000 mg by mouth every 6 (six) hours as needed for moderate pain or headache.     albuterol (PROAIR HFA) 108 (90 Base) MCG/ACT inhaler Inhale 1-2 puffs into the lungs every 6 (six) hours as needed for wheezing or shortness of breath. 18 g 1   amLODipine (NORVASC) 2.5 MG tablet Take 1 tablet (2.5 mg total) by mouth daily. 30 tablet 0   ARIPiprazole (ABILIFY) 5 MG tablet Take 1 tablet (5 mg total) by mouth daily. 30 tablet 3   aspirin EC 81 MG EC tablet Take 1 tablet (81 mg total) by mouth daily. Swallow whole. 30 tablet 11   atorvastatin (LIPITOR) 80 MG tablet Take  1 tablet (80 mg total) by mouth daily. 30 tablet 1   Blood Glucose Monitoring Suppl (ACCU-CHEK GUIDE) w/Device KIT 1 applicator by Does not apply route 3 (three) times daily. Use to check CBG TID 1 kit 0   buPROPion (WELLBUTRIN XL) 150 MG 24 hr tablet Take 1 tablet (150 mg total) by mouth every morning. 30 tablet 3   cetirizine (ZYRTEC) 10 MG tablet Take 1 tablet (10 mg total) by mouth daily as needed for allergies.     clopidogrel (PLAVIX) 75 MG tablet Take 1 tablet (75 mg total) by mouth daily. 30 tablet 1   empagliflozin (JARDIANCE) 10 MG TABS tablet Take 1 tablet (10 mg total) by mouth daily. 90 tablet 3   ezetimibe (ZETIA) 10 MG tablet Take 1 tablet (10 mg total) by mouth daily. 30 tablet 1   glucose blood (ACCU-CHEK GUIDE) test strip USE TO CHECK GBG 3 TIMES A DAY 100 strip 1   hydrocortisone 2.5 % ointment Apply topically 2 (two) times daily. Limit use to one week 30 g 0   levothyroxine (SYNTHROID) 125 MCG tablet Take 1 tablet (125 mcg total) by mouth daily before breakfast. Reported on 02/23/2016 90 tablet 1   metoprolol tartrate (LOPRESSOR) 25 MG tablet Take 0.5 tablets (12.5 mg total) by mouth 2 (two) times daily. 30 tablet 1   oxyCODONE (OXY IR/ROXICODONE)  5 MG immediate release tablet Take 1 tablet (5 mg total) by mouth every 6 (six) hours as needed for severe pain. 30 tablet 0   sertraline (ZOLOFT) 50 MG tablet Take 1 tablet (50 mg total) by mouth daily. For depression 30 tablet 3   traZODone (DESYREL) 50 MG tablet Take 1-2 tablets (50-100 mg total) by mouth at bedtime as needed for sleep. 60 tablet 3   BD ULTRA-FINE PEN NEEDLES 29G X 12.7MM MISC USE THREE TIMES A DAY BEFORE MEALS 1 each 2   lidocaine (LIDODERM) 5 % Place 1 patch onto the skin daily. Remove & Discard patch within 12 hours or as directed by MD (Patient not taking: Reported on 05/05/2022) 15 patch 0   No current facility-administered medications for this visit.    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   Hyperlipidemia associated with type 2 diabetes mellitus ([place]) Patient with hyperlipidemia and recent CABG x 3.  Not at LDL goal of < 55 on combination of high intensity statin and ezetimibe.  Reviewed options for further lowering LDL cholesterol, including PCSK-9 inhibitors and bempedoic acid. Discussed mechanisms of action, dosing, side effects and potential decreases in LDL cholesterol.  Also reviewed cost information and potential options for patient assistance.  Answered all patient questions.  Based on this information, patient would prefer to start PCSK9 inhibitor.  Will start PA and once approved, pt to take Repatha 140 mg q14d.  Repeat labs after 3 months.      Tommy Medal PharmD CPP [facility] [address] [place], [place] [address] [phone]

## 2022-05-08 ENCOUNTER — Encounter: Payer: Self-pay | Admitting: Pharmacist Clinician (PhC)/ Clinical Pharmacy Specialist

## 2022-05-08 MED ORDER — REPATHA SURECLICK 140 MG/ML ~~LOC~~ SOAJ: 140.0000 mg | SUBCUTANEOUS | 12 refills | Status: DC

## 2022-05-17 LAB — HM DIABETES EYE EXAM

## 2022-05-19 ENCOUNTER — Encounter: Payer: Self-pay | Admitting: Family Medicine

## 2022-05-19 NOTE — Progress Notes (Signed)
DM eye exam report reviewed on 05/19/2022 Eye exam completed by Dr. Gershon Crane on 05/17/22 Neg DM retinopathy.

## 2022-05-25 ENCOUNTER — Ambulatory Visit: Payer: Medicaid Other | Admitting: Surgery

## 2022-05-26 ENCOUNTER — Ambulatory Visit (INDEPENDENT_AMBULATORY_CARE_PROVIDER_SITE_OTHER): Payer: Self-pay | Admitting: Surgery

## 2022-05-26 ENCOUNTER — Other Ambulatory Visit: Payer: Self-pay

## 2022-05-26 ENCOUNTER — Encounter: Payer: Self-pay | Admitting: Surgery

## 2022-05-26 VITALS — BP 163/91 | HR 64 | Resp 18 | Ht 67.0 in | Wt 246.0 lb

## 2022-05-26 DIAGNOSIS — Z951 Presence of aortocoronary bypass graft: Secondary | ICD-10-CM

## 2022-05-26 NOTE — Progress Notes (Signed)
Rtw note in my chart.

## 2022-05-26 NOTE — Progress Notes (Signed)
HPI: Patient returns for routine postoperative follow-up having undergone coronary bypass graft surgery x3 using a left radial artery graft on 03/17/2022. The patient's early postoperative recovery while in the hospital was notable for an uncomplicated postoperative course. Since hospital discharge the patient reports that she has been recovering well.  She is walking daily without chest pain or shortness of breath.  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  She has been seen back by cardiology and was referred to the lipid clinic.   Current Outpatient Medications  Medication Sig Dispense Refill   Accu-Chek FastClix Lancets MISC Use to check CBG TID 100 each 12   acetaminophen (TYLENOL) 500 MG tablet Take 1,000 mg by mouth every 6 (six) hours as needed for moderate pain or headache.     albuterol (PROAIR HFA) 108 (90 Base) MCG/ACT inhaler Inhale 1-2 puffs into the lungs every 6 (six) hours as needed for wheezing or shortness of breath. 18 g 1   amLODipine (NORVASC) 2.5 MG tablet Take 1 tablet (2.5 mg total) by mouth daily. 30 tablet 0   ARIPiprazole (ABILIFY) 5 MG tablet Take 1 tablet (5 mg total) by mouth daily. 30 tablet 3   aspirin EC 81 MG EC tablet Take 1 tablet (81 mg total) by mouth daily. Swallow whole. 30 tablet 11   atorvastatin (LIPITOR) 80 MG tablet Take 1 tablet (80 mg total) by mouth daily. 30 tablet 1   Blood Glucose Monitoring Suppl (ACCU-CHEK GUIDE) w/Device KIT 1 applicator by Does not apply route 3 (three) times daily. Use to check CBG TID 1 kit 0   buPROPion (WELLBUTRIN XL) 150 MG 24 hr tablet Take 1 tablet (150 mg total) by mouth every morning. 30 tablet 3   cetirizine (ZYRTEC) 10 MG tablet Take 1 tablet (10 mg total) by mouth daily as needed for allergies.     clopidogrel (PLAVIX) 75 MG tablet Take 1 tablet (75 mg total) by mouth daily. 30 tablet 1   empagliflozin (JARDIANCE) 10 MG TABS tablet Take 1 tablet (10 mg total) by mouth daily. 90 tablet 3    Evolocumab (REPATHA SURECLICK) 867 MG/ML SOAJ Inject 140 mg into the skin every 14 (fourteen) days. 2 mL 12   ezetimibe (ZETIA) 10 MG tablet Take 1 tablet (10 mg total) by mouth daily. 30 tablet 1   glucose blood (ACCU-CHEK GUIDE) test strip USE TO CHECK GBG 3 TIMES A DAY 100 strip 1   hydrocortisone 2.5 % ointment Apply topically 2 (two) times daily. Limit use to one week 30 g 0   levothyroxine (SYNTHROID) 125 MCG tablet Take 1 tablet (125 mcg total) by mouth daily before breakfast. Reported on 02/23/2016 90 tablet 1   lidocaine (LIDODERM) 5 % Place 1 patch onto the skin daily. Remove & Discard patch within 12 hours or as directed by MD 15 patch 0   metoprolol tartrate (LOPRESSOR) 25 MG tablet Take 0.5 tablets (12.5 mg total) by mouth 2 (two) times daily. 30 tablet 1   oxyCODONE (OXY IR/ROXICODONE) 5 MG immediate release tablet Take 1 tablet (5 mg total) by mouth every 6 (six) hours as needed for severe pain. 30 tablet 0   sertraline (ZOLOFT) 50 MG tablet Take 1 tablet (50 mg total) by mouth daily. For depression 30 tablet 3   traZODone (DESYREL) 50 MG tablet Take 1-2 tablets (50-100 mg total) by mouth at bedtime as needed for sleep. 60 tablet 3   BD ULTRA-FINE PEN NEEDLES 29G  X 12.7MM MISC USE THREE TIMES A DAY BEFORE MEALS 1 each 2   No current facility-administered medications for this visit.    Physical Exam: BP (!) 163/91 (BP Location: Left Arm, Patient Position: Sitting)   Pulse 64   Resp 18   Ht '5\' 7"'  (1.702 m)   Wt 246 lb (111.6 kg)   SpO2 98% Comment: RA  BMI 38.53 kg/m  She looks well. Cardiac exam shows a regular rate and rhythm with normal heart sounds. Lungs are clear. The chest incision is healing well and the sternum is stable.  Her left arm incision is well-healed and the left hand is neurovascularly intact.  Her leg incision is healing well.  There is no peripheral edema.  Diagnostic Tests: None today.  Her last chest x-ray on 04/19/2022 was clear.  Impression:  She  is doing well 2 months out from coronary artery bypass graft surgery.  I encouraged her to continue getting regular exercise.  I asked her not to lift anything heavier than 10 pounds for 3 months postoperatively.  She has to do lifting at her job and I do not think she should return to that for 3 months postoperatively.  Plan:  She will continue to follow-up with cardiology and her PCP and will return to see me if she has any problems with her incisions.   [name], MD [facility] [phone]

## 2022-06-29 ENCOUNTER — Encounter (HOSPITAL_BASED_OUTPATIENT_CLINIC_OR_DEPARTMENT_OTHER): Payer: Self-pay | Admitting: Emergency Medicine

## 2022-06-29 ENCOUNTER — Other Ambulatory Visit: Payer: Self-pay

## 2022-06-29 ENCOUNTER — Emergency Department (HOSPITAL_BASED_OUTPATIENT_CLINIC_OR_DEPARTMENT_OTHER)
Admission: EM | Admit: 2022-06-29 | Discharge: 2022-06-29 | Disposition: A | Discharge status: Home / Self Care | Payer: Medicaid Other | Attending: Emergency Medicine | Admitting: Emergency Medicine

## 2022-06-29 ENCOUNTER — Emergency Department (HOSPITAL_BASED_OUTPATIENT_CLINIC_OR_DEPARTMENT_OTHER): Payer: Medicaid Other

## 2022-06-29 ENCOUNTER — Telehealth: Payer: Self-pay

## 2022-06-29 DIAGNOSIS — E119 Type 2 diabetes mellitus without complications: Secondary | ICD-10-CM | POA: Insufficient documentation

## 2022-06-29 DIAGNOSIS — Z7982 Long term (current) use of aspirin: Secondary | ICD-10-CM | POA: Diagnosis not present

## 2022-06-29 DIAGNOSIS — I2510 Atherosclerotic heart disease of native coronary artery without angina pectoris: Secondary | ICD-10-CM | POA: Insufficient documentation

## 2022-06-29 DIAGNOSIS — R079 Chest pain, unspecified: Secondary | ICD-10-CM | POA: Insufficient documentation

## 2022-06-29 DIAGNOSIS — Z951 Presence of aortocoronary bypass graft: Secondary | ICD-10-CM | POA: Diagnosis not present

## 2022-06-29 DIAGNOSIS — G510 Bell's palsy: Secondary | ICD-10-CM | POA: Diagnosis not present

## 2022-06-29 DIAGNOSIS — Z7901 Long term (current) use of anticoagulants: Secondary | ICD-10-CM | POA: Insufficient documentation

## 2022-06-29 DIAGNOSIS — Z794 Long term (current) use of insulin: Secondary | ICD-10-CM | POA: Insufficient documentation

## 2022-06-29 LAB — CBC WITH DIFFERENTIAL/PLATELET
Abs Immature Granulocytes: 0.02 10*3/uL (ref 0.00–0.07)
Basophils Absolute: 0 10*3/uL (ref 0.0–0.1)
Basophils Relative: 0 %
Eosinophils Absolute: 0.2 10*3/uL (ref 0.0–0.5)
Eosinophils Relative: 2 %
HCT: 37 % (ref 36.0–46.0)
Hemoglobin: 12.4 g/dL (ref 12.0–15.0)
Immature Granulocytes: 0 %
Lymphocytes Relative: 41 %
Lymphs Abs: 3.9 10*3/uL (ref 0.7–4.0)
MCH: 28.1 pg (ref 26.0–34.0)
MCHC: 33.5 g/dL (ref 30.0–36.0)
MCV: 83.9 fL (ref 80.0–100.0)
Monocytes Absolute: 0.7 10*3/uL (ref 0.1–1.0)
Monocytes Relative: 7 %
Neutro Abs: 4.7 10*3/uL (ref 1.7–7.7)
Neutrophils Relative %: 50 %
Platelets: 247 10*3/uL (ref 150–400)
RBC: 4.41 MIL/uL (ref 3.87–5.11)
RDW: 14.8 % (ref 11.5–15.5)
WBC: 9.5 10*3/uL (ref 4.0–10.5)
nRBC: 0 % (ref 0.0–0.2)

## 2022-06-29 LAB — TROPONIN I (HIGH SENSITIVITY)
Troponin I (High Sensitivity): 7 ng/L (ref <18)
Troponin I (High Sensitivity): 8 ng/L (ref <18)

## 2022-06-29 LAB — BASIC METABOLIC PANEL
Anion gap: 6 (ref 5–15)
BUN: 5 mg/dL — ABNORMAL LOW (ref 6–20)
CO2: 27 mmol/L (ref 22–32)
Calcium: 9 mg/dL (ref 8.9–10.3)
Chloride: 105 mmol/L (ref 98–111)
Creatinine, Ser: 0.81 mg/dL (ref 0.44–1.00)
GFR, Estimated: >60 mL/min (ref >60)
Glucose, Bld: 213 mg/dL — ABNORMAL HIGH (ref 70–99)
Potassium: 3.7 mmol/L (ref 3.5–5.1)
Sodium: 138 mmol/L (ref 135–145)

## 2022-06-29 LAB — D-DIMER, QUANTITATIVE: D-Dimer, Quant: 0.88 ug/mL-FEU — ABNORMAL HIGH (ref 0.00–0.50)

## 2022-06-29 LAB — HCG, SERUM, QUALITATIVE: Preg, Serum: NEGATIVE

## 2022-06-29 MED ORDER — ARTIFICIAL TEARS OPHTHALMIC OINT
TOPICAL_OINTMENT | Freq: Every evening | OPHTHALMIC | 0 refills | Status: AC | PRN
Start: 2022-06-29 — End: ?

## 2022-06-29 MED ORDER — ASPIRIN 81 MG PO CHEW
324.0000 mg | CHEWABLE_TABLET | Freq: Once | ORAL | Status: AC
Start: 2022-06-29 — End: 2022-06-29
  Administered 2022-06-29: 324 mg via ORAL
  Filled 2022-06-29: qty 4

## 2022-06-29 MED ORDER — VALACYCLOVIR HCL 1 G PO TABS: 1000.0000 mg | ORAL_TABLET | Freq: Three times a day (TID) | ORAL | 0 refills | Status: AC

## 2022-06-29 MED ORDER — IOHEXOL 350 MG/ML SOLN
75.0000 mL | Freq: Once | INTRAVENOUS | Status: AC | PRN
  Administered 2022-06-29: 75 mL via INTRAVENOUS

## 2022-06-29 NOTE — ED Notes (Signed)
Pt lying in bed resting with eyes closed; easily aroused with verbal stimulation - no acute changes noted.  Will continue to [facility]

## 2022-06-29 NOTE — Telephone Encounter (Signed)
Patient LVM on nurse line regarding one sided facial weakness.   Called patient back to discuss further.   Patient reports that she is having left sided facial weakness. Onset ~ one week. Feels like that when she smiles "the right side goes up but the left side stays the same". Reports that she does not feel that she is getting a good seal when she drinks from a cup due to this issue.   She also reports left eye drainage.   Denies arm weakness, balance issues or difficulties with speech.   Precepted with Dr. Owens Shark who recommended patient be evaluated in Urgent care today. Advised patient of this recommendation. She will go to the [facility] once she is able to get off work.   Offered to schedule follow up visit with PCP. Patient states that she will call back if she needs to schedule follow up from urgent care visit.   Talbot Grumbling, RN

## 2022-06-29 NOTE — ED Notes (Signed)
Late entry -- Pt off the floor to CT via stretcher

## 2022-06-29 NOTE — ED Triage Notes (Signed)
Pt c/o left sided chest pain that started about 20 minutes with no radiation. Pt has hx of cabg x 3 months ago. Pt also c/o her smile and being different on the left side and drooling x 1 week.

## 2022-06-29 NOTE — ED Provider Notes (Signed)
[place] EMERGENCY DEPT Provider Note   CSN: [id number] Arrival date & time: 06/29/22  1931     History  Chief Complaint  Patient presents with   Chest Pain    Tanya Nolan is a 46 y.o. female.  She is history of diabetes and coronary disease had a CABG 3 months ago.  She said for 1 week she has had some drooping of her left face, smile is crooked, left eye is watery.  On her way here for evaluation she experienced some left-sided stabbing sharp chest pain.  She rates this as 8 out of 10.  It comes and goes.  She said she has had some ongoing chest pain since her surgery although this feels different.  She denies being short of breath.  No fevers chills nausea vomiting.  No blurry vision double vision.  No other weakness or numbness.  Last menstrual period was a few weeks ago.  The history is provided by the patient.  Chest Pain Pain location:  L chest Pain quality: sharp and stabbing   Pain radiates to:  Does not radiate Pain severity:  Severe Onset quality:  Sudden Duration:  30 minutes Timing:  Intermittent Progression:  Unchanged Chronicity:  New Relieved by:  None tried Worsened by:  Nothing Ineffective treatments:  None tried Associated symptoms: no abdominal pain, no cough, no diaphoresis, no fever, no nausea, no numbness, no shortness of breath and no vomiting   Risk factors: smoking        Home Medications Prior to Admission medications   Medication Sig Start Date End Date Taking? Authorizing Provider  Accu-Chek FastClix Lancets MISC Use to check CBG TID 07/22/19   [name], MD  acetaminophen (TYLENOL) 500 MG tablet Take 1,000 mg by mouth every 6 (six) hours as needed for moderate pain or headache.    [provider]  albuterol (PROAIR HFA) 108 (90 Base) MCG/ACT inhaler Inhale 1-2 puffs into the lungs every 6 (six) hours as needed for wheezing or shortness of breath. 05/20/21   [name], MD  amLODipine (NORVASC) 2.5 MG  tablet Take 1 tablet (2.5 mg total) by mouth daily. 03/21/22   Nani Skillern, PA-C  ARIPiprazole (ABILIFY) 5 MG tablet Take 1 tablet (5 mg total) by mouth daily. 04/20/22   Salley Slaughter, NP  aspirin EC 81 MG EC tablet Take 1 tablet (81 mg total) by mouth daily. Swallow whole. 03/21/22   Nani Skillern, PA-C  atorvastatin (LIPITOR) 80 MG tablet Take 1 tablet (80 mg total) by mouth daily. 03/21/22   Lars Pinks M, PA-C  BD ULTRA-FINE PEN NEEDLES 29G X 12.7MM MISC USE THREE TIMES A DAY BEFORE MEALS 06/10/13   Andrena Mews T, MD  Blood Glucose Monitoring Suppl (ACCU-CHEK GUIDE) w/Device KIT 1 applicator by Does not apply route 3 (three) times daily. Use to check CBG TID 07/22/19   Andrena Mews T, MD  buPROPion (WELLBUTRIN XL) 150 MG 24 hr tablet Take 1 tablet (150 mg total) by mouth every morning. 04/20/22   Salley Slaughter, NP  cetirizine (ZYRTEC) 10 MG tablet Take 1 tablet (10 mg total) by mouth daily as needed for allergies. 03/21/22   Nani Skillern, PA-C  clopidogrel (PLAVIX) 75 MG tablet Take 1 tablet (75 mg total) by mouth daily. 03/21/22   Nani Skillern, PA-C  empagliflozin (JARDIANCE) 10 MG TABS tablet Take 1 tablet (10 mg total) by mouth daily. 04/04/22   [name], MD  Evolocumab (REPATHA SURECLICK) 076 MG/ML SOAJ Inject 140 mg into the skin every 14 (fourteen) days. 05/08/22   Leonie Man, MD  ezetimibe (ZETIA) 10 MG tablet Take 1 tablet (10 mg total) by mouth daily. 03/21/22   Lars Pinks M, PA-C  glucose blood (ACCU-CHEK GUIDE) test strip USE TO CHECK GBG 3 TIMES A DAY 04/13/21   Simmons-Robinson, Makiera, MD  hydrocortisone 2.5 % ointment Apply topically 2 (two) times daily. Limit use to one week 03/21/22   Nani Skillern, PA-C  levothyroxine (SYNTHROID) 125 MCG tablet Take 1 tablet (125 mcg total) by mouth daily before breakfast. Reported on 02/23/2016 01/18/22   [name], MD  lidocaine (LIDODERM) 5 % Place 1 patch onto the  skin daily. Remove & Discard patch within 12 hours or as directed by MD 03/21/22   Nani Skillern, PA-C  metoprolol tartrate (LOPRESSOR) 25 MG tablet Take 0.5 tablets (12.5 mg total) by mouth 2 (two) times daily. 03/21/22   Nani Skillern, PA-C  oxyCODONE (OXY IR/ROXICODONE) 5 MG immediate release tablet Take 1 tablet (5 mg total) by mouth every 6 (six) hours as needed for severe pain. 03/21/22   Nani Skillern, PA-C  sertraline (ZOLOFT) 50 MG tablet Take 1 tablet (50 mg total) by mouth daily. For depression 04/20/22   Salley Slaughter, NP  traZODone (DESYREL) 50 MG tablet Take 1-2 tablets (50-100 mg total) by mouth at bedtime as needed for sleep. 04/20/22   Salley Slaughter, NP  insulin lispro protamine-insulin lispro (HUMALOG 75/25) (75-25) 100 UNIT/ML SUSP Inject 40-50 Units into the skin 2 (two) times daily with a meal. Patient takes 40 units in the morning and 50 units at night.  Verified it is Humalog 75/25.  04/18/12  [provider]      Allergies    Patient has no known allergies.    Review of Systems   Review of Systems  Constitutional:  Negative for diaphoresis and fever.  HENT:  Negative for sore throat.   Respiratory:  Negative for cough and shortness of breath.   Cardiovascular:  Positive for chest pain.  Gastrointestinal:  Negative for abdominal pain, nausea and vomiting.  Genitourinary:  Negative for dysuria.  Musculoskeletal:  Negative for neck pain.  Neurological:  Positive for facial asymmetry. Negative for speech difficulty and numbness.    Physical Exam Updated Vital Signs BP (!) 182/86   Pulse 74   Temp 98.8 F (37.1 C) (Oral)   Resp 12   Ht '5\' 7"'  (1.702 m)   Wt 109.9 kg   LMP 06/13/2022   SpO2 100%   BMI 37.96 kg/m  Physical Exam Vitals and nursing note reviewed.  Constitutional:      General: She is not in acute distress.    Appearance: She is well-developed.  HENT:     Head: Normocephalic and atraumatic.  Eyes:      Extraocular Movements: Extraocular movements intact.     Conjunctiva/sclera: Conjunctivae normal.     Pupils: Pupils are equal, round, and reactive to light.  Cardiovascular:     Rate and Rhythm: Normal rate and regular rhythm.     Heart sounds: Normal heart sounds. No murmur heard. Pulmonary:     Effort: Pulmonary effort is normal. No respiratory distress.     Breath sounds: Normal breath sounds.  Abdominal:     Palpations: Abdomen is soft.     Tenderness: There is no abdominal tenderness. There is no guarding or rebound.  Musculoskeletal:  General: No swelling. Normal range of motion.     Cervical back: Neck supple.     Right lower leg: No tenderness. No edema.     Left lower leg: No tenderness. No edema.  Skin:    General: Skin is warm and dry.     Capillary Refill: Capillary refill takes less than 2 seconds.  Neurological:     Mental Status: She is alert.     Comments: She has left facial droop.  No slurred speech.  Normal strength 5 out of 5 upper and lower extremities and normal sensation throughout.  Psychiatric:        Mood and Affect: Mood normal.     ED Results / Procedures / Treatments   Labs (all labs ordered are listed, but only abnormal results are displayed) Labs Reviewed  BASIC METABOLIC PANEL - Abnormal; Notable for the following components:      Result Value   Glucose, Bld 213 (*)    BUN 5 (*)    All other components within normal limits  D-DIMER, QUANTITATIVE - Abnormal; Notable for the following components:   D-Dimer, Quant 0.88 (*)    All other components within normal limits  CBC WITH DIFFERENTIAL/PLATELET  HCG, SERUM, QUALITATIVE  TROPONIN I (HIGH SENSITIVITY)  TROPONIN I (HIGH SENSITIVITY)    EKG EKG Interpretation  Date/Time:  Thursday June 29 2022 19:42:37 EDT Ventricular Rate:  72 PR Interval:  144 QRS Duration: 86 QT Interval:  430 QTC Calculation: 471 R Axis:   71 Text Interpretation: Sinus rhythm Nonspecific T  abnormalities, lateral leads new from prior 5/23 Confirmed by Aletta Edouard [phone]) on 06/29/2022 8:00:25 PM  Radiology CT Angio Chest PE W/Cm &/Or Wo Cm  Result Date: 06/29/2022 CLINICAL DATA:  Positive D-dimer with left-sided chest pain. EXAM: CT ANGIOGRAPHY CHEST WITH CONTRAST TECHNIQUE: Multidetector CT imaging of the chest was performed using the standard protocol during bolus administration of intravenous contrast. Multiplanar CT image reconstructions and MIPs were obtained to evaluate the vascular anatomy. RADIATION DOSE REDUCTION: This exam was performed according to the departmental dose-optimization program which includes automated exposure control, adjustment of the mA and/or kV according to patient size and/or use of iterative reconstruction technique. CONTRAST:  63m OMNIPAQUE IOHEXOL 350 MG/ML SOLN COMPARISON:  Cardiac CT 03/14/2022.  CT angiogram chest 10/22/2021. FINDINGS: Cardiovascular: Satisfactory opacification of the pulmonary arteries to the segmental level. No evidence of pulmonary embolism. Heart is mildly enlarged. No pericardial effusion. Mediastinum/Nodes: There is a hypodense left thyroid nodule measuring 11 mm which is unchanged. There are no enlarged mediastinal or hilar lymph nodes. Esophagus is within normal limits. Lungs/Pleura: There is some patchy ground-glass opacities in the bilateral lower lungs. The lungs are otherwise clear. No pleural effusion or pneumothorax. Upper Abdomen: No acute abnormality. Musculoskeletal: No chest wall abnormality. No acute or significant osseous findings. Review of the MIP images confirms the above findings. IMPRESSION: 1. No evidence for pulmonary embolism. 2. Mild cardiomegaly. 3. Patchy ground-glass opacities in the lower lungs may represent mild edema or infection/inflammation. 4. 1.1 cm incidental left thyroid nodule. No follow-up imaging recommended. Electronically Signed   By: ARonney AstersM.D.   On: 06/29/2022 21:45     Procedures Procedures    Medications Ordered in ED Medications  aspirin chewable tablet 324 mg (has no administration in time range)    ED Course/ Medical Decision Making/ A&P  Medical Decision Making Amount and/or Complexity of Data Reviewed Labs: ordered. Radiology: ordered.  Risk OTC drugs. Prescription drug management.  This patient complains of left facial droop left-sided sharp chest pain; this involves an extensive number of treatment Options and is a complaint that carries with it a high risk of complications and morbidity. The differential includes Bell's palsy, stroke, tumor, ACS, musculoskeletal, reflux, pneumothorax, PE, vascular  I ordered, reviewed and interpreted labs, which included CBC normal, chemistries normal other than elevated glucose, D-dimer mildly elevated, troponins flat, pregnancy negative I ordered medication chewable aspirin and reviewed PMP when indicated. I ordered imaging studies which included CT angio chest and I independently    visualized and interpreted imaging which showed no acute findings, does have some groundglass opacities in the bases unclear significance  Previous records obtained and reviewed in epic including recent cardiothoracic and cardiology notes  Cardiac monitoring reviewed, normal sinus rhythm Social determinants considered, ongoing tobacco use Critical Interventions: None  After the interventions stated above, I reevaluated the patient and found patient to be well-appearing comfortable blood pressure is elevated. Admission and further testing considered, no indications for admission or further work-up at this time.  Reviewed results of work-up with her.  Will prescribe valacyclovir and Lacri-Lube.  Recommended close follow-up with PCP and her cardiology team.  Return instructions discussed          Final Clinical Impression(s) / ED Diagnoses Final diagnoses:  Bell's palsy  Nonspecific  chest pain    Rx / DC Orders ED Discharge Orders          Ordered    valACYclovir (VALTREX) 1000 MG tablet  3 times daily        06/29/22 2246    artificial tears (LACRILUBE) OINT ophthalmic ointment  At bedtime PRN        06/29/22 2246              Hayden Rasmussen, MD 06/30/22 [phone]

## 2022-06-29 NOTE — ED Notes (Signed)
Late entry -- Pt awake and alert lying in bed; GCS 15.  No acute distress.  Pt reports ongoing L side cp - sudden onset when "transferring my patient".  Denies associated sob, n/v, HA, weakness or other complaints.  RR even and unlabored on RA with symmetrical rise and fall of chest.  Continuous cardiac and pulse ox maintained. Abdomen soft nontender.  Skin warm dry and intact. Will monitor for acute changes and maintain plan of care.

## 2022-06-29 NOTE — Discharge Instructions (Signed)
You are seen in the emergency department for left facial droop x1 week.  You were also having some left-sided chest pain that started just prior to arrival.  You had blood work EKG and a CAT scan of your chest that did not show any obvious explanation for your pain.  There was no evidence of a heart attack or blood clot.  The facial droop is likely from Bell's palsy which is a peripheral nerve injury.  This is treated with an antivirus medication and you will need some eye ointment for your eye at bedtime.  Please follow-up with your regular doctor

## 2022-06-29 NOTE — ED Notes (Signed)
RT educated pt on smoking cessation and how important it is for her to quit. Pt states she had a CABG back in May and has been trying to quit ever since. Pt verbalized understanding.

## 2022-07-12 ENCOUNTER — Ambulatory Visit: Payer: Medicaid Other | Attending: Nurse Practitioner | Admitting: Nurse Practitioner

## 2022-07-12 ENCOUNTER — Encounter: Payer: Self-pay | Admitting: Nurse Practitioner

## 2022-07-12 VITALS — BP 140/82 | HR 81 | Ht 67.0 in | Wt 236.6 lb

## 2022-07-12 DIAGNOSIS — I2510 Atherosclerotic heart disease of native coronary artery without angina pectoris: Secondary | ICD-10-CM | POA: Diagnosis present

## 2022-07-12 DIAGNOSIS — Q670 Congenital facial asymmetry: Secondary | ICD-10-CM

## 2022-07-12 DIAGNOSIS — R42 Dizziness and giddiness: Secondary | ICD-10-CM | POA: Diagnosis not present

## 2022-07-12 DIAGNOSIS — E039 Hypothyroidism, unspecified: Secondary | ICD-10-CM | POA: Diagnosis present

## 2022-07-12 DIAGNOSIS — Z720 Tobacco use: Secondary | ICD-10-CM | POA: Diagnosis present

## 2022-07-12 DIAGNOSIS — E118 Type 2 diabetes mellitus with unspecified complications: Secondary | ICD-10-CM

## 2022-07-12 DIAGNOSIS — I10 Essential (primary) hypertension: Secondary | ICD-10-CM | POA: Diagnosis not present

## 2022-07-12 DIAGNOSIS — E785 Hyperlipidemia, unspecified: Secondary | ICD-10-CM

## 2022-07-12 MED ORDER — AMLODIPINE BESYLATE 5 MG PO TABS: 5.0000 mg | ORAL_TABLET | Freq: Every day | ORAL | 3 refills | Status: DC

## 2022-07-12 NOTE — Progress Notes (Signed)
Office Visit    Patient Name: Tanya Nolan Date of Encounter: 07/12/2022  Primary Care Provider:  [name], MD Primary Cardiologist:  Glenetta Hew, MD  Chief Complaint    46 year old female with a history of CAD s/p NSTEMI, CABG x3, hypertension, hyperlipidemia, hypothyroidism, PCOS, tobacco use, Bell's Palsy, and chronic back pain who presents for follow-up related to CAD.   Past Medical History    Past Medical History:  Diagnosis Date   Abdominal pain 03/26/2009   Qualifier: Diagnosis of  By: Jerline Pain MD, [place], LUMBAR 03/26/2009   Qualifier: Diagnosis of  By: Jerline Pain MD, [place]     [place] 10/01/2007   Qualifier: Diagnosis of  By: Genene Churn MD, [place]     Chronic back pain    tx with ibuprofen   Diabetes mellitus    Hypercholesteremia    Hyperlipidemia    no meds - tx with diet   Hypertension    Hypothyroidism    Ingrown toenail 12/25/2014   Ischemic heart disease due to coronary artery obstruction ([place]) 03/16/2022   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   C&B WNL  NEG PAPS after   MDD (major depressive disorder), recurrent severe, without psychosis ([place]) 03/15/2020   Nipple discharge 10/13/2013   Nodule of skin of left foot 04/15/2021   Non compliance w medication regimen 10/13/2013   Simple endometrial hyperplasia 09/2006   BENIGN SECRETORY 02/2007   Thyroid dysfunction    Past Surgical History:  Procedure Laterality Date   CORONARY ARTERY BYPASS GRAFT N/A 03/17/2022   Procedure: CORONARY ARTERY BYPASS GRAFTING X THREE, USING LEFT INTERNAL MAMMARY ARTERY, LEFT RADIAL ARTERY, RIGHT GREATER SAPHENOUS VEIN HARVESTED ENDOSCOPICALLY;  Surgeon: [name], MD;  Location: [place] [facility];  Service: Open Heart Surgery;  Laterality: N/A;   [place] DECOMPRESSION  2008   HYSTEROSCOPY X 2  2007 / 2012   ENDOMETRIAL POLYPS REMOVED   LEFT HEART CATH AND CORONARY ANGIOGRAPHY N/A 03/15/2022   Procedure: LEFT HEART CATH AND CORONARY  ANGIOGRAPHY;  Surgeon: Burnell Blanks, MD;  Location: [facility];  Service: Cardiovascular;  Laterality: N/A;   RADIAL ARTERY HARVEST Left 03/17/2022   Procedure: RADIAL ARTERY HARVEST;  Surgeon: [name], MD;  Location: [facility];  Service: Open Heart Surgery;  Laterality: Left;   TEE WITHOUT CARDIOVERSION N/A 03/17/2022   Procedure: TRANSESOPHAGEAL ECHOCARDIOGRAM (TEE);  Surgeon: [name], MD;  Location: [facility];  Service: Open Heart Surgery;  Laterality: N/A;    Allergies  Allergies  Allergen Reactions   Metformin Diarrhea    History of Present Illness    46 year old female with the above past medical history including CAD s/p NSTEMI, CABG x3, hypertension, hyperlipidemia, hypothyroidism, PCOS, tobacco use, Bell's Palsy, and chronic back pain.   She presented to the ED on 03/13/2022 with complaints of seizure-like activity associated with urinary incontinence and tongue biting, unresponsiveness, and chest pain during intercourse.  Patient did not recall the events. Troponin was elevated, chest x-ray was unremarkable, EKG was overall normal, MRI of head and EEG were negative.  She did report a 1 month history of chest pain on exertion prior to her ED visit which she described as a sharp pain often relieved by rest. She was hospitalized from 03/13/2022 to 03/21/2022 in the setting of NSTEMI. Cardiology was consulted. Echocardiogram showed EF 60 to 65%, normal LV function, no RWMA, moderate concentric LVH, indeterminate diastolic parameters. Coronary  CT angiogram showed calcium score of 709, 99th percentile for age and sex matched control.  CT FFR analysis showed flow-limiting lesions in the LAD and left circumflex, with total occlusion in the proximal RCA. Follow-up cardiac catheterization on 03/15/2022 revealed pRCA 100%, p-mLCx, OM2 90%, pLAD 80%, mLAD 80% stenosed.  CT surgery was consulted and she underwent CABG x3 (LIMA-LAD, Left radial artery graft-OM, and SVG-PDA) on 03/17/2022.   Post-op hospital course was overall uncomplicated.  She was last seen in the office on 04/07/2022 and was stable from a cardiac standpoint. She noted mild dizziness/lightheadedness when walking (only after riding in a car).  Ongoing monitoring of BP was advised.  She was still smoking at the time.  She was referred to lipid clinic Pharm.D. and was ultimately started on Repatha.  She presented to the ED on 06/29/2022 with complaints of left facial drooping, chest pain.  Troponin was negative, D-dimer was mildly elevated, CT angio of the chest was negative for acute findings. EKG was unremarkable. BP was elevated.  She was treated for Bell's palsy and discharged home in stable condition.  She presents today for follow-up. Since her last visit and since her recent ED visit has been stable from a cardiac standpoint.  She does continue to note some midsternal incisional pain, she denies chest pain, dyspnea, edema, PND, orthopnea, dizziness, palpitations.  She is back to work full-time.  She continues to smoke.  She has not been checking her BP at home.  BP is somewhat elevated in office today.  She states she is no longer taking amlodipine.  Additionally, she has only been taking her Repatha for 3 weeks.  Otherwise, she reports feeling well denies any additional concerns today.  Home Medications    Current Outpatient Medications  Medication Sig Dispense Refill   acetaminophen (TYLENOL) 500 MG tablet Take 1,000 mg by mouth every 6 (six) hours as needed for moderate pain or headache.     amLODipine (NORVASC) 5 MG tablet Take 1 tablet (5 mg total) by mouth daily. 90 tablet 3   ARIPiprazole (ABILIFY) 5 MG tablet Take 1 tablet (5 mg total) by mouth daily. 30 tablet 3   aspirin EC 81 MG EC tablet Take 1 tablet (81 mg total) by mouth daily. Swallow whole. 30 tablet 11   atorvastatin (LIPITOR) 80 MG tablet Take 1 tablet (80 mg total) by mouth daily. 30 tablet 1   buPROPion (WELLBUTRIN XL) 150 MG 24 hr tablet Take 1  tablet (150 mg total) by mouth every morning. 30 tablet 3   cetirizine (ZYRTEC) 10 MG tablet Take 1 tablet (10 mg total) by mouth daily as needed for allergies.     clopidogrel (PLAVIX) 75 MG tablet Take 1 tablet (75 mg total) by mouth daily. 30 tablet 1   empagliflozin (JARDIANCE) 10 MG TABS tablet Take 1 tablet (10 mg total) by mouth daily. 90 tablet 3   Evolocumab (REPATHA SURECLICK) 250 MG/ML SOAJ Inject 140 mg into the skin every 14 (fourteen) days. 2 mL 12   ezetimibe (ZETIA) 10 MG tablet Take 1 tablet (10 mg total) by mouth daily. 30 tablet 1   hydrocortisone 2.5 % ointment Apply topically 2 (two) times daily. Limit use to one week 30 g 0   levothyroxine (SYNTHROID) 125 MCG tablet Take 1 tablet (125 mcg total) by mouth daily before breakfast. Reported on 02/23/2016 90 tablet 1   metoprolol tartrate (LOPRESSOR) 25 MG tablet Take 0.5 tablets (12.5 mg total) by mouth 2 (two) times  daily. 30 tablet 1   sertraline (ZOLOFT) 50 MG tablet Take 1 tablet (50 mg total) by mouth daily. For depression 30 tablet 3   traZODone (DESYREL) 50 MG tablet Take 1-2 tablets (50-100 mg total) by mouth at bedtime as needed for sleep. 60 tablet 3   Accu-Chek FastClix Lancets MISC Use to check CBG TID (Patient not taking: Reported on 07/12/2022) 100 each 12   albuterol (PROAIR HFA) 108 (90 Base) MCG/ACT inhaler Inhale 1-2 puffs into the lungs every 6 (six) hours as needed for wheezing or shortness of breath. (Patient not taking: Reported on 07/12/2022) 18 g 1   artificial tears (LACRILUBE) OINT ophthalmic ointment Place into the left eye at bedtime as needed for dry eyes. (Patient not taking: Reported on 07/12/2022) 3.5 g 0   Blood Glucose Monitoring Suppl (ACCU-CHEK GUIDE) w/Device KIT 1 applicator by Does not apply route 3 (three) times daily. Use to check CBG TID (Patient not taking: Reported on 07/12/2022) 1 kit 0   glucose blood (ACCU-CHEK GUIDE) test strip USE TO CHECK GBG 3 TIMES A DAY (Patient not taking: Reported on  07/12/2022) 100 strip 1   lidocaine (LIDODERM) 5 % Place 1 patch onto the skin daily. Remove & Discard patch within 12 hours or as directed by MD (Patient not taking: Reported on 07/12/2022) 15 patch 0   oxyCODONE (OXY IR/ROXICODONE) 5 MG immediate release tablet Take 1 tablet (5 mg total) by mouth every 6 (six) hours as needed for severe pain. (Patient not taking: Reported on 07/12/2022) 30 tablet 0   No current facility-administered medications for this visit.     Review of Systems    She denies chest pain, palpitations, dyspnea, pnd, orthopnea, n, v, dizziness, syncope, edema, weight gain, or early satiety. All other systems reviewed and are otherwise negative except as noted above.     Cardiac Rehabilitation Eligibility Assessment  The patient is ready to start cardiac rehabilitation from a cardiac standpoint.    Physical Exam    VS:  BP (!) 140/82   Pulse 81   Ht _0  (1.702 m)   Wt 236 lb 9.6 oz (107.3 kg)   LMP 06/13/2022   SpO2 97%   BMI 37.06 kg/m  GEN: Well nourished, well developed, in no acute distress. HEENT: normal. Neck: Supple, no JVD, carotid bruits, or masses. Cardiac: RRR, no murmurs, rubs, or gallops. No clubbing, cyanosis, edema.  Radials/DP/PT 2+ and equal bilaterally.  Respiratory:  Respirations regular and unlabored, clear to auscultation bilaterally. GI: Soft, nontender, nondistended, BS + x 4. MS: no deformity or atrophy. Skin: warm and dry, no rash. Neuro:  Strength and sensation are intact. Psych: Normal affect.  Accessory Clinical Findings    ECG personally reviewed by me today - No EKG in office today.    Lab Results  Component Value Date   WBC 9.5 06/29/2022   HGB 12.4 06/29/2022   HCT 37.0 06/29/2022   MCV 83.9 06/29/2022   PLT 247 06/29/2022   Lab Results  Component Value Date   CREATININE 0.81 06/29/2022   BUN 5 (L) 06/29/2022   NA 138 06/29/2022   K 3.7 06/29/2022   CL 105 06/29/2022   CO2 27 06/29/2022   Lab Results   Component Value Date   ALT 19 03/13/2022   AST 25 03/13/2022   ALKPHOS 59 03/13/2022   BILITOT 0.4 03/13/2022   Lab Results  Component Value Date   CHOL 118 03/14/2022   HDL 25 (L) 03/14/2022  LDLCALC 73 03/14/2022   LDLDIRECT 65 01/05/2021   TRIG 100 03/14/2022   CHOLHDL 4.7 03/14/2022    Lab Results  Component Value Date   HGBA1C 6.6 01/18/2022    Assessment & Plan    1. CAD: S/p CABG x3 (LIMA-LAD, Left radial artery graft-OM, and SVG-PDA) on 03/17/2022. Echo showed EF 60 to 65%, normal LV function, no RWMA, moderate concentric LVH, indeterminate diastolic parameters. Stable with no anginal symptoms. Continue aspirin, Plavix, amlodipine, metoprolol, Lipitor, Zetia, and Repatha.    2. Dizziness/lightheadedness: Resolved.  Encouraged adequate hydration, gradual positional changes.   3. Hypertension: BP at goal in office today.  She has not been checking her BP at home.  She was previously on amlodipine.  We will restart amlodipine at 5 mg daily.  Continue to monitor BP and report BP consistently > 130/80.  Otherwise, continue current antihypertensive regimen.   4. Hyperlipidemia: LDL was 73 in 03/2022.  He has only been on Repatha for 3 weeks.  We will plan to discuss repeat lipids, LFTs at next follow-up visit.  Continue Repatha, Lipitor, Zetia.  5. Type 2 diabetes: A1c was 6.6 in March 2023.  Monitored and managed per PCP.   6. Hypothyroidism: TSH was 1.46 in May 2023. Continue synthroid.   7. Tobacco use: She continues to smoke, but has cut down.  Full cessation advised.  8. Facial asymmetry: Resolved, thought to be related to Bell's palsy. She has follow-up scheduled with her PCP this Friday.   9. Disposition: Follow-up in 1 month.    HYPERTENSION CONTROL Vitals:   07/12/22 1350 07/12/22 1408  BP: (!) 150/89 (!) 140/82    The patient's blood pressure is elevated above target today.  In order to address the patient's elevated BP: A new medication was prescribed  today.     Lenna Sciara, NP 07/12/2022, 2:15 PM

## 2022-07-12 NOTE — Patient Instructions (Signed)
Medication Instructions:  START Amlodipine 5 mg daily  *If you need a refill on your cardiac medications before your next appointment, please call your pharmacy*   Lab Work: NONE ordered at this time of appointment   If you have labs (blood work) drawn today and your tests are completely normal, you will receive your results only by: [place] (if you have MyChart) OR A paper copy in the mail If you have any lab test that is abnormal or we need to change your treatment, we will call you to review the results.   Testing/Procedures: NONE ordered at this time of appointment     Follow-Up: At [facility], you and your health needs are our priority.  As part of our continuing mission to provide you with exceptional heart care, we have created designated Provider Care Teams.  These Care Teams include your primary Cardiologist (physician) and Advanced Practice Providers (APPs -  Physician Assistants and Nurse Practitioners) who all work together to provide you with the care you need, when you need it.  We recommend signing up for the patient portal called "MyChart".  Sign up information is provided on this After Visit Summary.  MyChart is used to connect with patients for Virtual Visits (Telemedicine).  Patients are able to view lab/test results, encounter notes, upcoming appointments, etc.  Non-urgent messages can be sent to your provider as well.   To learn more about what you can do with MyChart, go to [email or web address].    Your next appointment:   1 month(s)  The format for your next appointment:   In Person  Provider:   Diona Browner, NP        Other Instructions Monitor blood pressure. Goal is 130/80 or less.   Important Information About Sugar

## 2022-07-14 ENCOUNTER — Encounter: Payer: Medicaid Other | Admitting: Family Medicine

## 2022-07-18 ENCOUNTER — Telehealth: Payer: Self-pay | Admitting: Pharmacist Clinician (PhC)/ Clinical Pharmacy Specialist

## 2022-07-18 DIAGNOSIS — E785 Hyperlipidemia, unspecified: Secondary | ICD-10-CM

## 2022-07-18 NOTE — Telephone Encounter (Signed)
-----   Message from Rockne Menghini, [place] sent at 05/08/2022  9:30 AM EDT ----- Regarding: lipid/liver labs 3 month lipid/liver on atorva, ezet and repatha

## 2022-07-21 ENCOUNTER — Other Ambulatory Visit: Payer: Self-pay | Admitting: Physician Assistant

## 2022-07-24 ENCOUNTER — Encounter (HOSPITAL_COMMUNITY): Payer: Self-pay | Admitting: Psychiatry

## 2022-07-24 ENCOUNTER — Telehealth (INDEPENDENT_AMBULATORY_CARE_PROVIDER_SITE_OTHER): Payer: Medicaid Other | Admitting: Psychiatry

## 2022-07-24 DIAGNOSIS — F32A Depression, unspecified: Secondary | ICD-10-CM | POA: Diagnosis not present

## 2022-07-24 MED ORDER — SERTRALINE HCL 50 MG PO TABS: 50.0000 mg | ORAL_TABLET | Freq: Every day | ORAL | 3 refills | Status: DC

## 2022-07-24 MED ORDER — TRAZODONE HCL 50 MG PO TABS: 50.0000 mg | ORAL_TABLET | Freq: Every evening | ORAL | 3 refills | Status: DC | PRN

## 2022-07-24 MED ORDER — ARIPIPRAZOLE 5 MG PO TABS: 5.0000 mg | ORAL_TABLET | Freq: Every day | ORAL | 3 refills | Status: DC

## 2022-07-24 NOTE — Progress Notes (Signed)
[place] MD/PA/NP OP Progress Note Virtual Visit via Telephone Note  I connected with Tanya Nolan on 07/24/22 at  3:00 PM EDT by telephone and verified that I am speaking with the correct person using two identifiers.  Location: Patient: home Provider: Clinic   I discussed the limitations, risks, security and privacy concerns of performing an evaluation and management service by telephone and the availability of in person appointments. I also discussed with the patient that there may be a patient responsible charge related to this service. The patient expressed understanding and agreed to proceed.   I provided 30 minutes of non-face-to-face time during this encounter.        07/24/2022 11:47 AM Tanya Nolan  MRN:  [id number]  Chief Complaint: "The Wellbutrin is not working"  HPI: 46 year old female seen today for follow up psychiatric evaluation.  She has a history of major depression and tobacco dependence.  She is currently being managed on Wellbutrin XL 150 mg daily, Zoloft 100 mg daily, trazodone 50-150m nightly as needed, and Abilify 5 mg daily.  She informed wProbation officerthat Wellbutrin has not been effective in managing her tobacco dependence.  Today she was unable to login virtually sources was done over the phone.  During exam she was pleasant, cooperative, and engaged in conversation.  She informed wProbation officerthat she continues to smoke a half a pack of cigarettes a day and notes that Wellbutrin has been ineffective.  She informed wProbation officerthat at times she forgets to take it.  Patient notes that she dislikes the NicoDerm patch as it causes itching and disliked the taste of the Nicorette gum.  She informed wProbation officerthat she would like to discontinue Wellbutrin.    Patient informed wProbation officerthat she has anxiety related to finances, health, and her family however notes that she is able to cope with it.  Provider conducted to a GAD-7 and patient scored an 8, at her last visit she scored a 2.   Provider also conducted PHQ-9 obese 5, at her last visit she scored a 1.  She endorses poor appetite and adequate sleep.  Today she denies SI/HI/VH, mania, paranoia.  At this time Wellbutrin discontinued.  Patient will continue her other medications as prescribed.  No other concerns noted at this time.       Visit Diagnosis:    ICD-10-CM   1. Mild depression  F32.A ARIPiprazole (ABILIFY) 5 MG tablet    sertraline (ZOLOFT) 50 MG tablet    traZODone (DESYREL) 50 MG tablet      Past Psychiatric History:  depression and tobacco dependence  Past Medical History:  Past Medical History:  Diagnosis Date   Abdominal pain 03/26/2009   Qualifier: Diagnosis of  By: PJerline PainMD, [place] 03/26/2009   Qualifier: Diagnosis of  By: PJerline PainMD, JAnderson [place]    [place] 10/01/2007   Qualifier: Diagnosis of  By: TGenene ChurnMD, Jessica     Chronic back pain    tx with ibuprofen   Diabetes mellitus    Hypercholesteremia    Hyperlipidemia    no meds - tx with diet   Hypertension    Hypothyroidism    Ingrown toenail 12/25/2014   [place]ic [place] to [place] 03/[place]   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   C&B WNL  NEG PAPS after   MDD (major depressive disorder), recurrent severe, without psychosis [place] 03/15/2020   Nipple discharge  10/13/2013   Nodule of skin of left foot 04/15/2021   Non compliance w medication regimen 10/13/2013   Simple endometrial hyperplasia 09/2006   BENIGN SECRETORY 02/2007   Thyroid dysfunction     Past Surgical History:  Procedure Laterality Date   CORONARY ARTERY BYPASS GRAFT N/A 03/17/2022   Procedure: CORONARY ARTERY BYPASS GRAFTING X THREE, USING LEFT INTERNAL MAMMARY ARTERY, LEFT RADIAL ARTERY, RIGHT GREATER SAPHENOUS VEIN HARVESTED ENDOSCOPICALLY;  Surgeon: [name], MD;  Location: [place];  Service: Open Heart Surgery;  Laterality: N/A;   [place] DECOMPRESSION  2008    HYSTEROSCOPY X 2  2007 / 2012   ENDOMETRIAL POLYPS REMOVED   LEFT HEART CATH AND CORONARY ANGIOGRAPHY N/A 03/15/2022   Procedure: LEFT HEART CATH AND CORONARY ANGIOGRAPHY;  Surgeon: Burnell Blanks, MD;  Location: [place] [facility];  Service: Cardiovascular;  Laterality: N/A;   RADIAL ARTERY HARVEST Left 03/17/2022   Procedure: RADIAL ARTERY HARVEST;  Surgeon: [name], MD;  Location: [place];  Service: Open Heart Surgery;  Laterality: Left;   TEE WITHOUT CARDIOVERSION N/A 03/17/2022   Procedure: TRANSESOPHAGEAL ECHOCARDIOGRAM (TEE);  Surgeon: [name], MD;  Location: [place];  Service: Open Heart Surgery;  Laterality: N/A;    Family Psychiatric History: Mother depression  Family History:  Family History  Problem Relation Age of Onset   Lupus Mother    Hypertension Mother    Heart disease Father    Gout Father    Lupus Father    Hypertension Sister    Breast cancer Neg Hx     Social History:  Social History   Socioeconomic History   Marital status: Widowed    Spouse name: Not on file   Number of children: Not on file   Years of education: Not on file   Highest education level: Not on file  Occupational History   Not on file  Tobacco Use   Smoking status: Every Day    Packs/day: 1.00    Years: 8.00    Total pack years: 8.00    Types: Cigarettes   Smokeless tobacco: Never  Vaping Use   Vaping Use: Never used  Substance and Sexual Activity   Alcohol use: Yes    Alcohol/week: 0.0 standard drinks of alcohol    Comment: socially - wine/liquor--Rare   Drug use: Yes    Frequency: 7.0 times per week    Types: Marijuana   Sexual activity: Yes    Birth control/protection: None    Comment: 1st intercourse 46 yo-More than 5 partners  Other Topics Concern   Not on file  Social History Narrative   Not on file   Social Determinants of Health   Financial Resource Strain: Not on file  Food Insecurity: Not on file  Transportation Needs: Not on file  Physical  Activity: Not on file  Stress: Not on file  Social Connections: Not on file    Allergies:  Allergies  Allergen Reactions   Metformin Diarrhea    Metabolic Disorder Labs: Lab Results  Component Value Date   HGBA1C 6.6 01/18/2022   MPG 171.42 03/16/2020   MPG 171.42 03/16/2020   No results found for: "PROLACTIN" Lab Results  Component Value Date   CHOL 118 03/14/2022   TRIG 100 03/14/2022   HDL 25 (L) 03/14/2022   CHOLHDL 4.7 03/14/2022   VLDL 20 03/14/2022   LDLCALC 73 03/14/2022   LDLCALC 153 (H) 05/17/2021   Lab Results  Component Value Date  TSH 1.460 04/04/2022   TSH 8.574 (H) 03/13/2022    Therapeutic Level Labs: No results found for: "LITHIUM" No results found for: "VALPROATE" No results found for: "CBMZ"  Current Medications: Current Outpatient Medications  Medication Sig Dispense Refill   Accu-Chek FastClix Lancets MISC Use to check CBG TID (Patient not taking: Reported on 07/12/2022) 100 each 12   acetaminophen (TYLENOL) 500 MG tablet Take 1,000 mg by mouth every 6 (six) hours as needed for moderate pain or headache.     albuterol (PROAIR HFA) 108 (90 Base) MCG/ACT inhaler Inhale 1-2 puffs into the lungs every 6 (six) hours as needed for wheezing or shortness of breath. (Patient not taking: Reported on 07/12/2022) 18 g 1   amLODipine (NORVASC) 5 MG tablet Take 1 tablet (5 mg total) by mouth daily. 90 tablet 3   ARIPiprazole (ABILIFY) 5 MG tablet Take 1 tablet (5 mg total) by mouth daily. 30 tablet 3   artificial tears (LACRILUBE) OINT ophthalmic ointment Place into the left eye at bedtime as needed for dry eyes. (Patient not taking: Reported on 07/12/2022) 3.5 g 0   aspirin EC 81 MG EC tablet Take 1 tablet (81 mg total) by mouth daily. Swallow whole. 30 tablet 11   atorvastatin (LIPITOR) 80 MG tablet Take 1 tablet (80 mg total) by mouth daily. 30 tablet 1   Blood Glucose Monitoring Suppl (ACCU-CHEK GUIDE) w/Device KIT 1 applicator by Does not apply route 3  (three) times daily. Use to check CBG TID (Patient not taking: Reported on 07/12/2022) 1 kit 0   cetirizine (ZYRTEC) 10 MG tablet Take 1 tablet (10 mg total) by mouth daily as needed for allergies.     clopidogrel (PLAVIX) 75 MG tablet Take 1 tablet (75 mg total) by mouth daily. 30 tablet 1   empagliflozin (JARDIANCE) 10 MG TABS tablet Take 1 tablet (10 mg total) by mouth daily. 90 tablet 3   Evolocumab (REPATHA SURECLICK) 353 MG/ML SOAJ Inject 140 mg into the skin every 14 (fourteen) days. 2 mL 12   ezetimibe (ZETIA) 10 MG tablet Take 1 tablet (10 mg total) by mouth daily. 30 tablet 1   glucose blood (ACCU-CHEK GUIDE) test strip USE TO CHECK GBG 3 TIMES A DAY (Patient not taking: Reported on 07/12/2022) 100 strip 1   hydrocortisone 2.5 % ointment Apply topically 2 (two) times daily. Limit use to one week 30 g 0   levothyroxine (SYNTHROID) 125 MCG tablet Take 1 tablet (125 mcg total) by mouth daily before breakfast. Reported on 02/23/2016 90 tablet 1   lidocaine (LIDODERM) 5 % Place 1 patch onto the skin daily. Remove & Discard patch within 12 hours or as directed by MD (Patient not taking: Reported on 07/12/2022) 15 patch 0   metoprolol tartrate (LOPRESSOR) 25 MG tablet Take 0.5 tablets (12.5 mg total) by mouth 2 (two) times daily. 30 tablet 1   oxyCODONE (OXY IR/ROXICODONE) 5 MG immediate release tablet Take 1 tablet (5 mg total) by mouth every 6 (six) hours as needed for severe pain. (Patient not taking: Reported on 07/12/2022) 30 tablet 0   sertraline (ZOLOFT) 50 MG tablet Take 1 tablet (50 mg total) by mouth daily. For depression 30 tablet 3   traZODone (DESYREL) 50 MG tablet Take 1-2 tablets (50-100 mg total) by mouth at bedtime as needed for sleep. 60 tablet 3   No current facility-administered medications for this visit.     Musculoskeletal: Strength & Muscle Tone:  Unable to assess due telephone visit  Gait & Station:  Unable to assess due telephone visit Patient leans: N/A  Psychiatric  Specialty Exam: Review of Systems  Last menstrual period 06/13/2022.There is no height or weight on file to calculate BMI.  General Appearance: Unable to assess due telephone visit  Eye Contact:   Unable to assess due telephone visit  Speech:  Clear and Coherent and Normal Rate  Volume:  Normal  Mood:  Euthymic  Affect:  Appropriate, Congruent, and Tearful  Thought Process:  Coherent, Goal Directed and Linear  Orientation:  Full (Time, Place, and Person)  Thought Content: WDL and Logical   Suicidal Thoughts:  No  Homicidal Thoughts:  No  Memory:  Immediate;   Good Recent;   Good Remote;   Good  Judgement:  Good  Insight:  Good  Psychomotor Activity:   Unable to assess due telephone visit  Concentration:  Concentration: Good and Attention Span: Good  Recall:  Good  Fund of Knowledge: Good  Language: Good  Akathisia:  No  Handed:  Right  AIMS (if indicated): Not done  Assets:  Communication Skills Desire for Improvement Financial Resources/Insurance Housing Social Support  ADL's:  Intact  Cognition: WNL  Sleep:   Unable to assess due telephone visit   Screenings: [place] Admission (Discharged) from 03/15/2020 in [facility]  AIMS Total Score 0      AUDIT    Flowsheet Row Admission (Discharged) from 03/15/2020 in [facility]  Alcohol Use Disorder Identification Test Final Score (AUDIT) 1      GAD-7    Flowsheet Row Video Visit from 07/24/2022 in [facility] Video Visit from 04/20/2022 in [facility] Video Visit from 01/09/2022 in [facility] Video Visit from 07/14/2021 in [facility] Video Visit from 02/14/2021 in [facility]  Total GAD-7 Score '8 2 18 3 11      ' PHQ2-9    Flowsheet Row Video Visit from 07/24/2022 in [id number] Most recent reading at 07/24/2022 11:38 AM Video Visit from 04/20/2022 in [facility] Most recent reading at 04/20/2022  2:12 PM Office Visit from 04/04/2022 in [place] Most recent reading at 04/04/2022 11:20 AM ED from 01/18/2022 in [facility] Most recent reading at 01/18/2022  3:15 PM Office Visit from 01/18/2022 in [facility] Most recent reading at 01/18/2022 10:54 AM  PHQ-2 Total Score 1 0 0 5 3  PHQ-9 Total Score '5 1 3 17 12      ' Flowsheet Row ED from 06/29/2022 in [facility] Video Visit from [facility]/20/2022 in [facility] ED to Hosp-Admission (Discharged) from 03/13/2022 in [facility]E CV SURGICAL PROGRESSIVE CARE  C-SSRS RISK CATEGORY No Risk No Risk No Risk        Assessment and Plan: Patient notes notes that Wellbutrin has been ineffective in managing his tobacco dependence. She dislike the Nicorette gun and the patch. At this time Wellbutrin discontinued. He will will continue all other medications as prescribed.   1. Mild depression  Continue- ARIPiprazole (ABILIFY) 5 MG tablet; Take 1 tablet (5 mg total) by mouth daily.  Dispense: 30 tablet; Refill: 3 Continue- sertraline (ZOLOFT) 50 MG tablet; Take 1 tablet (50 mg total) by mouth daily. For depression  Dispense: 30 tablet; Refill: 3 Continue-  traZODone (DESYREL) 50 MG tablet; Take 1-2 tablets (50-100 mg total) by mouth at bedtime as needed for sleep.  Dispense: 60 tablet; Refill: 3   Follow-up in 3 months Follow-up with therapy  Salley Slaughter, NP 07/24/2022, 11:47 AM

## 2022-07-27 ENCOUNTER — Telehealth: Payer: Self-pay

## 2022-07-27 NOTE — Telephone Encounter (Signed)
Received VM from patient requesting returned call from nurse or Dr. Gwendlyn Deutscher.   Returned call to patient. Patient did not answer, LVM asking patient to return call to office.  Talbot Grumbling, RN

## 2022-08-14 ENCOUNTER — Other Ambulatory Visit: Payer: Self-pay

## 2022-08-14 ENCOUNTER — Ambulatory Visit: Payer: Medicaid Other | Attending: Nurse Practitioner | Admitting: Nurse Practitioner

## 2022-08-14 ENCOUNTER — Encounter: Payer: Self-pay | Admitting: Nurse Practitioner

## 2022-08-14 VITALS — BP 128/84 | HR 59 | Ht 67.0 in | Wt 241.0 lb

## 2022-08-14 DIAGNOSIS — I2510 Atherosclerotic heart disease of native coronary artery without angina pectoris: Secondary | ICD-10-CM | POA: Diagnosis present

## 2022-08-14 DIAGNOSIS — Z720 Tobacco use: Secondary | ICD-10-CM | POA: Diagnosis present

## 2022-08-14 DIAGNOSIS — I10 Essential (primary) hypertension: Secondary | ICD-10-CM | POA: Diagnosis present

## 2022-08-14 DIAGNOSIS — E118 Type 2 diabetes mellitus with unspecified complications: Secondary | ICD-10-CM | POA: Diagnosis present

## 2022-08-14 DIAGNOSIS — E039 Hypothyroidism, unspecified: Secondary | ICD-10-CM | POA: Diagnosis present

## 2022-08-14 DIAGNOSIS — E785 Hyperlipidemia, unspecified: Secondary | ICD-10-CM

## 2022-08-14 LAB — LIPID PANEL
Chol/HDL Ratio: 2.3 ratio (ref 0.0–4.4)
Cholesterol, Total: 93 mg/dL — ABNORMAL LOW (ref 100–199)
HDL: 40 mg/dL (ref >39)
LDL Chol Calc (NIH): 35 mg/dL (ref 0–99)
Triglycerides: 94 mg/dL (ref 0–149)
VLDL Cholesterol Cal: 18 mg/dL (ref 5–40)

## 2022-08-14 LAB — HEPATIC FUNCTION PANEL
ALT: 14 IU/L (ref 0–32)
AST: 15 IU/L (ref 0–40)
Albumin: 4.2 g/dL (ref 3.9–4.9)
Alkaline Phosphatase: 79 IU/L (ref 44–121)
Bilirubin Total: <0.2 mg/dL (ref 0.0–1.2)
Bilirubin, Direct: <0.1 mg/dL (ref 0.00–0.40)
Total Protein: 6.6 g/dL (ref 6.0–8.5)

## 2022-08-14 MED ORDER — METOPROLOL TARTRATE 25 MG PO TABS: 12.5000 mg | ORAL_TABLET | Freq: Two times a day (BID) | ORAL | 6 refills | Status: DC

## 2022-08-14 MED ORDER — CLOPIDOGREL BISULFATE 75 MG PO TABS: 75.0000 mg | ORAL_TABLET | Freq: Every day | ORAL | 6 refills | Status: DC

## 2022-08-14 NOTE — Patient Instructions (Signed)
Medication Instructions:  Your physician recommends that you continue on your current medications as directed. Please refer to the Current Medication list given to you today.   *If you need a refill on your cardiac medications before your next appointment, please call your pharmacy*   Lab Work: NONE ordered at this time of appointment   If you have labs (blood work) drawn today and your tests are completely normal, you will receive your results only by: [place] (if you have MyChart) OR A paper copy in the mail If you have any lab test that is abnormal or we need to change your treatment, we will call you to review the results.   Testing/Procedures: NONE ordered at this time of appointment     Follow-Up: At [facility], you and your health needs are our priority.  As part of our continuing mission to provide you with exceptional heart care, we have created designated Provider Care Teams.  These Care Teams include your primary Cardiologist (physician) and Advanced Practice Providers (APPs -  Physician Assistants and Nurse Practitioners) who all work together to provide you with the care you need, when you need it.  We recommend signing up for the patient portal called "MyChart".  Sign up information is provided on this After Visit Summary.  MyChart is used to connect with patients for Virtual Visits (Telemedicine).  Patients are able to view lab/test results, encounter notes, upcoming appointments, etc.  Non-urgent messages can be sent to your provider as well.   To learn more about what you can do with MyChart, go to [email or web address].    Your next appointment:   4-6 month(s)  The format for your next appointment:   In Person  Provider:   Glenetta Hew, MD     Other Instructions Monitor Blood pressure. Report blood pressure consistently greater than 130/80.  Important Information About Sugar

## 2022-08-14 NOTE — Progress Notes (Signed)
Office Visit    Patient Name: Tanya Nolan Date of Encounter: 08/14/2022  Primary Care Provider:  [name], MD Primary Cardiologist:  Glenetta Hew, MD  Chief Complaint    46 year old female with a history of CAD s/p NSTEMI, CABG x3, hypertension, hyperlipidemia, hypothyroidism, PCOS, tobacco use, Bell's Palsy, and chronic back pain who presents for follow-up related to CAD.   Past Medical History    Past Medical History:  Diagnosis Date   Abdominal pain 03/26/2009   Qualifier: Diagnosis of  By: Jerline Pain MD, [place], LUMBAR 03/26/2009   Qualifier: Diagnosis of  By: Jerline Pain MD, Anderson [place]     [place] 10/01/2007   Qualifier: Diagnosis of  By: Genene Churn MD, Jessica     Chronic back pain    tx with ibuprofen   Diabetes mellitus    Hypercholesteremia    Hyperlipidemia    no meds - tx with diet   Hypertension    Hypothyroidism    Ingrown toenail 12/25/2014   Ischemic heart disease due to coronary artery obstruction ([place]) 03/16/2022   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   C&B WNL  NEG PAPS after   MDD (major depressive disorder), recurrent severe, without psychosis ([place]) 03/15/2020   Nipple discharge 10/13/2013   Nodule of skin of left foot 04/15/2021   Non compliance w medication regimen 10/13/2013   Simple endometrial hyperplasia 09/2006   BENIGN SECRETORY 02/2007   Thyroid dysfunction    Past Surgical History:  Procedure Laterality Date   CORONARY ARTERY BYPASS GRAFT N/A 03/17/2022   Procedure: CORONARY ARTERY BYPASS GRAFTING X THREE, USING LEFT INTERNAL MAMMARY ARTERY, LEFT RADIAL ARTERY, RIGHT GREATER SAPHENOUS VEIN HARVESTED ENDOSCOPICALLY;  Surgeon: [name], MD;  Location: [facility];  Service: Open Heart Surgery;  Laterality: N/A;   [place] DECOMPRESSION  2008   HYSTEROSCOPY X 2  2007 / 2012   ENDOMETRIAL POLYPS REMOVED   LEFT HEART CATH AND CORONARY ANGIOGRAPHY N/A 03/15/2022   Procedure: LEFT HEART CATH AND CORONARY  ANGIOGRAPHY;  Surgeon: Burnell Blanks, MD;  Location: [facility];  Service: Cardiovascular;  Laterality: N/A;   RADIAL ARTERY HARVEST Left 03/17/2022   Procedure: RADIAL ARTERY HARVEST;  Surgeon: [name], MD;  Location: [facility];  Service: Open Heart Surgery;  Laterality: Left;   TEE WITHOUT CARDIOVERSION N/A 03/17/2022   Procedure: TRANSESOPHAGEAL ECHOCARDIOGRAM (TEE);  Surgeon: [name], MD;  Location: [facility];  Service: Open Heart Surgery;  Laterality: N/A;    Allergies  Allergies  Allergen Reactions   Metformin Diarrhea    History of Present Illness    47 year old female with the above past medical history including CAD s/p NSTEMI, CABG x3, hypertension, hyperlipidemia, hypothyroidism, PCOS, tobacco use, Bell's Palsy, and chronic back pain.   She presented to the ED on 03/13/2022 with complaints of seizure-like activity associated with urinary incontinence and tongue biting, unresponsiveness, and chest pain during intercourse.  Patient did not recall the events. Troponin was elevated, chest x-ray was unremarkable, EKG was overall normal, MRI of head and EEG were negative.  She did report a 1 month history of chest pain on exertion prior to her ED visit which she described as a sharp pain often relieved by rest. She was hospitalized in the setting of NSTEMI. Cardiology was consulted. Echocardiogram showed EF 60 to 65%, normal LV function, no RWMA, moderate concentric LVH, indeterminate diastolic parameters. Coronary CT angiogram showed calcium  score of 709, 99th percentile for age and sex matched control.  CT FFR analysis showed flow-limiting lesions in the LAD and left circumflex, with total occlusion in the proximal RCA. Follow-up cardiac catheterization on 03/15/2022 revealed pRCA 100%, p-mLCx, OM2 90%, pLAD 80%, mLAD 80% stenosed.  CT surgery was consulted and she underwent CABG x3 (LIMA-LAD, Left radial artery graft-OM, and SVG-PDA) on 03/17/2022.  She was referred to lipid  clinic Pharm.D. and was ultimately started on Repatha.  She presented to the ED on 06/29/2022 with complaints of left facial drooping, chest pain.  Troponin was negative, D-dimer was mildly elevated, CT angio of the chest was negative for acute findings. EKG was unremarkable. BP was elevated.  She was treated for Bell's palsy and discharged home in stable condition.   She was last seen in the office on 07/12/2022 and was stable from a cardiac standpoint.  BP was elevated. She was restarted on amlodipine 5 mg daily.  She presents today for follow-up.  Since her last visit she has been stable from a cardiac standpoint.  She denies any symptoms concerning for angina.  She has not been checking her BP at home.  She continues to smoke.  Overall, she reports feeling well and denies any new concerns today.  Home Medications    Current Outpatient Medications  Medication Sig Dispense Refill   Accu-Chek FastClix Lancets MISC Use to check CBG TID 100 each 12   acetaminophen (TYLENOL) 500 MG tablet Take 1,000 mg by mouth every 6 (six) hours as needed for moderate pain or headache.     albuterol (PROAIR HFA) 108 (90 Base) MCG/ACT inhaler Inhale 1-2 puffs into the lungs every 6 (six) hours as needed for wheezing or shortness of breath. 18 g 1   amLODipine (NORVASC) 5 MG tablet Take 1 tablet (5 mg total) by mouth daily. 90 tablet 3   ARIPiprazole (ABILIFY) 5 MG tablet Take 1 tablet (5 mg total) by mouth daily. 30 tablet 3   artificial tears (LACRILUBE) OINT ophthalmic ointment Place into the left eye at bedtime as needed for dry eyes. 3.5 g 0   aspirin EC 81 MG EC tablet Take 1 tablet (81 mg total) by mouth daily. Swallow whole. 30 tablet 11   atorvastatin (LIPITOR) 80 MG tablet Take 1 tablet (80 mg total) by mouth daily. 30 tablet 1   Blood Glucose Monitoring Suppl (ACCU-CHEK GUIDE) w/Device KIT 1 applicator by Does not apply route 3 (three) times daily. Use to check CBG TID 1 kit 0   cetirizine (ZYRTEC) 10 MG  tablet Take 1 tablet (10 mg total) by mouth daily as needed for allergies.     empagliflozin (JARDIANCE) 10 MG TABS tablet Take 1 tablet (10 mg total) by mouth daily. 90 tablet 3   Evolocumab (REPATHA SURECLICK) 947 MG/ML SOAJ Inject 140 mg into the skin every 14 (fourteen) days. 2 mL 12   ezetimibe (ZETIA) 10 MG tablet Take 1 tablet (10 mg total) by mouth daily. 30 tablet 1   glucose blood (ACCU-CHEK GUIDE) test strip USE TO CHECK GBG 3 TIMES A DAY 100 strip 1   hydrocortisone 2.5 % ointment Apply topically 2 (two) times daily. Limit use to one week 30 g 0   levothyroxine (SYNTHROID) 125 MCG tablet Take 1 tablet (125 mcg total) by mouth daily before breakfast. Reported on 02/23/2016 90 tablet 1   lidocaine (LIDODERM) 5 % Place 1 patch onto the skin daily. Remove & Discard patch within 12 hours or  as directed by MD 15 patch 0   oxyCODONE (OXY IR/ROXICODONE) 5 MG immediate release tablet Take 1 tablet (5 mg total) by mouth every 6 (six) hours as needed for severe pain. 30 tablet 0   sertraline (ZOLOFT) 50 MG tablet Take 1 tablet (50 mg total) by mouth daily. For depression 30 tablet 3   traZODone (DESYREL) 50 MG tablet Take 1-2 tablets (50-100 mg total) by mouth at bedtime as needed for sleep. 60 tablet 3   clopidogrel (PLAVIX) 75 MG tablet Take 1 tablet (75 mg total) by mouth daily. 30 tablet 6   metoprolol tartrate (LOPRESSOR) 25 MG tablet Take 0.5 tablets (12.5 mg total) by mouth 2 (two) times daily. 30 tablet 6   No current facility-administered medications for this visit.     Review of Systems   She denies chest pain, palpitations, dyspnea, pnd, orthopnea, n, v, dizziness, syncope, edema, weight gain, or early satiety. All other systems reviewed and are otherwise negative except as noted above.    Cardiac Rehabilitation Eligibility Assessment  The patient is ready to start cardiac rehabilitation from a cardiac standpoint.    Physical Exam    VS:  BP 128/84   Pulse (!) 59   Ht '5\' 7"'   (1.702 m)   Wt 241 lb (109.3 kg)   SpO2 100%   BMI 37.75 kg/m   GEN: Well nourished, well developed, in no acute distress. HEENT: normal. Neck: Supple, no JVD, carotid bruits, or masses. Cardiac: RRR, no murmurs, rubs, or gallops. No clubbing, cyanosis, edema.  Radials/DP/PT 2+ and equal bilaterally.  Respiratory:  Respirations regular and unlabored, clear to auscultation bilaterally. GI: Soft, nontender, nondistended, BS + x 4. MS: no deformity or atrophy. Skin: warm and dry, no rash. Neuro:  Strength and sensation are intact. Psych: Normal affect.  Accessory Clinical Findings    ECG personally reviewed by me today - No EKG in office today.     Lab Results  Component Value Date   WBC 9.5 06/29/2022   HGB 12.4 06/29/2022   HCT 37.0 06/29/2022   MCV 83.9 06/29/2022   PLT 247 06/29/2022   Lab Results  Component Value Date   CREATININE 0.81 06/29/2022   BUN 5 (L) 06/29/2022   NA 138 06/29/2022   K 3.7 06/29/2022   CL 105 06/29/2022   CO2 27 06/29/2022   Lab Results  Component Value Date   ALT 19 03/13/2022   AST 25 03/13/2022   ALKPHOS 59 03/13/2022   BILITOT 0.4 03/13/2022   Lab Results  Component Value Date   CHOL 118 03/14/2022   HDL 25 (L) 03/14/2022   LDLCALC 73 03/14/2022   LDLDIRECT 65 01/05/2021   TRIG 100 03/14/2022   CHOLHDL 4.7 03/14/2022    Lab Results  Component Value Date   HGBA1C 6.6 01/18/2022    Assessment & Plan    1. CAD: S/p CABG x3 (LIMA-LAD, Left radial artery graft-OM, and SVG-PDA) on 03/17/2022. Echo showed EF 60 to 65%, normal LV function, no RWMA, moderate concentric LVH, indeterminate diastolic parameters. Stable with no anginal symptoms. Continue aspirin, Plavix, amlodipine, metoprolol, Lipitor, Zetia, and Repatha.   2. Hypertension: BP at goal in office today. She has not been checking her BP at home. Continue to monitor BP and report BP consistently > 130/80.  Otherwise, continue current antihypertensive regimen.   3.  Hyperlipidemia: LDL was 73 in 03/2022. Due for repeat labs. She will have these drawn today. Continue Repatha, Lipitor, Zetia.  4. Type 2 diabetes: A1c was 6.6 in March 2023.  Monitored and managed per PCP.   5. Hypothyroidism: TSH was 1.46 in May 2023. Continue synthroid.   6. Tobacco use: She continues to smoke, but has cut down.  Full cessation advised.  7.  Disposition: Follow-up in 4-6 months.      Lenna Sciara, NP 08/14/2022, 12:10 PM

## 2022-08-16 ENCOUNTER — Telehealth (HOSPITAL_COMMUNITY): Payer: Self-pay

## 2022-08-16 NOTE — Telephone Encounter (Signed)
No response from pt regarding CR.  Closed referral.  

## 2022-08-22 ENCOUNTER — Telehealth: Payer: Self-pay | Admitting: Family Medicine

## 2022-08-22 ENCOUNTER — Other Ambulatory Visit (HOSPITAL_COMMUNITY)
Admission: RE | Admit: 2022-08-22 | Discharge: 2022-08-22 | Disposition: A | Discharge status: Home / Self Care | Payer: Medicaid Other | Source: Ambulatory Visit | Attending: Family Medicine | Admitting: Family Medicine

## 2022-08-22 ENCOUNTER — Ambulatory Visit (INDEPENDENT_AMBULATORY_CARE_PROVIDER_SITE_OTHER): Payer: Medicaid Other | Admitting: Family Medicine

## 2022-08-22 ENCOUNTER — Encounter: Payer: Self-pay | Admitting: Family Medicine

## 2022-08-22 VITALS — BP 134/82 | HR 74 | Ht 67.0 in | Wt 246.4 lb

## 2022-08-22 DIAGNOSIS — Z1231 Encounter for screening mammogram for malignant neoplasm of breast: Secondary | ICD-10-CM

## 2022-08-22 DIAGNOSIS — E118 Type 2 diabetes mellitus with unspecified complications: Secondary | ICD-10-CM

## 2022-08-22 DIAGNOSIS — Z0000 Encounter for general adult medical examination without abnormal findings: Secondary | ICD-10-CM | POA: Diagnosis present

## 2022-08-22 DIAGNOSIS — Z23 Encounter for immunization: Secondary | ICD-10-CM

## 2022-08-22 DIAGNOSIS — Z113 Encounter for screening for infections with a predominantly sexual mode of transmission: Secondary | ICD-10-CM | POA: Diagnosis present

## 2022-08-22 LAB — POCT GLYCOSYLATED HEMOGLOBIN (HGB A1C): HbA1c, POC (controlled diabetic range): 9.2 % — AB (ref 0.0–7.0)

## 2022-08-22 NOTE — Telephone Encounter (Signed)
Clinical info completed on Physical  form.  Placed form in PCP's box for completion.    When form is completed, please route note to "RN Team" and place in wall pocket in front office.   Salvatore Marvel, CMA

## 2022-08-22 NOTE — Telephone Encounter (Signed)
Patient dropped off form at front desk for Physical Examination .  Verified that patient section of form has been completed.  Last DOS/WCC with PCP was 08/22/22.  Placed form in blue team folder to be completed by clinical staff.  Tanya Nolan

## 2022-08-22 NOTE — Progress Notes (Signed)
    SUBJECTIVE:   Chief compliant/HPI: annual examination  Tanya Nolan is a 46 y.o. who presents today for an annual exam.  DM: Poor compliance with Jardiance HTN: Compliant with meds  Review of systems form notable for N/A.   Updated history tabs and problem list reviewed.   OBJECTIVE:   Vitals:   08/22/22 1001 08/22/22 1022  BP: (!) 145/85 134/82  Pulse: 74   SpO2: 100%   Weight: 246 lb 6.4 oz (111.8 kg)   Height: _0  (1.702 m)     Physical Exam Vitals and nursing note reviewed.  HENT:     Head: Normocephalic.     Right Ear: Tympanic membrane and ear canal normal.     Left Ear: Tympanic membrane and ear canal normal.  Eyes:     Extraocular Movements: Extraocular movements intact.     Conjunctiva/sclera: Conjunctivae normal.     Pupils: Pupils are equal, round, and reactive to light.  Cardiovascular:     Rate and Rhythm: Normal rate and regular rhythm.     Heart sounds: No murmur heard. Pulmonary:     Effort: Pulmonary effort is normal. No respiratory distress.     Breath sounds: Normal breath sounds. No wheezing.  Abdominal:     General: Abdomen is flat. Bowel sounds are normal. There is no distension.     Palpations: Abdomen is soft. There is no mass.     Tenderness: There is no abdominal tenderness.  Musculoskeletal:        General: Normal range of motion.     Cervical back: Neck supple.  Neurological:     General: No focal deficit present.     Mental Status: She is oriented to person, place, and time.  Psychiatric:        Mood and Affect: Mood normal.        Behavior: Behavior normal.        Thought Content: Thought content normal.        Judgment: Judgment normal.      ASSESSMENT/PLAN:   Annual Examination  See AVS for age appropriate recommendations.   PHQ score 5, reviewed and discussed. Blood pressure reviewed and at goal <140/90.  Asked about intimate partner violence and patient reports: No concern.  The patient currently uses nothing  for contraception. She stated that she can't get pregnant and if she does she welcomes the baby.   Considered the following items based upon USPSTF recommendations: HIV testing: discussed and up to date Hepatitis C: discussed and up to date Syphilis if at high risk: discussed GC/CT  ordered Lipid panel (nonfasting or fasting) discussed based upon AHA recommendations and not ordered. Reviewed risk factors for latent tuberculosis and not indicated  Discussed family history, BRCA testing not indicated.  Mammogram ordered   Cervical cancer screening: prior Pap reviewed, repeat due in 2025 Immunizations Flu shot today   DM2: Poor compliance with meds. Counseling provided. F/U in 3 months for reassessment.   HTN: BP initially elevated during this visit, but improved later. No medication adjustment recommended.  Follow up in 1  year or sooner if indicated.      Andrena Mews, MD [place]

## 2022-08-22 NOTE — Patient Instructions (Signed)
Mammogram A mammogram is an X-ray of the breasts. This is done to check for changes that are not normal. This test can look for changes that may be caused by breast cancer or other problems. Mammograms are regularly done on women beginning at age 44. A man may have a mammogram if he has a lump or swelling in his breast. Tell a doctor: About any allergies you have. If you have breast implants. If you have had breast disease, biopsy, or surgery. If you have a family history of breast cancer. If you are breastfeeding. Whether you are pregnant or may be pregnant. What are the risks? Generally, this is a safe procedure. But problems may occur, including: Being exposed to radiation. Radiation levels are very low with this test. The need for more tests. The results were not read properly. Trouble finding breast cancer in women with dense breasts. What happens before the test? Have this test done about 1-2 weeks after your menstrual period. This is often when your breasts are the least tender. If you are visiting a new doctor or clinic, have any past mammogram images sent to your new doctor's office. Wash your breasts and under your arms on the day of the test. Do not use deodorants, perfumes, lotions, or powders on the day of the test. Take off any jewelry from your neck. Wear clothes that you can change into and out of easily. What happens during the test?  You will take off your clothes from the waist up. You will put on a gown. You will stand in front of the X-ray machine. Each breast will be placed between two plastic or glass plates. The plates will press down on your breast for a few seconds. Try to relax. This does not cause any harm to your breasts. It may not feel comfortable, but it will be very brief. X-rays will be taken from different angles of each breast. The procedure may vary among doctors and hospitals. What can I expect after the test? The mammogram will be read by a  specialist (radiologist). You may need to do parts of the test again. This depends on the quality of the images. You may go back to your normal activities. It is up to you to get the results of your test. Ask how to get your results when they are ready. Summary A mammogram is an X-ray of the breasts. It looks for changes that may be caused by breast cancer or other problems. A man may have this test if he has a lump or swelling in his breast. Before the test, tell your doctor about any breast problems that you have had in the past. Have this test done about 1-2 weeks after your menstrual period. Ask when your test results will be ready. Make sure you get your test results. This information is not intended to replace advice given to you by your health care provider. Make sure you discuss any questions you have with your health care provider. Document Revised: 07/13/2021 Document Reviewed: 08/30/2020 Elsevier Patient Education  2023 [employer] Inc.  

## 2022-08-23 ENCOUNTER — Other Ambulatory Visit: Payer: Self-pay | Admitting: Family Medicine

## 2022-08-23 DIAGNOSIS — Z111 Encounter for screening for respiratory tuberculosis: Secondary | ICD-10-CM

## 2022-08-23 LAB — URINE CYTOLOGY ANCILLARY ONLY
Bacterial Vaginitis-Urine: POSITIVE — AB
Candida Urine: NEGATIVE
Chlamydia: NEGATIVE
Comment: NEGATIVE
Comment: NEGATIVE
Comment: NORMAL
Neisseria Gonorrhea: NEGATIVE
Trichomonas: POSITIVE — AB

## 2022-08-23 MED ORDER — METRONIDAZOLE 500 MG PO TABS: 500.0000 mg | ORAL_TABLET | Freq: Two times a day (BID) | ORAL | 0 refills | Status: AC

## 2022-08-23 NOTE — Telephone Encounter (Signed)
Lab appointment scheduled for 08/24/22   +BV and Trich discussed. I advised her to encourage her partner to get tested and treated. Flagyl escribed. All questions were answered.

## 2022-08-24 ENCOUNTER — Other Ambulatory Visit: Payer: Medicaid Other

## 2022-08-24 DIAGNOSIS — Z111 Encounter for screening for respiratory tuberculosis: Secondary | ICD-10-CM

## 2022-08-27 LAB — QUANTIFERON-TB GOLD PLUS
QuantiFERON Mitogen Value: >10 IU/mL
QuantiFERON Nil Value: 0.02 IU/mL
QuantiFERON TB1 Ag Value: 0.06 IU/mL
QuantiFERON TB2 Ag Value: 0.03 IU/mL
QuantiFERON-TB Gold Plus: NEGATIVE

## 2022-08-28 NOTE — Telephone Encounter (Signed)
Patient called and informed that forms are ready for pick up. Copy made and placed in batch scanning. Original placed at front desk for pick up.   Taheera Thomann C Izabellah Dadisman, RN  

## 2022-08-28 NOTE — Telephone Encounter (Signed)
School physical form completed and placed in RN's box.

## 2022-10-13 ENCOUNTER — Ambulatory Visit
Admission: RE | Admit: 2022-10-13 | Discharge: 2022-10-13 | Disposition: A | Discharge status: Home / Self Care | Payer: Medicaid Other | Source: Ambulatory Visit | Attending: Family Medicine | Admitting: Family Medicine

## 2022-10-13 DIAGNOSIS — Z1231 Encounter for screening mammogram for malignant neoplasm of breast: Secondary | ICD-10-CM

## 2022-10-26 ENCOUNTER — Encounter (HOSPITAL_COMMUNITY): Payer: Self-pay

## 2022-10-26 ENCOUNTER — Telehealth (HOSPITAL_COMMUNITY): Payer: Medicaid Other | Admitting: Psychiatry

## 2022-11-30 ENCOUNTER — Other Ambulatory Visit: Payer: Self-pay

## 2022-11-30 ENCOUNTER — Encounter (HOSPITAL_BASED_OUTPATIENT_CLINIC_OR_DEPARTMENT_OTHER): Payer: Self-pay | Admitting: Urology

## 2022-11-30 ENCOUNTER — Emergency Department (HOSPITAL_BASED_OUTPATIENT_CLINIC_OR_DEPARTMENT_OTHER)
Admission: EM | Admit: 2022-11-30 | Discharge: 2022-12-01 | Disposition: A | Discharge status: Home / Self Care | Payer: Medicaid Other | Attending: Emergency Medicine | Admitting: Emergency Medicine

## 2022-11-30 ENCOUNTER — Emergency Department (HOSPITAL_BASED_OUTPATIENT_CLINIC_OR_DEPARTMENT_OTHER): Payer: Medicaid Other

## 2022-11-30 DIAGNOSIS — Z794 Long term (current) use of insulin: Secondary | ICD-10-CM | POA: Diagnosis not present

## 2022-11-30 DIAGNOSIS — Z79899 Other long term (current) drug therapy: Secondary | ICD-10-CM | POA: Diagnosis not present

## 2022-11-30 DIAGNOSIS — E119 Type 2 diabetes mellitus without complications: Secondary | ICD-10-CM | POA: Insufficient documentation

## 2022-11-30 DIAGNOSIS — Z7982 Long term (current) use of aspirin: Secondary | ICD-10-CM | POA: Insufficient documentation

## 2022-11-30 DIAGNOSIS — R079 Chest pain, unspecified: Secondary | ICD-10-CM | POA: Diagnosis present

## 2022-11-30 DIAGNOSIS — Z7902 Long term (current) use of antithrombotics/antiplatelets: Secondary | ICD-10-CM | POA: Insufficient documentation

## 2022-11-30 DIAGNOSIS — I10 Essential (primary) hypertension: Secondary | ICD-10-CM | POA: Insufficient documentation

## 2022-11-30 LAB — CBC
HCT: 41.3 % (ref 36.0–46.0)
Hemoglobin: 13.7 g/dL (ref 12.0–15.0)
MCH: 28.9 pg (ref 26.0–34.0)
MCHC: 33.2 g/dL (ref 30.0–36.0)
MCV: 87.1 fL (ref 80.0–100.0)
Platelets: 234 10*3/uL (ref 150–400)
RBC: 4.74 MIL/uL (ref 3.87–5.11)
RDW: 14.3 % (ref 11.5–15.5)
WBC: 9.9 10*3/uL (ref 4.0–10.5)
nRBC: 0 % (ref 0.0–0.2)

## 2022-11-30 LAB — BASIC METABOLIC PANEL
Anion gap: 8 (ref 5–15)
BUN: 8 mg/dL (ref 6–20)
CO2: 25 mmol/L (ref 22–32)
Calcium: 8.6 mg/dL — ABNORMAL LOW (ref 8.9–10.3)
Chloride: 103 mmol/L (ref 98–111)
Creatinine, Ser: 0.72 mg/dL (ref 0.44–1.00)
GFR, Estimated: >60 mL/min (ref >60)
Glucose, Bld: 154 mg/dL — ABNORMAL HIGH (ref 70–99)
Potassium: 3.8 mmol/L (ref 3.5–5.1)
Sodium: 136 mmol/L (ref 135–145)

## 2022-11-30 LAB — TROPONIN I (HIGH SENSITIVITY)
Troponin I (High Sensitivity): 9 ng/L (ref <18)
Troponin I (High Sensitivity): 8 ng/L (ref <18)

## 2022-11-30 MED ORDER — KETOROLAC TROMETHAMINE 30 MG/ML IJ SOLN
30.0000 mg | Freq: Once | INTRAMUSCULAR | Status: AC
  Administered 2022-11-30: 30 mg via INTRAVENOUS
  Filled 2022-11-30: qty 1

## 2022-11-30 NOTE — ED Triage Notes (Signed)
Pt states chest pain that is been intermittent throughout the day  Denies any SOB at this time , denies pain at this time  Seen by EMS and given Nitro SL x1, ASA 324  CABG 03/17/2022

## 2022-11-30 NOTE — ED Notes (Signed)
Patient transported to X-ray 

## 2022-11-30 NOTE — ED Notes (Signed)
ED Provider at bedside. 

## 2022-11-30 NOTE — ED Notes (Signed)
Pt denies chest pain, shob at this time. C/o mild nausea.  Call bell within reach, will continue to monitor.

## 2022-11-30 NOTE — ED Provider Notes (Signed)
[facility] Provider Note   CSN: [id number] Arrival date & time: 11/30/22  2016     History  Chief Complaint  Patient presents with   Chest Pain    Tanya Nolan is a 47 y.o. female.  The history is provided by the patient.  Chest Pain She has history of hypertension, diabetes, hyperlipidemia, coronary artery disease status post three-vessel coronary artery bypass and comes in complaining of intermittent chest pain since this afternoon.  Pain is left-sided and described as a dull, achy feeling.  Pain will last for 5-10 seconds before resolving.  She denies dyspnea but did have some nausea and did feel clammy although she did not have any overt sweats.  Pain is different from what she had prior to her bypass surgery.  She has not done anything to treat it.  Pain is not exertional.  Of note, she does continue to smoke.  She states that she missed taking all of her medications this morning.  Other than that, she states she has been compliant with her medications.  She is not monitoring her blood pressure at home as she was advised to do.   Home Medications Prior to Admission medications   Medication Sig Start Date End Date Taking? Authorizing Provider  Accu-Chek FastClix Lancets MISC Use to check CBG TID 07/22/19   [name], MD  acetaminophen (TYLENOL) 500 MG tablet Take 1,000 mg by mouth every 6 (six) hours as needed for moderate pain or headache. Patient not taking: Reported on 08/22/2022    [provider]  albuterol (PROAIR HFA) 108 (90 Base) MCG/ACT inhaler Inhale 1-2 puffs into the lungs every 6 (six) hours as needed for wheezing or shortness of breath. Patient not taking: Reported on 08/22/2022 05/20/21   [name], MD  amLODipine (NORVASC) 5 MG tablet Take 1 tablet (5 mg total) by mouth daily. 07/12/22   Lenna Sciara, NP  ARIPiprazole (ABILIFY) 5 MG tablet Take 1 tablet (5 mg total) by mouth daily. 07/24/22   Salley Slaughter,  NP  artificial tears (LACRILUBE) OINT ophthalmic ointment Place into the left eye at bedtime as needed for dry eyes. 06/29/22   Hayden Rasmussen, MD  aspirin EC 81 MG EC tablet Take 1 tablet (81 mg total) by mouth daily. Swallow whole. 03/21/22   Nani Skillern, PA-C  atorvastatin (LIPITOR) 80 MG tablet Take 1 tablet (80 mg total) by mouth daily. 03/21/22   Nani Skillern, PA-C  Blood Glucose Monitoring Suppl (ACCU-CHEK GUIDE) w/Device KIT 1 applicator by Does not apply route 3 (three) times daily. Use to check CBG TID 07/22/19   [name], MD  cetirizine (ZYRTEC) 10 MG tablet Take 1 tablet (10 mg total) by mouth daily as needed for allergies. Patient not taking: Reported on 08/22/2022 03/21/22   Nani Skillern, PA-C  clopidogrel (PLAVIX) 75 MG tablet Take 1 tablet (75 mg total) by mouth daily. 08/14/22   Lenna Sciara, NP  empagliflozin (JARDIANCE) 10 MG TABS tablet Take 1 tablet (10 mg total) by mouth daily. 04/04/22   [name], MD  Evolocumab (REPATHA SURECLICK) 623 MG/ML SOAJ Inject 140 mg into the skin every 14 (fourteen) days. 05/08/22   Leonie Man, MD  ezetimibe (ZETIA) 10 MG tablet Take 1 tablet (10 mg total) by mouth daily. 03/21/22   Lars Pinks M, PA-C  glucose blood (ACCU-CHEK GUIDE) test strip USE TO CHECK GBG 3 TIMES A DAY 04/13/21  Simmons-Robinson, Makiera, MD  hydrocortisone 2.5 % ointment Apply topically 2 (two) times daily. Limit use to one week Patient not taking: Reported on 08/22/2022 03/21/22   Nani Skillern, PA-C  levothyroxine (SYNTHROID) 125 MCG tablet Take 1 tablet (125 mcg total) by mouth daily before breakfast. Reported on 02/23/2016 01/18/22   [name], MD  lidocaine (LIDODERM) 5 % Place 1 patch onto the skin daily. Remove & Discard patch within 12 hours or as directed by MD Patient not taking: Reported on 08/22/2022 03/21/22   Nani Skillern, PA-C  metoprolol tartrate (LOPRESSOR) 25 MG tablet Take 0.5 tablets  (12.5 mg total) by mouth 2 (two) times daily. 08/14/22   Lenna Sciara, NP  sertraline (ZOLOFT) 50 MG tablet Take 1 tablet (50 mg total) by mouth daily. For depression 07/24/22   Salley Slaughter, NP  traZODone (DESYREL) 50 MG tablet Take 1-2 tablets (50-100 mg total) by mouth at bedtime as needed for sleep. Patient not taking: Reported on 08/22/2022 07/24/22   Salley Slaughter, NP  insulin lispro protamine-insulin lispro (HUMALOG 75/25) (75-25) 100 UNIT/ML SUSP Inject 40-50 Units into the skin 2 (two) times daily with a meal. Patient takes 40 units in the morning and 50 units at night.  Verified it is Humalog 75/25.  04/18/12  [provider]      Allergies    Metformin    Review of Systems   Review of Systems  Cardiovascular:  Positive for chest pain.  All other systems reviewed and are negative.   Physical Exam Updated Vital Signs BP (!) 159/88   Pulse 81   Temp 100 F (37.8 C) (Oral)   Resp (!) 22   Ht '5\' 7"'$  (1.702 m)   Wt 111.8 kg   SpO2 100%   BMI 38.60 kg/m  Physical Exam Vitals and nursing note reviewed.   47 year old female, resting comfortably and in no acute distress. Vital signs are significant for elevated blood pressure and borderline elevated respiratory rate. Oxygen saturation is 100%, which is normal. Head is normocephalic and atraumatic. PERRLA, EOMI. Oropharynx is clear. Neck is nontender and supple without adenopathy or JVD. Back is nontender and there is no CVA tenderness. Lungs are clear without rales, wheezes, or rhonchi. Chest is moderately tender in the left anterior chest wall without crepitus.  This does not reproduce her pain. Heart has regular rate and rhythm without murmur. Abdomen is soft, flat, nontender. Extremities have no cyanosis or edema, full range of motion is present. Skin is warm and dry without rash. Neurologic: Mental status is normal, cranial nerves are intact, moves all extremities equally.  ED Results / Procedures /  Treatments   Labs (all labs ordered are listed, but only abnormal results are displayed) Labs Reviewed  BASIC METABOLIC PANEL - Abnormal; Notable for the following components:      Result Value   Glucose, Bld 154 (*)    Calcium 8.6 (*)    All other components within normal limits  CBC  PREGNANCY, URINE  TROPONIN I (HIGH SENSITIVITY)  TROPONIN I (HIGH SENSITIVITY)    EKG FAHIMA, CIFELLI [id number] 30-Nov-2022 20:22:13 [facility]-NLD ROUTINE RECORD April 15, 1976 (55 yr) Female Black Room:HPTRI1 Loc:0 Technician: [phone] Test JQG:BEEFE pain Vent. rate 88 BPM PR interval 142 ms QRS duration 79 ms QT/QTcB 376/455 ms P-R-T axes 33 24 71 Sinus rhythm Probable left atrial enlargement Borderline T wave abnormalities When compared with ECG of 06/29/2022, No significant change was found Confirmed  by Delora Fuel ([id number]) on 11/30/2022 11:03:32 PM Confirmed By: Delora Fuel  Radiology DG Chest 2 View  Result Date: 11/30/2022 CLINICAL DATA:  Chest pain EXAM: CHEST - 2 VIEW COMPARISON:  04/19/2022 FINDINGS: Frontal and lateral views of the chest demonstrate an unremarkable cardiac silhouette. Postsurgical changes from median sternotomy. No airspace disease, effusion, or pneumothorax. No acute bony abnormalities. IMPRESSION: 1. No acute intrathoracic process. Electronically Signed   By: Randa Ngo M.D.   On: 11/30/2022 20:35    Procedures Procedures  Cardiac monitor shows normal sinus rhythm, per my interpretation.  Medications Ordered in ED Medications  ketorolac (TORADOL) 30 MG/ML injection 30 mg (30 mg Intravenous Given 11/30/22 2315)    ED Course/ Medical Decision Making/ A&P                             Medical Decision Making Amount and/or Complexity of Data Reviewed Labs: ordered. Radiology: ordered.  Risk Prescription drug management.   Chest pain in patient with known history of coronary artery disease.  However, pain is quite atypical, doubt ACS.   Consider chest wall pain.  Doubt pneumonia, pulmonary embolism, thoracic aneurysm.  I have reviewed and interpreted her electrocardiogram, and my interpretation is nonspecific T wave flattening which is unchanged from prior ECG.  Chest x-ray shows no acute cardiopulmonary process.  I have independently viewed the images, and agree with radiologist's interpretation.  I have reviewed and interpreted her laboratory tests, and my interpretation is normal troponin, mildly elevated random glucose consistent with known history of diabetes, normal CBC.  Delta troponin is pending.  I have reviewed her old records, and she had three-vessel coronary artery bypass graft on 03/15/2022.  At last office visit on 08/14/2022, she was stable from cardiology standpoint with no chest pain and blood pressure at goal.  She was advised to stop smoking and to monitor her blood pressure at home.  She has done neither of those.  At this point, I have very low index of suspicion that her pain is cardiac.  I am waiting for second troponin.  In the meantime, I have ordered a dose of ketorolac.  Repeat troponin is normal and unchanged.  She had good relief of pain with ketorolac.  On reexam, chest wall tenderness is markedly decreased.  No evidence of cardiac cause of pain.  She is discharged with instructions to use over-the-counter NSAIDs and acetaminophen.  She is to keep her routine follow-up with cardiology.  Final Clinical Impression(s) / ED Diagnoses Final diagnoses:  Nonspecific chest pain  Elevated blood pressure reading with diagnosis of hypertension    Rx / DC Orders ED Discharge Orders     None         Delora Fuel, MD 59/93/57 [phone]

## 2022-12-01 ENCOUNTER — Other Ambulatory Visit: Payer: Self-pay

## 2022-12-01 NOTE — Discharge Instructions (Addendum)
Take ibuprofen or naproxen as needed for pain.  To get additional pain relief, you may add acetaminophen.  Apply ice to the sore area of your chest.  Ice can be applied for 30 minutes at a time, 4 times a day.  Follow-up with your cardiologist as scheduled.

## 2023-01-21 ENCOUNTER — Other Ambulatory Visit: Payer: Self-pay | Admitting: Physician Assistant

## 2023-02-06 ENCOUNTER — Ambulatory Visit (INDEPENDENT_AMBULATORY_CARE_PROVIDER_SITE_OTHER): Payer: Medicaid Other | Admitting: Student

## 2023-02-06 ENCOUNTER — Other Ambulatory Visit (HOSPITAL_COMMUNITY)
Admission: RE | Admit: 2023-02-06 | Discharge: 2023-02-06 | Disposition: A | Discharge status: Home / Self Care | Payer: Medicaid Other | Source: Ambulatory Visit | Attending: Family Medicine | Admitting: Family Medicine

## 2023-02-06 VITALS — BP 140/76 | HR 66 | Wt 242.0 lb

## 2023-02-06 DIAGNOSIS — Z113 Encounter for screening for infections with a predominantly sexual mode of transmission: Secondary | ICD-10-CM

## 2023-02-06 DIAGNOSIS — E1159 Type 2 diabetes mellitus with other circulatory complications: Secondary | ICD-10-CM

## 2023-02-06 DIAGNOSIS — I152 Hypertension secondary to endocrine disorders: Secondary | ICD-10-CM | POA: Diagnosis not present

## 2023-02-06 DIAGNOSIS — N898 Other specified noninflammatory disorders of vagina: Secondary | ICD-10-CM | POA: Diagnosis present

## 2023-02-06 LAB — POCT WET PREP (WET MOUNT)
Clue Cells Wet Prep Whiff POC: NEGATIVE
Trichomonas Wet Prep HPF POC: ABSENT

## 2023-02-06 NOTE — Progress Notes (Signed)
    SUBJECTIVE:   CHIEF COMPLAINT / HPI:   Tanya Nolan is a 47 year-old female here for vaginal itching for 2 days.  She says that she had unprotected sexual intercourse. Does not use any form of contraception or barrier method.  She is not interested in starting any form of contraceptive today.  Denies any vaginal odor. No fever or chills.  Her LMP was 01/19/2023-01/25/2023.  PERTINENT  PMH / PSH: CABG x 3, NSTEMI, T2DM, tobacco use  OBJECTIVE:   BP (!) 140/76   Pulse 66   Wt 242 lb (109.8 kg)   LMP 01/19/2023   SpO2 97%   BMI 37.90 kg/m   General: Well-appearing, pleasant, no distress Respiratory: Normal work of breathing on room air GU:  External vulva and vagina nonerythematous, without any obvious lesions or rash.  White tinted discharge appreciated.  Normal ruggae of vaginal walls.  Cervix is non erythematous and non-friable.   ASSESSMENT/PLAN:   Hypertension associated with diabetes ([place]) Blood pressure elevated today above goal, 140/76 Follow-up with PCP to discuss resuming enalapril, currently only taking Norvasc  Vaginal itching No abnormality seen on pelvic exam Cervical vaginal swabs collected for gonorrhea, chlamydia, trichomonas, bacterial vaginosis, candidiasis Also collected HIV and RPR Patient not interested in discussing contraceptives today; discussed using condoms and barrier methods with intercourse    Orvis Brill, [place]

## 2023-02-06 NOTE — Patient Instructions (Signed)
Great to meet you today, Tanya Nolan.  We completed testing today.  I will call you if anything is abnormal. If everything is normal, I will send you a MyChart message.  As always, I recommend using condoms each time you are sexually active to reduce the risk of sexually transmitted infection and pregnancy.  If you have any questions or concerns, please feel free to give me a call.  Dr. Owens Shark

## 2023-02-06 NOTE — Assessment & Plan Note (Addendum)
No abnormality seen on pelvic exam Cervical vaginal swabs collected for gonorrhea, chlamydia, trichomonas, bacterial vaginosis, candidiasis Also collected HIV and RPR Patient not interested in discussing contraceptives today; discussed using condoms and barrier methods with intercourse

## 2023-02-06 NOTE — Assessment & Plan Note (Signed)
Blood pressure elevated today above goal, 140/76 Follow-up with PCP to discuss resuming enalapril, currently only taking Norvasc

## 2023-02-07 LAB — CERVICOVAGINAL ANCILLARY ONLY
Chlamydia: NEGATIVE
Comment: NEGATIVE
Comment: NORMAL
Neisseria Gonorrhea: NEGATIVE

## 2023-02-07 LAB — RPR: RPR Ser Ql: NONREACTIVE

## 2023-02-07 LAB — HIV ANTIBODY (ROUTINE TESTING W REFLEX): HIV Screen 4th Generation wRfx: NONREACTIVE

## 2023-02-08 ENCOUNTER — Encounter: Payer: Self-pay | Admitting: Pharmacist

## 2023-02-08 ENCOUNTER — Other Ambulatory Visit: Payer: Self-pay | Admitting: Student

## 2023-02-08 ENCOUNTER — Encounter (INDEPENDENT_AMBULATORY_CARE_PROVIDER_SITE_OTHER): Payer: Medicaid Other | Admitting: Pharmacist

## 2023-02-08 VITALS — BP 132/63 | HR 61 | Wt 243.2 lb

## 2023-02-08 DIAGNOSIS — E1169 Type 2 diabetes mellitus with other specified complication: Secondary | ICD-10-CM

## 2023-02-08 DIAGNOSIS — E785 Hyperlipidemia, unspecified: Secondary | ICD-10-CM

## 2023-02-08 LAB — POCT GLYCOSYLATED HEMOGLOBIN (HGB A1C): HbA1c, POC (controlled diabetic range): 9.5 % — AB (ref 0.0–7.0)

## 2023-02-08 MED ORDER — FLUCONAZOLE 150 MG PO TABS: 150.0000 mg | ORAL_TABLET | Freq: Every day | ORAL | 0 refills | Status: DC

## 2023-02-08 NOTE — Patient Instructions (Addendum)
It was nice to see you today! No medication changes today.  Sensor Application If using the App, you can tap Help in the Main Menu to access an in-app tutorial on applying a Sensor. See below for instructions on how to download the app. Apply Sensors only on the back of your upper arm. If placed in other areas, the Sensor may not function properly and could give you inaccurate readings. Avoid areas with scars, moles, stretch marks, or lumps.   Select an area of skin that generally stays flat during your normal daily activities (no bending or folding). Choose a site that is at least 1 inch (2.5 cm) away from any injection sites. To prevent discomfort or skin irritation, you should select a different site other than the one most recently used. Wash application site using a plain soap, dry, and then clean with an alcohol wipe. This will help remove any oily residue that may prevent the sensor from sticking properly. Allow site to air dry before proceeding. Note: The area MUST be clean and dry, or the Sensor may not stay on for the full wear duration specified by your Sensor insert. 4. Unscrew the cap from the Sensor Applicator and set the cap aside.  5. Place the Sensor Applicator over the prepared site and push down firmly to apply the Sensor to your body. 6. Gently pull the Sensor Applicator away from your body. The Sensor should now be attached to your skin. 7. Make sure the Sensor is secure after application. Put the cap back on the Sensor Applicator. Discard the used Engineer, civil (consulting) according to local regulations.  What If My Sensor Falls Off or What If My Sensor Isn't Working? Call [place] Team at [phone] Available 7 days a week from 8AM-8PM EST, excluding holidays If yo have multiple sensors fall off prior to 14 days of use, contact [place] at [phone]   The App Download the [place] 3 App in your phone's app store   Load the app and  select get started now Create an account  Tap scan new sensor Follow the prompts on the screen. If your sensor does not sync, try moving your phone slowly around the sensor. Phone cases may affect scanning. This will be the only time you have to scan the sensor until you apply a new sensor in 14 days.  There will be a 60 minute start up period until the app will display your glucose reading   How To Share Your Readings With [place] Once in the app, go to settings -> connected apps -> LibreView -> Enter Practice ID -> [id number]

## 2023-02-08 NOTE — Assessment & Plan Note (Signed)
LIBERATE Study:  -Patient provided verbal consent to participate in the study. Consent documented in electronic medical record.  -Provided education on Libre 3 CGM. Collaborated to ensure [name] 3 app was downloaded on patient's phone. Educated on how to place sensor every 14 days, patient placed first sensor correctly and verbalized understanding of use, removal, and how to place next sensor. Discussed alarms. 8 sensors provided for a 3 month supply. Educated to contact the office if the sensor falls off early and replacements are needed before their next [employer].  Diabetes longstanding, currently uncontrolled based on A1c. Patient is able to verbalize appropriate hypoglycemia management plan. Medication adherence appears appropriate. Control is suboptimal due to needing therapy intensification. Will defer medication adjustments until follow up.  -Continued SGLT2-I Jardiance (empagliflozin) 10 mg daily. Counseled on sick day rules. -Patient educated on purpose, proper use, and potential adverse effects of Jardiance.  -Extensively discussed pathophysiology of diabetes, recommended lifestyle interventions, dietary effects on blood sugar control.  -Counseled on s/sx of and management of hypoglycemia.  -Discussed possibility of new medication - GLP at next visit.

## 2023-02-08 NOTE — Research (Signed)
S:    Chief Complaint  Patient presents with   Research    LIBERATE   47 y.o. female who presents for diabetes evaluation, education, and management in the context of the LIBERATE Study.   PMH is significant for T2DM, HTN, tobacco abuse, NSTEMI, and hypothyroidism. Patient was last seen by Primary Care Provider, Dr. Gwendlyn Deutscher, on 08/22/2022.   Today, patient arrives in good spirits and presents without any assistance. 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   Patient reports Diabetes was diagnosed in >10 years ago, off insulin for ~2 years.   Family/Social History:  ASCVD: MI - 2023 CKD: none Tobacco: 0.5 PPD ([place]) >> previously on Wellbutrin, Chantix, patches, gum  Longest tobacco free: 5 years, started smoking again when she lost her husband.   Current diabetes medications include: Jardiance 10 mg daily Current hypertension medications include: amlodipine 5 mg daily, metoprolol succinate 12.5 mg BID Current hyperlipidemia medications include: atorvastatin 80 mg daily, Repatha (evolocumab) 140 mg   Patient reports adherence to taking all medications as prescribed.   Insurance coverage: Medicaid  Patient denies hypoglycemic events. Patient has not been checking blood sugar recently.   Patient reports nocturia (nighttime urination). 2-3x/night  Patient reports neuropathy (nerve pain). Patient reports visual changes. Patient reports self foot exams.    O:  Review of Systems  All other systems reviewed and are negative.  Physical Exam Pulmonary:     Effort: Pulmonary effort is normal.  Neurological:     Mental Status: She is alert.  Psychiatric:        Mood and Affect: Mood normal.        Behavior: Behavior normal.        Thought Content: Thought content normal.     Lab Results  Component Value Date   HGBA1C 9.5 (A) 02/08/2023   Vitals:   02/08/23 1610  BP: 132/63  Pulse: 61  SpO2: 100%     Lipid Panel      Component Value Date/Time   CHOL 93 (L) 08/14/2022 1111   TRIG 94 08/14/2022 1111   HDL 40 08/14/2022 1111   CHOLHDL 2.3 08/14/2022 1111   CHOLHDL 4.7 03/14/2022 0552   VLDL 20 03/14/2022 0552   LDLCALC 35 08/14/2022 1111   LDLDIRECT 65 01/05/2021 1457    Clinical Atherosclerotic Cardiovascular Disease (ASCVD): Yes  The ASCVD Risk score (Arnett DK, et al., 2019) failed to calculate for the following reasons:   The patient has a prior MI or stroke diagnosis     A/P: LIBERATE Study:  -Patient provided verbal consent to participate in the study. Consent documented in electronic medical record.  -Provided education on Libre 3 CGM. Collaborated to ensure [name] 3 app was downloaded on patient's phone. Educated on how to place sensor every 14 days, patient placed first sensor correctly and verbalized understanding of use, removal, and how to place next sensor. Discussed alarms. 8 sensors provided for a 3 month supply. Educated to contact the office if the sensor falls off early and replacements are needed before their next [employer].  Diabetes longstanding, currently uncontrolled based on A1c. Patient is able to verbalize appropriate hypoglycemia management plan. Medication adherence appears appropriate. Control is suboptimal due to needing therapy intensification. Will defer medication adjustments until follow up.  -Continued SGLT2-I Jardiance (empagliflozin) 10 mg daily. Counseled on sick day rules. -Patient educated on purpose, proper use, and potential adverse effects of Jardiance.  -Extensively discussed pathophysiology  of diabetes, recommended lifestyle interventions, dietary effects on blood sugar control.  -Counseled on s/sx of and management of hypoglycemia.  -Discussed possibility of new medication - GLP at next visit.  -Next A1c anticipated June 2024.   Written patient instructions provided. Patient verbalized understanding of treatment plan.  Total time in face to face  counseling 40 minutes.    Follow-up:  Pharmacist 2 weeks. Patient seen with Louanne Belton PharmD PGY-1 Pharmacy Resident and Joseph Art, PharmD, PGY2 Pharmacy Resident.

## 2023-02-13 NOTE — Progress Notes (Signed)
Reviewed and agree with Dr Koval's plan.   

## 2023-02-15 ENCOUNTER — Telehealth: Payer: Self-pay

## 2023-02-15 NOTE — Telephone Encounter (Signed)
Attempted to contact patient regarding LIBERATE 7 day follow up. Patient did not answer and a HIPAA compliant VM was left providing Dr. Graylin Shiver office number and requesting a return call.   Joseph Art, Pharm.D. PGY-2 Ambulatory Care Pharmacy Resident

## 2023-02-15 NOTE — Telephone Encounter (Signed)
Patient active on [facility]. Control seems to be improved.     Joseph Art, Pharm.D. PGY-2 Ambulatory Care Pharmacy Resident

## 2023-02-22 ENCOUNTER — Encounter: Payer: Self-pay | Admitting: Pharmacist

## 2023-02-22 ENCOUNTER — Ambulatory Visit (INDEPENDENT_AMBULATORY_CARE_PROVIDER_SITE_OTHER): Payer: Medicaid Other | Admitting: Pharmacist

## 2023-02-22 VITALS — BP 169/77 | HR 68 | Wt 245.0 lb

## 2023-02-22 DIAGNOSIS — E1169 Type 2 diabetes mellitus with other specified complication: Secondary | ICD-10-CM

## 2023-02-22 DIAGNOSIS — E785 Hyperlipidemia, unspecified: Secondary | ICD-10-CM

## 2023-02-22 MED ORDER — LEVOTHYROXINE SODIUM 125 MCG PO TABS: 125.0000 ug | ORAL_TABLET | Freq: Every day | ORAL | 3 refills | Status: DC

## 2023-02-22 MED ORDER — SEMAGLUTIDE(0.25 OR 0.5MG/DOS) 2 MG/3ML ~~LOC~~ SOPN: 0.2500 mg | PEN_INJECTOR | SUBCUTANEOUS | 2 refills | Status: DC

## 2023-02-22 MED ORDER — EMPAGLIFLOZIN 10 MG PO TABS: 10.0000 mg | ORAL_TABLET | Freq: Every day | ORAL | 3 refills | Status: DC

## 2023-02-22 MED ORDER — AMLODIPINE BESYLATE 5 MG PO TABS: 5.0000 mg | ORAL_TABLET | Freq: Every day | ORAL | 3 refills | Status: DC

## 2023-02-22 MED ORDER — EZETIMIBE 10 MG PO TABS: 10.0000 mg | ORAL_TABLET | Freq: Every day | ORAL | 3 refills | Status: DC

## 2023-02-22 NOTE — Patient Instructions (Addendum)
It was nice to see you today!  Your goal blood sugar is 80-130 before eating and less than 180 after eating.  Medication Changes:  Begin Ozempic 0.25 mg injection once weekly    Monitor blood sugars at home and keep a log (glucometer or piece of paper) to bring with you to your next visit.  Aim for a diet full of vegetables, fruit and lean meats (chicken, [place], fish). Try to limit salt intake by eating fresh or frozen vegetables (instead of canned), rinse canned vegetables prior to cooking and do not add any additional salt to meals.

## 2023-02-22 NOTE — Progress Notes (Signed)
S:     Chief Complaint  Patient presents with   Medication Management    DM2 and HTN   47 y.o. female who presents for diabetes evaluation, education, and management.  PMH is significant for HTN, DM2, tobacco use, NSTEMI, and hypothyroidism.  Patient was referred and last seen by Primary Care Provider, Dr. Lum Babe, on 08/22/22. Last Rx visit 02/08/23.   At last visit, patient started on Libre 3 to monitor blood sugars. No changes to her medications were made at the time. Reports sensors for Libre 3 have fallen off after bumping into the wall. Has not reoccurred since switching arms. Currently taking leave from nursing school. Plans to go back in July. Now working as a Lawyer as Designer, multimedia.    Today, patient arrives in good spirits and presents without any assistance.  Patient is a current smoker. Buys two packs daily. Verbalizes smoking at least 10 cigarettes daily. Smoking associated with stress.   Diabetes was diagnosed > 10 years ago. Has been off insulin for about 2 years.   Family/Social History:  ASCVD: MI - 2023 CKD: none Tobacco: 0.5 PPD ([place]) >> previously on Wellbutrin, Chantix, patches, gum  Longest tobacco free: 5 years, started smoking again when she lost her husband.   Current diabetes medications include: empagliflozin (Jardiance) 10 mg, Current hypertension medications include: amlodipine 5 mg, metoprolol tartrate 12.5 mg,  Current hyperlipidemia medications include: atorvastatin 80 mg, Repatha (evolocumab), ezetimibe 10 mg    Patient has not taken several medications due to not having any refills. Has not taken amlodipine for the last few weeks. Sometimes has difficulties affording her medications.   Insurance coverage: Medicaid  Patient denies hypoglycemic events.   Patient denies nocturia (nighttime urination).  Patient denies neuropathy (nerve pain). Patient denies visual changes. Patient denies self foot exams.   Patient reported dietary habits:  Eats 3 meals/day Lunch/Dinner: sandwich with lettuce, tomato, onions, peppers with bread.  Drinks: coffee, fruit punch, sodas   Patient-reported exercise habits: no changes in her exercise habits    O:   Review of Systems  All other systems reviewed and are negative.   Physical Exam Vitals reviewed.  Constitutional:      Appearance: Normal appearance.  Neurological:     Mental Status: She is alert.  Psychiatric:        Behavior: Behavior normal.        Thought Content: Thought content normal.    7 day average blood glucose: 209   CGM Download:  % Time CGM is active: 94% Average Glucose: 209 mg/dL Glucose Management Indicator: 8.3%  Glucose Variability: 25.7 (goal <36%) Time in Goal:  - Time in range 70-180: 35% - Time above range: 65% - Time below range: 0% Observed patterns:   Lab Results  Component Value Date   HGBA1C 9.5 (A) 02/08/2023   Vitals:   02/22/23 1626  BP: (!) 173/79  Pulse: 68  SpO2: 97%    Lipid Panel     Component Value Date/Time   CHOL 93 (L) 08/14/2022 1111   TRIG 94 08/14/2022 1111   HDL 40 08/14/2022 1111   CHOLHDL 2.3 08/14/2022 1111   CHOLHDL 4.7 03/14/2022 0552   VLDL 20 03/14/2022 0552   LDLCALC 35 08/14/2022 1111   LDLDIRECT 65 01/05/2021 1457    Clinical Atherosclerotic Cardiovascular Disease (ASCVD): Yes  The ASCVD Risk score (Arnett DK, et al., 2019) failed to calculate for the following reasons:   The patient has a prior MI or  stroke diagnosis   Patient is participating in a Managed Medicaid Plan:  Yes   A/P: Diabetes longstanding currently uncontrolled. Patient is able to verbalize appropriate hypoglycemia management plan. Control is suboptimal due to medication adherence. -Started GLP-1 Ozempic (semaglutide) 0.25 mg once weekly.  -Continued SGLT2-I Jardiance (empagliflozin) 10 mg. Counseled on sick day rules. -Continued use of Libre 3 to monitor blood glucose at home.  -Patient educated on purpose, proper use,  and potential adverse effects. -Extensively discussed pathophysiology of diabetes, recommended lifestyle interventions, dietary effects on blood sugar control.  -Counseled on s/sx of and management of hypoglycemia.   Hypertension longstanding currently uncontrolled. Blood pressure goal of <130/80 mmHg. Medication adherence poor. Patient has been out of her amlodipine. Blood pressure control is suboptimal due to medication adherence. -Continued amlodipine 5 mg daily.  Tobacco Use. Amenable to setting goal to start limiting cigarette smoking.  - Goal to smoke no more than 10 cigarettes per day   Written patient instructions provided. Patient verbalized understanding of treatment plan.  Total time in face to face counseling 25 minutes.    Follow-up:  Pharmacist on 03/22/23.  Patient seen with Earl Gala PGY-1 Pharmacy Resident, Revonda Standard, PharmD Candidate and Valeda Malm, PharmD, PGY2 Pharmacy Resident.

## 2023-02-24 ENCOUNTER — Other Ambulatory Visit: Payer: Self-pay | Admitting: Physician Assistant

## 2023-02-26 NOTE — Progress Notes (Signed)
Reviewed and agree with Dr Koval's plan.   

## 2023-02-27 ENCOUNTER — Telehealth: Payer: Self-pay

## 2023-02-27 ENCOUNTER — Other Ambulatory Visit (HOSPITAL_COMMUNITY): Payer: Self-pay

## 2023-02-27 NOTE — Telephone Encounter (Signed)
A Prior Authorization was initiated for this patients OZEMPIC through [employer].   Confirmation# [id number]

## 2023-03-02 NOTE — Telephone Encounter (Signed)
Prior Auth for patients medication OZEMPIC 0.25/0.5MG  DOSE PENS approved by MEDICAID from 02/27/23 to 05/28/23.

## 2023-03-22 ENCOUNTER — Ambulatory Visit: Payer: Medicaid Other | Admitting: Pharmacist

## 2023-05-08 IMAGING — CT CT HEAD W/O CM
4 series · 17 of 47 positions shown, 19 images · non-contrast
Comparison: None.

CLINICAL DATA: Polytrauma, critical, head/C-spine injury suspected

Loss of consciousness leading to fall striking shower chair.
EXAM:
CT HEAD WITHOUT CONTRAST
TECHNIQUE: Contiguous axial images were obtained from the base of the skull
through the vertex without intravenous contrast.

[Series 2: head wo · axial · 0.43mm/px · z∈[-414,-304]mm · 7 of 30 slices shown, 9 images]
[im 4/30  brain]
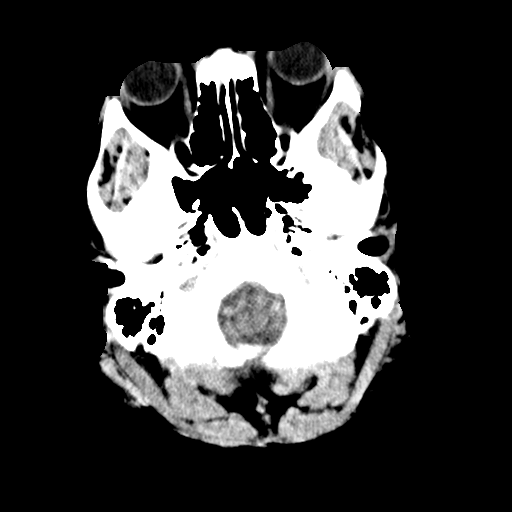
[im 4/30  bone]
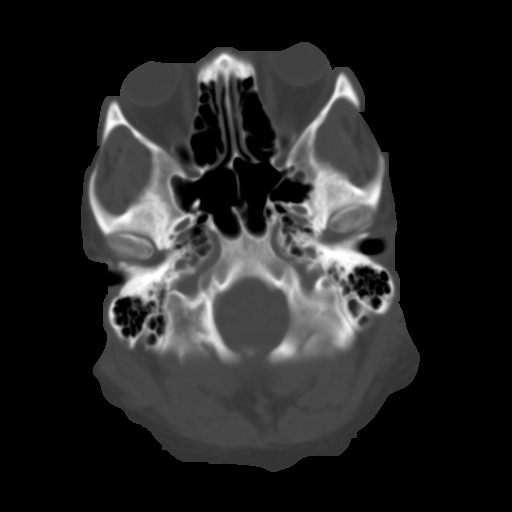
[im 8/30  brain]
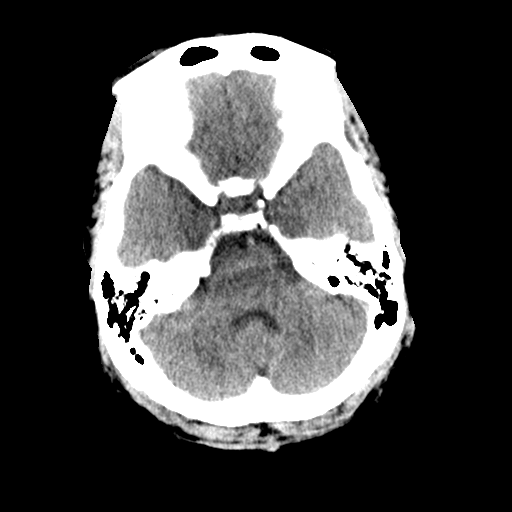
[im 11/30  brain]
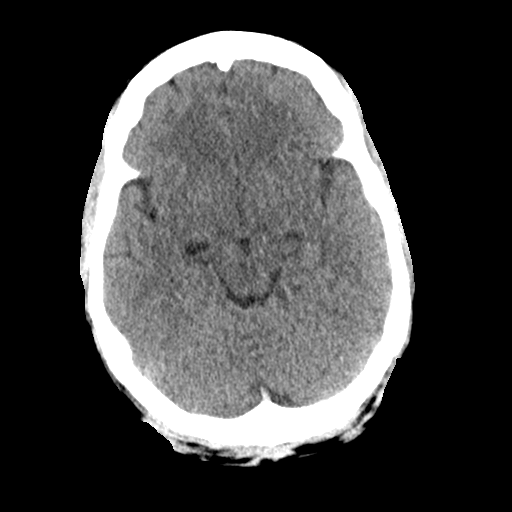
[im 15/30  brain]
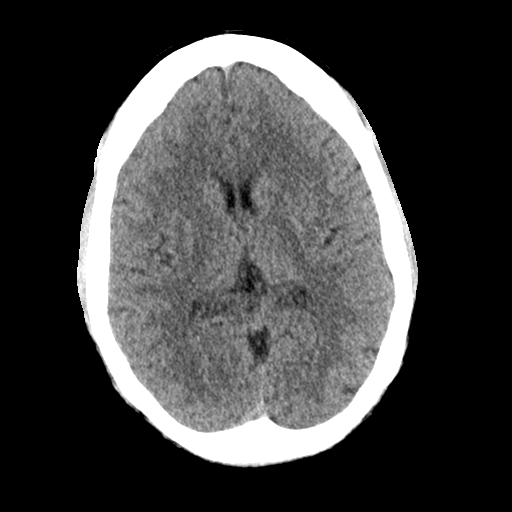
[im 19/30  brain]
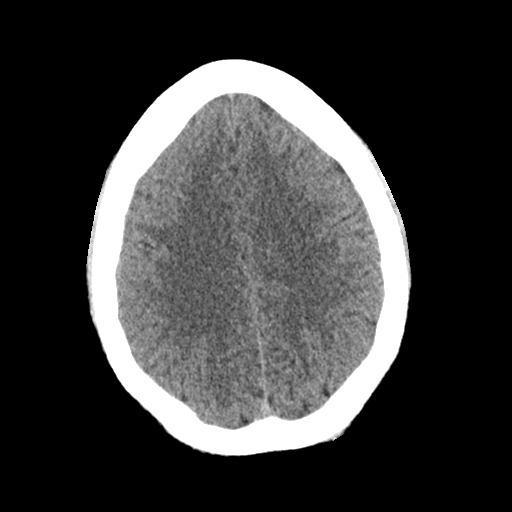
[im 19/30  bone]
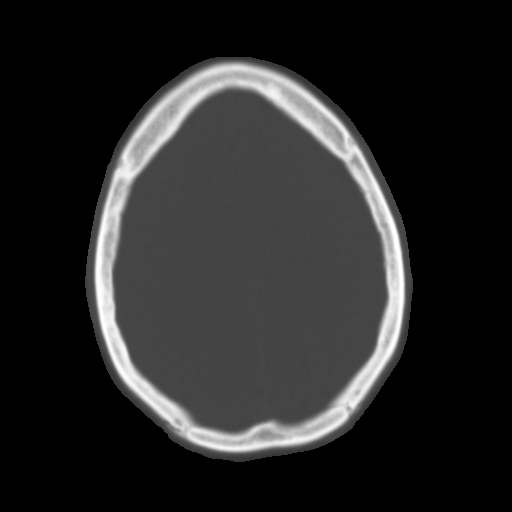
[im 22/30  brain]
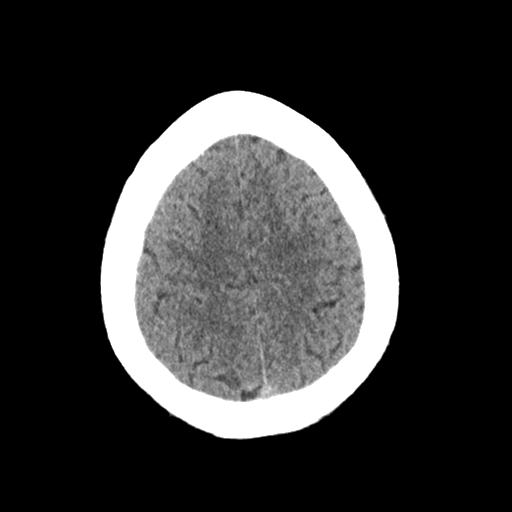
[im 26/30  brain]
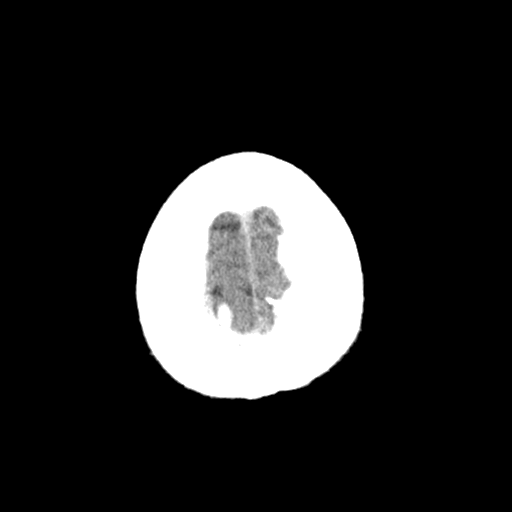

[Series 3: head bone · axial · 0.43mm/px · z∈[-415,-363]mm · 4 of 75 slices shown]
[im 8/75  bone]
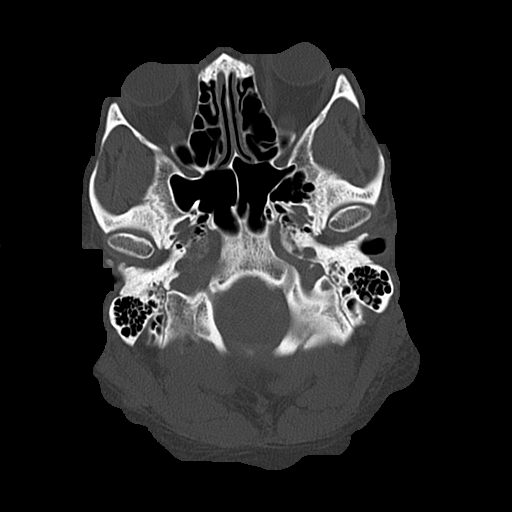
[im 15/75  bone]
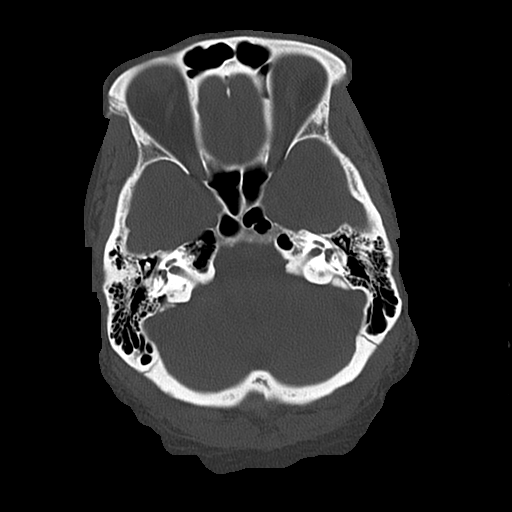
[im 23/75  bone]
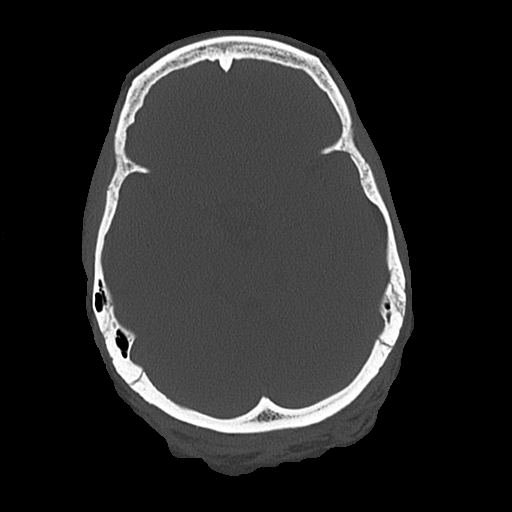
[im 34/75  bone]
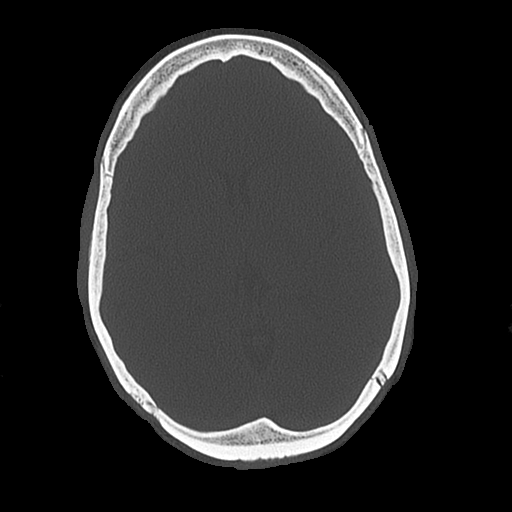

[Series 4: coronal soft · coronal · 0.32mm/px · 3 of 70 slices shown]
[im 24/70  brain]
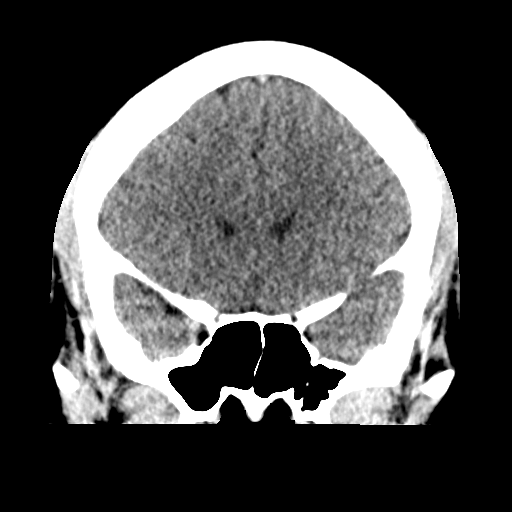
[im 31/70  brain]
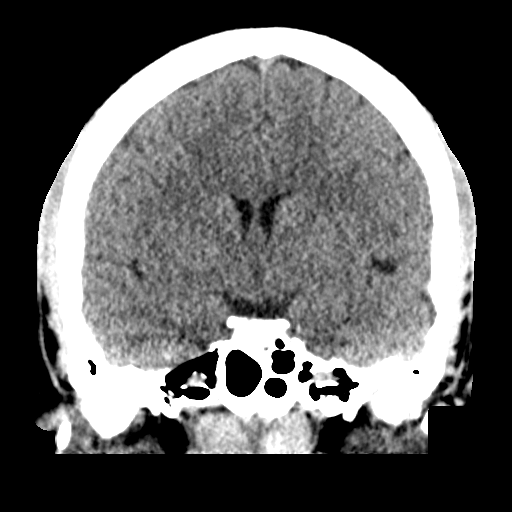
[im 39/70  brain]
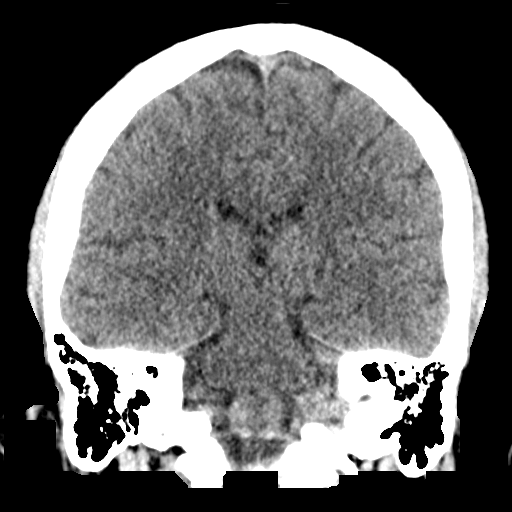

[Series 5: sagittal soft · sagittal · 0.32mm/px · 3 of 56 slices shown]
[im 19/56  brain]
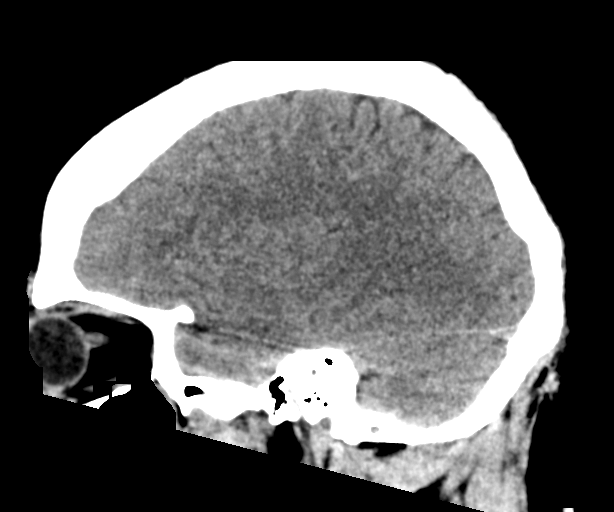
[im 28/56  brain]
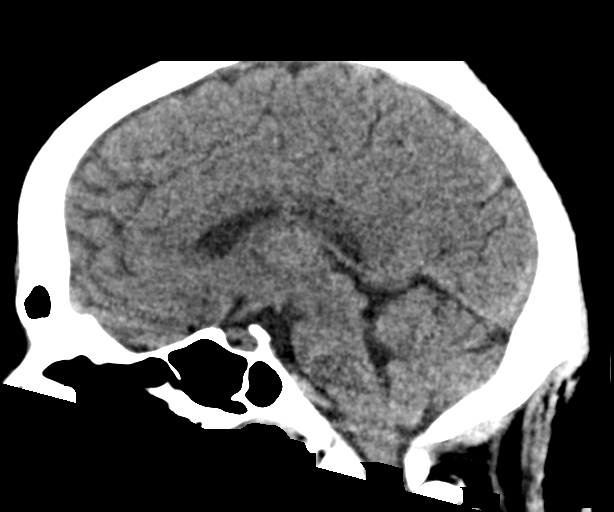
[im 37/56  brain]
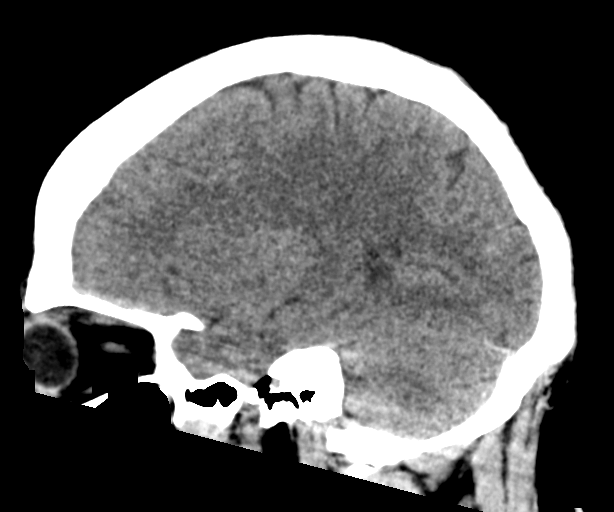

[17 of 47 positions shown; findings below may reference images not displayed]

FINDINGS: Brain: No intracranial hemorrhage, mass effect, or midline shift. No
hydrocephalus. The basilar cisterns are patent. No evidence of
territorial infarct or acute ischemia. No extra-axial or
intracranial fluid collection.

Vascular: No hyperdense vessel or unexpected calcification.

Skull: Normal. Negative for fracture or focal lesion.

Sinuses/Orbits: Paranasal sinuses and mastoid air cells are clear.
The visualized orbits are unremarkable.

Other: None.
IMPRESSION: Negative noncontrast head CT.

## 2023-05-08 IMAGING — CT CT NECK W/ CM
4 of 5 series · 13 of 33 positions shown, 15 images · IV contrast (APPLIED)
Comparison: None.

CLINICAL DATA: Laryngeal edema. Patient with a fall striking her
anterior larynx. Pain with swallowing. Voice slightly hoarse.

EXAM:
CT NECK WITH CONTRAST
TECHNIQUE: Multidetector CT imaging of the neck was performed using the
standard protocol following the bolus administration of intravenous
contrast.
CONTRAST:  75mL OMNIPAQUE IOHEXOL 350 MG/ML SOLN

[Series 3: ax bone · axial · 0.51mm/px · z∈[-561,-465]mm · 3 of 98 slices shown, 4 images]
[im 25/98  soft-tissue]
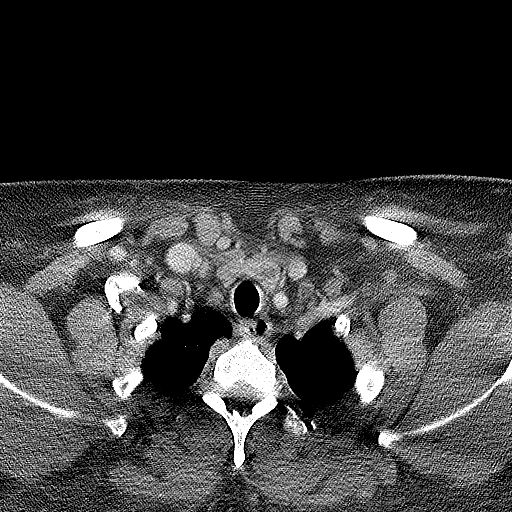
[im 25/98  bone]
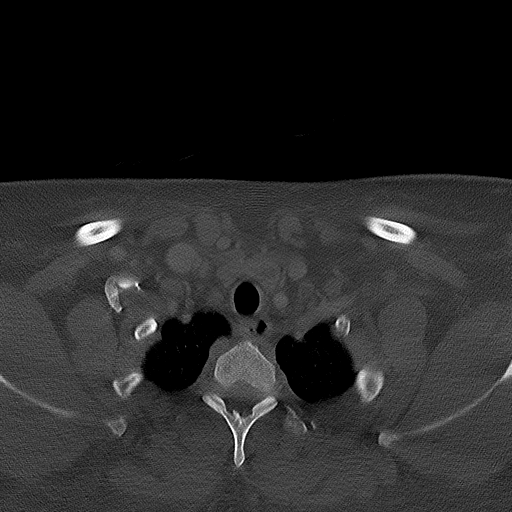
[im 49/98  bone]
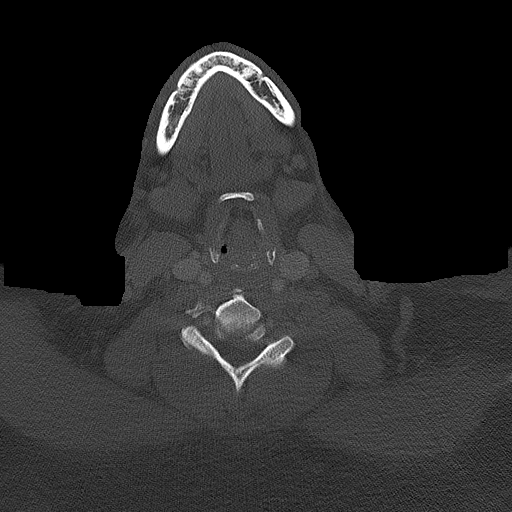
[im 73/98  bone]
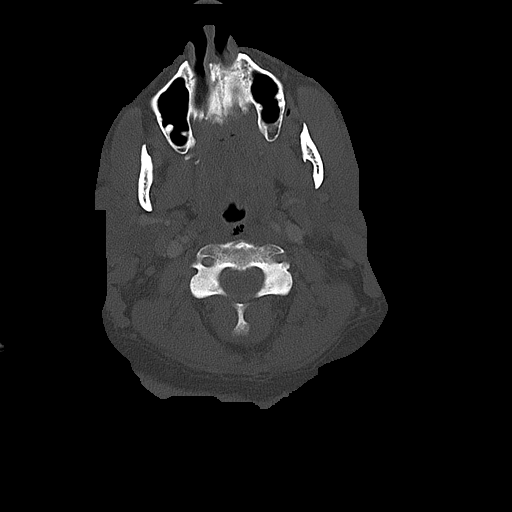

[Series 4: cor neck (person_name) · coronal · 0.39mm/px · 3 of 153 slices shown]
[im 31/153  bone]
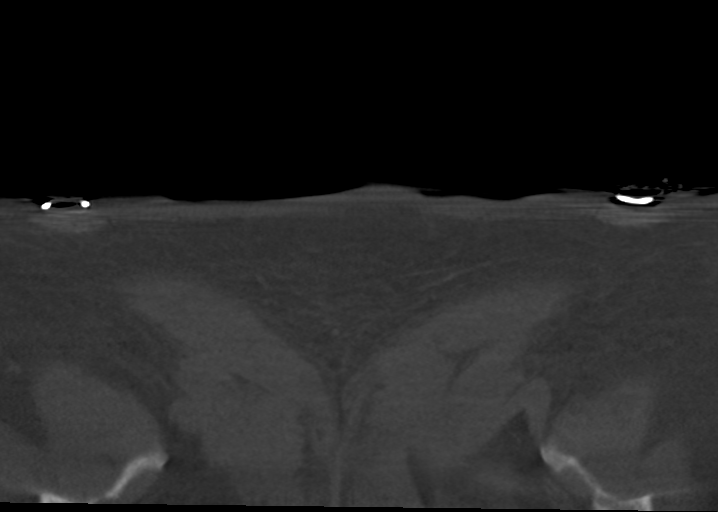
[im 61/153  bone]
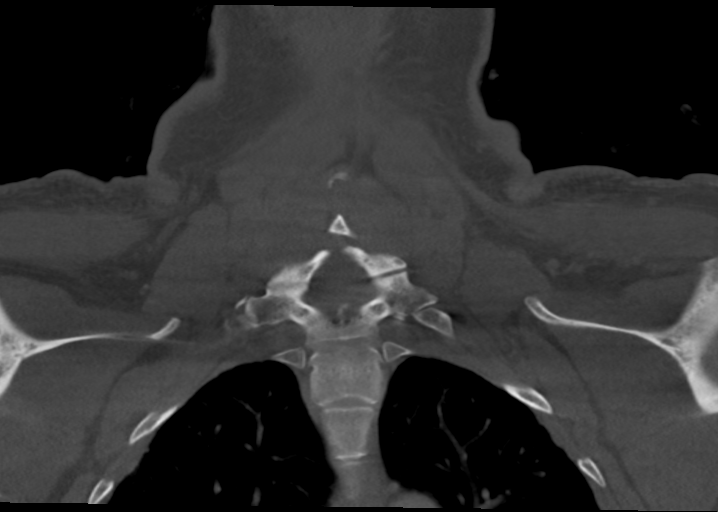
[im 92/153  bone]
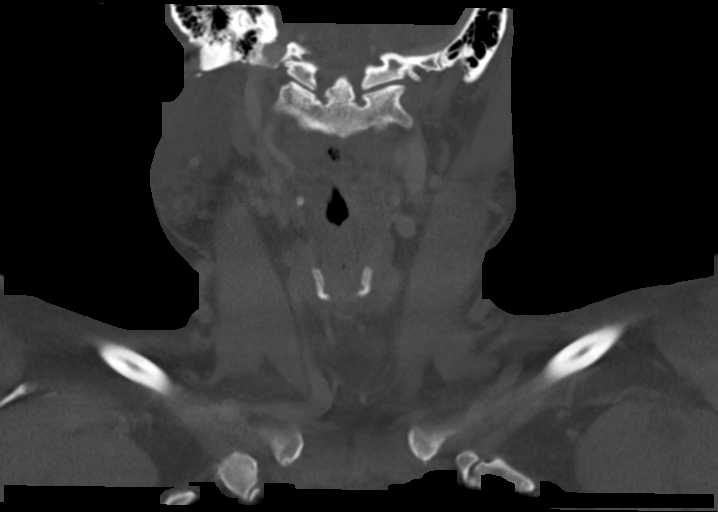

[Series 5: sag neck (person_name) · sagittal · 0.39mm/px · 5 of 247 slices shown, 6 images]
[im 83/247  bone]
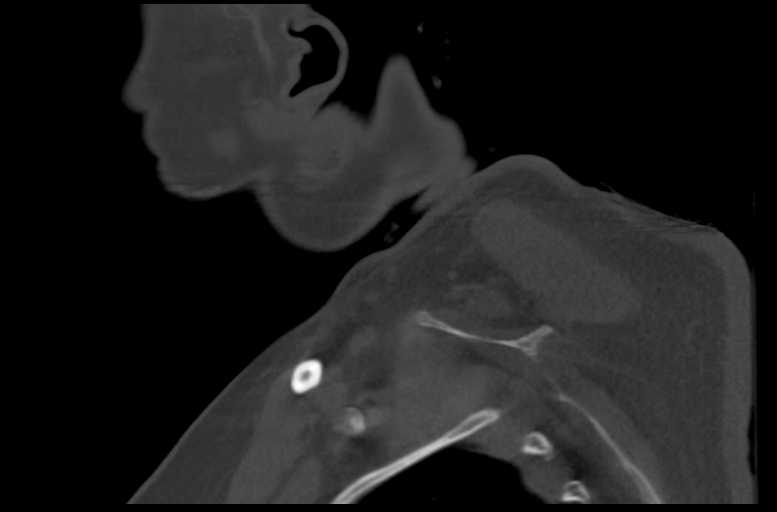
[im 103/247  bone]
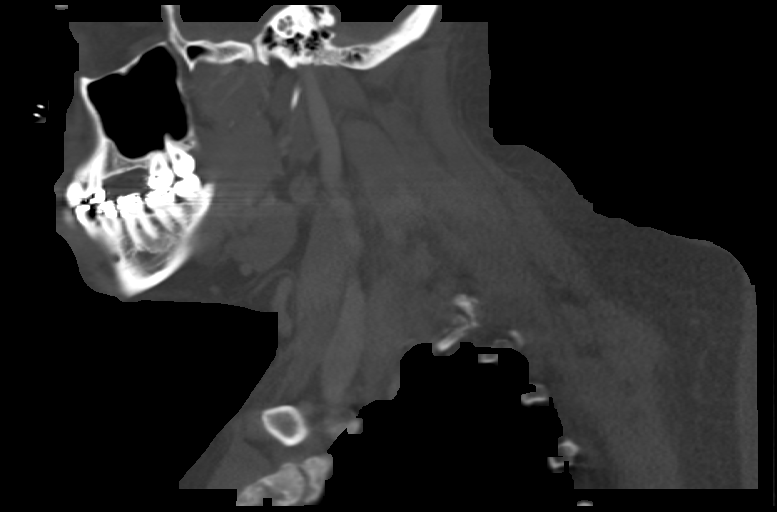
[im 124/247  soft-tissue]
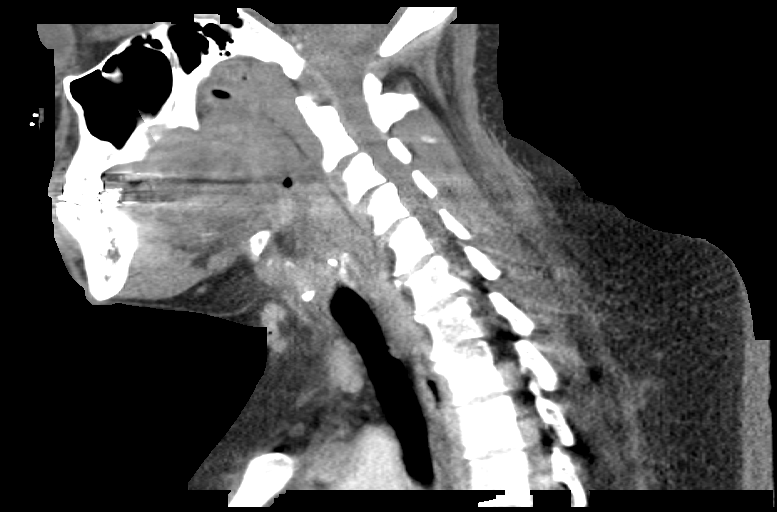
[im 124/247  bone]
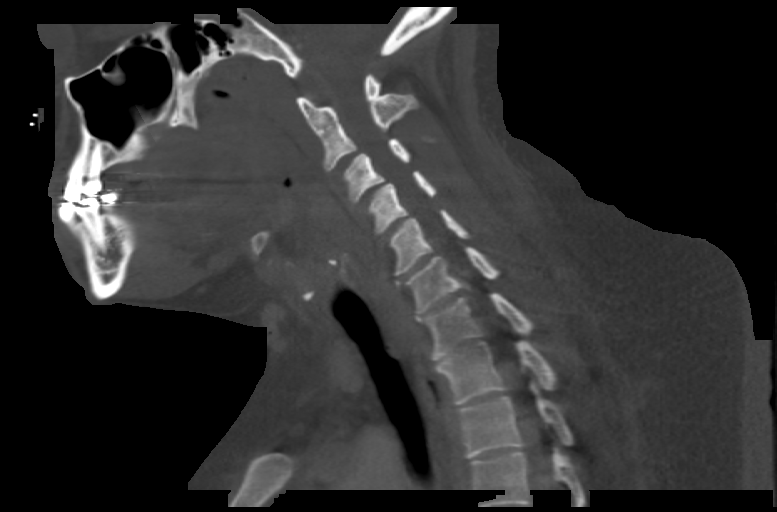
[im 144/247  bone]
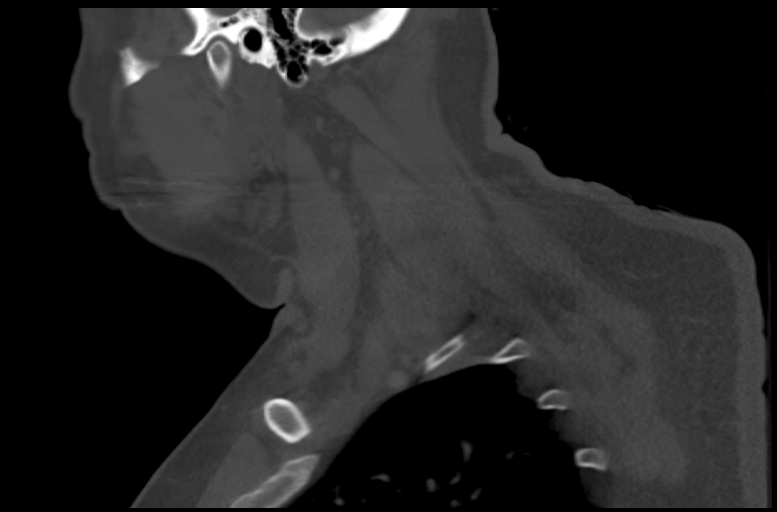
[im 165/247  bone]
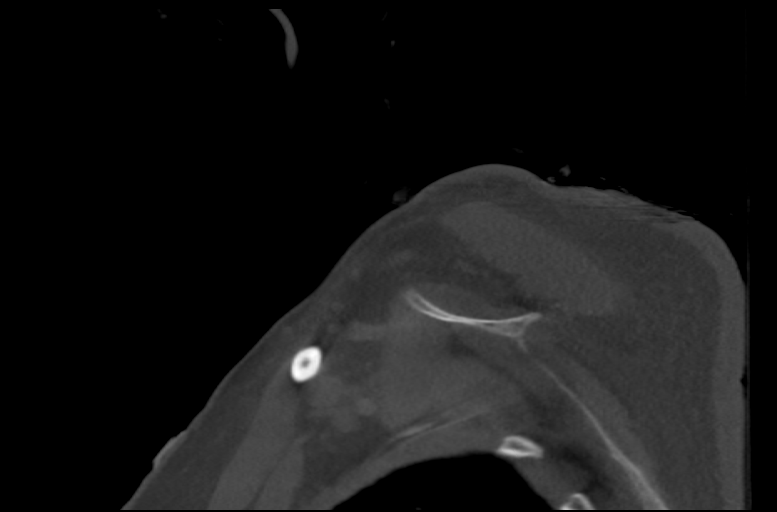

[Series 6: ax oropharynx · axial · 0.37mm/px · z∈[-565,-515]mm · 2 of 100 slices shown]
[im 25/100  bone]
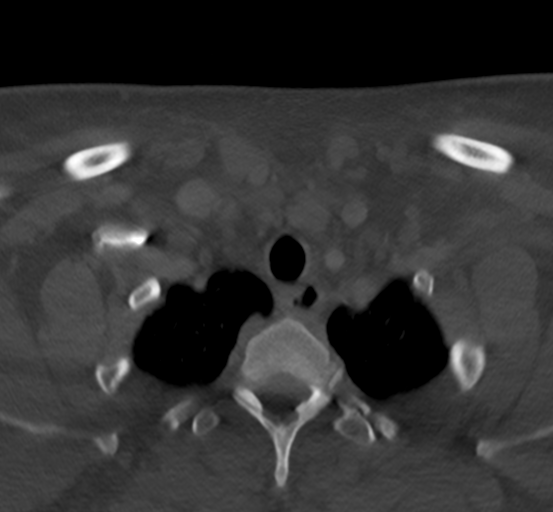
[im 50/100  bone]
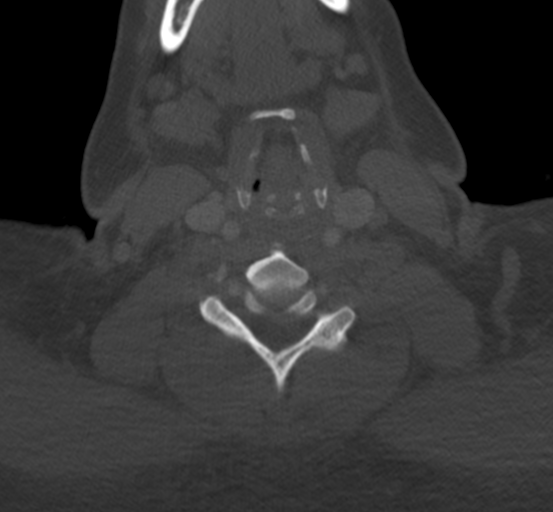

[13 of 33 positions shown; findings below may reference images not displayed]

FINDINGS: Pharynx and larynx: Mild motion and metallic dental streak artifact.
No evidence of significant swelling or a mass. Closed glottis. No
retropharyngeal fluid.

Salivary glands: No inflammation, mass, or stone.

Thyroid: Unremarkable.

Lymph nodes: Slight asymmetric enlargement of a single left level II
a lymph node which measures 10 mm in short axis, upper limits of
normal and likely benign.

Vascular: Major vascular structures of the neck are grossly patent.

Limited intracranial: Unremarkable.

Visualized orbits: Unremarkable.

Mastoids and visualized paranasal sinuses: Clear.

Skeleton: No acute osseous abnormality or suspicious osseous lesion.
Incomplete segmentation at C1-2. Mild cervical spondylosis.

Upper chest: Clear lung apices.

Other: None.
IMPRESSION: No acute abnormality identified in the neck.

## 2023-05-14 ENCOUNTER — Encounter: Payer: Self-pay | Admitting: Pharmacist

## 2023-06-01 ENCOUNTER — Other Ambulatory Visit (HOSPITAL_COMMUNITY)
Admission: RE | Admit: 2023-06-01 | Discharge: 2023-06-01 | Disposition: A | Discharge status: Home / Self Care | Payer: MEDICAID | Source: Ambulatory Visit | Attending: Family Medicine | Admitting: Family Medicine

## 2023-06-01 ENCOUNTER — Encounter: Payer: Self-pay | Admitting: Family Medicine

## 2023-06-01 ENCOUNTER — Ambulatory Visit (INDEPENDENT_AMBULATORY_CARE_PROVIDER_SITE_OTHER): Payer: MEDICAID | Admitting: Family Medicine

## 2023-06-01 VITALS — BP 130/80 | HR 81 | Ht 67.0 in | Wt 237.5 lb

## 2023-06-01 DIAGNOSIS — I152 Hypertension secondary to endocrine disorders: Secondary | ICD-10-CM

## 2023-06-01 DIAGNOSIS — E1169 Type 2 diabetes mellitus with other specified complication: Secondary | ICD-10-CM | POA: Diagnosis not present

## 2023-06-01 DIAGNOSIS — Z114 Encounter for screening for human immunodeficiency virus [HIV]: Secondary | ICD-10-CM | POA: Diagnosis not present

## 2023-06-01 DIAGNOSIS — E785 Hyperlipidemia, unspecified: Secondary | ICD-10-CM

## 2023-06-01 DIAGNOSIS — I252 Old myocardial infarction: Secondary | ICD-10-CM | POA: Diagnosis not present

## 2023-06-01 DIAGNOSIS — E039 Hypothyroidism, unspecified: Secondary | ICD-10-CM

## 2023-06-01 DIAGNOSIS — Z202 Contact with and (suspected) exposure to infections with a predominantly sexual mode of transmission: Secondary | ICD-10-CM

## 2023-06-01 DIAGNOSIS — F339 Major depressive disorder, recurrent, unspecified: Secondary | ICD-10-CM

## 2023-06-01 DIAGNOSIS — E1159 Type 2 diabetes mellitus with other circulatory complications: Secondary | ICD-10-CM

## 2023-06-01 MED ORDER — ATORVASTATIN CALCIUM 80 MG PO TABS: 80.0000 mg | ORAL_TABLET | Freq: Every day | ORAL | 1 refills | Status: DC

## 2023-06-01 MED ORDER — AMLODIPINE BESYLATE 5 MG PO TABS: 5.0000 mg | ORAL_TABLET | Freq: Every day | ORAL | 1 refills | Status: DC

## 2023-06-01 MED ORDER — EMPAGLIFLOZIN 10 MG PO TABS: 10.0000 mg | ORAL_TABLET | Freq: Every day | ORAL | 1 refills | Status: DC

## 2023-06-01 MED ORDER — LEVOTHYROXINE SODIUM 125 MCG PO TABS: 125.0000 ug | ORAL_TABLET | Freq: Every day | ORAL | 1 refills | Status: DC

## 2023-06-01 MED ORDER — METOPROLOL TARTRATE 25 MG PO TABS: 12.5000 mg | ORAL_TABLET | Freq: Two times a day (BID) | ORAL | 1 refills | Status: DC

## 2023-06-01 MED ORDER — EZETIMIBE 10 MG PO TABS: 10.0000 mg | ORAL_TABLET | Freq: Every day | ORAL | 1 refills | Status: DC

## 2023-06-01 MED ORDER — ASPIRIN 81 MG PO TBEC: 81.0000 mg | DELAYED_RELEASE_TABLET | Freq: Every day | ORAL | 1 refills | Status: DC

## 2023-06-01 MED ORDER — CLOPIDOGREL BISULFATE 75 MG PO TABS: 75.0000 mg | ORAL_TABLET | Freq: Every day | ORAL | 1 refills | Status: DC

## 2023-06-01 NOTE — Progress Notes (Signed)
    SUBJECTIVE:   CHIEF COMPLAINT / HPI:   DM2/HTN/HLD/Hypothyroidism: She has been off all her medication for about five days. She could not tell why she stopped taking her meds.  She, however, had not been able to pick up her Lipitor 80 mg from the pharmacy x 2 weeks.  She is here for a follow-up.  STD: She is sexually active. Not on birth control and does not use protections regularly. She is not trying to get pregnant and has not been able to get pregnant for many years despite not being on birth control. She has been with the same partner for 6 months.  MDD: Endorsed feeling well today. She is compliant with her Abilify 5 mg every day and Zoloft 50 mg QD. No concerns.   PERTINENT  PMH / PSH: PMHx reviewed  OBJECTIVE:   BP 130/80   Pulse 81   Ht 5\' 7"  (1.702 m)   Wt 237 lb 8 oz (107.7 kg)   LMP 05/15/2023   SpO2 99%   BMI 37.20 kg/m   Physical Exam Vitals and nursing note reviewed. Exam conducted with a chaperone present Clabe Seal Legette).  Cardiovascular:     Rate and Rhythm: Normal rate and regular rhythm.     Heart sounds: Normal heart sounds. No murmur heard. Pulmonary:     Effort: Pulmonary effort is normal. No respiratory distress.     Breath sounds: Normal breath sounds. No wheezing.  Abdominal:     General: Bowel sounds are normal. There is no distension.     Palpations: Abdomen is soft. There is no mass.     Tenderness: There is no abdominal tenderness.  Neurological:     Mental Status: She is oriented to person, place, and time.  Psychiatric:        Mood and Affect: Mood normal.        Behavior: Behavior normal.      ASSESSMENT/PLAN:   Type 2 diabetes mellitus with hyperlipidemia (HCC) She is compliant with Ozempic 0.25 mg weekly and takes her Jardiance on and off. A1C checked today. I will contact her with the results. Will likely increase her Ozempic to 0.5 weekly. She is ok with change if needed. Jardiance escribed. Lipitor escribed for  HLD. Her Ophthalmologist's office is closed down. New referral placed.  Hypertension associated with diabetes (HCC) On and off with BP  meds compliance Per the patient her last dose was a few days ago. She takes it on and off Plan to refill Norvasc 5 mg every day Monitor BP closely at home F/U in 2-3 months for BP reassessment She agreed with the plan  Hypothyroidism Inconsistent compliance with her meds Compliance discussed Med refills Will recheck lab in the future  Major depressive disorder, recurrent (HCC) Flowsheet Row Office Visit from 02/06/2023 in [facility]  PHQ-9 Total Score 4      Continue Abilify, Zoloft and F/U with Psych     Janit Pagan, MD [facility]

## 2023-06-01 NOTE — Assessment & Plan Note (Signed)
On and off with BP  meds compliance Per the patient her last dose was a few days ago. She takes it on and off Plan to refill Norvasc 5 mg every day Monitor BP closely at home F/U in 2-3 months for BP reassessment She agreed with the plan

## 2023-06-01 NOTE — Assessment & Plan Note (Addendum)
Flowsheet Row Office Visit from 02/06/2023 in [facility]  PHQ-9 Total Score 4      Continue Abilify, Zoloft and F/U with Psych

## 2023-06-01 NOTE — Assessment & Plan Note (Signed)
Inconsistent compliance with her meds Compliance discussed Med refills Will recheck lab in the future

## 2023-06-01 NOTE — Patient Instructions (Signed)
Safe Sex Practicing safe sex means taking steps before and during sex to reduce your risk of: Getting an STI (sexually transmitted infection). Giving your partner an STI. Unwanted or unplanned pregnancy. How to practice safe sex Ways you can practice safe sex  Limit your sexual partners to only one partner who is having sex with only you. Avoid using alcohol and drugs before having sex. Alcohol and drugs can affect your judgment. Before having sex with a new partner: Talk to your partner about past partners, past STIs, and drug use. Get screened for STIs and discuss the results with your partner. Ask your partner to get screened too. Check your body regularly for sores, blisters, rashes, or unusual discharge. If you notice any of these problems, visit your health care provider. Avoid sexual contact if you have symptoms of an infection or you are being treated for an STI. While having sex, use a condom. Make sure to: Use a condom every time you have vaginal, oral, or anal sex. Both females and males should wear condoms during oral sex. Keep condoms in place from the beginning to the end of sexual activity. Use a latex condom, if possible. Latex condoms offer the best protection. Use only water-based lubricants with a condom. Using petroleum-based lubricants or oils will weaken the condom and increase the chance that it will break. Ways your health care provider can help you practice safe sex  See your health care provider for regular screenings, exams, and tests for STIs. Talk with your health care provider about what kind of birth control (contraception) is best for you. Get vaccinated against hepatitis B and human papillomavirus (HPV). If you are at risk of being infected with HIV (human immunodeficiency virus), talk with your health care provider about taking a prescription medicine to prevent HIV infection. You are at risk for HIV if you: Are a man who has sex with other men. Are  sexually active with more than one partner. Take drugs by injection. Have a sex partner who has HIV. Have unprotected sex. Have sex with someone who has sex with both men and women. Have had an STI. Follow these instructions at home: Take over-the-counter and prescription medicines only as told by your health care provider. Keep all follow-up visits. This is important. Where to find more information Centers for Disease Control and Prevention: www.cdc.gov Planned Parenthood: www.plannedparenthood.org Office on Women's Health: www.womenshealth.gov Summary Practicing safe sex means taking steps before and during sex to reduce your risk getting an STI, giving your partner an STI, and having an unwanted or unplanned pregnancy. Before having sex with a new partner, talk to your partner about past partners, past STIs, and drug use. Use a condom every time you have vaginal, oral, or anal sex. Both females and males should wear condoms during oral sex. Check your body regularly for sores, blisters, rashes, or unusual discharge. If you notice any of these problems, visit your health care provider. See your health care provider for regular screenings, exams, and tests for STIs. This information is not intended to replace advice given to you by your health care provider. Make sure you discuss any questions you have with your health care provider. Document Revised: 04/05/2020 Document Reviewed: 04/05/2020 Elsevier Patient Education  2024 Elsevier Inc.  

## 2023-06-01 NOTE — Assessment & Plan Note (Addendum)
She is compliant with Ozempic 0.25 mg weekly and takes her Jardiance on and off. A1C checked today. I will contact her with the results. Will likely increase her Ozempic to 0.5 weekly. She is ok with change if needed. Jardiance escribed. Lipitor escribed for HLD. Her Ophthalmologist's office is closed down. New referral placed.

## 2023-06-02 LAB — RPR: RPR Ser Ql: NONREACTIVE

## 2023-06-02 LAB — MICROALBUMIN / CREATININE URINE RATIO
Creatinine, Urine: 342.8 mg/dL
Microalb/Creat Ratio: 98 mg/g creat — ABNORMAL HIGH (ref 0–29)
Microalbumin, Urine: 337 ug/mL

## 2023-06-02 LAB — HIV ANTIBODY (ROUTINE TESTING W REFLEX): HIV Screen 4th Generation wRfx: NONREACTIVE

## 2023-06-02 LAB — HEMOGLOBIN A1C
Est. average glucose Bld gHb Est-mCnc: 206 mg/dL
Hgb A1c MFr Bld: 8.8 % — ABNORMAL HIGH (ref 4.8–5.6)

## 2023-06-04 ENCOUNTER — Telehealth: Payer: Self-pay | Admitting: Pharmacist

## 2023-06-04 ENCOUNTER — Telehealth: Payer: Self-pay | Admitting: Family Medicine

## 2023-06-04 LAB — CERVICOVAGINAL ANCILLARY ONLY
Chlamydia: NEGATIVE
Comment: NEGATIVE
Comment: NEGATIVE
Comment: NORMAL
Neisseria Gonorrhea: NEGATIVE
Trichomonas: NEGATIVE

## 2023-06-04 MED ORDER — LISINOPRIL 5 MG PO TABS: 5.0000 mg | ORAL_TABLET | Freq: Every day | ORAL | 1 refills | Status: DC

## 2023-06-04 NOTE — Telephone Encounter (Signed)
Attempted to contact patient for follow-up of [facility] 3 month visit - scheduling   Left HIPAA compliant voice mail requesting call back to direct phone: [phone]  Total time with patient call and documentation of interaction: 7 minutes.   Needs to have visit to get more CGM sensors.

## 2023-06-04 NOTE — Telephone Encounter (Signed)
Urine Microalbumin result discussed. Tight glucose and BP control discussed. She has yet to pick up Norvasc from the pharmacy.  I called the pharmacy and spoke with Danford Bad. T to cancel her Norvasc prescription and discussed starting ACEi with the patient.  She agreed with the plan. Lisinopril Escribed

## 2023-06-24 ENCOUNTER — Other Ambulatory Visit: Payer: Self-pay | Admitting: Cardiology

## 2023-06-26 ENCOUNTER — Encounter: Payer: Self-pay | Admitting: Pharmacist

## 2023-06-26 ENCOUNTER — Encounter: Payer: MEDICAID | Admitting: Pharmacist

## 2023-06-26 VITALS — BP 181/78 | HR 67 | Ht 67.5 in | Wt 240.6 lb

## 2023-06-26 DIAGNOSIS — E1159 Type 2 diabetes mellitus with other circulatory complications: Secondary | ICD-10-CM

## 2023-06-26 DIAGNOSIS — E785 Hyperlipidemia, unspecified: Secondary | ICD-10-CM

## 2023-06-26 DIAGNOSIS — I152 Hypertension secondary to endocrine disorders: Secondary | ICD-10-CM

## 2023-06-26 DIAGNOSIS — E1169 Type 2 diabetes mellitus with other specified complication: Secondary | ICD-10-CM

## 2023-06-26 DIAGNOSIS — Z720 Tobacco use: Secondary | ICD-10-CM

## 2023-06-26 LAB — POCT GLYCOSYLATED HEMOGLOBIN (HGB A1C): HbA1c, POC (controlled diabetic range): 8.4 % — AB (ref 0.0–7.0)

## 2023-06-26 MED ORDER — VALSARTAN 40 MG PO TABS: 40.0000 mg | ORAL_TABLET | Freq: Every day | ORAL | 3 refills | Status: DC

## 2023-06-26 MED ORDER — SEMAGLUTIDE(0.25 OR 0.5MG/DOS) 2 MG/3ML ~~LOC~~ SOPN: 0.5000 mg | PEN_INJECTOR | SUBCUTANEOUS | 2 refills | Status: DC

## 2023-06-26 MED ORDER — ATORVASTATIN CALCIUM 80 MG PO TABS: 80.0000 mg | ORAL_TABLET | Freq: Every day | ORAL | 3 refills | Status: DC

## 2023-06-26 NOTE — Patient Instructions (Addendum)
It was nice to see you today!  Your goal blood sugar is 80-130 before eating and less than 180 after eating.  Medication Changes: Start valsartan 40 mg 1 tablet once daily.  Increase Ozempic (semaglutide) to 0.5 mg into the skin once weekly by increasing 5 clicks a week.  Continue all other medication the same.   Keep up the good work with diet and exercise. Aim for a diet full of vegetables, fruit and lean meats (chicken, [place], fish). Try to limit salt intake by eating fresh or frozen vegetables (instead of canned), rinse canned vegetables prior to cooking and do not add any additional salt to meals.

## 2023-06-26 NOTE — Assessment & Plan Note (Signed)
Diabetes longstanding currently uncontrolled. Patient is able to verbalize appropriate hypoglycemia management plan. Control is suboptimal due to medication adherence.  -Increased GLP-1 Ozempic (semaglutide) from 0.25 to 0.5 mg once weekly.  -Continued SGLT2-I Jardiance (empagliflozin) 10 mg. Counseled on sick day rules. -Reviewed Libre 3 application results and reapplied sensor with more elaborate instruction on adhesion - Skin tak and tegaderm used. -Patient educated on purpose, proper use, and potential adverse effects. -Extensively discussed pathophysiology of diabetes, recommended lifestyle interventions, dietary effects on blood sugar control.  -Counseled on s/sx of and management of hypoglycemia.

## 2023-06-26 NOTE — Research (Signed)
S:     Chief Complaint  Patient presents with   Diabetes Management Plan   Medication Management    Liberate Study Patient - DM and HTN   47 y.o. female who presents for diabetes evaluation, education, and management. Patient arrives in good spirits and presents without any assistance.   Patient was referred and last seen by Primary Care Provider, Dr. Lum Babe, on 06/01/23.   PMH is significant for HTN, DM2, tobacco use, NSTEMI, and hypothyroidism.  At last visit, started GLP-1 Ozempic (semaglutide) 0.25 mg once weekly.   Patient reports Diabetes was diagnosed in > 10 years ago. Has been off insulin for about 2 years.   Family/Social History:  ASCVD: MI - 2023 CKD: none Tobacco: 0.5 PPD ([place]) >> previously on Wellbutrin, Chantix, patches, gum  Longest tobacco free: 5 years, started smoking again when she lost her husband.   Current diabetes medications include:  empagliflozin (Jardiance) 10 mg, semaglutide (Ozempic) 0.25 mg once a week Current hypertension medications include: amlodipine 5 mg, metoprolol tartrate 12.5 mg Current hyperlipidemia medications include: atorvastatin 80 mg, Repatha (evolocumab), ezetimibe 10 mg   Patient denies adherence with medications, reports missing most medications 3-4 times per week, on average.  Patient denies hypoglycemic events.  Patient denies nocturia (nighttime urination).  Patient denies neuropathy (nerve pain). Patient denies visual changes. Patient denies self foot exams.   O:   ROS  Physical Exam Constitutional:      Appearance: Normal appearance. She is normal weight.  Neurological:     Mental Status: She is alert.  Psychiatric:        Mood and Affect: Mood normal.        Behavior: Behavior normal.        Thought Content: Thought content normal.        Judgment: Judgment normal.   7 day average blood glucose: 172 mg/dL    CGM Download on 16/10/96:  % Time CGM is active: 56% Average Glucose: 172 mg/dL Glucose  Management Indicator: 7.4%  Glucose Variability: 21.9 (goal <36%) Time in Goal:  - Time in range 70-180: 68% - Time above range: 32% - Time below range: 0% Observed patterns:   Lab Results  Component Value Date   HGBA1C 8.4 (A) 06/26/2023   Vitals:   06/26/23 1511 06/26/23 1606  BP: (!) 177/77 (!) 181/78  Pulse: 67   SpO2: 100%     Lipid Panel     Component Value Date/Time   CHOL 93 (L) 08/14/2022 1111   TRIG 94 08/14/2022 1111   HDL 40 08/14/2022 1111   CHOLHDL 2.3 08/14/2022 1111   CHOLHDL 4.7 03/14/2022 0552   VLDL 20 03/14/2022 0552   LDLCALC 35 08/14/2022 1111   LDLDIRECT 65 01/05/2021 1457    Clinical Atherosclerotic Cardiovascular Disease (ASCVD): Yes  The ASCVD Risk score (Arnett DK, et al., 2019) failed to calculate for the following reasons:   The patient has a prior MI or stroke diagnosis   Patient is participating in a Managed Medicaid Plan:  Yes   A/P: Diabetes longstanding currently uncontrolled. Patient is able to verbalize appropriate hypoglycemia management plan. Control is suboptimal due to medication adherence.  -Increased GLP-1 Ozempic (semaglutide) from 0.25 to 0.5 mg once weekly.  -Continued SGLT2-I Jardiance (empagliflozin) 10 mg. Counseled on sick day rules. -Reviewed Libre 3 application results and reapplied sensor with more elaborate instruction on adhesion - Skin tak and tegaderm used. -Patient educated on purpose, proper use, and potential adverse effects. -Extensively  discussed pathophysiology of diabetes, recommended lifestyle interventions, dietary effects on blood sugar control.  -Counseled on s/sx of and management of hypoglycemia.  Hypertension longstanding currently uncontrolled. Blood pressure goal of <130/80 mmHg. Medication adherence poor. Patient did not pick up lisinopril from pharmacy. Reports fear of ACEi because husband was on it and experienced angioedema. Blood pressure control is suboptimal due to medication adherence. -  Discontinued (never took medicine) lisinopril 5 mg once daily at bedtime. - Continued amlodipine 5 mg daily. - Started valsartan 40 mg once daily.  Tobacco Use: 10 cigarettes per day. Goal is to reach a pack every 3 days.  Hyperlipidemia - secondary prevension - known CAD. Refilled atorvastatin 80mg  - #90 -Continue Repatha (evolocumab) in combination.  Written patient instructions provided. Patient verbalized understanding of treatment plan.  Total time in face to face counseling 32 minutes.    Follow-up:  Pharmacist 2 weeks. PCP clinic visit in following next pharmacist visit Patient seen with Andee Poles, PharmD Candidate.

## 2023-06-26 NOTE — Assessment & Plan Note (Signed)
Tobacco Use: 10 cigarettes per day. Goal is to reach a pack every 3 days.

## 2023-06-26 NOTE — Assessment & Plan Note (Signed)
Hypertension longstanding currently uncontrolled. Blood pressure goal of <130/80 mmHg. Medication adherence poor. Patient did not pick up lisinopril from pharmacy. Reports fear of ACEi because husband was on it and experienced angioedema. Blood pressure control is suboptimal due to medication adherence. - Discontinued (never took medicine) lisinopril 5 mg once daily at bedtime. - Continued amlodipine 5 mg daily. - Started valsartan 40 mg once daily.

## 2023-06-27 NOTE — Progress Notes (Signed)
Reviewed and agree with Dr Koval's plan.   

## 2023-07-17 ENCOUNTER — Ambulatory Visit (INDEPENDENT_AMBULATORY_CARE_PROVIDER_SITE_OTHER): Payer: MEDICAID | Admitting: Pharmacist

## 2023-07-17 ENCOUNTER — Encounter: Payer: Self-pay | Admitting: Pharmacist

## 2023-07-17 VITALS — BP 140/78 | HR 74 | Ht 67.0 in | Wt 237.2 lb

## 2023-07-17 DIAGNOSIS — E785 Hyperlipidemia, unspecified: Secondary | ICD-10-CM

## 2023-07-17 DIAGNOSIS — Z720 Tobacco use: Secondary | ICD-10-CM | POA: Diagnosis not present

## 2023-07-17 DIAGNOSIS — E1159 Type 2 diabetes mellitus with other circulatory complications: Secondary | ICD-10-CM

## 2023-07-17 DIAGNOSIS — E1169 Type 2 diabetes mellitus with other specified complication: Secondary | ICD-10-CM

## 2023-07-17 DIAGNOSIS — I152 Hypertension secondary to endocrine disorders: Secondary | ICD-10-CM

## 2023-07-17 MED ORDER — VALSARTAN 40 MG PO TABS: 80.0000 mg | ORAL_TABLET | Freq: Every day | ORAL | Status: DC

## 2023-07-17 NOTE — Patient Instructions (Signed)
It was nice to see you today!  You are doing great with your glucose readings.   Your goal glucose is 80-130 before eating and less than 180 after eating.  Medication Changes: Continue to increase your Ozempic (semaglutide) by 5 clicks with each dose.   Take TWO Valsartan 40mg  (total of 80mg ) once daily.   Continue all other medication the same.   Monitor blood sugars at home and keep a log (glucometer or piece of paper) to bring with you to your next visit.  Keep up the good work with diet and exercise. Aim for a diet full of vegetables, fruit and lean meats (chicken, [place], fish). Try to limit salt intake by eating fresh or frozen vegetables (instead of canned), rinse canned vegetables prior to cooking and do not add any additional salt to meals.

## 2023-07-17 NOTE — Assessment & Plan Note (Signed)
Diabetes longstanding currently with improved control. Patient is able to verbalize appropriate hypoglycemia management plan.  - Continue with gradual increase GLP-1 Ozempic (semaglutide) from 0.25 plus 15 clicks by 5 clicks once weekly.  -Continued SGLT2-I Jardiance (empagliflozin) 10 mg. Counseled on sick day rules. -Reviewed Libre 3 application results - patient reports success with adhesion.  -Patient educated on purpose, proper use, and potential adverse effects.

## 2023-07-17 NOTE — Assessment & Plan Note (Signed)
Hypertension longstanding currently uncontrolled. Blood pressure goal of <130/80 mmHg.  Increased valsartan from 40 to 80mg  once daily.  Recheck at next visit.  - BMET next visit.

## 2023-07-17 NOTE — Progress Notes (Signed)
S:     Chief Complaint  Patient presents with   Medication Management    Diabetes - tobacco    47 y.o. female who presents for diabetes evaluation, education, and management. Patient arrives in good spirits and presents without any assistance.    Patient was referred and last seen by Primary Care Provider, Dr. Lum Babe, on 06/01/23.    PMH is significant for HTN, DM2, tobacco use, NSTEMI, and hypothyroidism.  At last visit, started GLP-1 Ozempic (semaglutide) was planned for gradual increase by 5 clicks per week from 0.25mg  - once weekly.    Patient reports Diabetes was diagnosed in > 10 years ago. Has been off insulin for multiple years.    Family/Social History:  ASCVD: MI - 2023   Current diabetes medications include:  empagliflozin (Jardiance) 10 mg, semaglutide (Ozempic) 0.25 mg - 15 clicks once a week Current hypertension medications include: amlodipine 5 mg, metoprolol tartrate 12.5 mg, valsartan 40mg  Current hyperlipidemia medications include: atorvastatin 80 mg, Repatha (evolocumab), ezetimibe 10 mg    Patient denies hypoglycemic events.  She is moderately happy with her weight loss of ~ 3 lbs since last visit.   O:   Review of Systems  Constitutional:  Positive for malaise/fatigue.  All other systems reviewed and are negative.   Physical Exam Constitutional:      Appearance: Normal appearance.  Pulmonary:     Effort: Pulmonary effort is normal.  Neurological:     Mental Status: She is alert.  Psychiatric:        Mood and Affect: Mood normal.        Behavior: Behavior normal.        Thought Content: Thought content normal.    Libre3 CGM Download today 07/17/2023  % Time CGM is active: 96% Average Glucose: 167 mg/dL Glucose Management Indicator: 7.3  Glucose Variability: 20.8% (goal <36%) Time in Goal:  - Time in range 70-180: 67% - Time above range: 33%  (only 2% > 250) - Time below range: 0% Observed patterns:   Lab Results  Component Value Date    HGBA1C 8.4 (A) 06/26/2023   Vitals:   07/17/23 1453 07/17/23 1454  BP: (!) 144/83 (!) 140/78  Pulse: 74   SpO2: 100%     Lipid Panel     Component Value Date/Time   CHOL 93 (L) 08/14/2022 1111   TRIG 94 08/14/2022 1111   HDL 40 08/14/2022 1111   CHOLHDL 2.3 08/14/2022 1111   CHOLHDL 4.7 03/14/2022 0552   VLDL 20 03/14/2022 0552   LDLCALC 35 08/14/2022 1111   LDLDIRECT 65 01/05/2021 1457    Clinical Atherosclerotic Cardiovascular Disease (ASCVD): Yes  The ASCVD Risk score (Arnett DK, et al., 2019) failed to calculate for the following reasons:   The patient has a prior MI or stroke diagnosis   Patient is participating in a Managed Medicaid Plan:  Yes   A/P: Diabetes longstanding currently with improved control. Patient is able to verbalize appropriate hypoglycemia management plan.  - Continue with gradual increase GLP-1 Ozempic (semaglutide) from 0.25 plus 15 clicks by 5 clicks once weekly.  -Continued SGLT2-I Jardiance (empagliflozin) 10 mg. Counseled on sick day rules. -Reviewed Libre 3 application results - patient reports success with adhesion.  -Patient educated on purpose, proper use, and potential adverse effects.   Hypertension longstanding currently uncontrolled. Blood pressure goal of <130/80 mmHg.  Increased valsartan from 40 to 80mg  once daily.  Recheck at next visit.  - BMET next visit.  Tobacco Use disorder - reports intake of ~ 10 cigarettes per day.  Willing to work on tapering use plan.   -Agreed on goal to reduce to ~ 7 cigs per day in the next two months.   Written patient instructions provided. Patient verbalized understanding of treatment plan.  Total time in face to face counseling 31 minutes.    Follow-up:  Pharmacist 7 weeks PCP clinic visit in 1 month Patient seen with Alesia Banda, PharmD Candidate.

## 2023-07-17 NOTE — Assessment & Plan Note (Signed)
Tobacco Use disorder - reports intake of ~ 10 cigarettes per day.  Willing to work on tapering use plan.   -Agreed on goal to reduce to ~ 7 cigs per day in the next two months.

## 2023-07-18 NOTE — Progress Notes (Signed)
Reviewed and agree with Dr Koval's plan.   

## 2023-07-23 ENCOUNTER — Ambulatory Visit (INDEPENDENT_AMBULATORY_CARE_PROVIDER_SITE_OTHER): Payer: MEDICAID | Admitting: Student

## 2023-07-23 VITALS — BP 130/86 | HR 72 | Ht 67.0 in | Wt 239.6 lb

## 2023-07-23 DIAGNOSIS — B351 Tinea unguium: Secondary | ICD-10-CM

## 2023-07-23 HISTORY — DX: Tinea unguium: B35.1

## 2023-07-23 MED ORDER — TERBINAFINE HCL 250 MG PO TABS: 250.0000 mg | ORAL_TABLET | Freq: Every day | ORAL | 0 refills | Status: DC

## 2023-07-23 NOTE — Progress Notes (Signed)
    SUBJECTIVE:   CHIEF COMPLAINT / HPI:   Tanya Nolan is here with left big toe swelling and discoloration.  She had a pedicure about a month ago.  1 week thereafter, she has swelling in her left big toe. She noticed little bit of soreness.  Did not have any drainage/purulence.  No fevers or chills. Noticed some thickening of the skin in the toenail around the area as well.  She is diabetic and current smoker.  Current A1c is 8.8%. Does not have any open wounds or ulcerations on her feet otherwise.   PERTINENT  PMH / PSH: T2DM, hypothyroidism, tobacco use  OBJECTIVE:   BP 130/86   Pulse 72   Ht 5\' 7"  (1.702 m)   Wt 239 lb 9.6 oz (108.7 kg)   LMP 07/10/2023   SpO2 100%   BMI 37.53 kg/m   General: NAD, well appearing Cardiac: RRR Neuro: A&O Respiratory: normal WOB on RA. No wheezing or crackles on auscultation, good lung sounds throughout Extremities: Moving all 4 extremities equally. Left foot, big toe: Neurovascularly intact in bilateral feet.  Easily palpable distal pulses.  Left big toe with thickening and hyperpigmentation around the nailbed, and nail slightly separated from nail bed.  Mildly tender to palpation.  No erythema or warmth.  Slight swelling.       ASSESSMENT/PLAN:   Onychomycosis Symptoms most consistent with left ring toe onychomycosis. No concern for cellulitis, osteomyelitis or other acute bacterial infection (no erythema, purulence, exquisite tenderness), especially given chronicity of symptoms for 1 month without significant worsening or development of systemic symptoms. Plan to treat with terbinafine 250 mg daily for 10 weeks.  Reviewed prior CMP, no underlying liver dysfunction. Follow-up with PCP in a few weeks to ensure adherence to medication and improvement in symptoms     Darral Dash, DO [facility]

## 2023-07-23 NOTE — Assessment & Plan Note (Signed)
Symptoms most consistent with left ring toe onychomycosis. No concern for cellulitis, osteomyelitis or other acute bacterial infection (no erythema, purulence, exquisite tenderness), especially given chronicity of symptoms for 1 month without significant worsening or development of systemic symptoms. Plan to treat with terbinafine 250 mg daily for 10 weeks.  Reviewed prior CMP, no underlying liver dysfunction. Follow-up with PCP in a few weeks to ensure adherence to medication and improvement in symptoms

## 2023-07-23 NOTE — Patient Instructions (Addendum)
You have onychomycosis, which is a fungal infection of the nail.  Will treat this with an antifungal called terbinafine daily for 10 weeks.  Please follow back up with your PCP so we can see how you are doing in about a month  Dr. Melissa Noon

## 2023-07-24 ENCOUNTER — Other Ambulatory Visit (HOSPITAL_COMMUNITY): Payer: Self-pay | Admitting: Psychiatry

## 2023-07-24 DIAGNOSIS — F32A Depression, unspecified: Secondary | ICD-10-CM

## 2023-07-25 ENCOUNTER — Encounter: Payer: Self-pay | Admitting: Student

## 2023-08-06 ENCOUNTER — Encounter: Payer: Self-pay | Admitting: Family Medicine

## 2023-08-07 ENCOUNTER — Encounter: Payer: Self-pay | Admitting: Family Medicine

## 2023-08-07 ENCOUNTER — Ambulatory Visit: Payer: MEDICAID | Admitting: Family Medicine

## 2023-08-07 VITALS — BP 154/74 | HR 72 | Ht 67.0 in | Wt 238.6 lb

## 2023-08-07 DIAGNOSIS — E785 Hyperlipidemia, unspecified: Secondary | ICD-10-CM

## 2023-08-07 DIAGNOSIS — E1159 Type 2 diabetes mellitus with other circulatory complications: Secondary | ICD-10-CM

## 2023-08-07 DIAGNOSIS — J40 Bronchitis, not specified as acute or chronic: Secondary | ICD-10-CM | POA: Insufficient documentation

## 2023-08-07 DIAGNOSIS — Z23 Encounter for immunization: Secondary | ICD-10-CM | POA: Diagnosis not present

## 2023-08-07 DIAGNOSIS — I152 Hypertension secondary to endocrine disorders: Secondary | ICD-10-CM

## 2023-08-07 DIAGNOSIS — E1169 Type 2 diabetes mellitus with other specified complication: Secondary | ICD-10-CM

## 2023-08-07 DIAGNOSIS — Z7984 Long term (current) use of oral hypoglycemic drugs: Secondary | ICD-10-CM | POA: Diagnosis not present

## 2023-08-07 HISTORY — DX: Bronchitis, not specified as acute or chronic: J40

## 2023-08-07 MED ORDER — AZITHROMYCIN 250 MG PO TABS: ORAL_TABLET | ORAL | 0 refills | Status: DC

## 2023-08-07 NOTE — Assessment & Plan Note (Addendum)
Continue Jardiance and Ozempic 0.5 mg weekly for another 3 weeks May consider increasing to 1 mg weekly there after She has a follow up appointment with Dr. Raymondo Band at that time Monitor CBG closely - libre CBG is in place

## 2023-08-07 NOTE — Patient Instructions (Addendum)

## 2023-08-07 NOTE — Progress Notes (Signed)
    SUBJECTIVE:   CHIEF COMPLAINT / HPI:   DM2: She is compliant with her Jardiance 10 mg QD and Ozempic, which was increased to 0.5 mg weekly about 1 week ago, and tolerates this well. There are no new concerns.  HTN: She is compliant with her Metoprolol 12.5 mg BID and Valsartan 80 mg QHS.   Cough Chronicity: Cough of 4 weeks duration. The problem has been gradually worsening. The problem occurs constantly. The cough is Productive of sputum (Greenish yellow sputum). Associated symptoms include wheezing. Pertinent negatives include no chest pain, chills, ear pain or sore throat. Associated symptoms comments: Short winded with chest congestion. Nothing aggravates the symptoms. She has tried nothing for the symptoms.     PERTINENT  PMH / PSH: PMhx reviewed  OBJECTIVE:   BP (!) 154/74   Pulse 72   Ht 5\' 7"  (1.702 m)   Wt 238 lb 9.6 oz (108.2 kg)   LMP 08/06/2023   SpO2 100%   BMI 37.37 kg/m   Physical Exam Vitals and nursing note reviewed.  Cardiovascular:     Rate and Rhythm: Normal rate and regular rhythm.     Heart sounds: Normal heart sounds. No murmur heard. Pulmonary:     Effort: Pulmonary effort is normal. No respiratory distress.     Comments: Bibasal rales     ASSESSMENT/PLAN:   Type 2 diabetes mellitus with hyperlipidemia (HCC) Continue Jardiance and Ozempic 0.5 mg weekly for another 3 weeks May consider increasing to 1 mg weekly there after She has a follow up appointment with Dr. Raymondo Band at that time Monitor CBG closely - libre CBG is in place  Hypertension associated with diabetes (HCC) BP above goal She is coughing a lot today Plan to treat her respiratory illness and f/u in 2-4 weeks for BP check while on her current regimen Consider titrating up her Metoprolol in the future  Bronchitis Will treat as bacterial bronchitis since this has been ongoing for about 4 weeks Zithromax escribed Chest xray if no improvement Continue to work on smoking  cessation F/U soon if symptoms persists    NB: Patient's appointment was not for cough and had already been given flu shot before I noticed persistent coughing. She then informed me she had been coughing for weeks. She is otherwise well appearing with no respiratory distress.  Declined Covid shot  Janit Pagan, MD [facility]

## 2023-08-07 NOTE — Assessment & Plan Note (Signed)
Will treat as bacterial bronchitis since this has been ongoing for about 4 weeks Zithromax escribed Chest xray if no improvement Continue to work on smoking cessation F/U soon if symptoms persists

## 2023-08-07 NOTE — Assessment & Plan Note (Signed)
BP above goal She is coughing a lot today Plan to treat her respiratory illness and f/u in 2-4 weeks for BP check while on her current regimen Consider titrating up her Metoprolol in the future

## 2023-09-06 ENCOUNTER — Encounter: Payer: MEDICAID | Admitting: Pharmacist

## 2023-09-11 ENCOUNTER — Encounter: Payer: Self-pay | Admitting: Pharmacist

## 2023-09-11 ENCOUNTER — Encounter (INDEPENDENT_AMBULATORY_CARE_PROVIDER_SITE_OTHER): Payer: MEDICAID | Admitting: Pharmacist

## 2023-09-11 VITALS — BP 155/70 | HR 74 | Wt 238.2 lb

## 2023-09-11 DIAGNOSIS — Z7985 Long-term (current) use of injectable non-insulin antidiabetic drugs: Secondary | ICD-10-CM

## 2023-09-11 DIAGNOSIS — E1159 Type 2 diabetes mellitus with other circulatory complications: Secondary | ICD-10-CM

## 2023-09-11 DIAGNOSIS — E1169 Type 2 diabetes mellitus with other specified complication: Secondary | ICD-10-CM

## 2023-09-11 DIAGNOSIS — F32A Depression, unspecified: Secondary | ICD-10-CM | POA: Diagnosis not present

## 2023-09-11 DIAGNOSIS — I152 Hypertension secondary to endocrine disorders: Secondary | ICD-10-CM

## 2023-09-11 DIAGNOSIS — E785 Hyperlipidemia, unspecified: Secondary | ICD-10-CM

## 2023-09-11 LAB — POCT GLYCOSYLATED HEMOGLOBIN (HGB A1C): HbA1c, POC (controlled diabetic range): 9.4 % — AB (ref 0.0–7.0)

## 2023-09-11 MED ORDER — ARIPIPRAZOLE 5 MG PO TABS: 5.0000 mg | ORAL_TABLET | Freq: Every day | ORAL | 3 refills | Status: DC

## 2023-09-11 MED ORDER — SEMAGLUTIDE (1 MG/DOSE) 4 MG/3ML ~~LOC~~ SOPN
1.0000 mg | PEN_INJECTOR | SUBCUTANEOUS | 1 refills | Status: DC
Start: 2023-09-11 — End: 2024-06-30

## 2023-09-11 MED ORDER — METOPROLOL TARTRATE 25 MG PO TABS: 12.5000 mg | ORAL_TABLET | Freq: Two times a day (BID) | ORAL | 1 refills | Status: DC

## 2023-09-11 MED ORDER — VALSARTAN-HYDROCHLOROTHIAZIDE 80-12.5 MG PO TABS: 1.0000 | ORAL_TABLET | Freq: Every day | ORAL | 11 refills | Status: DC

## 2023-09-11 MED ORDER — CLOPIDOGREL BISULFATE 75 MG PO TABS: 75.0000 mg | ORAL_TABLET | Freq: Every day | ORAL | 1 refills | Status: DC

## 2023-09-11 NOTE — Research (Signed)
S:     Chief Complaint  Patient presents with   Medication Management    LIBERATE - 6 month; Medication Review   47 y.o. female who presents for diabetes evaluation, education, and management in the context of the LIBERATE Study. Patient reports that she has been busy with school (10/10 stress). Reports smoking 1 pack of cigarettes every 2 days, and reports struggling to quit while she is in school, as it helps with stress.   PMH is significant for T2DM, HTN, Tobacco Use Disorder, MI.  Patient was referred and last seen by Primary Care Provider, Dr. Lum Babe, on 08/07/2023.  At last visit, continued empagliflozin (Jardiance), semaglutide (Ozempic), metoprolol, and valsartan.   Today, patient arrives in okay spirits and presents without any assistance.   Patient reports Diabetes was diagnosed in 2008.   Current diabetes medications include: empagliflozin (Jardiance) 10 mg once daily, semaglutide (Ozempic) 0.5 mg once weekly Current hypertension medications include: valsartan 80 mg once daily, metoprolol succinate 12.5 mg once daily Current hyperlipidemia medications include: atorvastatin 80 mg once daily, ezetimibe 10 mg once daily, evolocumab (Repatha) 140 mg once every 14 days.  Patient denies adherence with medications, reports missing all medications for the past 2 weeks. Reports if she runs out of one medication she stops taking all medications. Has no problem physically taking medications, but mentally she struggles with taking any medications if she cannot take all medications. Reports it has been ~1 month since last Repatha (evolocumab) injection and semaglutide (Ozempic) injection.   Do you feel that your medications are working for you? Yes, when she takes them Do you have any problems obtaining medications due to transportation or finances? yes Insurance coverage: [employer]  Patient denies hypoglycemic events.   Patient reported dietary habits: Patient reports  having a "sweet tooth" and struggles the most with jolly ranchers and soft chews   Patient-reported exercise habits: Not too active, very busy with school   O:   Review of Systems  All other systems reviewed and are negative.   Physical Exam Vitals reviewed.  Constitutional:      Appearance: Normal appearance.  Pulmonary:     Effort: Pulmonary effort is normal.  Neurological:     Mental Status: She is alert.  Psychiatric:        Thought Content: Thought content normal.        Judgment: Judgment normal.    Lab Results  Component Value Date   HGBA1C 9.4 (A) 09/11/2023    LIBRE 3 PPG: 255 mg/dL   Vitals:   45/40/98 1191 09/11/23 1353  BP: (!) 147/72 (!) 155/70  Pulse: 74   SpO2: 100%      Lipid Panel     Component Value Date/Time   CHOL 93 (L) 08/14/2022 1111   TRIG 94 08/14/2022 1111   HDL 40 08/14/2022 1111   CHOLHDL 2.3 08/14/2022 1111   CHOLHDL 4.7 03/14/2022 0552   VLDL 20 03/14/2022 0552   LDLCALC 35 08/14/2022 1111   LDLDIRECT 65 01/05/2021 1457    Clinical Atherosclerotic Cardiovascular Disease (ASCVD): Yes  The ASCVD Risk score (Arnett DK, et al., 2019) failed to calculate for the following reasons:   The patient has a prior MI or stroke diagnosis    A/P:  LIBERATE Study: 6 month end of study - Patient has remaining sensors at home, encouraged application of new sensor. Hesitant to restart insulin, consider starting insulin in the future along with [facility] 3 sensor, if Patient is willing.  Diabetes longstanding currently remains uncontrolled related to non-adherence. Patient is able to verbalize appropriate hypoglycemia management plan. Medication adherence appears poor, not taking any medications. Control is suboptimal due to non-adherence to medications. Patient is hesitant to restart insulin.  - Increased semaglutide (Ozempic) from 0.5 mg once weekly to 1 mg once weekly - Encouraged restarting empagliflozin (Jardiance) 10 mg once daily -Patient  educated on purpose, proper use, and potential adverse effects.  -Extensively discussed pathophysiology of diabetes, recommended lifestyle interventions, dietary effects on blood sugar control.  -Counseled on s/sx of and management of hypoglycemia.   ASCVD risk - secondary prevention in patient with diabetes. Non-adherence to Repatha (evolocumab) for ~1 month, and non-adherence to atorvastatin or ezetimibe for 2 weeks.  - Encouraged restarting atorvastatin 80 mg once daily, ezetimibe 10 mg once daily, evolocumab (Repatha) 140 mg once every 14 days  Hypertension longstanding currently uncontrolled. Blood pressure goal of <130/80 mmHg. Medication adherence poor. Blood pressure control is suboptimal due to non-adherence for 2 weeks. - Started valsartan-hydrochlorothiazide 80-12.5 mg once daily - Stopped valsartan 80 mg once daily - Encouraged restarting metoprolol succinate 12.5 mg once daily -Patient educated on purpose, proper use, and potential adverse effects.   Tobacco Use Disorder longterm use of ~10 cigarettes/day. Due to school stressors Patient is not willing to stop smoking, but willing to consider intake reduction based on school stressors. Finishes school in 8 months.   Discussed restarting all medications with goal of increasing adherence to taking all medications 5 out of 7 days/week, and confident that she can do this for the next 2 weeks.  Patient requested and sent refills for aripiprazole 5 mg, metoprolol succinate 12.5 mg, clopidogrel 75 mg to preferred pharmacy  Written patient instructions provided. Patient verbalized understanding of treatment plan.  Total time in face to face counseling 29 minutes.    Follow-up:  Pharmacist 09/25/2023. PCP clinic visit in PRN.  Patient seen with Caprice Beaver, PharmD Candidate and Shona Simpson, PharmD Candidate.`.

## 2023-09-11 NOTE — Patient Instructions (Addendum)
It was nice to see you today!  Your goal blood sugar is 80-130 before eating and less than 180 after eating.  Medication Changes: START valsartan-hydrochlorothiazide 80-12.5 mg once daily STOP valsartan 80 mg once daily  Increase Ozempic (semaglutide) from 0.5 mg once weekly to 1 mg once weekly  Start taking medications again - try setting an alarm on your phone to take your medications when you get home  Short term goal of taking medications 5 out of 7 days a week, and not missing two days in a row  Keep up the good work with diet and exercise. Aim for a diet full of vegetables, fruit and lean meats (chicken, [place], fish). Try to limit salt intake by eating fresh or frozen vegetables (instead of canned), rinse canned vegetables prior to cooking and do not add any additional salt to meals.

## 2023-09-11 NOTE — Assessment & Plan Note (Signed)
LIBERATE Study: 6 month end of study - Patient has remaining sensors at home, encouraged application of new sensor. Hesitant to restart insulin, consider starting insulin in the future along with [facility] 3 sensor, if Patient is willing.  Diabetes longstanding currently remains uncontrolled related to non-adherence. Patient is able to verbalize appropriate hypoglycemia management plan. Medication adherence appears poor, not taking any medications. Control is suboptimal due to non-adherence to medications. Patient is hesitant to restart insulin.  - Increased semaglutide (Ozempic) from 0.5 mg once weekly to 1 mg once weekly - Encouraged restarting empagliflozin (Jardiance) 10 mg once daily -Patient educated on purpose, proper use, and potential adverse effects.  -Extensively discussed pathophysiology of diabetes, recommended lifestyle interventions, dietary effects on blood sugar control.  -Counseled on s/sx of and management of hypoglycemia.

## 2023-09-11 NOTE — Assessment & Plan Note (Signed)
Hypertension longstanding currently uncontrolled. Blood pressure goal of <130/80 mmHg. Medication adherence poor. Blood pressure control is suboptimal due to non-adherence for 2 weeks. - Started valsartan-hydrochlorothiazide 80-12.5 mg once daily - Stopped valsartan 80 mg once daily - Encouraged restarting metoprolol succinate 12.5 mg once daily -Patient educated on purpose, proper use, and potential adverse effects.

## 2023-09-12 NOTE — Progress Notes (Signed)
Reviewed and agree.

## 2023-09-18 ENCOUNTER — Telehealth: Payer: Self-pay

## 2023-09-18 NOTE — Telephone Encounter (Signed)
Patient calls nurse line in regard to Ozempic.   She reports the pharmacy needs a prior authorization.   Advised will reach out to our pharmacy team.   She is a diabetic.   Will forward to pharmacy.

## 2023-09-20 NOTE — Telephone Encounter (Signed)
Clinical questions submitted along with office notes.  Key: [id number]  Will check back tomorrow for response.

## 2023-09-20 NOTE — Telephone Encounter (Signed)
PA submitted via covermymeds.  Awaiting clinical loads.   Key: [id number]

## 2023-09-21 NOTE — Telephone Encounter (Signed)
Medication approved through 09/19/2024.  Pharmacy and patient have been updated.

## 2023-09-25 ENCOUNTER — Ambulatory Visit (INDEPENDENT_AMBULATORY_CARE_PROVIDER_SITE_OTHER): Payer: MEDICAID | Admitting: Pharmacist

## 2023-09-25 ENCOUNTER — Encounter: Payer: Self-pay | Admitting: Pharmacist

## 2023-09-25 VITALS — BP 133/74 | HR 77 | Wt 240.0 lb

## 2023-09-25 DIAGNOSIS — E1169 Type 2 diabetes mellitus with other specified complication: Secondary | ICD-10-CM | POA: Diagnosis not present

## 2023-09-25 DIAGNOSIS — F32A Depression, unspecified: Secondary | ICD-10-CM | POA: Diagnosis not present

## 2023-09-25 DIAGNOSIS — E1159 Type 2 diabetes mellitus with other circulatory complications: Secondary | ICD-10-CM | POA: Diagnosis not present

## 2023-09-25 DIAGNOSIS — I152 Hypertension secondary to endocrine disorders: Secondary | ICD-10-CM

## 2023-09-25 DIAGNOSIS — E785 Hyperlipidemia, unspecified: Secondary | ICD-10-CM | POA: Diagnosis not present

## 2023-09-25 LAB — POCT GLYCOSYLATED HEMOGLOBIN (HGB A1C): HbA1c, POC (controlled diabetic range): 9.4 % — AB (ref 0.0–7.0)

## 2023-09-25 MED ORDER — EZETIMIBE 10 MG PO TABS: 10.0000 mg | ORAL_TABLET | Freq: Every day | ORAL | 11 refills | Status: DC

## 2023-09-25 MED ORDER — EMPAGLIFLOZIN 10 MG PO TABS: 10.0000 mg | ORAL_TABLET | Freq: Every day | ORAL | 11 refills | Status: DC

## 2023-09-25 MED ORDER — SERTRALINE HCL 50 MG PO TABS: 50.0000 mg | ORAL_TABLET | Freq: Every day | ORAL | 3 refills | Status: DC

## 2023-09-25 NOTE — Patient Instructions (Signed)
It was nice to see you today!  Your goal blood sugar is 80-130 before eating and less than 180 after eating.  Medication Changes:  Continue all medications the same.   Continue the good work with medication adherence. Try to see if you can take you medications 6-7 days per week!  Keep up the good work with diet and exercise. Aim for a diet full of vegetables, fruit and lean meats (chicken, [place], fish). Try to limit salt intake by eating fresh or frozen vegetables (instead of canned), rinse canned vegetables prior to cooking and do not add any additional salt to meals.

## 2023-09-25 NOTE — Assessment & Plan Note (Signed)
LIBERATE Study: 6 month end of study  Diabetes longstanding currently uncontrolled. Patient is able to verbalize appropriate hypoglycemia management plan. Medication adherence appears okay (taking 5/7 days per week), but better than before. Control is suboptimal due to medication adherence issues, and not starting her semaglutide (Ozempic) back yet. -Continued empagliflozin (Jardiance) 10 mg once daily, semaglutide (Ozempic) 1 mg once weekly and encouraged adherence -Patient educated on purpose, proper use, and potential adverse effects.  -Extensively discussed pathophysiology of diabetes, recommended lifestyle interventions, dietary effects on blood sugar control.  -Counseled on s/sx of and management of hypoglycemia.  -Discussed upon restart of semaglutide (Ozempic) and if feelings of hypoglycemia, can send BG monitor/strips to pharmacy.

## 2023-09-25 NOTE — Assessment & Plan Note (Signed)
Hypertension longstanding currently nearly controlled. Blood pressure goal of <130/80 mmHg. Medication adherence improved (taking 5/7 days per week).  Blood pressure control is suboptimal due to medication adherence issues. -Continued metoprolol tartrate 25 mg 0.5 tablet BID, valsartan-hydrochlorothiazide 80-12.5 mg once daily and encouraged adherence

## 2023-09-25 NOTE — Progress Notes (Signed)
S:     Chief Complaint  Patient presents with   Medication Management    LIBERATE 6 Month Visit; Adherence Check-In   47 y.o. female who presents for diabetes evaluation, education, and management. Patient arrives in good spirits and presents without any assistance. Reports that when she changes positions she experiences dizziness/lightheadness that takes ~5 mins to go away. Reported a few instances where she nearly collapsed, and had to catch herself/sit down. Still smoking ~10 cigarettes/day, Newports.    Patient was last seen by Primary Care Provider, Dr. Melissa Noon, on 07/23/2023.   PMH is significant for T2DM, HTN, Tobacco Use Disorder, MI.  At pharmacy last visit, patient reported non-adherence to all medications for ~2 weeks. Restarted and increased semaglutide (Ozempic) from 0.5 mg to 1 mg one weekly. Started valsartan-hydrochlorothiazide 80-12.5 mg once daily and stopped valsartan 80 mg once daily. Encouraged patient to be adherent to medication, set goal of adherence 5/7 days per week.  Patient reports Diabetes was diagnosed in 2008.   Current diabetes medications include: empagliflozin (Jardiance) 10 mg once daily, semaglutide (Ozempic) 1 mg once weekly Current hypertension medications include: metoprolol tartrate 25 mg 0.5 tablet BID, valsartan-hydrochlorothiazide 80-12.5 mg once daily Current hyperlipidemia medications include: atorvastatin 80 mg once daily, ezetimibe 10 mg once daily, Repatha (evolocumab) 140 mg every 14 days  Patient denies adherence with medications, reports missing medications 2 days per week, on average. Taking medications about ~5 days/week. However, this is greatly improved from last visit, when patient denied adherence to all medications. She reports that with her metoprolol she usually takes 1 tablet once daily rather than 0.5 tablet BID, due to scheduling. Has not picked up Ozempic (semaglutide) yet due to PA, but it went through on Friday and she is  going to pick it up.   Do you feel that your medications are working for you? yes Have you been experiencing any side effects to the medications prescribed? Yes - Orthostatic hypotension  Do you have any problems obtaining medications due to transportation or finances? yes Insurance coverage: [employer]  Patient denies hypoglycemic events.  Patient reports nocturia (nighttime urination). 4-5x nightly  Patient denies neuropathy (nerve pain). Patient denies visual changes. Patient reports self foot exams.   Patient reported dietary habits: Eats ~1 meals/day -  Meals are not consistent day to day.  Eats "whatever she can get her hands on" Patient reports having a "sweet tooth" and struggles the most with jolly ranchers and soft chews  Drinks: juice, soda, adding some water  Patient-reported exercise habits:  Not too active, very busy with school. She is more active around the house.  O:   Review of Systems  All other systems reviewed and are negative.   Physical Exam Vitals reviewed.  Constitutional:      Appearance: Normal appearance.  Pulmonary:     Effort: Pulmonary effort is normal.  Neurological:     Mental Status: She is alert.  Psychiatric:        Mood and Affect: Mood normal.        Behavior: Behavior normal.        Thought Content: Thought content normal.        Judgment: Judgment normal.    7 day average blood glucose: 218  Libre3 CGM Download today 10/25-11/07 % Time CGM is active: 64% Average Glucose: 218 mg/dL Glucose Management Indicator: 8.5  Glucose Variability: 18.7% (goal <36%) Time in Goal:  - Time in range 70-180: 18% - Time above  range: 82% - Time below range: 0%  Lab Results  Component Value Date   HGBA1C 9.4 (A) 09/25/2023   Vitals:   09/25/23 1335  BP: 133/74  Pulse: 77  SpO2: 100%    Lipid Panel     Component Value Date/Time   CHOL 93 (L) 08/14/2022 1111   TRIG 94 08/14/2022 1111   HDL 40 08/14/2022 1111   CHOLHDL 2.3  08/14/2022 1111   CHOLHDL 4.7 03/14/2022 0552   VLDL 20 03/14/2022 0552   LDLCALC 35 08/14/2022 1111   LDLDIRECT 65 01/05/2021 1457    Clinical Atherosclerotic Cardiovascular Disease (ASCVD): Yes  The ASCVD Risk score (Arnett DK, et al., 2019) failed to calculate for the following reasons:   The patient has a prior MI or stroke diagnosis   Patient is participating in a Managed Medicaid Plan:  Yes   A/P: LIBERATE Study: 6 month end of study  Diabetes longstanding currently uncontrolled. Patient is able to verbalize appropriate hypoglycemia management plan. Medication adherence appears okay (taking 5/7 days per week), but better than before. Control is suboptimal due to medication adherence issues, and not starting her semaglutide (Ozempic) back yet. -Continued empagliflozin (Jardiance) 10 mg once daily, semaglutide (Ozempic) 1 mg once weekly and encouraged adherence -Patient educated on purpose, proper use, and potential adverse effects.  -Extensively discussed pathophysiology of diabetes, recommended lifestyle interventions, dietary effects on blood sugar control.  -Counseled on s/sx of and management of hypoglycemia.  -Discussed upon restart of semaglutide (Ozempic) and if feelings of hypoglycemia, can send BG monitor/strips to pharmacy.   ASCVD risk - secondary prevention in patient with diabetes. Medication adherence okay (taking 5/7 days per week), but better than before.  -Continued atorvastatin 80 mg once daily, ezetimibe 10 mg once daily, Repatha (evolocumab) 140 mg every 14 days and encouraged adherence  Hypertension longstanding currently nearly controlled. Blood pressure goal of <130/80 mmHg. Medication adherence improved (taking 5/7 days per week).  Blood pressure control is suboptimal due to medication adherence issues. -Continued metoprolol tartrate 25 mg 0.5 tablet BID, valsartan-hydrochlorothiazide 80-12.5 mg once daily and encouraged adherence  Depression history and  requesting to restart sertraline 50mg  daily. -Resent prescription refill for this maintenance medication.   Patient also requested refills on empagliflozin (Jardiance) and ezetimibe sent to pharmacy of choice.   Written patient instructions provided. Patient verbalized understanding of treatment plan.  Total time in face to face counseling 36 minutes.    Follow-up:  Pharmacist PRN PCP clinic visit in January 2025 Patient seen with Shona Simpson, PharmD Candidate.

## 2023-09-26 NOTE — Progress Notes (Signed)
Reviewed and agree with Dr Koval's plan.   

## 2023-10-05 ENCOUNTER — Telehealth: Payer: Self-pay

## 2023-10-05 NOTE — Telephone Encounter (Signed)
Pharmacy Patient Advocate Encounter   Received notification from CoverMyMeds that prior authorization for ARIPIPRAZOLE is required/requested.   Insurance verification completed.   The patient is insured through [facility] .   PA required; PA submitted to above mentioned insurance via CoverMyMeds Key/confirmation #/EOC [id number]. Status is pending

## 2023-10-08 NOTE — Progress Notes (Signed)
Pharmacy Patient Advocate Encounter  Received notification from [facility] that Prior Authorization for ARIPIPRAZOLE has been APPROVED from 10/05/23 to 10/04/24   PA #/Case ID/Reference #: [id number]

## 2023-11-15 ENCOUNTER — Ambulatory Visit (INDEPENDENT_AMBULATORY_CARE_PROVIDER_SITE_OTHER): Payer: MEDICAID | Admitting: Family Medicine

## 2023-11-15 ENCOUNTER — Encounter: Payer: Self-pay | Admitting: Family Medicine

## 2023-11-15 VITALS — BP 161/91 | HR 69 | Ht 67.0 in | Wt 238.0 lb

## 2023-11-15 DIAGNOSIS — E1159 Type 2 diabetes mellitus with other circulatory complications: Secondary | ICD-10-CM | POA: Diagnosis not present

## 2023-11-15 DIAGNOSIS — Z1211 Encounter for screening for malignant neoplasm of colon: Secondary | ICD-10-CM

## 2023-11-15 DIAGNOSIS — F339 Major depressive disorder, recurrent, unspecified: Secondary | ICD-10-CM

## 2023-11-15 DIAGNOSIS — M5441 Lumbago with sciatica, right side: Secondary | ICD-10-CM

## 2023-11-15 DIAGNOSIS — E039 Hypothyroidism, unspecified: Secondary | ICD-10-CM | POA: Diagnosis not present

## 2023-11-15 DIAGNOSIS — I10 Essential (primary) hypertension: Secondary | ICD-10-CM

## 2023-11-15 DIAGNOSIS — E1169 Type 2 diabetes mellitus with other specified complication: Secondary | ICD-10-CM

## 2023-11-15 DIAGNOSIS — G8929 Other chronic pain: Secondary | ICD-10-CM

## 2023-11-15 DIAGNOSIS — E785 Hyperlipidemia, unspecified: Secondary | ICD-10-CM

## 2023-11-15 DIAGNOSIS — M5442 Lumbago with sciatica, left side: Secondary | ICD-10-CM

## 2023-11-15 DIAGNOSIS — I152 Hypertension secondary to endocrine disorders: Secondary | ICD-10-CM

## 2023-11-15 MED ORDER — CYCLOBENZAPRINE HCL 5 MG PO TABS: 5.0000 mg | ORAL_TABLET | Freq: Every evening | ORAL | 0 refills | Status: DC | PRN

## 2023-11-15 NOTE — Patient Instructions (Addendum)
 It was nice seeing you today. Your BP is elevated likely due to not taking your medication. I encouraged you to take all your medications as prescribed for overall good health and to prevent heart problems. I will see you back in about 2-3 months for diabetes check.

## 2023-11-15 NOTE — Assessment & Plan Note (Signed)
 Poor adherence to Synthroid TSH checked today

## 2023-11-15 NOTE — Assessment & Plan Note (Addendum)
 May be contributory to her B/L leg pain Continue Tylenol prn Add Flexeril prn pain at bed time - med escribed F/U soon if there is no improvement Consider imaging in the future She agreed with the plan

## 2023-11-15 NOTE — Progress Notes (Signed)
    SUBJECTIVE:   CHIEF COMPLAINT / HPI:   DM2/HLD/HTN: She is sporadic about medication compliance. The last dose of all her medications was two days ago. She misses about 2-3 days of meds per week, she says. She is on Lipitor  80 mg QD for HLD, as well as Zetia  10 mg QD and Repathaa 140mg  SQ Q 2 weeks.  For her BP, she is on Diovant-HTC 80/12.5 mg QD and Metoprolol  25 mg BID. For her DM, she is on Ozempic  1 mg weekly and Jardiance  10 mg QD.  Hypothyroidism: Sporadic compliance wih Synthroid  125 mcg QD  MDD: She is on Abilify  5 mg every day and Zoloft  50 mg every day. She misses a few doses per week. She plan an upcoming Psych appointment. Doing well otherwise  Leg pain: C/O B/L LL pain, which started from her hip down her thigh. She also endorsed associated lower back pain. This has been going on for a while and is worse at night as well as with ambulation. She described the pain as aching and throbbing about 6/10 in severity when she has it. Sometimes, it makes her cry at night. She denies trauma to her legs or back and has no cramping or weakness. 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 Pain respond some to Tylenol  prn.   PERTINENT  PMH / PSH: PMHx reviewed  OBJECTIVE:   BP (!) 161/91   Pulse 69   Ht 5' 7 (1.702 m)   Wt 238 lb (108 kg)   LMP 10/27/2023   SpO2 100%   BMI 37.28 kg/m   Physical Exam Vitals and nursing note reviewed.  Cardiovascular:     Rate and Rhythm: Normal rate and regular rhythm.     Heart sounds: Normal heart sounds. No murmur heard. Pulmonary:     Effort: Pulmonary effort is normal. No respiratory distress.     Breath sounds: Normal breath sounds. No wheezing or rhonchi.  Musculoskeletal:     Right upper leg: Normal.     Left upper leg: Normal.     Right lower leg: Normal.     Left lower leg: Normal.     Comments: Mild lumbar tenderness with no swelling or deformity. Mildly positive SLR test B/L with mild lumbar pain       ASSESSMENT/PLAN:   Type 2 diabetes mellitus with hyperlipidemia (HCC) Recent A1C reviewed - 9.4 Main concern is with poor medication adherence Counseling provided Continue current regimen F/U in 2-3 months for reassessment Bmet checked today FLP checked F/U with ophthalmology as planned  Hypertension associated with diabetes (HCC) BP elevated due to medication non-adherence Denies neurologic and cardiopulm symptoms and no acute findings on exam Counseling provided regarding adherence to her meds She will take meds as soon as she gets home  Hypothyroidism Poor adherence to Synthroid  TSH checked today  Major depressive disorder, recurrent (HCC) Stable on meds Compliance with meds discussed F/U with Psych as planned  Chronic back pain May be contributory to her B/L leg pain Continue Tylenol  prn Add Flexeril  prn pain at bed time - med escribed F/U soon if there is no improvement Consider imaging in the future She agreed with the plan   Health maintenance: Colon cancer screening discussed. She is interested in colonoscopy. GI referral placed.  Otto Fairly, MD [facility]

## 2023-11-15 NOTE — Assessment & Plan Note (Signed)
 Stable on meds Compliance with meds discussed F/U with Psych as planned

## 2023-11-15 NOTE — Assessment & Plan Note (Addendum)
 Recent A1C reviewed - 9.4 Main concern is with poor medication adherence Counseling provided Continue current regimen F/U in 2-3 months for reassessment Bmet checked today FLP checked F/U with ophthalmology as planned

## 2023-11-15 NOTE — Assessment & Plan Note (Signed)
 BP elevated due to medication non-adherence Denies neurologic and cardiopulm symptoms and no acute findings on exam Counseling provided regarding adherence to her meds She will take meds as soon as she gets home

## 2023-11-16 ENCOUNTER — Telehealth: Payer: Self-pay | Admitting: Family Medicine

## 2023-11-16 LAB — LIPID PANEL
Chol/HDL Ratio: 5.8 {ratio} — ABNORMAL HIGH (ref 0.0–4.4)
Cholesterol, Total: 173 mg/dL (ref 100–199)
HDL: 30 mg/dL — ABNORMAL LOW (ref >39)
LDL Chol Calc (NIH): 102 mg/dL — ABNORMAL HIGH (ref 0–99)
Triglycerides: 236 mg/dL — ABNORMAL HIGH (ref 0–149)
VLDL Cholesterol Cal: 41 mg/dL — ABNORMAL HIGH (ref 5–40)

## 2023-11-16 LAB — BASIC METABOLIC PANEL
BUN/Creatinine Ratio: 10 (ref 9–23)
BUN: 9 mg/dL (ref 6–24)
CO2: 24 mmol/L (ref 20–29)
Calcium: 8.9 mg/dL (ref 8.7–10.2)
Chloride: 106 mmol/L (ref 96–106)
Creatinine, Ser: 0.86 mg/dL (ref 0.57–1.00)
Glucose: 184 mg/dL — ABNORMAL HIGH (ref 70–99)
Potassium: 4.2 mmol/L (ref 3.5–5.2)
Sodium: 143 mmol/L (ref 134–144)
eGFR: 85 mL/min/{1.73_m2} (ref >59)

## 2023-11-16 LAB — TSH: TSH: 3.34 u[IU]/mL (ref 0.450–4.500)

## 2023-11-16 NOTE — Telephone Encounter (Signed)
 HIPAA compliant callback message left.  Please advise -   Normal TSH and she may continue current dose of her Synthroid   Cholesterol is elevated as expected due to medication non-adherence. Please resume your Lipitor  80 mg daily and continue Zetia  and Repatha  as well. We will repeat lab in 6 months.  Thanks.

## 2023-11-25 ENCOUNTER — Encounter (HOSPITAL_COMMUNITY): Payer: Self-pay | Admitting: Emergency Medicine

## 2023-11-25 ENCOUNTER — Other Ambulatory Visit: Payer: Self-pay

## 2023-11-25 ENCOUNTER — Emergency Department (HOSPITAL_COMMUNITY)
Admission: EM | Admit: 2023-11-25 | Discharge: 2023-11-25 | Disposition: A | Discharge status: Home / Self Care | Payer: MEDICAID | Attending: Emergency Medicine | Admitting: Emergency Medicine

## 2023-11-25 DIAGNOSIS — Z7982 Long term (current) use of aspirin: Secondary | ICD-10-CM | POA: Insufficient documentation

## 2023-11-25 DIAGNOSIS — E119 Type 2 diabetes mellitus without complications: Secondary | ICD-10-CM | POA: Insufficient documentation

## 2023-11-25 DIAGNOSIS — I10 Essential (primary) hypertension: Secondary | ICD-10-CM | POA: Diagnosis not present

## 2023-11-25 DIAGNOSIS — Z794 Long term (current) use of insulin: Secondary | ICD-10-CM | POA: Diagnosis not present

## 2023-11-25 DIAGNOSIS — Z79899 Other long term (current) drug therapy: Secondary | ICD-10-CM | POA: Insufficient documentation

## 2023-11-25 DIAGNOSIS — Z955 Presence of coronary angioplasty implant and graft: Secondary | ICD-10-CM | POA: Insufficient documentation

## 2023-11-25 DIAGNOSIS — Z7984 Long term (current) use of oral hypoglycemic drugs: Secondary | ICD-10-CM | POA: Insufficient documentation

## 2023-11-25 DIAGNOSIS — I2510 Atherosclerotic heart disease of native coronary artery without angina pectoris: Secondary | ICD-10-CM | POA: Insufficient documentation

## 2023-11-25 DIAGNOSIS — H66001 Acute suppurative otitis media without spontaneous rupture of ear drum, right ear: Secondary | ICD-10-CM | POA: Diagnosis not present

## 2023-11-25 DIAGNOSIS — H9201 Otalgia, right ear: Secondary | ICD-10-CM | POA: Diagnosis present

## 2023-11-25 MED ORDER — AMOXICILLIN 875 MG PO TABS: 875.0000 mg | ORAL_TABLET | Freq: Two times a day (BID) | ORAL | 0 refills | Status: AC

## 2023-11-25 MED ORDER — ACETAMINOPHEN 325 MG PO TABS
650.0000 mg | ORAL_TABLET | Freq: Once | ORAL | Status: AC
  Administered 2023-11-25: 650 mg via ORAL
  Filled 2023-11-25: qty 2

## 2023-11-25 MED ORDER — AMOXICILLIN 500 MG PO CAPS
500.0000 mg | ORAL_CAPSULE | Freq: Once | ORAL | Status: AC
  Administered 2023-11-25: 500 mg via ORAL
  Filled 2023-11-25: qty 1

## 2023-11-25 MED ORDER — SALINE SPRAY 0.65 % NA SOLN: 1.0000 | NASAL | 0 refills | Status: AC | PRN

## 2023-11-25 NOTE — ED Provider Notes (Signed)
 [place] EMERGENCY DEPARTMENT AT [facility] Provider Note   CSN: [id number] Arrival date & time: 11/25/23  9043     History  Chief Complaint  Patient presents with   Otalgia    Tanya Nolan is a 48 y.o. female.  Patient is a 48 year old female with a past medical history of CAD status post CABG, hypertension and diabetes presenting to the emergency department with right ear pain.  The patient states that she has been sick for about the last week with cough and congestion.  She states that last night she started develop some pain in her right ear.  She states that she noticed a small amount of bloody drainage.  She denies sticking any Q-tips or other foreign bodies in her ear.  She states that she has had known sick contacts.  The history is provided by the patient.  Otalgia      Home Medications Prior to Admission medications   Medication Sig Start Date End Date Taking? Authorizing Provider  amoxicillin  (AMOXIL ) 875 MG tablet Take 1 tablet (875 mg total) by mouth 2 (two) times daily for 5 days. 11/25/23 11/30/23 Yes Ellouise, Clark Clowdus K, DO  sodium chloride  (OCEAN) 0.65 % SOLN nasal spray Place 1 spray into both nostrils as needed for congestion. 11/25/23  Yes Ellouise, Taegen Delker K, DO  Accu-Chek FastClix Lancets MISC Use to check CBG TID Patient not taking: Reported on 06/26/2023 07/22/19   Anders Otto DASEN, MD  acetaminophen  (TYLENOL ) 500 MG tablet Take 1,000 mg by mouth every 6 (six) hours as needed for moderate pain (pain score 4-6) or headache. Patient not taking: Reported on 11/15/2023    [provider]  albuterol  (PROAIR  HFA) 108 (90 Base) MCG/ACT inhaler Inhale 1-2 puffs into the lungs every 6 (six) hours as needed for wheezing or shortness of breath. Patient not taking: Reported on 06/01/2023 05/20/21   Anders Otto DASEN, MD  ARIPiprazole  (ABILIFY ) 5 MG tablet Take 1 tablet (5 mg total) by mouth daily. 09/11/23   McDiarmid, Krystal BIRCH, MD  artificial tears  (LACRILUBE) OINT ophthalmic ointment Place into the left eye at bedtime as needed for dry eyes. Patient not taking: Reported on 06/26/2023 06/29/22   Towana Ozell BROCKS, MD  aspirin  EC 81 MG tablet Take 1 tablet (81 mg total) by mouth daily. Swallow whole. 06/01/23   Anders Otto DASEN, MD  atorvastatin  (LIPITOR ) 80 MG tablet Take 1 tablet (80 mg total) by mouth daily. 06/26/23   McDiarmid, Krystal BIRCH, MD  Blood Glucose Monitoring Suppl (ACCU-CHEK GUIDE) w/Device KIT 1 applicator by Does not apply route 3 (three) times daily. Use to check CBG TID Patient not taking: Reported on 06/26/2023 07/22/19   Anders Otto DASEN, MD  cetirizine  (ZYRTEC ) 10 MG tablet Take 1 tablet (10 mg total) by mouth daily as needed for allergies. Patient not taking: Reported on 11/15/2023 03/21/22   Dwan Kyla HERO, PA-C  clopidogrel  (PLAVIX ) 75 MG tablet Take 1 tablet (75 mg total) by mouth daily. 09/11/23   McDiarmid, Krystal BIRCH, MD  cyclobenzaprine  (FLEXERIL ) 5 MG tablet Take 1 tablet (5 mg total) by mouth at bedtime as needed for muscle spasms. 11/15/23   Anders Otto DASEN, MD  empagliflozin  (JARDIANCE ) 10 MG TABS tablet Take 1 tablet (10 mg total) by mouth daily. 09/25/23   McDiarmid, Krystal BIRCH, MD  Evolocumab  (REPATHA  SURECLICK) 140 MG/ML SOAJ INJECT 140 MG INTO THE SKIN EVERY 14 DAYS 06/25/23   Anner Alm ORN, MD  ezetimibe  (ZETIA ) 10  MG tablet Take 1 tablet (10 mg total) by mouth daily. 09/25/23   McDiarmid, Krystal BIRCH, MD  glucose blood (ACCU-CHEK GUIDE) test strip USE TO CHECK GBG 3 TIMES A DAY Patient not taking: Reported on 06/26/2023 04/13/21   Simmons-Robinson, Rockie, MD  levothyroxine  (SYNTHROID ) 125 MCG tablet Take 1 tablet (125 mcg total) by mouth daily before breakfast. Reported on 02/23/2016 06/01/23   Anders Otto DASEN, MD  metoprolol  tartrate (LOPRESSOR ) 25 MG tablet Take 0.5 tablets (12.5 mg total) by mouth 2 (two) times daily. 09/11/23   McDiarmid, Krystal BIRCH, MD  Semaglutide , 1 MG/DOSE, 4 MG/3ML SOPN Inject 1 mg into the skin once  a week. 09/11/23   McDiarmid, Krystal BIRCH, MD  sertraline  (ZOLOFT ) 50 MG tablet Take 1 tablet (50 mg total) by mouth daily. For depression 09/25/23   McDiarmid, Krystal BIRCH, MD  valsartan -hydrochlorothiazide  (DIOVAN -HCT) 80-12.5 MG tablet Take 1 tablet by mouth daily. 09/11/23   McDiarmid, Krystal BIRCH, MD  insulin  lispro protamine -insulin  lispro (HUMALOG  75/25) (75-25) 100 UNIT/ML SUSP Inject 40-50 Units into the skin 2 (two) times daily with a meal. Patient takes 40 units in the morning and 50 units at night.  Verified it is Humalog  75/25.  04/18/12  [provider]      Allergies    Metformin    Review of Systems   Review of Systems  HENT:  Positive for ear pain.     Physical Exam Updated Vital Signs BP (!) 195/87   Pulse 68   Temp 98.4 F (36.9 C)   Resp 17   Ht 5' 7 (1.702 m)   Wt 107 kg   LMP 10/27/2023   SpO2 97%   BMI 36.95 kg/m  Physical Exam Vitals and nursing note reviewed.  Constitutional:      General: She is not in acute distress.    Appearance: Normal appearance.  HENT:     Head: Normocephalic and atraumatic.     Right Ear: Ear canal and external ear normal. A middle ear effusion is present. Tympanic membrane is erythematous and bulging.     Left Ear: Tympanic membrane, ear canal and external ear normal.     Ears:     Comments: No pain with pulling on the pinnae, no mastoid tenderness on the right    Nose: Nose normal.     Mouth/Throat:     Mouth: Mucous membranes are moist.     Pharynx: Oropharynx is clear.  Eyes:     Extraocular Movements: Extraocular movements intact.     Conjunctiva/sclera: Conjunctivae normal.  Cardiovascular:     Rate and Rhythm: Normal rate and regular rhythm.     Heart sounds: Normal heart sounds.  Pulmonary:     Effort: Pulmonary effort is normal.     Breath sounds: Normal breath sounds.  Abdominal:     General: Abdomen is flat.  Musculoskeletal:        General: Normal range of motion.     Cervical back: Normal range of motion.   Skin:    General: Skin is warm and dry.  Neurological:     General: No focal deficit present.     Mental Status: She is alert and oriented to person, place, and time.  Psychiatric:        Mood and Affect: Mood normal.        Behavior: Behavior normal.     ED Results / Procedures / Treatments   Labs (all labs ordered are listed, but only abnormal results  are displayed) Labs Reviewed - No data to display  EKG None  Radiology No results found.  Procedures Procedures    Medications Ordered in ED Medications  amoxicillin  (AMOXIL ) capsule 500 mg (has no administration in time range)  acetaminophen  (TYLENOL ) tablet 650 mg (has no administration in time range)    ED Course/ Medical Decision Making/ A&P                                 Medical Decision Making This patient presents to the ED with chief complaint(s) of ear pain with pertinent past medical history of CAD status post CABG, hypertension, diabetes which further complicates the presenting complaint. The complaint involves an extensive differential diagnosis and also carries with it a high risk of complications and morbidity.    The differential diagnosis includes patient does have bulging with purulence behind the TM and erythema consistent with otitis media, no evidence of otitis externa, no signs of mastoiditis, no signs of dental infection, considering viral infection  Additional history obtained: Additional history obtained from N/A Records reviewed Primary Care Documents  ED Course and Reassessment: On patient's arrival she is hypertensive but otherwise hemodynamically stable in no acute distress.  Exam is consistent with otitis media.  Likely caused by recent viral infection.  Symptoms of her cough and congestion have been going ongoing for at least 1 week so she is outside the window of antiviral treatment and declined viral swab today.  She will be treated with antibiotics and recommended close primary care  follow-up.  She is stable for discharge home.  Independent labs interpretation:  N/A  Independent visualization of imaging: - N/A  Consultation: - Consulted or discussed management/test interpretation w/ external professional: N/A  Consideration for admission or further workup: Patient has no emergent conditions requiring admission or further work-up at this time and is stable for discharge home with primary care follow-up  Social Determinants of health: N/A            Final Clinical Impression(s) / ED Diagnoses Final diagnoses:  Non-recurrent acute suppurative otitis media of right ear without spontaneous rupture of tympanic membrane    Rx / DC Orders ED Discharge Orders          Ordered    amoxicillin  (AMOXIL ) 875 MG tablet  2 times daily        11/25/23 1102    sodium chloride  (OCEAN) 0.65 % SOLN nasal spray  As needed        11/25/23 1102              Olivette, Ellarae Nevitt K, DO 11/25/23 1103

## 2023-11-25 NOTE — ED Triage Notes (Signed)
 Pt endorses ear ache to right side since last night. Endorses red drainage. Also having a cough

## 2023-11-25 NOTE — Discharge Instructions (Addendum)
 You were seen in the emergency department for your ear pain.  You do have an inner ear infection and I have given you a course of antibiotics he should complete this as prescribed.  You can take Tylenol  or Motrin  as needed for pain I have also given you a nasal spray to help with your congestion and your symptoms.  You can follow-up with your primary doctor in the next few days to have your symptoms rechecked.  You should return to the emergency department for fevers despite the antibiotic, if you get pain or swelling behind your ear or if you have any other new or concerning symptoms.

## 2023-11-28 ENCOUNTER — Other Ambulatory Visit: Payer: Self-pay | Admitting: Family Medicine

## 2023-11-28 DIAGNOSIS — Z1231 Encounter for screening mammogram for malignant neoplasm of breast: Secondary | ICD-10-CM

## 2023-12-04 ENCOUNTER — Ambulatory Visit
Admission: RE | Admit: 2023-12-04 | Discharge: 2023-12-04 | Disposition: A | Discharge status: Home / Self Care | Payer: MEDICAID | Source: Ambulatory Visit | Attending: Family Medicine | Admitting: Family Medicine

## 2023-12-04 DIAGNOSIS — Z1231 Encounter for screening mammogram for malignant neoplasm of breast: Secondary | ICD-10-CM

## 2023-12-05 ENCOUNTER — Encounter: Payer: Self-pay | Admitting: Family Medicine

## 2023-12-13 ENCOUNTER — Other Ambulatory Visit: Payer: Self-pay

## 2023-12-13 ENCOUNTER — Encounter: Payer: Self-pay | Admitting: Nurse Practitioner

## 2023-12-13 ENCOUNTER — Ambulatory Visit: Payer: MEDICAID | Admitting: Nurse Practitioner

## 2023-12-13 ENCOUNTER — Telehealth: Payer: Self-pay

## 2023-12-13 VITALS — BP 110/72 | HR 67 | Ht 67.0 in | Wt 232.0 lb

## 2023-12-13 DIAGNOSIS — Z7901 Long term (current) use of anticoagulants: Secondary | ICD-10-CM

## 2023-12-13 DIAGNOSIS — E669 Obesity, unspecified: Secondary | ICD-10-CM

## 2023-12-13 DIAGNOSIS — K625 Hemorrhage of anus and rectum: Secondary | ICD-10-CM | POA: Diagnosis not present

## 2023-12-13 DIAGNOSIS — E119 Type 2 diabetes mellitus without complications: Secondary | ICD-10-CM | POA: Diagnosis not present

## 2023-12-13 DIAGNOSIS — Z1211 Encounter for screening for malignant neoplasm of colon: Secondary | ICD-10-CM

## 2023-12-13 DIAGNOSIS — K5909 Other constipation: Secondary | ICD-10-CM

## 2023-12-13 DIAGNOSIS — Z7985 Long-term (current) use of injectable non-insulin antidiabetic drugs: Secondary | ICD-10-CM

## 2023-12-13 MED ORDER — SUFLAVE 178.7 G PO SOLR: 1.0000 | Freq: Once | ORAL | 0 refills | Status: DC

## 2023-12-13 MED ORDER — SUFLAVE 178.7 G PO SOLR: 1.0000 | Freq: Once | ORAL | 0 refills | Status: AC

## 2023-12-13 NOTE — Telephone Encounter (Signed)
[facility] Pre-operative Risk Assessment     Request for surgical clearance:     Colonoscopy Procedure  What type of surgery is being performed?     Colonoscopy  When is this surgery scheduled?     02/01/24  What type of clearance is required ?   Pharmacy  Are there any medications that need to be held prior to surgery and how long? Plavix 5 days hold  Practice name and name of physician performing surgery?      [facility]  What is your office phone and fax number?      Phone- [phone]  Fax- [phone]  Anesthesia type (None, local, MAC, general) ?       MAC   Please route your response to [facility]

## 2023-12-13 NOTE — Progress Notes (Signed)
Brief Narrative 48 y.o. yo female , new to the practice with a past medical history not limited to CAD status post CABG , HDL, diabetes, obesity, hypertension , hypothyroidism, depression, chronic back pain, tobacco abuse   ASSESSMENT    Colon cancer screening.  No prior colonoscopy. No FMH of colon cancer  Occasional painless rectal bleeding which she suspects to be related to hemorrhoids  Chronic constipation, currently untreated  CAD status post NSTEMI, CABG x 3 .  On Plavix  Diabetes / obesity.  Hemoglobin A1c 8.8% July 2024 Takes Ozempic  See PMH for any additional medical history   PLAN    --Schedule for screening colonoscopy with a 2-day bowel prep.  The risks and benefits of colonoscopy with possible polypectomy / biopsies were discussed and the patient agrees to proceed.  --Hold Plavix for 5 days before procedure - will instruct when and how to resume after procedure. Patient understands that there is a low but real risk of cardiovascular event such as heart attack, stroke, or embolism /  thrombosis, or ischemia while off Plavix. The patient consents to proceed. Will communicate by phone or EMR with patient's prescribing provider to confirm that holding Plavix is reasonable in this case.  --Needs treatment of chronic constipation. We discussed starting a daily fiber supplement but she prefers to resume daily MiraLAX. Resume Miralax 1 capful mixed in 8 ounces of water daily   HPI   Chief complaint : No complaints.  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 She gives a history of chronic intermittent constipation with decreased frequency of stools and straining.  Previously advised by one of her providers to take MiraLAX every day.  She does not like the taste so she does not take it.  She does not take anything for constipation.  She drinks 3-4 bottles of water a day.  Arihanna has never had a colonoscopy.  She has no family history of colon cancer.  She  takes Plavix.  She has not seen cardiology it appears since October 2023.  She is compliant with Plavix.  No complaints of chest pain or shortness of breath .  Julyanna has no upper GI symptoms   She takes Ozempic.   Labs      Latest Ref Rng & Units 11/30/2022    8:23 PM 06/29/2022    8:05 PM 04/07/2022    9:25 AM  CBC  WBC 4.0 - 10.5 K/uL 9.9  9.5  10.1   Hemoglobin 12.0 - 15.0 g/dL 16.1  09.6  04.5   Hematocrit 36.0 - 46.0 % 41.3  37.0  33.5   Platelets 150 - 400 K/uL 234  247  430     No results found for: "LIPASE"    Latest Ref Rng & Units 11/15/2023    2:16 PM 11/30/2022    8:23 PM 08/14/2022   11:12 AM  CMP  Glucose 70 - 99 mg/dL 409  811    BUN 6 - 24 mg/dL 9  8    Creatinine 9.14 - 1.00 mg/dL 7.82  9.56    Sodium 213 - 144 mmol/L 143  136    Potassium 3.5 - 5.2 mmol/L 4.2  3.8    Chloride 96 - 106 mmol/L 106  103    CO2 20 - 29 mmol/L 24  25    Calcium 8.7 - 10.2 mg/dL 8.9  8.6    Total Protein 6.0 - 8.5 g/dL   6.6  Total Bilirubin 0.0 - 1.2 mg/dL   <1.6   Alkaline Phos 44 - 121 IU/L   79   AST 0 - 40 IU/L   15   ALT 0 - 32 IU/L   14     Past Medical History:  Diagnosis Date   Abdominal pain 03/26/2009   Qualifier: Diagnosis of  By: Jimmey Ralph MD, Jennifer     Anxiety    BACK STRAIN, LUMBAR 03/26/2009   Qualifier: Diagnosis of  By: Jimmey Ralph MD, Victorino Dike     Chalazion 10/01/2007   Qualifier: Diagnosis of  By: Karn Pickler MD, Jessica     Chronic back pain    tx with ibuprofen   Diabetes mellitus    Hypercholesteremia    Hyperlipidemia    no meds - tx with diet   Hypertension    Hypothyroidism    Incontinence in female 10/05/2020   Ingrown toenail 12/25/2014   Ischemic heart disease due to coronary artery obstruction (HCC) 03/16/2022   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   C&B WNL  NEG PAPS after   MDD (major depressive disorder), recurrent severe, 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   Nipple discharge 10/13/2013   Nodule of skin of left foot  04/15/2021   Non compliance w medication regimen 10/13/2013   Non-ST elevation (NSTEMI) myocardial infarction [facility])    Simple endometrial hyperplasia 09/2006   BENIGN SECRETORY 02/2007   Thyroid dysfunction    Past Surgical History:  Procedure Laterality Date   CORONARY ARTERY BYPASS GRAFT N/A 03/17/2022   Procedure: CORONARY ARTERY BYPASS GRAFTING X THREE, USING LEFT INTERNAL MAMMARY ARTERY, LEFT RADIAL ARTERY, RIGHT GREATER SAPHENOUS VEIN HARVESTED ENDOSCOPICALLY;  Surgeon: Alleen Borne, MD;  Location: [facility] OR;  Service: Open Heart Surgery;  Laterality: N/A;   DISC DECOMPRESSION  2008   HYSTEROSCOPY X 2  2007 / 2012   ENDOMETRIAL POLYPS REMOVED   LEFT HEART CATH AND CORONARY ANGIOGRAPHY N/A 03/15/2022   Procedure: LEFT HEART CATH AND CORONARY ANGIOGRAPHY;  Surgeon: Kathleene Hazel, MD;  Location: [facility] INVASIVE CV LAB;  Service: Cardiovascular;  Laterality: N/A;   RADIAL ARTERY HARVEST Left 03/17/2022   Procedure: RADIAL ARTERY HARVEST;  Surgeon: Alleen Borne, MD;  Location: [facility] OR;  Service: Open Heart Surgery;  Laterality: Left;   TEE WITHOUT CARDIOVERSION N/A 03/17/2022   Procedure: TRANSESOPHAGEAL ECHOCARDIOGRAM (TEE);  Surgeon: Alleen Borne, MD;  Location: [facility] OR;  Service: Open Heart Surgery;  Laterality: N/A;   Family History  Problem Relation Age of Onset   Lupus Mother    Hypertension Mother    Heart disease Father    Gout Father    Lupus Father    Hypertension Sister    Breast cancer Neg Hx    Social History   Tobacco Use   Smoking status: Every Day    Current packs/day: 1.00    Average packs/day: 1 pack/day for 8.0 years (8.0 ttl pk-yrs)    Types: Cigarettes    Passive exposure: Current   Smokeless tobacco: Never  Vaping Use   Vaping status: Never Used  Substance Use Topics   Alcohol use: Yes    Alcohol/week: 0.0 standard drinks of alcohol    Comment: socially - wine/liquor--Rare   Drug use: Yes    Frequency: 7.0 times per week    Types: Marijuana    Current Outpatient Medications  Medication Sig Dispense Refill   Accu-Chek FastClix Lancets MISC Use to check CBG TID 100 each 12   acetaminophen (TYLENOL)  500 MG tablet Take 1,000 mg by mouth every 6 (six) hours as needed for moderate pain (pain score 4-6) or headache.     albuterol (PROAIR HFA) 108 (90 Base) MCG/ACT inhaler Inhale 1-2 puffs into the lungs every 6 (six) hours as needed for wheezing or shortness of breath. 18 g 1   ARIPiprazole (ABILIFY) 5 MG tablet Take 1 tablet (5 mg total) by mouth daily. 30 tablet 3   artificial tears (LACRILUBE) OINT ophthalmic ointment Place into the left eye at bedtime as needed for dry eyes. 3.5 g 0   aspirin EC 81 MG tablet Take 1 tablet (81 mg total) by mouth daily. Swallow whole. 90 tablet 1   atorvastatin (LIPITOR) 80 MG tablet Take 1 tablet (80 mg total) by mouth daily. 90 tablet 3   Blood Glucose Monitoring Suppl (ACCU-CHEK GUIDE) w/Device KIT 1 applicator by Does not apply route 3 (three) times daily. Use to check CBG TID 1 kit 0   cetirizine (ZYRTEC) 10 MG tablet Take 1 tablet (10 mg total) by mouth daily as needed for allergies.     clopidogrel (PLAVIX) 75 MG tablet Take 1 tablet (75 mg total) by mouth daily. 90 tablet 1   cyclobenzaprine (FLEXERIL) 5 MG tablet Take 1 tablet (5 mg total) by mouth at bedtime as needed for muscle spasms. 30 tablet 0   empagliflozin (JARDIANCE) 10 MG TABS tablet Take 1 tablet (10 mg total) by mouth daily. 30 tablet 11   Evolocumab (REPATHA SURECLICK) 140 MG/ML SOAJ INJECT 140 MG INTO THE SKIN EVERY 14 DAYS 2 mL 6   ezetimibe (ZETIA) 10 MG tablet Take 1 tablet (10 mg total) by mouth daily. 30 tablet 11   glucose blood (ACCU-CHEK GUIDE) test strip USE TO CHECK GBG 3 TIMES A DAY 100 strip 1   levothyroxine (SYNTHROID) 125 MCG tablet Take 1 tablet (125 mcg total) by mouth daily before breakfast. Reported on 02/23/2016 90 tablet 1   metoprolol tartrate (LOPRESSOR) 25 MG tablet Take 0.5 tablets (12.5 mg total) by mouth 2  (two) times daily. 45 tablet 1   Semaglutide, 1 MG/DOSE, 4 MG/3ML SOPN Inject 1 mg into the skin once a week. 3 mL 1   sertraline (ZOLOFT) 50 MG tablet Take 1 tablet (50 mg total) by mouth daily. For depression 30 tablet 3   sodium chloride (OCEAN) 0.65 % SOLN nasal spray Place 1 spray into both nostrils as needed for congestion. 15 mL 0   SUFLAVE 178.7 g SOLR Take 1 kit by mouth once for 1 dose. 1 each 0   valsartan-hydrochlorothiazide (DIOVAN-HCT) 80-12.5 MG tablet Take 1 tablet by mouth daily. 30 tablet 11   No current facility-administered medications for this visit.   Allergies  Allergen Reactions   Metformin Diarrhea     Review of Systems: Positive for allergy, sinus trouble, anxiety, vision changes, cough, depression, urine leakage..  All other systems reviewed and negative except where noted in HPI.   Wt Readings from Last 3 Encounters:  12/13/23 232 lb (105.2 kg)  11/25/23 235 lb 14.3 oz (107 kg)  11/15/23 238 lb (108 kg)    Physical Exam:  BP 110/72   Pulse 67   Ht 5\' 7"  (1.702 m)   Wt 232 lb (105.2 kg)   LMP 10/27/2023   BMI 36.34 kg/m  Constitutional:  Pleasant, generally well appearing female in no acute distress. Psychiatric:  Normal mood and affect. Behavior is normal. EENT: Pupils normal.  Conjunctivae are normal. No scleral icterus.  Neck supple.  Cardiovascular: Normal rate, regular rhythm.  Pulmonary/chest: Effort normal and breath sounds normal. No wheezing, rales or rhonchi. Abdominal: Soft, nondistended, nontender. Bowel sounds active throughout. There are no masses palpable. No hepatomegaly. Neurological: Alert and oriented to person place and time. Extremities:  No edema   Willette Cluster, NP  12/13/2023, 2:03 PM  Cc:  Referring Provider Doreene Eland, MD

## 2023-12-13 NOTE — Patient Instructions (Addendum)
You have been scheduled for a colonoscopy. Please follow written instructions given to you at your visit today.   Please pick up your prep supplies at the pharmacy within the next 1-3 days.  If you use inhalers (even only as needed), please bring them with you on the day of your procedure.  DO NOT TAKE 7 DAYS PRIOR TO TEST- Trulicity (dulaglutide) Ozempic, Wegovy (semaglutide) Mounjaro (tirzepatide) Bydureon Bcise (exanatide extended release)  DO NOT TAKE 1 DAY PRIOR TO YOUR TEST Rybelsus (semaglutide) Adlyxin (lixisenatide) Victoza (liraglutide) Byetta (exanatide) ___________________________________________________________________________  Tanya Nolan will be contacted by our office prior to your procedure for directions on holding your Plavix.  If you do not hear from our office 1 week prior to your scheduled procedure, please call [phone] to discuss.   You will receive your bowel preparation through [facility], which ensures the lowest copay and home delivery, with outreach via text or call from an [phone] number. Please respond promptly to avoid rescheduling. If you are interested in alternative options or have any questions please contact them at [phone]  Your Provider Has Sent Your Bowel Prep Regimen To [facility] What to expect. [facility] will contact you to verify your information and collect your copay, if applicable. Enjoy the comfort of your home while we deliver your prescription to you, free of any shipping charges. Fast, FREE delivery or shipping. [facility] accepts all major insurance benefits and applies discounts & coupons  Have additional questions? [facility]'s patient care team is always here to help.  Chat: [email or web address] Call: [phone] Email: [email or web address] NCPDP: [id number] How will we contact you? Welcome Phone call  a Welcome text and a Checkout link in a text Texts you receive from [phone] Are Not Spam.   *To set up delivery,  you must complete the checkout process via link or speak to one of our patient care representatives. If we are unable to reach you, your prescription may be delayed.  To avoid waiting on hold if you call. Utilize the secure chat feature and request [facility] call you to complete the transaction or expedite your concerns.   Continue taking Miralax daily  _______________________________________________________  If your blood pressure at your visit was 140/90 or greater, please contact your primary care physician to follow up on this.  _______________________________________________________  If you are age 41 or older, your body mass index should be between 23-30. Your Body mass index is 36.34 kg/m. If this is out of the aforementioned range listed, please consider follow up with your Primary Care Provider.  If you are age 61 or younger, your body mass index should be between 19-25. Your Body mass index is 36.34 kg/m. If this is out of the aformentioned range listed, please consider follow up with your Primary Care Provider.   ________________________________________________________  The [facility] providers would like to encourage you to use [facility] to communicate with providers for non-urgent requests or questions.  Due to long hold times on the telephone, sending your provider a message by [facility] may be a faster and more efficient way to get a response.  Please allow 48 business hours for a response.  Please remember that this is for non-urgent requests.  _______________________________________________________  Due to recent changes in healthcare laws, you may see the results of your imaging and laboratory studies on MyChart before your provider has had a chance to review them.  We understand that in some cases there may be results that are confusing or concerning to you. Not all laboratory results  come back in the same time frame and the provider may be waiting for multiple results in  order to interpret others.  Please give [place] 48 hours in order for your provider to thoroughly review all the results before contacting the office for clarification of your results.   Thank you for entrusting me with your care and choosing [facility].  Willette Cluster NP

## 2023-12-13 NOTE — Telephone Encounter (Signed)
    Primary Cardiologist:David Herbie [place], MD  Chart reviewed as part of pre-operative protocol coverage. Because of Sholanda Croson Peckham's past medical history and time since last visit, he/she will require a follow-up visit in order to better assess preoperative cardiovascular risk.  Pre-op covering staff: - Please schedule Office appointment and call patient to inform them. - Please contact requesting surgeon's office via preferred method (i.e, phone, fax) to inform them of need for appointment prior to surgery.  If applicable, this message will also be routed to pharmacy pool and/or primary cardiologist for input on holding anticoagulant/antiplatelet agent as requested below so that this information is available at time of patient's appointment.   Ronney Asters, NP  12/13/2023, 1:00 PM

## 2023-12-14 NOTE — Telephone Encounter (Signed)
Left message for the pt to call back to schedule in office appt for pre op clearance.  

## 2023-12-17 NOTE — Telephone Encounter (Signed)
2nd attempt to reach pt regarding need for an IN OFFICE appointment.  Left another message for pt to call back.

## 2023-12-18 NOTE — Telephone Encounter (Signed)
Pt has appt 01/10/24 with Reather Littler, NP.

## 2023-12-19 NOTE — Telephone Encounter (Signed)
 Can we keep an eye on this clearance from cards Her appt with cards is 01/10/24

## 2024-01-09 NOTE — Progress Notes (Deleted)
 Cardiology Office Note    Date:  01/09/2024  ID:  HALIEGH KHURANA, DOB Sep 24, 1976, MRN [id number] PCP:  Doreene Eland, MD  Cardiologist:  Bryan Lemma, MD  Electrophysiologist:  None   Chief Complaint: ***  History of Present Illness: .    AYDA TANCREDI is a 48 y.o. female with visit-pertinent history of CAD s/p NSTEMI, CABG x 3, hypertension, hyperlipidemia, hypothyroidism, PCOS, tobacco use, Bell's palsy and chronic back pain.  She presents today for follow-up and preoperative cardiac evaluation.  On 03/13/2022 she presented to the ED with complaints of seizure-like activity associate with urinary incontinence and tongue biting, unresponsiveness and chest pain during intercourse.  Patient was unable to recall events.  Troponin was elevated, chest x-ray was unremarkable, EKG was overall normal, MRI of the head and EEG were negative.  Patient did report a 1 month history of chest pain on exertion prior to ED visit which she described as a sharp pain often relieved with rest.  Patient was hospitalized in setting of NSTEMI.  Echocardiogram showed EF 60 to 65%, normal LV function, no RWMA, moderate concentric LVH, indeterminate diastolic parameters.  Coronary CT angiogram showed calcium score of 709, 90th percentile for age and sex matched control.  CT FFR analysis showed low limiting lesions in the LAD and left circumflex with total occlusion in the proximal RCA.  Follow-up cardiac catheterization on 03/15/2022 revealed P RCA 100%, PM LCx, OM2 90%, pLAD 80%, and LAD 80% stenosed.  CT surgery was consulted and she underwent CABG.  She was referred to lipid clinic Pharm.D. and was started on Repatha.  On 06/29/2022 she presented to the ED with complaints of left facial drooping and chest pain.  Troponin was negative, D-dimer was mildly elevated, CT angio chest was negative for acute findings.  EKG was unremarkable.  BP was elevated, she was treated for Bell's palsy and discharged home in stable  condition.  Patient was last seen in office on 08/14/2022.  She remained stable from a cardiac standpoint, is noted that she did continue to smoke.  Today she presents for follow-up and preoperative cardiac evaluation.  She reports that she  Preoperative cardiac evaluation: Colonoscopy on 02/01/24 with [facility].  {Select to add RCRI Risk (<1%=LOW; >/=1%=HIGH) (Optional):21036017}  {Select if HIGH (RCRI >/=1%) Risk (Optional):21036030} Recommendations: {2014 ACC/AHA Perioperative Guidelines  :21036001} Antiplatelet and/or Anticoagulation Recommendations: {Antiplatelet Recommendations                  :21036016} {Anticoagulation Recommendations           :40981191}   CAD: S/p CABG x 3 with LIMA to LAD, left radial artery graft to OM and SVG to PDA on 03/17/2022.  Echo showed EF 66 5%, normal LV function, no RWMA, moderate concentric LVH, indeterminate diastolic parameters. Stable with no anginal symptoms. No indication for ischemic evaluation.   Continue aspirin, Plavix, amlodipine, metoprolol, Lipitor, Zetia and Repatha.  Hypertension: Blood pressure today Monitor blood pressure and report if consistently above 130/80. Continue current antihypertensive regimen.  Hyperlipidemia: Last lipid profile on 11/15/2023 indicated total cholesterol 173, triglycerides 237, HDL 30 and LDL 102.  Per notes patient has had intermittent adherence.  Per patient she has restarted on  Type 2 diabetes mellitus: Last hemoglobin A1c 9.4 on 09/25/23. Monitored and managed per PCP.  Hypothyroidism: Last TSH 3.340 on 11/15/23. Monitored and managed by PCP.   Tobacco use: Patient reports that she is currently smoking    Labwork independently reviewed: 11/15/2023:  Sodium 143, potassium 4.2, creatinine 0.86  ROS: .   *** denies chest pain, shortness of breath, lower extremity edema, fatigue, palpitations, melena, hematuria, hemoptysis, diaphoresis, weakness, presyncope, syncope, orthopnea, and PND.  All other systems  are reviewed and otherwise negative.  Studies Reviewed: Marland Kitchen    EKG:  EKG is ordered today, personally reviewed, demonstrating ***     CV Studies:  Cardiac Studies & Procedures   ______________________________________________________________________________________________ CARDIAC CATHETERIZATION  CARDIAC CATHETERIZATION 03/15/2022  Narrative   Prox RCA lesion is 100% stenosed.   Prox Cx to Mid Cx lesion is 80% stenosed.   2nd Mrg lesion is 90% stenosed.   Prox LAD lesion is 80% stenosed.   Mid LAD lesion is 80% stenosed.  Severe triple vessel CAD Severe proximal and mid LAD stenosis Severe mid Circumflex. Severe obtuse marginal stenosis The large dominant RCA has chronic total occlusion of the proximal vessel. The distal vessel fills from left to right collaterals.  Recommendations: CT surgery consult for CABG.  Findings Coronary Findings Diagnostic  Dominance: Right  Left Anterior Descending Vessel is large. Prox LAD lesion is 80% stenosed. Mid LAD lesion is 80% stenosed.  Left Circumflex Vessel is large. Prox Cx to Mid Cx lesion is 80% stenosed.  Second Obtuse Marginal Branch Vessel is large in size. 2nd Mrg lesion is 90% stenosed.  Right Coronary Artery Vessel is large. Prox RCA lesion is 100% stenosed. The lesion is chronically occluded.  Third Right Posterolateral Branch Collaterals 3rd RPL filled by collaterals from Dist LAD.  Intervention  No interventions have been documented.     ECHOCARDIOGRAM  ECHOCARDIOGRAM COMPLETE 03/13/2022  Narrative ECHOCARDIOGRAM REPORT    Patient Name:   CATLIN DORIA Date of Exam: 03/13/2022 Medical Rec #:  [id number]     Height:       67.0 in Accession #:    [id number]    Weight:       215.4 lb Date of Birth:  1976/06/13    BSA:          2.087 m Patient Age:    45 years      BP:           133/70 mmHg Patient Gender: F             HR:           58 bpm. Exam Location:  Inpatient  Procedure: 2D Echo, Cardiac Doppler  and Color Doppler  Indications:    Elevated troponin  History:        Patient has no prior history of Echocardiogram examinations. Risk Factors:Diabetes and Hypertension.  Sonographer:    Eduard Roux Referring Phys: Cecilio Asper A HENSEL  IMPRESSIONS   1. Left ventricular ejection fraction, by estimation, is 60 to 65%. The left ventricle has normal function. The left ventricle has no regional wall motion abnormalities. There is moderate concentric left ventricular hypertrophy. Left ventricular diastolic parameters are indeterminate. 2. Right ventricular systolic function is normal. The right ventricular size is normal. There is normal pulmonary artery systolic pressure. 3. The mitral valve is normal in structure. Trivial mitral valve regurgitation. No evidence of mitral stenosis. 4. The aortic valve is tricuspid. Aortic valve regurgitation is not visualized. No aortic stenosis is present. 5. The inferior vena cava is dilated in size with <50% respiratory variability, suggesting right atrial pressure of 15 mmHg.  Comparison(s): No prior Echocardiogram.  Conclusion(s)/Recommendation(s): Otherwise normal echocardiogram, with minor abnormalities described in the report.  FINDINGS Left Ventricle: Left ventricular  ejection fraction, by estimation, is 60 to 65%. The left ventricle has normal function. The left ventricle has no regional wall motion abnormalities. The left ventricular internal cavity size was normal in size. There is moderate concentric left ventricular hypertrophy. Left ventricular diastolic parameters are indeterminate.  Right Ventricle: The right ventricular size is normal. No increase in right ventricular wall thickness. Right ventricular systolic function is normal. There is normal pulmonary artery systolic pressure. The tricuspid regurgitant velocity is 2.27 m/s, and with an assumed right atrial pressure of 15 mmHg, the estimated right ventricular systolic pressure is  35.6 mmHg.  Left Atrium: Left atrial size was normal in size.  Right Atrium: Right atrial size was normal in size.  Pericardium: Trivial pericardial effusion is present.  Mitral Valve: The mitral valve is normal in structure. Trivial mitral valve regurgitation. No evidence of mitral valve stenosis.  Tricuspid Valve: The tricuspid valve is normal in structure. Tricuspid valve regurgitation is trivial. No evidence of tricuspid stenosis.  Aortic Valve: The aortic valve is tricuspid. Aortic valve regurgitation is not visualized. No aortic stenosis is present. Aortic valve peak gradient measures 8.1 mmHg.  Pulmonic Valve: The pulmonic valve was not well visualized. Pulmonic valve regurgitation is not visualized. No evidence of pulmonic stenosis.  Aorta: The aortic root, ascending aorta, aortic arch and descending aorta are all structurally normal, with no evidence of dilitation or obstruction.  Venous: The inferior vena cava is dilated in size with less than 50% respiratory variability, suggesting right atrial pressure of 15 mmHg.  IAS/Shunts: The atrial septum is grossly normal.   LEFT VENTRICLE PLAX 2D LVIDd:         4.40 cm   Diastology LVIDs:         3.00 cm   LV e' medial:    3.55 cm/s LV PW:         1.70 cm   LV E/e' medial:  18.5 LV IVS:        1.60 cm   LV e' lateral:   4.67 cm/s LVOT diam:     2.00 cm   LV E/e' lateral: 14.0 LV SV:         102 LV SV Index:   49 LVOT Area:     3.14 cm   RIGHT VENTRICLE             IVC RV Basal diam:  2.70 cm     IVC diam: 2.30 cm RV S prime:     10.20 cm/s TAPSE (M-mode): 2.3 cm  LEFT ATRIUM             Index        RIGHT ATRIUM           Index LA diam:        3.80 cm 1.82 cm/m   RA Area:     14.90 cm LA Vol (A2C):   52.4 ml 25.11 ml/m  RA Volume:   39.70 ml  19.02 ml/m LA Vol (A4C):   51.0 ml 24.44 ml/m LA Biplane Vol: 53.9 ml 25.83 ml/m AORTIC VALVE                 PULMONIC VALVE AV Area (Vmax): 2.76 cm     PV Vmax:        0.84 m/s AV Vmax:        142.50 cm/s  PV Peak grad:  2.9 mmHg AV Peak Grad:   8.1 mmHg LVOT Vmax:      125.00  cm/s LVOT Vmean:     88.700 cm/s LVOT VTI:       0.326 m  AORTA Ao Root diam: 3.40 cm Ao Asc diam:  3.00 cm  MITRAL VALVE               TRICUSPID VALVE MV Area (PHT): 2.50 cm    TR Peak grad:   20.6 mmHg MV Decel Time: 303 msec    TR Vmax:        227.00 cm/s MV E velocity: 65.60 cm/s MV A velocity: 79.70 cm/s  SHUNTS MV E/A ratio:  0.82        Systemic VTI:  0.33 m Systemic Diam: 2.00 cm  Jodelle Red MD Electronically signed by Jodelle Red MD Signature Date/Time: 03/13/2022/1:47:11 PM    Final   TEE  ECHO INTRAOPERATIVE TEE 03/17/2022  Narrative *INTRAOPERATIVE TRANSESOPHAGEAL REPORT *    Patient Name:   MONTGOMERY ROTHLISBERGER Date of Exam: 03/17/2022 Medical Rec #:  [id number]     Height:       67.0 in Accession #:    [id number]    Weight:       214.9 lb Date of Birth:  12-24-1975    BSA:          2.09 m Patient Age:    45 years      BP:           150/79 mmHg Patient Gender: F             HR:           78 bpm. Exam Location:  Inpatient  Transesophogeal exam was perform intraoperatively during surgical procedure. Patient was closely monitored under general anesthesia during the entirety of examination.  Indications:     coronary artery bypass surgery Performing Phys: Shona Simpson MD Diagnosing Phys: Shona Simpson MD  Complications: No known complications during this procedure. POST-OP IMPRESSIONS s/p CABG x3, L radial harvest _ Left Ventricle: LVEF unchanged, no RWMA's. CO 4.5 L/min. _ Right Ventricle: The right ventricle appears unchanged from pre-bypass. _ Aorta: Stable, No dissection noted after cannula removed. _ Left Atrium: The left atrium appears unchanged from pre-bypass. _ Left Atrial Appendage: The left atrial appendage appears unchanged from pre-bypass. _ Aortic Valve: The aortic valve appears unchanged from pre-bypass. _ Mitral  Valve: The mitral valve appears unchanged from pre-bypass. _ Tricuspid Valve: The tricuspid valve appears unchanged from pre-bypass. _ Pulmonic Valve: The pulmonic valve appears unchanged from pre-bypass. _ Interatrial Septum: The interatrial septum appears unchanged from pre-bypass. _ Interventricular Septum: The interventricular septum appears unchanged from pre-bypass. _ Pericardium: The pericardium appears unchanged from pre-bypass.  PRE-OP FINDINGS Left Ventricle: The left ventricle has normal systolic function, with an ejection fraction of 60-65%. The cavity size was normal. No evidence of left ventricular regional wall motion abnormalities. There is moderate concentric left ventricular hypertrophy.  Right Ventricle: The right ventricle has normal systolic function. The cavity was normal. There is no increase in right ventricular wall thickness. There is no aneurysm seen. PA catheter traversing the right ventricle into the main pulmonary artery.  Left Atrium: Left atrial size was normal in size. No left atrial/left atrial appendage thrombus was detected. The left atrial appendage is well visualized and there is no evidence of thrombus present. Left atrial appendage velocity is normal at greater than 40 cm/s.  Right Atrium: Right atrial size was normal in size. PA catheter traversing the right atrium into the right ventricle.  Interatrial Septum: No  atrial level shunt detected by color flow Doppler. There is no evidence of a patent foramen ovale.  Pericardium: Trivial pericardial effusion is present. There is no pleural effusion.  Mitral Valve: The mitral valve is normal in structure. Mitral valve regurgitation is trivial by color flow Doppler. The MR jet is centrally-directed. There is no evidence of mitral valve vegetation. There is No evidence of mitral stenosis.  Tricuspid Valve: The tricuspid valve was normal in structure. Tricuspid valve regurgitation was not visualized by color  flow Doppler. No evidence of tricuspid stenosis is present. There is no evidence of tricuspid valve vegetation.  Aortic Valve: The aortic valve is tricuspid Aortic valve regurgitation was not visualized by color flow Doppler. There is no stenosis of the aortic valve. There is no evidence of aortic valve vegetation.  Pulmonic Valve: The pulmonic valve was normal in structure. Pulmonic valve regurgitation is not visualized by color flow Doppler.   Aorta: The aortic root and ascending aorta are normal in size and structure. There is evidence of plaque in the ascending aorta; Grade I, measuring 1-17mm in size.  Pulmonary Artery: Theone Murdoch catheter present on the right. The pulmonary artery is of normal size.  Shunts: There is no evidence of an atrial septal defect.   Shona Simpson MD Electronically signed by Shona Simpson MD Signature Date/Time: 03/17/2022/1:36:56 PM    Final    CT SCANS  CT CORONARY MORPH W/CTA COR W/SCORE 03/14/2022  Addendum 03/15/2022 10:13 AM ADDENDUM REPORT: 03/15/2022 10:11  EXAM: OVER-READ INTERPRETATION  CT CHEST  The following report is an over-read performed by radiologist Dr. Jacob Moores [facility] on 03/15/2022. This over-read does not include interpretation of cardiac or coronary anatomy or pathology. The Cardiac/Coronary CTA interpretation by the cardiologist is attached.  COMPARISON:  None.  FINDINGS: Vascular: Normal heart size. No pericardial effusion. Normal caliber thoracic aorta with mild noncalcified plaque. No suspicious filling defects of the central pulmonary arteries.  Mediastinum/Nodes: Esophagus is unremarkable. No pathologically enlarged lymph nodes seen in the chest.  Lungs/Pleura: Central airways are patent. Bibasilar atelectasis. No consolidation, pleural effusion or pneumothorax.  Upper Abdomen: No acute abnormality.  Musculoskeletal: No chest wall mass or suspicious bone  lesions identified.  IMPRESSION: 1. No acute extracardiac abnormality. 2.  Aortic Atherosclerosis (ICD10-I70.0).   Electronically Signed By: Allegra Lai M.D. On: 03/15/2022 10:11  Narrative CLINICAL DATA:  This is a 48 year old female with chest pain.  EXAM: Cardiac/Coronary  CTA  TECHNIQUE: The patient was scanned on a [employer].  FINDINGS: A 100 kV prospective scan was triggered in the descending thoracic aorta at 111 HU's. Axial non-contrast 3 mm slices were carried out through the heart. The data set was analyzed on a dedicated work station and scored using the Agatson method. Gantry rotation speed was 250 msecs and collimation was .6 mm. No beta blockade and 0.8 mg of sl NTG was given. The 3D data set was reconstructed in 5% intervals of the 67-82 % of the R-R cycle. Diastolic phases were analyzed on a dedicated work station using MPR, MIP and VRT modes. The patient received 80 cc of contrast.  Aorta: Normal size.  No calcifications.  No dissection.  Aortic Valve:  Trileaflet.  No calcifications.  Coronary Arteries:  Normal coronary origin.  Right dominance.  RCA is a large dominant artery that gives rise to PDA and PLA. There is proximal RCA appears to have a severe calcified plaque in the proximal RCA. The proximal to  mid RCA is not well visualized and suspect that this portion of the vessel is totally occluded.  Left main is a large artery that gives rise to LAD and LCX arteries.  LAD is a large vessel. The proximal LAD starts with a moderate (50-69%) calcified lesion which terminates into a severe lesion. The mid LAD with moderate calcified plaque. The mid- distal LAD with moderate plaque.  LCX is a non-dominant artery that gives rise to one large OM1 branch. There is a mild calcified plaque at proximal LCX. The proximal to mid LCX with a focal moderate calcified plaque. The distal LCX with no plaques.  Coronary Calcium Score:  Left  main: 0  Left anterior descending artery: 514  Left circumflex artery: 79.7  Right coronary artery: 116  Total: 709  Percentile: 99  Other findings:  Normal pulmonary vein drainage into the left atrium.  Normal left atrial appendage without a thrombus.  Normal size of the pulmonary artery.  IMPRESSION: 1. Coronary calcium score of 709. This was 59 percentile for age and sex matched control.  2. Normal coronary origin with right dominance.  3. 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 Consider cardiac catheterization or viability assessment. Consider symptom-guided anti-ischemic pharmacotherapy as well as risk factor modification per guideline directed care. Will also send for FFRct.  The noncardiac portion of this study will be interpreted in separate report by the radiologist.  Electronically Signed: By: Thomasene Ripple D.O. On: 03/14/2022 17:52   CT SCANS  CT CORONARY FRACTIONAL FLOW RESERVE DATA PREP 03/14/2022  Narrative EXAM: CT FFR ANALYSIS  CLINICAL DATA:  Coronary artery disease (CAD) (Ped 0-17y)  FINDINGS: FFRct analysis was performed on the original cardiac CT angiogram dataset. Diagrammatic representation of the FFRct analysis is provided in a separate PDF document in PACS. This dictation was created using the PDF document and an interactive 3D model of the results. 3D model is not available in the EMR/PACS. Normal FFR range is >0.80. Indeterminate (grey) zone is 0.76-0.80.  1. Left Main: FFR = 1.00  2. LAD: Proximal FFR = 0.74, mid FFR = 0.50, distal FFR = 0.50 3. LCX: Proximal FFR = 0.92, distal FFR = 0.65 4. RCA: Proximal FFR = Total occlusion in the proximal RCA.  IMPRESSION: 1. CT FFR analysis showed flow limiting lesions in the LAD and LCX with total occlusion in the proximal RCA.  RECOMMENDATIONS: Recommend cardiac catheterization.   Electronically Signed By: Thomasene Ripple D.O. On: 03/14/2022 17:56      ______________________________________________________________________________________________        Current Reported Medications:.    No outpatient medications have been marked as taking for the 01/10/24 encounter (Appointment) with Rip Harbour, NP.    Physical Exam:    VS:  There were no vitals taken for this visit.   Wt Readings from Last 3 Encounters:  12/13/23 232 lb (105.2 kg)  11/25/23 235 lb 14.3 oz (107 kg)  11/15/23 238 lb (108 kg)    GEN: Well nourished, well developed in no acute distress NECK: No JVD; No carotid bruits CARDIAC: ***RRR, no murmurs, rubs, gallops RESPIRATORY:  Clear to auscultation without rales, wheezing or rhonchi  ABDOMEN: Soft, non-tender, non-distended EXTREMITIES:  No edema; No acute deformity   Asessement and Plan:.     ***     Disposition: F/u with ***  Signed, Rip Harbour, NP

## 2024-01-10 ENCOUNTER — Ambulatory Visit: Payer: MEDICAID | Admitting: Cardiology

## 2024-01-10 DIAGNOSIS — Z720 Tobacco use: Secondary | ICD-10-CM

## 2024-01-10 DIAGNOSIS — E785 Hyperlipidemia, unspecified: Secondary | ICD-10-CM

## 2024-01-10 DIAGNOSIS — I10 Essential (primary) hypertension: Secondary | ICD-10-CM

## 2024-01-10 DIAGNOSIS — E039 Hypothyroidism, unspecified: Secondary | ICD-10-CM

## 2024-01-10 DIAGNOSIS — I2510 Atherosclerotic heart disease of native coronary artery without angina pectoris: Secondary | ICD-10-CM

## 2024-01-10 DIAGNOSIS — E118 Type 2 diabetes mellitus with unspecified complications: Secondary | ICD-10-CM

## 2024-01-18 ENCOUNTER — Encounter (HOSPITAL_BASED_OUTPATIENT_CLINIC_OR_DEPARTMENT_OTHER): Payer: Self-pay

## 2024-01-20 NOTE — Progress Notes (Unsigned)
 Cardiology Clinic Note   Patient Name: Tanya Nolan Date of Encounter: 01/20/2024  Primary Care Provider:  Doreene Eland, MD Primary Cardiologist:  Bryan Lemma, MD  Patient Profile    Tanya Nolan 48 year old female presents the clinic today for follow-up evaluation of her coronary artery disease and hypertension.  Past Medical History    Past Medical History:  Diagnosis Date   Abdominal pain 03/26/2009   Qualifier: Diagnosis of  By: Jimmey Ralph MD, [facility]     Anxiety    BACK STRAIN, LUMBAR 03/26/2009   Qualifier: Diagnosis of  By: Jimmey Ralph MD, Victorino Dike     Chalazion 10/01/2007   Qualifier: Diagnosis of  By: Karn Pickler MD, Jessica     Chronic back pain    tx with ibuprofen   Diabetes mellitus    Hypercholesteremia    Hyperlipidemia    no meds - tx with diet   Hypertension    Hypothyroidism    Incontinence in female 10/05/2020   Ingrown toenail 12/25/2014   Ischemic heart disease due to coronary artery obstruction (HCC) 03/16/2022   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   C&B WNL  NEG PAPS after   MDD (major depressive disorder), recurrent severe, 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   Nipple discharge 10/13/2013   Nodule of skin of left foot 04/15/2021   Non compliance w medication regimen 10/13/2013   Non-ST elevation (NSTEMI) myocardial infarction [facility])    Simple endometrial hyperplasia 09/2006   BENIGN SECRETORY 02/2007   Thyroid dysfunction    Past Surgical History:  Procedure Laterality Date   CORONARY ARTERY BYPASS GRAFT N/A 03/17/2022   Procedure: CORONARY ARTERY BYPASS GRAFTING X THREE, USING LEFT INTERNAL MAMMARY ARTERY, LEFT RADIAL ARTERY, RIGHT GREATER SAPHENOUS VEIN HARVESTED ENDOSCOPICALLY;  Surgeon: Alleen Borne, MD;  Location: [facility] OR;  Service: Open Heart Surgery;  Laterality: N/A;   DISC DECOMPRESSION  2008   HYSTEROSCOPY X 2  2007 / 2012   ENDOMETRIAL POLYPS REMOVED   LEFT HEART CATH AND CORONARY ANGIOGRAPHY N/A 03/15/2022    Procedure: LEFT HEART CATH AND CORONARY ANGIOGRAPHY;  Surgeon: Kathleene Hazel, MD;  Location: [facility] INVASIVE CV LAB;  Service: Cardiovascular;  Laterality: N/A;   RADIAL ARTERY HARVEST Left 03/17/2022   Procedure: RADIAL ARTERY HARVEST;  Surgeon: Alleen Borne, MD;  Location: [facility] OR;  Service: Open Heart Surgery;  Laterality: Left;   TEE WITHOUT CARDIOVERSION N/A 03/17/2022   Procedure: TRANSESOPHAGEAL ECHOCARDIOGRAM (TEE);  Surgeon: Alleen Borne, MD;  Location: [facility] OR;  Service: Open Heart Surgery;  Laterality: N/A;    Allergies  Allergies  Allergen Reactions   Metformin Diarrhea    History of Present Illness    Tanya Nolan has a PMH of HTN, coronary artery disease, hypothyroidism, type 2 diabetes, chronic back pain, tobacco abuse, depression, Bell's palsy, and NSTEMI.  She was seen in the emergency department 03/13/2022.  She complained of seizure-like activity which was associated with urinary incontinence and tongue biting.  She also noted chest pain during intercourse.  Her cardiac troponins were elevated.  Her chest x-ray was unremarkable.  Her EKG was overall normal.  MRI of her head and EEG were negative.  She did report 1 month history of chest pain on exertion prior to her ED visit.  She described chest pain often which was relieved by rest.  She was hospitalized in the setting of NSTEMI.  Cardiology was consulted.  Her echocardiogram showed normal LVEF, moderate concentric LVH and intermediate  diastolic parameters.  Coronary CT angiogram showed a coronary calcium score of 709, CT FFR showed flow-limiting lesions in her LAD and left circumflex with total occlusion in her proximal RCA.  She underwent cardiac catheterization 5/23 which showed proximal RCA 100%, proximal-mid circumflex, OM2 90%, proximal LAD 80%, mid LAD 80%.  CT surgery was consulted and she underwent CABG x 3 on 03/17/2022.  She was referred to the lipid clinic and was ultimately started on Repatha.  She presented  to the ED 06/29/2022 with complaints of left facial droop and chest pain.  Her cardiac troponins were negative.  Her D-dimer was mildly elevated.  Her CT angio chest was negative for acute findings.  Her EKG was unremarkable.  Her blood pressure was elevated.  She was treated for Bell's palsy and discharged home in stable condition.  She was seen in follow-up by Bernadene Person, NP on 08/14/2022.  During that time she remained stable from a cardiac standpoint.  Her blood pressure was elevated.  She had been started on amlodipine.  She denied anginal type symptoms.  Her blood pressure was well-controlled.  Follow-up was planned for 4 to 6 months.  She presents to the clinic today for follow-up evaluation and states***.  *** denies chest pain, shortness of breath, lower extremity edema, fatigue, palpitations, melena, hematuria, hemoptysis, diaphoresis, weakness, presyncope, syncope, orthopnea, and PND.  Coronary artery disease-status post CABG x 3.  Echocardiogram 03/17/2022 showed normal LVEF, moderate concentric LVH, intermediate diastolic parameters. Continue aspirin, Plavix, amlodipine, metoprolol, atorvastatin, ezetimibe, Repatha Heart healthy low-sodium diet  Essential hypertension-BP today***. Maintain blood pressure log Continue amlodipine, metoprolol Heart healthy low-sodium diet  Hyperlipidemia-LDL***. Continue Repatha, Lipitor, ezetimibe High-fiber diet Repeat fasting lipids and LFTs  Hypothyroidism-TSH-*** Continue Synthroid Follows with PCP  Type 2 diabetes-glucose*** Carb modified diet Follows with PCP  Preoperative cardiac evaluation-colonoscopy, [facility]  What is your office phone and fax number? Phone- [phone] Fax- [phone]     Primary Cardiologist: Bryan Lemma, MD  Chart reviewed as part of pre-operative protocol coverage. Given past medical history and time since last visit, based on ACC/AHA guidelines, Tanya Nolan would be at acceptable risk  for the planned procedure without further cardiovascular testing.   Patient was advised that if she develops new symptoms prior to surgery to contact our office to arrange a follow-up appointment.  He verbalized understanding.  Her Plavix may be held for 5 days prior to her procedure.  Please resume as soon as hemostasis is achieved.  I will route this recommendation to the requesting party via Epic fax function and remove from pre-op pool.       Disposition: Follow-up with Dr. Herbie Baltimore or me in 9-12 months.   Home Medications    Prior to Admission medications   Medication Sig Start Date End Date Taking? Authorizing Provider  Accu-Chek FastClix Lancets MISC Use to check CBG TID 07/22/19   Doreene Eland, MD  acetaminophen (TYLENOL) 500 MG tablet Take 1,000 mg by mouth every 6 (six) hours as needed for moderate pain (pain score 4-6) or headache.    [provider]  albuterol (PROAIR HFA) 108 (90 Base) MCG/ACT inhaler Inhale 1-2 puffs into the lungs every 6 (six) hours as needed for wheezing or shortness of breath. 05/20/21   Doreene Eland, MD  ARIPiprazole (ABILIFY) 5 MG tablet Take 1 tablet (5 mg total) by mouth daily. 09/11/23   McDiarmid, Leighton Roach, MD  artificial tears (LACRILUBE) OINT ophthalmic ointment Place into the left eye  at bedtime as needed for dry eyes. 06/29/22   Terrilee Files, MD  aspirin EC 81 MG tablet Take 1 tablet (81 mg total) by mouth daily. Swallow whole. 06/01/23   Doreene Eland, MD  atorvastatin (LIPITOR) 80 MG tablet Take 1 tablet (80 mg total) by mouth daily. 06/26/23   McDiarmid, Leighton Roach, MD  Blood Glucose Monitoring Suppl (ACCU-CHEK GUIDE) w/Device KIT 1 applicator by Does not apply route 3 (three) times daily. Use to check CBG TID 07/22/19   Doreene Eland, MD  cetirizine (ZYRTEC) 10 MG tablet Take 1 tablet (10 mg total) by mouth daily as needed for allergies. 03/21/22   Ardelle Balls, PA-C  clopidogrel (PLAVIX) 75 MG tablet Take 1 tablet  (75 mg total) by mouth daily. 09/11/23   McDiarmid, Leighton Roach, MD  cyclobenzaprine (FLEXERIL) 5 MG tablet Take 1 tablet (5 mg total) by mouth at bedtime as needed for muscle spasms. 11/15/23   Doreene Eland, MD  empagliflozin (JARDIANCE) 10 MG TABS tablet Take 1 tablet (10 mg total) by mouth daily. 09/25/23   McDiarmid, Leighton Roach, MD  Evolocumab (REPATHA SURECLICK) 140 MG/ML SOAJ INJECT 140 MG INTO THE SKIN EVERY 14 DAYS 06/25/23   Marykay Lex, MD  ezetimibe (ZETIA) 10 MG tablet Take 1 tablet (10 mg total) by mouth daily. 09/25/23   McDiarmid, Leighton Roach, MD  glucose blood (ACCU-CHEK GUIDE) test strip USE TO CHECK GBG 3 TIMES A DAY 04/13/21   Simmons-Robinson, Tawanna Cooler, MD  levothyroxine (SYNTHROID) 125 MCG tablet Take 1 tablet (125 mcg total) by mouth daily before breakfast. Reported on 02/23/2016 06/01/23   Doreene Eland, MD  metoprolol tartrate (LOPRESSOR) 25 MG tablet Take 0.5 tablets (12.5 mg total) by mouth 2 (two) times daily. 09/11/23   McDiarmid, Leighton Roach, MD  Semaglutide, 1 MG/DOSE, 4 MG/3ML SOPN Inject 1 mg into the skin once a week. 09/11/23   McDiarmid, Leighton Roach, MD  sertraline (ZOLOFT) 50 MG tablet Take 1 tablet (50 mg total) by mouth daily. For depression 09/25/23   McDiarmid, Leighton Roach, MD  sodium chloride (OCEAN) 0.65 % SOLN nasal spray Place 1 spray into both nostrils as needed for congestion. 11/25/23   Elayne Snare K, DO  valsartan-hydrochlorothiazide (DIOVAN-HCT) 80-12.5 MG tablet Take 1 tablet by mouth daily. 09/11/23   McDiarmid, Leighton Roach, MD  insulin lispro protamine-insulin lispro (HUMALOG 75/25) (75-25) 100 UNIT/ML SUSP Inject 40-50 Units into the skin 2 (two) times daily with a meal. Patient takes 40 units in the morning and 50 units at night.  Verified it is Humalog 75/25.  04/18/12  [provider]    Family History    Family History  Problem Relation Age of Onset   Lupus Mother    Hypertension Mother    Heart disease Father    Gout Father    Lupus Father     Hypertension Sister    Breast cancer Neg Hx    She indicated that the status of her mother is unknown. She indicated that the status of her father is unknown. She indicated that her sister is deceased. She indicated that the status of her neg hx is unknown.  Social History    Social History   Socioeconomic History   Marital status: Widowed    Spouse name: Not on file   Number of children: 0   Years of education: Not on file   Highest education level: Not on file  Occupational History   Occupation: CNA  Tobacco Use   Smoking status: Every Day    Current packs/day: 1.00    Average packs/day: 1 pack/day for 8.0 years (8.0 ttl pk-yrs)    Types: Cigarettes    Passive exposure: Current   Smokeless tobacco: Never  Vaping Use   Vaping status: Never Used  Substance and Sexual Activity   Alcohol use: Yes    Alcohol/week: 0.0 standard drinks of alcohol    Comment: socially - wine/liquor--Rare   Drug use: Yes    Frequency: 7.0 times per week    Types: Marijuana   Sexual activity: Yes    Birth control/protection: None    Comment: 1st intercourse 48 yo-More than 5 partners  Other Topics Concern   Not on file  Social History Narrative   Not on file   Social Drivers of Health   Financial Resource Strain: Not on file  Food Insecurity: Not on file  Transportation Needs: Not on file  Physical Activity: Not on file  Stress: Not on file  Social Connections: Not on file  Intimate Partner Violence: Not on file     Review of Systems    General:  No chills, fever, night sweats or weight changes.  Cardiovascular:  No chest pain, dyspnea on exertion, edema, orthopnea, palpitations, paroxysmal nocturnal dyspnea. Dermatological: No rash, lesions/masses Respiratory: No cough, dyspnea Urologic: No hematuria, dysuria Abdominal:   No nausea, vomiting, diarrhea, bright red blood per rectum, melena, or hematemesis Neurologic:  No visual changes, wkns, changes in mental status. All other  systems reviewed and are otherwise negative except as noted above.  Physical Exam    VS:  There were no vitals taken for this visit. , BMI There is no height or weight on file to calculate BMI. GEN: Well nourished, well developed, in no acute distress. HEENT: normal. Neck: Supple, no JVD, carotid bruits, or masses. Cardiac: RRR, no murmurs, rubs, or gallops. No clubbing, cyanosis, edema.  Radials/DP/PT 2+ and equal bilaterally.  Respiratory:  Respirations regular and unlabored, clear to auscultation bilaterally. GI: Soft, nontender, nondistended, BS + x 4. MS: no deformity or atrophy. Skin: warm and dry, no rash. Neuro:  Strength and sensation are intact. Psych: Normal affect.  Accessory Clinical Findings    Recent Labs: 11/15/2023: BUN 9; Creatinine, Ser 0.86; Potassium 4.2; Sodium 143; TSH 3.340   Recent Lipid Panel    Component Value Date/Time   CHOL 173 11/15/2023 1416   TRIG 236 (H) 11/15/2023 1416   HDL 30 (L) 11/15/2023 1416   CHOLHDL 5.8 (H) 11/15/2023 1416   CHOLHDL 4.7 03/14/2022 0552   VLDL 20 03/14/2022 0552   LDLCALC 102 (H) 11/15/2023 1416   LDLDIRECT 65 01/05/2021 1457    No BP recorded.  {Refresh Note OR Click here to enter BP  :1}***    ECG personally reviewed by me today- ***    Echocardiogram 03/13/2022  IMPRESSIONS     1. Left ventricular ejection fraction, by estimation, is 60 to 65%. The  left ventricle has normal function. The left ventricle has no regional  wall motion abnormalities. There is moderate concentric left ventricular  hypertrophy. Left ventricular  diastolic parameters are indeterminate.   2. Right ventricular systolic function is normal. The right ventricular  size is normal. There is normal pulmonary artery systolic pressure.   3. The mitral valve is normal in structure. Trivial mitral valve  regurgitation. No evidence of mitral stenosis.   4. The aortic valve is tricuspid. Aortic valve regurgitation is not  visualized. No  aortic  stenosis is present.   5. The inferior vena cava is dilated in size with <50% respiratory  variability, suggesting right atrial pressure of 15 mmHg.   Comparison(s): No prior Echocardiogram.   Conclusion(s)/Recommendation(s): Otherwise normal echocardiogram, with  minor abnormalities described in the report.   FINDINGS   Left Ventricle: Left ventricular ejection fraction, by estimation, is 60  to 65%. The left ventricle has normal function. The left ventricle has no  regional wall motion abnormalities. The left ventricular internal cavity  size was normal in size. There is   moderate concentric left ventricular hypertrophy. Left ventricular  diastolic parameters are indeterminate.   Right Ventricle: The right ventricular size is normal. No increase in  right ventricular wall thickness. Right ventricular systolic function is  normal. There is normal pulmonary artery systolic pressure. The tricuspid  regurgitant velocity is 2.27 m/s, and   with an assumed right atrial pressure of 15 mmHg, the estimated right  ventricular systolic pressure is 35.6 mmHg.   Left Atrium: Left atrial size was normal in size.   Right Atrium: Right atrial size was normal in size.   Pericardium: Trivial pericardial effusion is present.   Mitral Valve: The mitral valve is normal in structure. Trivial mitral  valve regurgitation. No evidence of mitral valve stenosis.   Tricuspid Valve: The tricuspid valve is normal in structure. Tricuspid  valve regurgitation is trivial. No evidence of tricuspid stenosis.   Aortic Valve: The aortic valve is tricuspid. Aortic valve regurgitation is  not visualized. No aortic stenosis is present. Aortic valve peak gradient  measures 8.1 mmHg.   Pulmonic Valve: The pulmonic valve was not well visualized. Pulmonic valve  regurgitation is not visualized. No evidence of pulmonic stenosis.   Aorta: The aortic root, ascending aorta, aortic arch and descending aorta  are all  structurally normal, with no evidence of dilitation or  obstruction.   Venous: The inferior vena cava is dilated in size with less than 50%  respiratory variability, suggesting right atrial pressure of 15 mmHg.   IAS/Shunts: The atrial septum is grossly normal.    Cardiac catheterization 03/15/2022    Prox RCA lesion is 100% stenosed.   Prox Cx to Mid Cx lesion is 80% stenosed.   2nd Mrg lesion is 90% stenosed.   Prox LAD lesion is 80% stenosed.   Mid LAD lesion is 80% stenosed.   Severe triple vessel CAD Severe proximal and mid LAD stenosis Severe mid Circumflex. Severe obtuse marginal stenosis The large dominant RCA has chronic total occlusion of the proximal vessel. The distal vessel fills from left to right collaterals.    Recommendations: CT surgery consult for CABG.  Diagnostic Dominance: Right  Intervention   Assessment & Plan   1.  ***   Thomasene Ripple. Tanya Crothers NP-C     01/20/2024, 3:47 PM [facility] [address] Office [phone] Fax [phone]    I spent***minutes examining this patient, reviewing medications, and using patient centered shared decision making involving their cardiac care.   I spent  20 minutes reviewing past medical history,  medications, and prior cardiac tests.

## 2024-01-21 NOTE — Telephone Encounter (Signed)
 Pt missed appt 2/27. She has another appt scheduled with Edd Fabian, NP on 3/11. Will fwd note to Mr. Cleaver so that he can address surgical clearance at that visit and send notes to the requesting provider.  Will also forward to requesting provider as FYI. Note will be removed from preop pool. Tereso Newcomer, PA-C    01/21/2024 4:24 PM

## 2024-01-22 ENCOUNTER — Encounter: Payer: Self-pay | Admitting: General Practice

## 2024-01-22 ENCOUNTER — Ambulatory Visit: Payer: MEDICAID | Attending: General Practice | Admitting: General Practice

## 2024-01-22 DIAGNOSIS — Z01810 Encounter for preprocedural cardiovascular examination: Secondary | ICD-10-CM

## 2024-01-22 DIAGNOSIS — E118 Type 2 diabetes mellitus with unspecified complications: Secondary | ICD-10-CM

## 2024-01-22 DIAGNOSIS — E785 Hyperlipidemia, unspecified: Secondary | ICD-10-CM

## 2024-01-22 DIAGNOSIS — I10 Essential (primary) hypertension: Secondary | ICD-10-CM

## 2024-01-22 DIAGNOSIS — E039 Hypothyroidism, unspecified: Secondary | ICD-10-CM

## 2024-01-22 DIAGNOSIS — I2510 Atherosclerotic heart disease of native coronary artery without angina pectoris: Secondary | ICD-10-CM

## 2024-01-22 MED ORDER — METOPROLOL SUCCINATE ER 25 MG PO TB24: 25.0000 mg | ORAL_TABLET | Freq: Every day | ORAL | 6 refills | Status: DC

## 2024-01-22 NOTE — Telephone Encounter (Signed)
 Primary Cardiologist: Bryan Lemma, MD   Chart reviewed as part of pre-operative protocol coverage. Given past medical history and time since last visit, based on ACC/AHA guidelines, Tanya Nolan would be at acceptable risk for the planned procedure without further cardiovascular testing.    Patient was advised that if she develops new symptoms prior to surgery to contact our office to arrange a follow-up appointment.  He verbalized understanding.   Her Plavix may be held for 5 days prior to her procedure.  Please resume as soon as hemostasis is achieved.   I will route this recommendation to the requesting party via Epic fax function and remove from pre-op pool.

## 2024-01-22 NOTE — Patient Instructions (Signed)
 Medication Instructions:  STOP METOPROLOL TARTRATE START METOPROLOL SUCCINATE 25MG  DAILY *If you need a refill on your cardiac medications before your next appointment, please call your pharmacy*  Lab Work: NONE  Testing/Procedures: NONE  Follow-Up: At [facility], you and your health needs are our priority.  As part of our continuing mission to provide you with exceptional heart care, we have created designated Provider Care Teams.  These Care Teams include your primary Cardiologist (physician) and Advanced Practice Providers (APPs -  Physician Assistants and Nurse Practitioners) who all work together to provide you with the care you need, when you need it.  Your next appointment:   6 month(s)  Provider:   Bryan Lemma, MD  or Edd Fabian, FNP        Other Instructions OK FOR COLONOSCOPY SEE ATTACHED STRESS TIPS PURCHASE AND WEAR COMPRESSION STOCKINGS-ATTACHED  Mindfulness-Based Stress Reduction Mindfulness-based stress reduction (MBSR) is a program that helps people learn to practice mindfulness. Mindfulness is the practice of consciously paying attention to the present moment. MBSR focuses on developing self-awareness, which lets you respond to life stress without judgment or negative feelings. It can be learned and practiced through techniques such as education, breathing exercises, meditation, and yoga. MBSR includes several mindfulness techniques in one program. MBSR works best when you understand the treatment, are willing to try new things, and can commit to spending time practicing what you learn. MBSR training may include learning about: How your feelings, thoughts, and reactions affect your body. New ways to respond to things that cause negative thoughts to start (triggers). How to notice your thoughts and let go of them. Practicing awareness of everyday things that you normally do without thinking. The techniques and goals of different types of  meditation. What are the benefits of MBSR? MBSR can have many benefits, which include helping you to: Develop self-awareness. This means knowing and understanding yourself. Learn skills and attitudes that help you to take part in your own health care. Learn new ways to care for yourself. Be more accepting about how things are, and let things go. Be less judgmental and approach things with an open mind. Be patient with yourself and trust yourself more. MBSR has also been shown to: Reduce negative emotions, such as sadness, overwhelm, and worry. Improve memory and focus. Change how you sense and react to pain. Boost your body's ability to fight infections. Help you connect better with other people. Improve your sense of well-being. How to practice mindfulness To do a basic awareness exercise: Find a comfortable place to sit. Pay attention to the present moment. Notice your thoughts, feelings, and surroundings just as they are. Avoid judging yourself, your feelings, or your surroundings. Make note of any judgment that comes up and let it go. Your mind may wander, and that is okay. Make note of when your thoughts drift, and return your attention to the present moment. To do basic mindfulness meditation: Find a comfortable place to sit. This may include a stable chair or a firm floor cushion. Sit upright with your back straight. Let your arms fall next to your sides, with your hands resting on your legs. If you are sitting in a chair, rest your feet flat on the floor. If you are sitting on a cushion, cross your legs in front of you. Keep your head in a neutral position with your chin dropped slightly. Relax your jaw and rest the tip of your tongue on the roof of your mouth. Drop your gaze  to the floor or close your eyes. Breathe normally and pay attention to your breath. Feel the air moving in and out of your nose. Feel your belly expanding and relaxing with each breath. Your mind may wander,  and that is okay. Make note of when your thoughts drift, and return your attention to your breath. Avoid judging yourself, your feelings, or your surroundings. Make note of any judgment or feelings that come up, let them go, and bring your attention back to your breath. When you are ready, lift your gaze or open your eyes. Pay attention to how your body feels after the meditation. Follow these instructions at home:  Find a local in-person or online MBSR program. Set aside some time regularly for mindfulness practice. Practice every day if you can. Even 10 minutes of practice is helpful. Find a mindfulness practice that works best for you. This may include one or more of the following: Meditation. This involves focusing your mind on a certain thought or activity. Breathing awareness exercises. These help you to stay present by focusing on your breath. Body scan. For this practice, you lie down and pay attention to each part of your body from head to toe. You can identify tension and soreness and consciously relax parts of your body. Yoga. Yoga involves stretching and breathing, and it can improve your ability to move and be flexible. It can also help you to test your body's limits, which can help you release stress. Mindful eating. This way of eating involves focusing on the taste, texture, color, and smell of each bite of food. This slows down eating and helps you feel full sooner. For this reason, it can be an important part of a weight loss plan. Find a podcast or recording that provides guidance for breathing awareness, body scan, or meditation exercises. You can listen to these any time when you have a free moment to rest without distractions. Follow your treatment plan as told by your health care provider. This may include taking regular medicines and making changes to your diet or lifestyle as recommended. Where to find more information You can find more information about MBSR from: Your  health care provider. Community-based meditation centers or programs. Programs offered near you. Summary Mindfulness-based stress reduction (MBSR) is a program that teaches you how to consciously pay attention to the present moment. It is used to help you deal better with daily stress, feelings, and pain. MBSR focuses on developing self-awareness, which allows you to respond to life stress without judgment or negative feelings. MBSR programs may involve learning different mindfulness practices, such as breathing exercises, meditation, yoga, body scan, or mindful eating. Find a mindfulness practice that works best for you, and set aside time for it on a regular basis.  [employer]  [employer] worker for [employer] Mailing Address:  [address];   [address]  [place], [place] [address]  Tel [phone] Fx [phone]     High Quality Legwear for Today's Active Lifestyles Maximum Compression at the ankle. Compression lessens gradually up the leg.   We manufacture a wide range of compression hosiery for men and women in  different styles, constructions and levels of support.  How Compression Hosiery Works Regulatory affairs officer, [employer]. compression hosiery works by applying graduated pressure to the  muscles and veins in the legs.  When the calf muscle contracts such as during walking  the compression hosiery will "give" and then return to  its original position. By doing so  the hosiery is assists your body's circulatory wellness.  The result is increased leg health and vitality.   Maximum Compression at the ankle Compression lessens gradually up the leg  We Offer: Sheer & Opaque Stockings       COLORS:  Nude, black, white and misc. prints Below Knee Thigh High Pantyhose  High Quality Legwear for Today's Active Lifestyles We manufacture a wide range of compression hosiery for men and women in different styles, construction sand levels of  support.  Socks:                     Sheer & Opaque   Compression Levels Include:                                  Stockings Men's               Below Knee                8-15 mmHg   Women's         Thigh High                 15-20 mmHg  Unisex             Pantyhose                 20-30 mmHg                                                                      30-40 mmHg  4 Simple Ways to Order     Compression  8-15 mmHg >>15-20 mmHg<<   20-30 mmHg 30-40 mmHg   WOMEN'S MEN'S  Shoe Size Sock Size Shoe Size Sock Size  4 - 5 Small 7.5 and Under Small  5.5 - 7.5 Medium 8 - 10 Medium  8 - 10 Large 10.5 - 12 Large  10.5 and Over X-Large 12.5 and Over X-Large   Knee High Size Chart  Length from CALF MEASUREMENT  floor to bend   in knee. 11" 12" 13" 14" 15" 16" 17" 18" 19" 20" 21" 22"  14" S S S S M M L L L XL XL XL  15" S S S M M L L L XL XL XXL XXL  16" S S M M M L L L XL Randa Lynn XXL  17" S M M M M L L XL XL Randa Lynn XXL  18" M M M M L L L XL XL XXL XXL XXL  19" M M M M L L XL XL XL XXL XXL XXL   Thigh High Circumference Sizing Chart                 S M L XL XXL  ANKLE 6.5" - 8" 8" - 9.5" 9.5" - 11" 11" - 12.5" 12.5" - 14"  CALF 10.5" - 14.5" 11.5" - 15.5" 12.5" - 17" 13.5" - 17.5" 14.5" - 19.5"  THIGH 15.5" - 22" 17.5" - 24" 19.5" - 26" 22" - 28" 26" - 32"  HIP UP TO 40" UP TO 44" UP TO  48' UP TO 52" UP TO 56"   Pantyhose Size Chart  Height Petite Medium Tall X-Tall [facility] +   Weight Weight Weight Weight Weight Weight  4'11" 95-130 135      5'0 95-125 130-145   170-185   5'1" 90-120 125-155 160-165  170-195   5'2" 90-115 120-145 150-165  170-195   5'3" 90-110 115-140 145-165  170-200 200-225  5'4" 100-105 110-135 140-160 165 170-200 200-225  5'5" 100 105-130 135-160 165 170-200 200-225  5'6"  110-125 130-155 160-165 170-200 200-225  5'7"  110-120 125-150 155-165 170-200 195-225  5'8"   120-145 150-165 170-200 190-225  5'9"   125-140 145-170 175-190 185-220  5'10"    125-135 140-185  185-215  5'11"   130-135 140-185  190-210

## 2024-01-23 NOTE — Telephone Encounter (Signed)
 Thanks John, cards has cleared pt for [facility] procedure  JMP

## 2024-01-29 ENCOUNTER — Encounter: Payer: Self-pay | Admitting: Certified Registered Nurse Anesthetist

## 2024-02-01 ENCOUNTER — Ambulatory Visit (AMBULATORY_SURGERY_CENTER): Payer: MEDICAID | Admitting: Gastroenterology

## 2024-02-01 ENCOUNTER — Encounter: Payer: Self-pay | Admitting: Gastroenterology

## 2024-02-01 VITALS — BP 159/84 | HR 56 | Temp 97.2°F | Resp 17 | Ht 67.0 in | Wt 232.0 lb

## 2024-02-01 DIAGNOSIS — K5730 Diverticulosis of large intestine without perforation or abscess without bleeding: Secondary | ICD-10-CM | POA: Diagnosis not present

## 2024-02-01 DIAGNOSIS — D123 Benign neoplasm of transverse colon: Secondary | ICD-10-CM

## 2024-02-01 DIAGNOSIS — K621 Rectal polyp: Secondary | ICD-10-CM | POA: Diagnosis not present

## 2024-02-01 DIAGNOSIS — K635 Polyp of colon: Secondary | ICD-10-CM

## 2024-02-01 DIAGNOSIS — K648 Other hemorrhoids: Secondary | ICD-10-CM | POA: Diagnosis not present

## 2024-02-01 DIAGNOSIS — K644 Residual hemorrhoidal skin tags: Secondary | ICD-10-CM

## 2024-02-01 DIAGNOSIS — D124 Benign neoplasm of descending colon: Secondary | ICD-10-CM

## 2024-02-01 DIAGNOSIS — D128 Benign neoplasm of rectum: Secondary | ICD-10-CM

## 2024-02-01 DIAGNOSIS — K625 Hemorrhage of anus and rectum: Secondary | ICD-10-CM

## 2024-02-01 MED ORDER — SODIUM CHLORIDE 0.9 % IV SOLN: 500.0000 mL | INTRAVENOUS | Status: DC

## 2024-02-01 NOTE — Progress Notes (Signed)
 Pt's states no medical or surgical changes since previsit or office visit.

## 2024-02-01 NOTE — Patient Instructions (Signed)

## 2024-02-01 NOTE — Op Note (Addendum)
 [facility] Patient Name: Tanya Nolan Procedure Date: 02/01/2024 3:03 PM MRN: [id number] Endoscopist: Napoleon Form , MD, [id number] Age: 48 Referring MD:  Date of Birth: Dec 08, 1975 Gender: Female Account #: [id number] Procedure:                Colonoscopy Indications:              Evaluation of unexplained GI bleeding presenting                            with Hematochezia Medicines:                Monitored Anesthesia Care Procedure:                Pre-Anesthesia Assessment:                           - Prior to the procedure, a History and Physical                            was performed, and patient medications and                            allergies were reviewed. The patient's tolerance of                            previous anesthesia was also reviewed. The risks                            and benefits of the procedure and the sedation                            options and risks were discussed with the patient.                            All questions were answered, and informed consent                            was obtained. Prior Anticoagulants: The patient                            last took Plavix (clopidogrel) 5 days prior to the                            procedure. ASA Grade Assessment: III - A patient                            with severe systemic disease. After reviewing the                            risks and benefits, the patient was deemed in                            satisfactory condition to undergo the procedure.  After obtaining informed consent, the colonoscope                            was passed under direct vision. Throughout the                            procedure, the patient's blood pressure, pulse, and                            oxygen saturations were monitored continuously. The                            [id number]-HQ190L Colonoscope [id number] was introduced                            through the anus and  advanced to the the cecum,                            identified by appendiceal orifice and ileocecal                            valve. The colonoscopy was performed without                            difficulty. The patient tolerated the procedure                            well. The quality of the bowel preparation was                            adequate. The ileocecal valve, appendiceal orifice,                            and rectum were photographed. Scope In: 3:09:51 PM Scope Out: 3:27:02 PM Scope Withdrawal Time: 0 hours 13 minutes 18 seconds  Total Procedure Duration: 0 hours 17 minutes 11 seconds  Findings:                 The perianal and digital rectal examinations were                            normal.                           Ten sessile polyps were found in the rectum X3,                            descending colon X3 and transverse colon X4. The                            polyps were 4 to 12 mm in size. These polyps were                            removed with a cold snare. Resection and retrieval  were complete.                           Scattered large-mouthed, medium-mouthed and                            small-mouthed diverticula were found in the sigmoid                            colon, descending colon, transverse colon and                            ascending colon. Peri-diverticular erythema was                            seen.                           Non-bleeding external and internal hemorrhoids were                            found during retroflexion. The hemorrhoids were                            medium-sized. Complications:            No immediate complications. Estimated Blood Loss:     Estimated blood loss was minimal. Impression:               - Ten 4 to 12 mm polyps in the rectum, in the                            descending colon and in the transverse colon,                            removed with a cold snare. Resected  and retrieved.                           - Moderate diverticulosis in the sigmoid colon, in                            the descending colon, in the transverse colon and                            in the ascending colon. Peri-diverticular erythema                            was seen.                           - Non-bleeding external and internal hemorrhoids. Recommendation:           - Patient has a contact number available for                            emergencies. The signs and symptoms of potential  delayed complications were discussed with the                            patient. Return to normal activities tomorrow.                            Written discharge instructions were provided to the                            patient.                           - Resume previous diet.                           - Continue present medications.                           - Await pathology results.                           - Repeat colonoscopy in 1 year for surveillance                            based on pathology results.                           - Resume Plavix (clopidogrel) at prior dose                            tomorrow. Refer to managing physician for further                            adjustment of therapy.                           - Schedule hemorrhoid band ligation office visit                            next available appointment for symptomatic                            hemorrhoids Napoleon Form, MD 02/01/2024 3:36:12 PM This report has been signed electronically.

## 2024-02-01 NOTE — Progress Notes (Signed)
 Called to room to assist during endoscopic procedure.  Patient ID and intended procedure confirmed with present staff. Received instructions for my participation in the procedure from the performing physician.

## 2024-02-01 NOTE — Progress Notes (Signed)
 Pt A/O x 3, gd SR's, pleased with anesthesia, report to RN

## 2024-02-01 NOTE — Progress Notes (Signed)
 [name] History and Physical   Primary Care Physician:  Doreene Eland, MD   Reason for Procedure:  Rectal bleeding  Plan:     colonoscopy with possible interventions as needed     HPI: Tanya Nolan is a very pleasant 48 y.o. female here for colonoscopy for evaluation of rectal bleeding.   The risks and benefits as well as alternatives of endoscopic procedure(s) have been discussed and reviewed. All questions answered. The patient agrees to proceed.    Past Medical History:  Diagnosis Date   Abdominal pain 03/26/2009   Qualifier: Diagnosis of  By: Jimmey Ralph MD, Jennifer     Anxiety    BACK STRAIN, LUMBAR 03/26/2009   Qualifier: Diagnosis of  By: Jimmey Ralph MD, Victorino Dike     CAD (coronary artery disease) of artery bypass graft    [place] 10/01/2007   Qualifier: Diagnosis of  By: Karn Pickler MD, Jessica     Chronic back pain    tx with ibuprofen   Diabetes mellitus    Hypercholesteremia    Hyperlipidemia    no meds - tx with diet   Hypertension    Hypothyroidism    Incontinence in female 10/05/2020   Ingrown toenail 12/25/2014   Ischemic heart disease due to coronary artery obstruction (HCC) 03/16/2022   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   C&B WNL  NEG PAPS after   MDD (major depressive disorder), recurrent severe, 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   Nipple discharge 10/13/2013   Nodule of skin of left foot 04/15/2021   Non compliance w medication regimen 10/13/2013   Non-ST elevation (NSTEMI) myocardial infarction [facility])    Simple endometrial hyperplasia 09/2006   BENIGN SECRETORY 02/2007   Thyroid dysfunction     Past Surgical History:  Procedure Laterality Date   CORONARY ARTERY BYPASS GRAFT N/A 03/17/2022   Procedure: CORONARY ARTERY BYPASS GRAFTING X THREE, USING LEFT INTERNAL MAMMARY ARTERY, LEFT RADIAL ARTERY, RIGHT GREATER SAPHENOUS VEIN HARVESTED ENDOSCOPICALLY;  Surgeon: Alleen Borne, MD;  Location: [facility] OR;  Service:  Open Heart Surgery;  Laterality: N/A;   DISC DECOMPRESSION  2008   HYSTEROSCOPY X 2  2007 / 2012   ENDOMETRIAL POLYPS REMOVED   LEFT HEART CATH AND CORONARY ANGIOGRAPHY N/A 03/15/2022   Procedure: LEFT HEART CATH AND CORONARY ANGIOGRAPHY;  Surgeon: Kathleene Hazel, MD;  Location: [facility] INVASIVE CV LAB;  Service: Cardiovascular;  Laterality: N/A;   RADIAL ARTERY HARVEST Left 03/17/2022   Procedure: RADIAL ARTERY HARVEST;  Surgeon: Alleen Borne, MD;  Location: [facility] OR;  Service: Open Heart Surgery;  Laterality: Left;   TEE WITHOUT CARDIOVERSION N/A 03/17/2022   Procedure: TRANSESOPHAGEAL ECHOCARDIOGRAM (TEE);  Surgeon: Alleen Borne, MD;  Location: [facility] OR;  Service: Open Heart Surgery;  Laterality: N/A;    Prior to Admission medications   Medication Sig Start Date End Date Taking? Authorizing Provider  ARIPiprazole (ABILIFY) 5 MG tablet Take 1 tablet (5 mg total) by mouth daily. 09/11/23  Yes McDiarmid, Leighton Roach, MD  aspirin EC 81 MG tablet Take 1 tablet (81 mg total) by mouth daily. Swallow whole. 06/01/23  Yes Doreene Eland, MD  atorvastatin (LIPITOR) 80 MG tablet Take 1 tablet (80 mg total) by mouth daily. 06/26/23  Yes McDiarmid, Leighton Roach, MD  empagliflozin (JARDIANCE) 10 MG TABS tablet Take 1 tablet (10 mg total) by mouth daily. 09/25/23  Yes McDiarmid, Leighton Roach, MD  ezetimibe (ZETIA) 10 MG tablet Take 1 tablet (10 mg total) by mouth  daily. 09/25/23  Yes McDiarmid, Leighton Roach, MD  levothyroxine (SYNTHROID) 125 MCG tablet Take 1 tablet (125 mcg total) by mouth daily before breakfast. Reported on 02/23/2016 06/01/23  Yes Janit Pagan T, MD  metoprolol succinate (TOPROL XL) 25 MG 24 hr tablet Take 1 tablet (25 mg total) by mouth daily. 01/22/24  Yes Ronney Asters, NP  sertraline (ZOLOFT) 50 MG tablet Take 1 tablet (50 mg total) by mouth daily. For depression 09/25/23  Yes McDiarmid, Leighton Roach, MD  valsartan-hydrochlorothiazide (DIOVAN-HCT) 80-12.5 MG tablet Take 1 tablet by mouth daily. 09/11/23  Yes  McDiarmid, Leighton Roach, MD  Accu-Chek FastClix Lancets MISC Use to check CBG TID Patient not taking: Reported on 01/22/2024 07/22/19   Doreene Eland, MD  acetaminophen (TYLENOL) 500 MG tablet Take 1,000 mg by mouth every 6 (six) hours as needed for moderate pain (pain score 4-6) or headache.    [provider]  albuterol (PROAIR HFA) 108 (90 Base) MCG/ACT inhaler Inhale 1-2 puffs into the lungs every 6 (six) hours as needed for wheezing or shortness of breath. 05/20/21   Doreene Eland, MD  artificial tears (LACRILUBE) OINT ophthalmic ointment Place into the left eye at bedtime as needed for dry eyes. Patient not taking: Reported on 01/22/2024 06/29/22   Terrilee Files, MD  Blood Glucose Monitoring Suppl (ACCU-CHEK GUIDE) w/Device KIT 1 applicator by Does not apply route 3 (three) times daily. Use to check CBG TID Patient not taking: Reported on 01/22/2024 07/22/19   Doreene Eland, MD  cetirizine (ZYRTEC) 10 MG tablet Take 1 tablet (10 mg total) by mouth daily as needed for allergies. 03/21/22   Ardelle Balls, PA-C  clopidogrel (PLAVIX) 75 MG tablet Take 1 tablet (75 mg total) by mouth daily. 09/11/23   McDiarmid, Leighton Roach, MD  cyclobenzaprine (FLEXERIL) 5 MG tablet Take 1 tablet (5 mg total) by mouth at bedtime as needed for muscle spasms. 11/15/23   Doreene Eland, MD  Evolocumab (REPATHA SURECLICK) 140 MG/ML SOAJ INJECT 140 MG INTO THE SKIN EVERY 14 DAYS 06/25/23   Marykay Lex, MD  glucose blood (ACCU-CHEK GUIDE) test strip USE TO CHECK GBG 3 TIMES A DAY Patient not taking: Reported on 01/22/2024 04/13/21   Simmons-Robinson, Tawanna Cooler, MD  Semaglutide, 1 MG/DOSE, 4 MG/3ML SOPN Inject 1 mg into the skin once a week. 09/11/23   McDiarmid, Leighton Roach, MD  sodium chloride (OCEAN) 0.65 % SOLN nasal spray Place 1 spray into both nostrils as needed for congestion. 11/25/23   Elayne Snare K, DO  insulin lispro protamine-insulin lispro (HUMALOG 75/25) (75-25) 100 UNIT/ML SUSP Inject 40-50  Units into the skin 2 (two) times daily with a meal. Patient takes 40 units in the morning and 50 units at night.  Verified it is Humalog 75/25.  04/18/12  [provider]    Current Outpatient Medications  Medication Sig Dispense Refill   ARIPiprazole (ABILIFY) 5 MG tablet Take 1 tablet (5 mg total) by mouth daily. 30 tablet 3   aspirin EC 81 MG tablet Take 1 tablet (81 mg total) by mouth daily. Swallow whole. 90 tablet 1   atorvastatin (LIPITOR) 80 MG tablet Take 1 tablet (80 mg total) by mouth daily. 90 tablet 3   empagliflozin (JARDIANCE) 10 MG TABS tablet Take 1 tablet (10 mg total) by mouth daily. 30 tablet 11   ezetimibe (ZETIA) 10 MG tablet Take 1 tablet (10 mg total) by mouth daily. 30 tablet 11   levothyroxine (SYNTHROID)  125 MCG tablet Take 1 tablet (125 mcg total) by mouth daily before breakfast. Reported on 02/23/2016 90 tablet 1   metoprolol succinate (TOPROL XL) 25 MG 24 hr tablet Take 1 tablet (25 mg total) by mouth daily. 30 tablet 6   sertraline (ZOLOFT) 50 MG tablet Take 1 tablet (50 mg total) by mouth daily. For depression 30 tablet 3   valsartan-hydrochlorothiazide (DIOVAN-HCT) 80-12.5 MG tablet Take 1 tablet by mouth daily. 30 tablet 11   Accu-Chek FastClix Lancets MISC Use to check CBG TID (Patient not taking: Reported on 01/22/2024) 100 each 12   acetaminophen (TYLENOL) 500 MG tablet Take 1,000 mg by mouth every 6 (six) hours as needed for moderate pain (pain score 4-6) or headache.     albuterol (PROAIR HFA) 108 (90 Base) MCG/ACT inhaler Inhale 1-2 puffs into the lungs every 6 (six) hours as needed for wheezing or shortness of breath. 18 g 1   artificial tears (LACRILUBE) OINT ophthalmic ointment Place into the left eye at bedtime as needed for dry eyes. (Patient not taking: Reported on 01/22/2024) 3.5 g 0   Blood Glucose Monitoring Suppl (ACCU-CHEK GUIDE) w/Device KIT 1 applicator by Does not apply route 3 (three) times daily. Use to check CBG TID (Patient not taking:  Reported on 01/22/2024) 1 kit 0   cetirizine (ZYRTEC) 10 MG tablet Take 1 tablet (10 mg total) by mouth daily as needed for allergies.     clopidogrel (PLAVIX) 75 MG tablet Take 1 tablet (75 mg total) by mouth daily. 90 tablet 1   cyclobenzaprine (FLEXERIL) 5 MG tablet Take 1 tablet (5 mg total) by mouth at bedtime as needed for muscle spasms. 30 tablet 0   Evolocumab (REPATHA SURECLICK) 140 MG/ML SOAJ INJECT 140 MG INTO THE SKIN EVERY 14 DAYS 2 mL 6   glucose blood (ACCU-CHEK GUIDE) test strip USE TO CHECK GBG 3 TIMES A DAY (Patient not taking: Reported on 01/22/2024) 100 strip 1   Semaglutide, 1 MG/DOSE, 4 MG/3ML SOPN Inject 1 mg into the skin once a week. 3 mL 1   sodium chloride (OCEAN) 0.65 % SOLN nasal spray Place 1 spray into both nostrils as needed for congestion. 15 mL 0   Current Facility-Administered Medications  Medication Dose Route Frequency Provider Last Rate Last Admin   0.9 %  sodium chloride infusion  500 mL Intravenous Continuous Yeraldine Forney, Eleonore Chiquito, MD        Allergies as of 02/01/2024 - Review Complete 02/01/2024  Allergen Reaction Noted   Metformin Diarrhea 07/12/2022    Family History  Problem Relation Age of Onset   Lupus Mother    Hypertension Mother    Heart disease Father    Gout Father    Lupus Father    Hypertension Sister    Breast cancer Neg Hx     Social History   Socioeconomic History   Marital status: Widowed    Spouse name: Not on file   Number of children: 0   Years of education: Not on file   Highest education level: Not on file  Occupational History   Occupation: CNA  Tobacco Use   Smoking status: Every Day    Current packs/day: 1.00    Average packs/day: 1 pack/day for 8.0 years (8.0 ttl pk-yrs)    Types: Cigarettes    Passive exposure: Current   Smokeless tobacco: Never  Vaping Use   Vaping status: Never Used  Substance and Sexual Activity   Alcohol use: Yes  Alcohol/week: 0.0 standard drinks of alcohol    Comment: socially -  wine/liquor--Rare   Drug use: Yes    Frequency: 7.0 times per week    Types: Marijuana   Sexual activity: Yes    Birth control/protection: None    Comment: 1st intercourse 48 yo-More than 5 partners  Other Topics Concern   Not on file  Social History Narrative   Not on file   Social Drivers of Health   Financial Resource Strain: Not on file  Food Insecurity: Not on file  Transportation Needs: Not on file  Physical Activity: Not on file  Stress: Not on file  Social Connections: Not on file  Intimate Partner Violence: Not on file    Review of Systems:  All other review of systems negative except as mentioned in the HPI.  Physical Exam: Vital signs in last 24 hours: BP (!) 151/76   Pulse 65   Temp (!) 97.2 F (36.2 C) (Temporal)   Ht 5\' 7"  (1.702 m)   Wt 232 lb (105.2 kg)   SpO2 100%   BMI 36.34 kg/m  General:   Alert, NAD Lungs:  Clear .   Heart:  Regular rate and rhythm Abdomen:  Soft, nontender and nondistended. Neuro/Psych:  Alert and cooperative. Normal mood and affect. A and O x 3  Reviewed labs, radiology imaging, old records and pertinent past GI work up  Patient is appropriate for planned procedure(s) and anesthesia in an ambulatory setting   K. Scherry Ran , MD [phone]

## 2024-02-04 ENCOUNTER — Telehealth: Payer: Self-pay

## 2024-02-04 NOTE — Telephone Encounter (Signed)
 Follow up call to pt, lm for pt to call if having any difficulty with normal activities or eating and drinking.  Also to call if any other questions or concerns.

## 2024-02-06 LAB — SURGICAL PATHOLOGY

## 2024-03-25 ENCOUNTER — Ambulatory Visit: Payer: Self-pay | Admitting: Gastroenterology

## 2024-03-25 NOTE — Telephone Encounter (Signed)
 Contacted the patient to schedule appt but I ended up getting her voicemail. I did leave her a voicemail to contact us  back when she get a chance.

## 2024-04-21 ENCOUNTER — Encounter: Payer: Self-pay | Admitting: Family Medicine

## 2024-04-22 ENCOUNTER — Ambulatory Visit (INDEPENDENT_AMBULATORY_CARE_PROVIDER_SITE_OTHER): Payer: MEDICAID | Admitting: Family Medicine

## 2024-04-22 ENCOUNTER — Encounter: Payer: Self-pay | Admitting: Family Medicine

## 2024-04-22 ENCOUNTER — Other Ambulatory Visit (HOSPITAL_COMMUNITY)
Admission: RE | Admit: 2024-04-22 | Discharge: 2024-04-22 | Disposition: A | Discharge status: Home / Self Care | Source: Ambulatory Visit | Attending: Family Medicine | Admitting: Family Medicine

## 2024-04-22 VITALS — BP 179/89 | HR 69 | Ht 67.0 in | Wt 235.0 lb

## 2024-04-22 DIAGNOSIS — Z114 Encounter for screening for human immunodeficiency virus [HIV]: Secondary | ICD-10-CM

## 2024-04-22 DIAGNOSIS — Z111 Encounter for screening for respiratory tuberculosis: Secondary | ICD-10-CM

## 2024-04-22 DIAGNOSIS — F32A Depression, unspecified: Secondary | ICD-10-CM

## 2024-04-22 DIAGNOSIS — E1169 Type 2 diabetes mellitus with other specified complication: Secondary | ICD-10-CM

## 2024-04-22 DIAGNOSIS — Z23 Encounter for immunization: Secondary | ICD-10-CM

## 2024-04-22 DIAGNOSIS — Z113 Encounter for screening for infections with a predominantly sexual mode of transmission: Secondary | ICD-10-CM

## 2024-04-22 DIAGNOSIS — Z124 Encounter for screening for malignant neoplasm of cervix: Secondary | ICD-10-CM

## 2024-04-22 DIAGNOSIS — E1159 Type 2 diabetes mellitus with other circulatory complications: Secondary | ICD-10-CM

## 2024-04-22 DIAGNOSIS — I152 Hypertension secondary to endocrine disorders: Secondary | ICD-10-CM

## 2024-04-22 DIAGNOSIS — E785 Hyperlipidemia, unspecified: Secondary | ICD-10-CM

## 2024-04-22 DIAGNOSIS — I10 Essential (primary) hypertension: Secondary | ICD-10-CM

## 2024-04-22 LAB — POCT GLYCOSYLATED HEMOGLOBIN (HGB A1C): HbA1c, POC (controlled diabetic range): 10.5 % — AB (ref 0.0–7.0)

## 2024-04-22 MED ORDER — ARIPIPRAZOLE 5 MG PO TABS
5.0000 mg | ORAL_TABLET | Freq: Every day | ORAL | 1 refills | Status: DC
Start: 2024-04-22 — End: 2024-06-30

## 2024-04-22 MED ORDER — CLOPIDOGREL BISULFATE 75 MG PO TABS: 75.0000 mg | ORAL_TABLET | Freq: Every day | ORAL | 1 refills | Status: DC

## 2024-04-22 MED ORDER — ASPIRIN 81 MG PO TBEC: 81.0000 mg | DELAYED_RELEASE_TABLET | Freq: Every day | ORAL | 1 refills | Status: AC

## 2024-04-22 MED ORDER — ATORVASTATIN CALCIUM 80 MG PO TABS: 80.0000 mg | ORAL_TABLET | Freq: Every day | ORAL | 1 refills | Status: DC

## 2024-04-22 MED ORDER — SERTRALINE HCL 50 MG PO TABS: 50.0000 mg | ORAL_TABLET | Freq: Every day | ORAL | 1 refills | Status: DC

## 2024-04-22 MED ORDER — VALSARTAN-HYDROCHLOROTHIAZIDE 80-12.5 MG PO TABS: 1.0000 | ORAL_TABLET | Freq: Every day | ORAL | 1 refills | Status: DC

## 2024-04-22 MED ORDER — CLONIDINE HCL 0.1 MG PO TABS
0.1000 mg | ORAL_TABLET | Freq: Once | ORAL | Status: AC
  Administered 2024-04-22: 0.1 mg via ORAL

## 2024-04-22 MED ORDER — LEVOTHYROXINE SODIUM 125 MCG PO TABS: 125.0000 ug | ORAL_TABLET | Freq: Every day | ORAL | 1 refills | Status: DC

## 2024-04-22 MED ORDER — EZETIMIBE 10 MG PO TABS: 10.0000 mg | ORAL_TABLET | Freq: Every day | ORAL | 1 refills | Status: DC

## 2024-04-22 NOTE — Patient Instructions (Signed)
 Diabetic Retinopathy  Diabetic retinopathy is a disease of the retina. The retina is a light-sensitive membrane at the back of the eye. Retinopathy is a complication of diabetes and a common cause of bad eyesight. It can eventually cause blindness. Early detection and treatment of diabetic retinopathy is important in keeping your eyes healthy and preventing further damage to them. What are the causes? Diabetic retinopathy is caused by blood sugar (glucose) levels that are too high for a long period of time. Blood glucose levels that are too high for a long time can: Damage small blood vessels in the retina, allowing blood to leak through the vessel walls. Cause new, abnormal blood vessels to grow on the retina. This can scar the retina in the advanced stage of diabetic retinopathy. What increases the risk? You are more likely to develop this condition if: You have had diabetes for a long time. You have poorly controlled blood glucose. You have high blood pressure. What are the signs or symptoms? In the early stages of diabetic retinopathy, there are often no symptoms. As the condition gets worse, symptoms may include: Blurred vision. This is usually caused by swelling due to abnormal blood glucose levels. The blurriness may go away when blood glucose levels return to normal. Moving specks or dark spots (floaters) in your vision. These can be caused by a small amount of bleeding (hemorrhage) from retinal blood vessels. Missing parts of your field of vision, such as vision at the sides of the eyes. This can be caused by larger retinal hemorrhages. Difficulty reading. Double vision. Pain in one or both eyes. Feeling pressure in one or both eyes. Trouble seeing straight lines. Straight lines may not look straight. Redness of the eyes that does not go away. How is this diagnosed? This condition may be diagnosed with an eye exam. For this exam, your eye specialist puts drops in your eyes that  enlarge your pupils. The retina is then checked for changes in its blood vessels. How is this treated? This condition may be treated by: Keeping your blood glucose and blood pressure within a target range. Using a type of laser beam to seal your retinal blood vessels. This stops them from bleeding and decreases pressure in your eye. Getting shots of medicine in the eye to reduce swelling of the center of the retina (macula). You may be given: Anti-VEGF medicine. This medicine can help slow vision loss, and may even improve vision. Steroid medicine. Follow these instructions at home: Follow your diabetes management plan as directed by your health care provider. This may include exercising regularly and eating a healthy diet. Keep your blood glucose level and your blood pressure in your target range. Your health care provider will tell you what your target is. Check your blood glucose as often as directed. Take over the counter and prescription medicines only as told by your health care provider. This includes insulin  and oral diabetes medicine. Get your eyes checked at least once every year. An eye specialist can usually see diabetic retinopathy developing long before it starts to cause problems. In many cases, it can be treated to prevent problems from starting in the first place. Do not use any products that contain nicotine or tobacco, such as cigarettes, e-cigarettes, and chewing tobacco. If you need help quitting, ask your health care provider. Keep all follow-up visits. This is important. Contact a health care provider if: You notice gradual blurring or other changes in your vision over time. You notice that your  glasses or contact lenses do not make things look as sharp as they once did. You have trouble reading or seeing details at a distance with either eye. You notice a change in your vision or notice that parts of your field of vision appear missing or hazy. You suddenly see moving  specks or dark spots in the field of vision of either eye. Get help right away if: You have sudden pain or pressure in one or both eyes. You suddenly lose vision or a curtain or veil seems to come across your eyes. You have a sudden burst of floaters in your vision. Summary Diabetic retinopathy is a disease of the retina. The retina is a light-sensitive membrane at the back of the eye. Retinopathy is a complication of diabetes. Get your eyes checked at least once every year. An eye specialist can usually see diabetic retinopathy developing long before it starts to cause problems. In many cases, it can be treated to prevent problems from starting in the first place. Keep your blood glucose and your blood pressure in your target range. Follow your diabetes management plan as directed by your health care provider. Get help right away if you have sudden pain or sudden pressure in one or both eyes. Also, get help if you have a sudden burst of floaters in your vision. This information is not intended to replace advice given to you by your health care provider. Make sure you discuss any questions you have with your health care provider. Document Revised: 09/14/2023 Document Reviewed: 09/14/2023 Elsevier Patient Education  2025 [employer].

## 2024-04-22 NOTE — Assessment & Plan Note (Addendum)
 A1C increased today from 9.4 to 10.5 Poorly controlled DM2 due to medication non-adherence due to coverage issue Urgent referral to [facility] made to help with coverage and med management She agreed with the plan

## 2024-04-22 NOTE — Progress Notes (Signed)
    SUBJECTIVE:   CHIEF COMPLAINT / HPI:   DM2/HTN: Off all meds for 1-2 months due to a change in insurance and her inability to afford her meds. She had eye exam done last month at [facility]" but did not get retina scan done due to cost. Denies headache, no vision change, no neurologic or cardio-pulm symptoms.   STD screen: She wants an STD screen. Denies vaginal discharge. No recent STD exposure. LMP was 04/18/24. She denies the risk of pregnancy and does not want to be on contraceptives.  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 The school requested an update on her TB,and Varicella's immune status  PERTINENT  PMH / PSH: PMHx reviewed  OBJECTIVE:   BP (!) 179/89   Pulse 69   Ht 5\' 7"  (1.702 m)   Wt 235 lb (106.6 kg)   LMP 04/18/2024   SpO2 100%   BMI 36.81 kg/m   Physical Exam Vitals and nursing note reviewed. Exam conducted with a chaperone present Glendell Landry Leggette).  Constitutional:      Appearance: Normal appearance.  Cardiovascular:     Rate and Rhythm: Normal rate and regular rhythm.     Heart sounds: Normal heart sounds. No murmur heard. Pulmonary:     Effort: Pulmonary effort is normal. No respiratory distress.     Breath sounds: Normal breath sounds. No stridor. No wheezing.  Abdominal:     General: Abdomen is flat. Bowel sounds are normal. There is no distension.     Palpations: Abdomen is soft. There is no mass.     Tenderness: There is no abdominal tenderness.  Genitourinary:    Comments: Just getting off her period. Scanty blood in vagina vault and cervical os Neurological:     Mental Status: She is alert and oriented to person, place, and time.  Psychiatric:        Mood and Affect: Mood normal.        Behavior: Behavior normal.        Thought Content: Thought content normal.        Judgment: Judgment normal.     Comments: No HI or SI      ASSESSMENT/PLAN:   Assessment & Plan Type 2 diabetes  mellitus with hyperlipidemia (HCC) A1C increased today from 9.4 to 10.5 Poorly controlled DM2 due to medication non-adherence due to coverage issue Urgent referral to [facility] made to help with coverage and med management She agreed with the plan Cervical cancer screening PAP recommended Agreed with the plan and PAP was completed I will call her soon with her test results Screen for STD (sexually transmitted disease) Cervical swab completed for STD screening Return for lab as phlebotomist is currently not available - HIV and RPR Need for vaccination PCV20 given  School lab request - return for lab, appointment made. Hypertension associated with diabetes (HCC) Systolic BP was 200+ today Completely asymptomatic We gave her Clonidine  0.1 mg once BP improved to 179/89 Risk of not taking all of her medications discussed Plan to refill meds and send [facility] referral to help with meds coverage  Follow up in 2-4 weeks.  Penni Bowman, MD [facility]

## 2024-04-22 NOTE — Assessment & Plan Note (Addendum)
 Systolic BP was 200+ today Completely asymptomatic We gave her Clonidine  0.1 mg once BP improved to 179/89 Risk of not taking all of her medications discussed Plan to refill meds and send [facility] referral to help with meds coverage

## 2024-04-23 ENCOUNTER — Other Ambulatory Visit: Payer: Self-pay | Admitting: Family Medicine

## 2024-04-23 ENCOUNTER — Ambulatory Visit: Payer: Self-pay | Admitting: Family Medicine

## 2024-04-23 LAB — CERVICOVAGINAL ANCILLARY ONLY
Bacterial Vaginitis (gardnerella): POSITIVE — AB
Candida Glabrata: NEGATIVE
Candida Vaginitis: NEGATIVE
Chlamydia: NEGATIVE
Comment: NEGATIVE
Comment: NEGATIVE
Comment: NEGATIVE
Comment: NEGATIVE
Comment: NEGATIVE
Comment: NORMAL
Neisseria Gonorrhea: NEGATIVE
Trichomonas: NEGATIVE

## 2024-04-23 LAB — MICROALBUMIN / CREATININE URINE RATIO
Creatinine, Urine: 223.9 mg/dL
Microalb/Creat Ratio: 12 mg/g{creat} (ref 0–29)
Microalbumin, Urine: 26.4 ug/mL

## 2024-04-23 MED ORDER — CLINDAMYCIN PHOSPHATE 2 % VA CREA: 1.0000 | TOPICAL_CREAM | Freq: Every day | VAGINAL | 0 refills | Status: AC

## 2024-04-24 ENCOUNTER — Other Ambulatory Visit: Payer: Self-pay

## 2024-04-24 DIAGNOSIS — Z114 Encounter for screening for human immunodeficiency virus [HIV]: Secondary | ICD-10-CM

## 2024-04-24 DIAGNOSIS — Z111 Encounter for screening for respiratory tuberculosis: Secondary | ICD-10-CM

## 2024-04-24 DIAGNOSIS — Z23 Encounter for immunization: Secondary | ICD-10-CM

## 2024-04-24 DIAGNOSIS — Z113 Encounter for screening for infections with a predominantly sexual mode of transmission: Secondary | ICD-10-CM

## 2024-04-24 LAB — CYTOLOGY - PAP
Comment: NEGATIVE
Diagnosis: NEGATIVE
High risk HPV: NEGATIVE

## 2024-04-28 ENCOUNTER — Telehealth: Payer: Self-pay | Admitting: Pharmacist

## 2024-04-28 NOTE — Telephone Encounter (Signed)
 Patient contacted for follow-up of medication request for assistance/help.   Patient reports she currently has small supply of all of her medications.  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  Recently she changed from this to [employer] due to a limit of [employer] (patient believes she no longer qualified).   We discussed financial limits for Medicaid AND patient is willing to pursue investigation to determine eligibility at this time as she is working less while trying to go to school and also help taking care of her mother.  We discussed options for attempting to enroll / investigate eligibility.   We also discussed possibility of a charge account through one of the [employer]  At this time, patient was not interested in that option.   Tobacco Use Disorder was also discussed.  Patient reports smoking a package every three days (7 cigs per day).  Encouraged continued efforts to reduce intake and eventually attempt quit in near future.   Total time with patient call and documentation of interaction: 14 minutes.   Shared direct line to call back directly: [phone]

## 2024-04-29 LAB — QUANTIFERON-TB GOLD PLUS
QuantiFERON Nil Value: 0.11 [IU]/mL
QuantiFERON TB1 Ag Value: 0.05 [IU]/mL
QuantiFERON TB2 Ag Value: 0.05 [IU]/mL

## 2024-04-29 LAB — VARICELLA ZOSTER ABS, IGG/IGM
Varicella IgM: 1.18 {index} — ABNORMAL HIGH (ref 0.00–0.90)
Varicella zoster IgG: REACTIVE

## 2024-04-29 LAB — RPR: RPR Ser Ql: NONREACTIVE

## 2024-04-29 LAB — HIV ANTIBODY (ROUTINE TESTING W REFLEX): HIV Screen 4th Generation wRfx: NONREACTIVE

## 2024-04-29 NOTE — Telephone Encounter (Signed)
 Reviewed and agree with Dr Macky Lower plan.

## 2024-06-30 ENCOUNTER — Telehealth: Payer: Self-pay

## 2024-06-30 ENCOUNTER — Other Ambulatory Visit: Payer: Self-pay

## 2024-06-30 ENCOUNTER — Other Ambulatory Visit (HOSPITAL_COMMUNITY): Payer: Self-pay

## 2024-06-30 ENCOUNTER — Telehealth: Payer: Self-pay | Admitting: Pharmacist

## 2024-06-30 DIAGNOSIS — E1169 Type 2 diabetes mellitus with other specified complication: Secondary | ICD-10-CM

## 2024-06-30 DIAGNOSIS — F32A Depression, unspecified: Secondary | ICD-10-CM

## 2024-06-30 DIAGNOSIS — I252 Old myocardial infarction: Secondary | ICD-10-CM

## 2024-06-30 DIAGNOSIS — I152 Hypertension secondary to endocrine disorders: Secondary | ICD-10-CM

## 2024-06-30 MED ORDER — ATORVASTATIN CALCIUM 80 MG PO TABS
80.0000 mg | ORAL_TABLET | Freq: Every day | ORAL | 11 refills | Status: DC
  Filled 2024-06-30: qty 30, 30d supply, fill #0
  Filled 2024-09-23 – 2024-10-03 (×2): qty 30, 30d supply, fill #1

## 2024-06-30 MED ORDER — METOPROLOL SUCCINATE ER 25 MG PO TB24
25.0000 mg | ORAL_TABLET | Freq: Every day | ORAL | 6 refills | Status: DC
  Filled 2024-06-30: qty 30, 30d supply, fill #0
  Filled 2024-09-23 – 2024-10-03 (×2): qty 30, 30d supply, fill #1

## 2024-06-30 MED ORDER — SERTRALINE HCL 100 MG PO TABS
100.0000 mg | ORAL_TABLET | Freq: Every day | ORAL | 3 refills | Status: DC
  Filled 2024-06-30: qty 30, 30d supply, fill #0
  Filled 2024-09-23 – 2024-10-03 (×2): qty 30, 30d supply, fill #1

## 2024-06-30 MED ORDER — SEMAGLUTIDE (1 MG/DOSE) 4 MG/3ML ~~LOC~~ SOPN
1.0000 mg | PEN_INJECTOR | SUBCUTANEOUS | 1 refills | Status: DC
  Filled 2024-06-30 – 2024-07-17 (×2): qty 3, 28d supply, fill #0

## 2024-06-30 MED ORDER — REPATHA SURECLICK 140 MG/ML ~~LOC~~ SOAJ
140.0000 mg | SUBCUTANEOUS | 6 refills | Status: DC
  Filled 2024-06-30 – 2024-07-17 (×2): qty 2, 28d supply, fill #0

## 2024-06-30 MED ORDER — VALSARTAN-HYDROCHLOROTHIAZIDE 80-12.5 MG PO TABS
1.0000 | ORAL_TABLET | Freq: Every day | ORAL | 11 refills | Status: DC
  Filled 2024-06-30: qty 30, 30d supply, fill #0
  Filled 2024-09-23 – 2024-10-03 (×2): qty 30, 30d supply, fill #1

## 2024-06-30 MED ORDER — ARIPIPRAZOLE 5 MG PO TABS
5.0000 mg | ORAL_TABLET | Freq: Every day | ORAL | 1 refills | Status: AC
  Filled 2024-06-30: qty 30, 30d supply, fill #0
  Filled 2024-09-23 – 2024-10-03 (×2): qty 30, 30d supply, fill #1

## 2024-06-30 NOTE — Telephone Encounter (Signed)
 Patient contacted for follow-up of Medication management and glycemic control  Since last contact patient reports being out of multiple medications and reports not checking sugars   Medication Plan: -Reordered multiple medications for patient that they were in need of to get to visit with PCP. -Ozempic  (semaglutide ) dose may be able to change at next visit as weight has been stable  Total time with patient call and documentation of interaction: 8 minutes.  Follow-up with PCP: 07/18/24

## 2024-06-30 NOTE — Telephone Encounter (Signed)
 Reviewed and agree with Dr Rennis plan.

## 2024-07-01 ENCOUNTER — Other Ambulatory Visit: Payer: Self-pay

## 2024-07-01 ENCOUNTER — Other Ambulatory Visit: Payer: Self-pay | Admitting: Medical Genetics

## 2024-07-03 ENCOUNTER — Other Ambulatory Visit (HOSPITAL_COMMUNITY)

## 2024-07-03 ENCOUNTER — Encounter: Payer: Self-pay | Admitting: Family Medicine

## 2024-07-03 ENCOUNTER — Telehealth: Payer: Self-pay

## 2024-07-03 ENCOUNTER — Other Ambulatory Visit (HOSPITAL_COMMUNITY): Payer: Self-pay

## 2024-07-03 DIAGNOSIS — E1169 Type 2 diabetes mellitus with other specified complication: Secondary | ICD-10-CM | POA: Insufficient documentation

## 2024-07-03 NOTE — Telephone Encounter (Signed)
 Pharmacy Patient Advocate Encounter  Received notification from [facility] that Prior Authorization for OZEMPIC  1MG  DOSE PENS has been APPROVED from 07/03/24 to 07/03/25   PA #/Case ID/Reference #: [id number]

## 2024-07-03 NOTE — Telephone Encounter (Signed)
 In process of completing PA received for patients Repatha .  What dx is this being used for? No diagnosis of cholesterol or hyperlipidemia noted on patients chart.

## 2024-07-03 NOTE — Telephone Encounter (Signed)
 Prior authorization submitted for REPATHA  SURECLICK to [facility] Medicaid via Latent.   Key: [id number]

## 2024-07-03 NOTE — Telephone Encounter (Signed)
 Prior authorization submitted for OZEMPIC  1MG  DOSE PENS to [employer] Medicaid via Latent.   Key: [id number]

## 2024-07-04 NOTE — Telephone Encounter (Signed)
 Pharmacy Patient Advocate Encounter  Received notification from [facility] that Prior Authorization for REPATHA  SURECLICK has been DENIED.  Full denial letter will be uploaded to the media tab. See denial reason below.      PA #/Case ID/Reference #: [id number]

## 2024-07-17 ENCOUNTER — Encounter: Payer: Self-pay | Admitting: Cardiology

## 2024-07-18 ENCOUNTER — Encounter: Admitting: Family Medicine

## 2024-07-18 ENCOUNTER — Other Ambulatory Visit (HOSPITAL_COMMUNITY): Payer: Self-pay

## 2024-07-18 ENCOUNTER — Other Ambulatory Visit: Payer: Self-pay

## 2024-07-21 ENCOUNTER — Other Ambulatory Visit (HOSPITAL_COMMUNITY): Payer: Self-pay

## 2024-07-21 ENCOUNTER — Other Ambulatory Visit (HOSPITAL_COMMUNITY)
Admission: RE | Admit: 2024-07-21 | Discharge: 2024-07-21 | Disposition: A | Discharge status: Home / Self Care | Source: Ambulatory Visit | Attending: Family Medicine | Admitting: Family Medicine

## 2024-07-21 ENCOUNTER — Encounter (HOSPITAL_COMMUNITY): Payer: Self-pay

## 2024-07-21 ENCOUNTER — Ambulatory Visit (INDEPENDENT_AMBULATORY_CARE_PROVIDER_SITE_OTHER): Payer: Self-pay | Admitting: Family Medicine

## 2024-07-21 ENCOUNTER — Encounter: Payer: Self-pay | Admitting: Family Medicine

## 2024-07-21 VITALS — BP 145/78 | HR 100 | Ht 67.0 in | Wt 240.0 lb

## 2024-07-21 DIAGNOSIS — Z113 Encounter for screening for infections with a predominantly sexual mode of transmission: Secondary | ICD-10-CM | POA: Insufficient documentation

## 2024-07-21 DIAGNOSIS — Z0000 Encounter for general adult medical examination without abnormal findings: Secondary | ICD-10-CM

## 2024-07-21 DIAGNOSIS — Z23 Encounter for immunization: Secondary | ICD-10-CM

## 2024-07-21 DIAGNOSIS — Z87891 Personal history of nicotine dependence: Secondary | ICD-10-CM

## 2024-07-21 DIAGNOSIS — E785 Hyperlipidemia, unspecified: Secondary | ICD-10-CM

## 2024-07-21 DIAGNOSIS — E1169 Type 2 diabetes mellitus with other specified complication: Secondary | ICD-10-CM

## 2024-07-21 LAB — POCT GLYCOSYLATED HEMOGLOBIN (HGB A1C): HbA1c, POC (controlled diabetic range): 11.8 % — AB (ref 0.0–7.0)

## 2024-07-21 MED ORDER — REPATHA SURECLICK 140 MG/ML ~~LOC~~ SOAJ
140.0000 mg | SUBCUTANEOUS | 4 refills | Status: DC
  Filled 2024-07-21: qty 2, 28d supply, fill #0

## 2024-07-21 MED ORDER — OZEMPIC (0.25 OR 0.5 MG/DOSE) 2 MG/3ML ~~LOC~~ SOPN
0.5000 mg | PEN_INJECTOR | SUBCUTANEOUS | 1 refills | Status: DC
  Filled 2024-07-21: qty 3, 28d supply, fill #0
  Filled 2024-09-23: qty 3, 28d supply, fill #1

## 2024-07-21 NOTE — Assessment & Plan Note (Signed)
 A1C checked today and increased to 11 due to poor medication adherence Will need to start Ozempic  at a lower dose of 0.5 mg Will reach out to the pharmacy tech to help with prior Auth

## 2024-07-21 NOTE — Patient Instructions (Signed)
 Preventing Sexually Transmitted Infections, Adult Sexually transmitted infections (STIs) are spread from person to person (are contagious). They are spread, or transmitted, during sex. The sex may be vaginal, anal, or oral. STIs can be passed during sexual contact with skin, genitals, mouth, or rectum. They may spread through body fluids, such as saliva, semen, blood, vaginal mucus, and urine. STIs are very common. They can happen in people of all ages. Some common STIs are: Herpes. Hepatitis B. Chlamydia. Gonorrhea. Syphilis. Trichomoniasis. Human papillomavirus (HPV). Human immunodeficiency virus (HIV). This can cause acquired immunodeficiency syndrome (AIDS). How can STIs affect me? You may not have symptoms with an STI. Even if you do not have symptoms, you can still spread the infection to others. You also still need treatment. STIs can be treated. Some STIs can be cured. Other STIs cannot be cured and will affect you for the rest of your life. Certain STIs may: Require you to take medicine for the rest of your life. Affect your ability to have children. Increase your risk for getting other STIs. Increase your risk of getting certain conditions. These may include: Cervical cancer. Pelvic inflammatory disease (PID). Organ damage or damage to other parts of your body. This can happen if the infection spreads. Cause problems during pregnancy. STIs may be spread to the baby during pregnancy or birth. Females tend to have more severe problems from STIs than males. What can increase my risk? You may be more at risk for an STI if: You do not use protection during sex. You have more than one sex partner. You have a sex partner who has other sex partners. You have sex with a person who has an STI. You have an STI, or you have had an STI before. You inject drugs or have a sex partner who injects drugs. What actions can I take to prevent STIs? The only way to fully prevent STIs is not to  have sex of any kind. This is called practicing abstinence. If you are sexually active, you can protect yourself and others by taking these actions to lower your risk of getting an STI: Lifestyle Have only one sex partner or limit the number of sex partners you have. Avoid having sex after you have alcohol or drugs. Alcohol and drugs can affect your ability to make good choices. This can lead to risky sexual behaviors. Go to prevention counseling. This can teach you how to avoid getting an STI. Barrier protection  Use methods to stop body fluids from being exchanged between partners during sex (barrier protection). These methods can be used during oral, vaginal, or anal sex. They include: External condom, for males. Internal condom, for females. Dental dam. Use a new barrier method for every sex act from start to finish. Know that a barrier method may not protect you from all STIs. Some STIs, such as herpes, are spread through skin-to-skin contact. Avoid all sexual contact if you or a partner has herpes and there is an active flare with open sores. Birth control pills, injections, implants, and intrauterine devices (IUDs) do not protect against STIs. To prevent both STIs and pregnancy, always use a condom with a second form of birth control. General information Ask your health care provider about taking pre-exposure prophylaxis (PrEP) to prevent HIV. Stay up to date on your vaccines. Some vaccines can lower your risk of getting certain STIs. These include: Hepatitis B vaccine. HPV vaccine. This is recommended for people up to age 66. Get tested for STIs. Have your  partners get tested, too. If you test positive for an STI, follow recommendations from your health care provider about treatment. Make sure your sex partners are tested and treated as well. Where to find more information Learn more about STIs from: Centers for Disease Control and Prevention (CDC): More information about certain  STIs: TonerPromos.no Places to get sexual health counseling and treatment for free or at a low cost: gettested.TonerPromos.no U.S. Department of Health and Human Services [facility]): TravelLesson.ca This information is not intended to replace advice given to you by your health care provider. Make sure you discuss any questions you have with your health care provider. Document Revised: 11/09/2022 Document Reviewed: 04/14/2022 Elsevier Patient Education  2024 [employer].

## 2024-07-21 NOTE — Progress Notes (Signed)
 SUBJECTIVE:   Chief compliant/HPI: annual examination  Tanya Nolan is a 48 y.o. who presents today for an annual exam.  She is inconsistent with her DM and BP medication intake. Last dose of her BP meds was about 2 days ago. She has been off Ozempic  for more than 1-2 months.  C/O intermittent headache x 10 days, and she uses BC powder. Currently asymptomatic. She is smoking more due to stress now that her school is coming to an end.  Review of systems form notable for: See above.   Updated history tabs and problem list PMHx reviewed.   OBJECTIVE:   BP (!) 145/78   Pulse 100   Ht 5' 7 (1.702 m)   Wt 240 lb (108.9 kg)   LMP 07/13/2024   SpO2 100%   BMI 37.59 kg/m   Physical Exam Vitals and nursing note reviewed. Exam conducted with a chaperone present Ceasar Leggette).  Constitutional:      Appearance: She is not ill-appearing.  HENT:     Head: Normocephalic and atraumatic.     Right Ear: Tympanic membrane and ear canal normal. There is no impacted cerumen.     Left Ear: Tympanic membrane and ear canal normal. There is no impacted cerumen.     Nose: No congestion.     Mouth/Throat:     Mouth: Mucous membranes are moist.     Pharynx: No oropharyngeal exudate or posterior oropharyngeal erythema.  Eyes:     General:        Right eye: No discharge.        Left eye: No discharge.     Extraocular Movements: Extraocular movements intact.     Conjunctiva/sclera: Conjunctivae normal.  Cardiovascular:     Rate and Rhythm: Normal rate and regular rhythm.     Heart sounds: Normal heart sounds. No murmur heard. Pulmonary:     Effort: Pulmonary effort is normal. No respiratory distress.     Breath sounds: Normal breath sounds. No wheezing.  Abdominal:     General: Abdomen is flat. There is no distension.     Palpations: Abdomen is soft. There is no mass.     Tenderness: There is no abdominal tenderness.  Genitourinary:    Vagina: Normal.     Cervix: Normal.   Musculoskeletal:     Cervical back: Neck supple.     Right lower leg: No edema.     Left lower leg: No edema.     Comments: Sensory exam of the foot is normal, tested with the monofilament. Good pulses, no lesions or ulcers, good peripheral pulses.   Neurological:     General: No focal deficit present.     Mental Status: She is alert and oriented to person, place, and time.     Cranial Nerves: No cranial nerve deficit.     Sensory: No sensory deficit.     Motor: No weakness.  Psychiatric:        Mood and Affect: Mood normal.      ASSESSMENT/PLAN:  SEX FEW DAYS AGO< NO PROTECT. Same partner Assessment & Plan Well adult exam  Type 2 diabetes mellitus with hyperlipidemia (HCC) A1C checked today and increased to 11 due to poor medication adherence Will need to start Ozempic  at a lower dose of 0.5 mg Will reach out to the pharmacy tech to help with prior Auth Screening for STD (sexually transmitted disease) GC/Chlam ordered and sample sent to lab Smoking history Counseling provided Interested in connecting  with Dr. Koval virtually for smoking cessation Will place referral  Headache: Currently asymptomatic No neurologic deficit Use Tylenol  as needed F/U soon if no improvement Annual Examination  See AVS for age appropriate recommendations.   PHQ score 7, reviewed and discussed. Blood pressure reviewed and at goal :No.  Asked about intimate partner violence and patient reports Negative.  The patient currently uses nothing for contraception. Does not want contraceptives  Folate recommended as appropriate, minimum of 400 mcg per day.  Advanced directives Discussed   Considered the following items based upon USPSTF recommendations: HIV testing: discussed Hepatitis C: discussed Hepatitis B: discussed Syphilis if at high risk: discussed GC/CT Requested testing. Has recent unprotected sex with same partner of many years Lipid panel (nonfasting or fasting) discussed based  upon AHA recommendations and not ordered.  Consider repeat every 4-6 years.  Reviewed risk factors for latent tuberculosis and not indicated  Discussed family history, BRCA testing not indicated. Tool used to risk stratify was Pedigree Assessment tool N/A  Cervical cancer screening: Reviewed Immunizations HPV and flu shots today  MyChart Activation:Already signed up   Follow up in 1   year or sooner if indicated.    Otto Fairly, MD [facility]

## 2024-07-22 ENCOUNTER — Ambulatory Visit: Payer: Self-pay | Admitting: Family Medicine

## 2024-07-22 LAB — CERVICOVAGINAL ANCILLARY ONLY
Chlamydia: NEGATIVE
Comment: NEGATIVE
Comment: NEGATIVE
Comment: NORMAL
Neisseria Gonorrhea: NEGATIVE
Trichomonas: NEGATIVE

## 2024-07-23 ENCOUNTER — Other Ambulatory Visit (HOSPITAL_COMMUNITY): Payer: Self-pay

## 2024-07-24 ENCOUNTER — Telehealth: Payer: Self-pay | Admitting: Pharmacist

## 2024-07-24 ENCOUNTER — Other Ambulatory Visit (HOSPITAL_COMMUNITY): Payer: Self-pay

## 2024-07-24 NOTE — Telephone Encounter (Signed)
 Patient contacted for follow-up of request for tobacco cessation assistance.    Clarified access to Ozempic  (semaglutide ) as well.   Since last contact patient reports smoking ~ 1 ppd of cigarettes.   Scheduled for clinic visit to discuss tobacco cessation on 10/2 Total time with patient call and documentation of interaction: 9 minutes.

## 2024-07-24 NOTE — Telephone Encounter (Signed)
-----   Message from Tanya Nolan sent at 07/21/2024  2:36 PM EDT ----- Hello Dr. Keny Donald,  I referred patient to you for smoking cessation. Please help schedule virtual appointment.  Camille, I had to send in Ozempic  0.5 mg since patient has been off meds for almost 4 months she says. Can you help with prior auth? Thanks  [employer]

## 2024-07-25 NOTE — Telephone Encounter (Signed)
 Reviewed and agree with Dr Rennis plan.

## 2024-08-01 ENCOUNTER — Other Ambulatory Visit: Payer: Self-pay | Admitting: Family Medicine

## 2024-08-01 ENCOUNTER — Telehealth: Payer: Self-pay

## 2024-08-01 ENCOUNTER — Other Ambulatory Visit (HOSPITAL_COMMUNITY): Payer: Self-pay

## 2024-08-01 MED ORDER — BENZONATATE 100 MG PO CAPS
100.0000 mg | ORAL_CAPSULE | Freq: Two times a day (BID) | ORAL | 0 refills | Status: AC | PRN
  Filled 2024-08-01: qty 14, 7d supply, fill #0

## 2024-08-01 NOTE — Telephone Encounter (Signed)
 Patient reports that she is having a bronchitis flare up- coughing up phlegm.   She reports that she has been having productive cough, with thick white mucus. Feels that cough has worsened over the last week.   Worse especially at night and first thing in the morning.   Denies fever, chills or body aches.  Advised that we would like for her to be seen in clinic for further evaluation. Scheduled for Monday afternoon in [place].   Patient is asking for rx of Tessalon  perles for over the weekend.   Advised that I would send message to PCP.   Chiquita JAYSON English, RN

## 2024-08-01 NOTE — Telephone Encounter (Signed)
 Done

## 2024-08-04 ENCOUNTER — Ambulatory Visit: Payer: Self-pay | Admitting: Family Medicine

## 2024-08-04 ENCOUNTER — Ambulatory Visit (INDEPENDENT_AMBULATORY_CARE_PROVIDER_SITE_OTHER): Payer: Self-pay | Admitting: Family Medicine

## 2024-08-04 ENCOUNTER — Other Ambulatory Visit (HOSPITAL_COMMUNITY): Payer: Self-pay

## 2024-08-04 VITALS — BP 156/89 | HR 78 | Temp 97.9°F | Ht 67.0 in | Wt 237.6 lb

## 2024-08-04 DIAGNOSIS — R059 Cough, unspecified: Secondary | ICD-10-CM

## 2024-08-04 DIAGNOSIS — J208 Acute bronchitis due to other specified organisms: Secondary | ICD-10-CM

## 2024-08-04 DIAGNOSIS — E1159 Type 2 diabetes mellitus with other circulatory complications: Secondary | ICD-10-CM

## 2024-08-04 DIAGNOSIS — B9689 Other specified bacterial agents as the cause of diseases classified elsewhere: Secondary | ICD-10-CM

## 2024-08-04 DIAGNOSIS — I152 Hypertension secondary to endocrine disorders: Secondary | ICD-10-CM

## 2024-08-04 LAB — POC SOFIA 2 FLU + SARS ANTIGEN FIA
Influenza A, POC: NEGATIVE
Influenza B, POC: NEGATIVE
SARS Coronavirus 2 Ag: NEGATIVE

## 2024-08-04 MED ORDER — ALBUTEROL SULFATE HFA 108 (90 BASE) MCG/ACT IN AERS
1.0000 | INHALATION_SPRAY | Freq: Four times a day (QID) | RESPIRATORY_TRACT | 1 refills | Status: AC | PRN
  Filled 2024-08-04: qty 6.7, 25d supply, fill #0

## 2024-08-04 MED ORDER — AZITHROMYCIN 250 MG PO TABS
ORAL_TABLET | ORAL | 0 refills | Status: AC
  Filled 2024-08-04: qty 6, 5d supply, fill #0

## 2024-08-04 NOTE — Progress Notes (Signed)
    SUBJECTIVE:   CHIEF COMPLAINT / HPI:   Severe cough x 2 weeks Patient is concerned about bronchitis flareup. Has a history of this for which she has previously responded to azithromycin  therapy Has been having productive cough with thick white mucus worsening over the past week.  Worse at night and first thing in the morning. Denies any fevers, chills, body aches. Rx for Tessalon  Perles was sent on Friday. She has previously been prescribed an as needed albuterol  inhaler however this was reportedly never sent to her pharmacy  Did not take BP meds today  Denies significant HA, vision changes, CP/SOB, abd pain, N/V/D  PERTINENT  PMH / PSH: T2DM, HLD, tobacco use, CAD. HTN on diovan -HCT 80-12.5 and toprolXL 25mg    OBJECTIVE:   BP (!) 156/89   Pulse 78   Temp 97.9 F (36.6 C)   Ht 5' 7 (1.702 m)   Wt 237 lb 9.6 oz (107.8 kg)   LMP 07/13/2024   SpO2 99%   BMI 37.21 kg/m    General: NAD, pleasant, able to participate in exam Cardiac: RRR, no murmurs auscultated Respiratory: CTAB, normal WOB on RA, no wheezing or focal findings Abdomen: soft, non-tender, non-distended, normoactive bowel sounds Extremities: warm and well perfused, no edema or cyanosis Skin: warm and dry, no rashes noted Neuro: alert, no obvious focal deficits, speech normal Psych: Normal affect and mood  ASSESSMENT/PLAN:   Assessment & Plan Acute bacterial bronchitis Cough, unspecified type COVID/flu negative Afebrile, HDS, on room air, nonfocal exam Rx azithromycin  given good response to this in the past Tessalon  perles already sent CXR if no improvement, discussed return precautions Sent refill of albuterol  inhaler Hypertension associated with diabetes (HCC) Above goal today Did not take diovan -hct today Recommended taking med and checking bp at home over the next few days, recommend PCP f/u   Payton Coward, MD [facility]

## 2024-08-04 NOTE — Assessment & Plan Note (Signed)
 Above goal today Did not take diovan -hct today Recommended taking med and checking bp at home over the next few days, recommend PCP f/u

## 2024-08-04 NOTE — Patient Instructions (Addendum)
 Take azithromycin  as prescribed  If your symptoms do not improve, let me know and we can consider doing a chest x-ray  Please take your blood pressure medicine and keep track of your blood pressure at home over the next couple of weeks.  Please make an appointment to see your primary care doctor soon to discuss your blood pressure regimen  I will send your results of your COVID and flu testing on MyChart

## 2024-08-14 ENCOUNTER — Ambulatory Visit: Admitting: Pharmacist

## 2024-08-19 ENCOUNTER — Encounter: Payer: Self-pay | Admitting: Pharmacist

## 2024-08-19 DIAGNOSIS — E1165 Type 2 diabetes mellitus with hyperglycemia: Secondary | ICD-10-CM | POA: Insufficient documentation

## 2024-08-25 ENCOUNTER — Other Ambulatory Visit (HOSPITAL_COMMUNITY): Payer: Self-pay

## 2024-08-25 ENCOUNTER — Encounter: Payer: Self-pay | Admitting: Pharmacist

## 2024-08-25 ENCOUNTER — Telehealth: Payer: Self-pay

## 2024-08-25 DIAGNOSIS — I2510 Atherosclerotic heart disease of native coronary artery without angina pectoris: Secondary | ICD-10-CM

## 2024-08-25 DIAGNOSIS — E785 Hyperlipidemia, unspecified: Secondary | ICD-10-CM

## 2024-08-25 MED ORDER — REPATHA SURECLICK 140 MG/ML ~~LOC~~ SOAJ
140.0000 mg | SUBCUTANEOUS | 0 refills | Status: DC
  Filled 2024-08-25 – 2024-09-23 (×2): qty 2, 28d supply, fill #0

## 2024-08-25 NOTE — Addendum Note (Signed)
 Addended by: DARRELL BRUCKNER on: 08/25/2024 08:22 AM   Modules accepted: Orders

## 2024-08-25 NOTE — Telephone Encounter (Signed)
 Pharmacy reached out that Repatha  needs a prior authorization. Found denial on chart media from August stating plan needs labs within the last 120 days of renewal request.     Team can we order an updated Lipid panel for patient?

## 2024-08-25 NOTE — Telephone Encounter (Signed)
 Rx sent under Dr Anner

## 2024-08-26 ENCOUNTER — Other Ambulatory Visit: Payer: Self-pay | Admitting: *Deleted

## 2024-08-26 NOTE — Telephone Encounter (Signed)
 Called patient  unable to leave a message - phone states unable to connect this is a recording

## 2024-08-27 ENCOUNTER — Other Ambulatory Visit (HOSPITAL_COMMUNITY)
Admission: RE | Admit: 2024-08-27 | Discharge: 2024-08-27 | Disposition: A | Discharge status: Home / Self Care | Payer: Self-pay | Source: Ambulatory Visit | Attending: Medical Genetics | Admitting: Medical Genetics

## 2024-09-07 LAB — GENECONNECT MOLECULAR SCREEN: Genetic Analysis Overall Interpretation: NEGATIVE

## 2024-09-23 ENCOUNTER — Other Ambulatory Visit: Payer: Self-pay

## 2024-09-23 ENCOUNTER — Other Ambulatory Visit (HOSPITAL_COMMUNITY): Payer: Self-pay

## 2024-09-25 NOTE — Addendum Note (Signed)
 Addended by: GLADIS REENA GAILS on: 09/25/2024 11:45 AM   Modules accepted: Orders

## 2024-09-25 NOTE — Telephone Encounter (Signed)
 Called and left a message for patient to have a set a labs ( lipid)  . The lab is needed for prior authorization for Repatha .

## 2024-10-03 ENCOUNTER — Other Ambulatory Visit (HOSPITAL_COMMUNITY): Payer: Self-pay

## 2024-11-03 NOTE — Telephone Encounter (Signed)
 We appreciate you Reena!

## 2024-11-03 NOTE — Telephone Encounter (Signed)
 Called left a message on patient's voice mail  to go to the lab for lipid panel    The information is need to process Repatha  request  for prior authorization.  Left phone number to call if she has any questions

## 2024-11-27 ENCOUNTER — Telehealth: Payer: Self-pay | Admitting: Family Medicine

## 2024-11-27 DIAGNOSIS — R102 Pelvic and perineal pain unspecified side: Secondary | ICD-10-CM

## 2024-11-27 NOTE — Progress Notes (Signed)
" °  Because Ms. Claudene, I feel your condition warrants further evaluation and I recommend that you be seen in a face-to-face visit.   NOTE: There will be NO CHARGE for this E-Visit   If you are having a true medical emergency, please call 911.     For an urgent face to face visit, [facility] has multiple urgent care centers for your convenience.  Click the link below for the full list of locations and hours, walk-in wait times, appointment scheduling options and driving directions:  Urgent Care - [place], [place], [place], [place], [place], [place]  [place]     Your MyChart E-visit questionnaire answers were reviewed by a board certified advanced clinical practitioner to complete your personal care plan based on your specific symptoms.    Thank you for using e-Visits.    "

## 2024-12-17 ENCOUNTER — Encounter: Payer: Self-pay | Admitting: Family Medicine

## 2024-12-19 ENCOUNTER — Telehealth (HOSPITAL_COMMUNITY): Payer: Self-pay

## 2024-12-19 ENCOUNTER — Encounter: Payer: Self-pay | Admitting: Family Medicine

## 2024-12-19 ENCOUNTER — Other Ambulatory Visit (HOSPITAL_COMMUNITY): Payer: Self-pay

## 2024-12-19 ENCOUNTER — Ambulatory Visit: Admitting: Family Medicine

## 2024-12-19 ENCOUNTER — Telehealth (HOSPITAL_COMMUNITY): Payer: Self-pay | Admitting: Pharmacist

## 2024-12-19 VITALS — BP 151/89 | HR 60 | Ht 67.0 in | Wt 234.4 lb

## 2024-12-19 DIAGNOSIS — I2510 Atherosclerotic heart disease of native coronary artery without angina pectoris: Secondary | ICD-10-CM

## 2024-12-19 DIAGNOSIS — R059 Cough, unspecified: Secondary | ICD-10-CM

## 2024-12-19 DIAGNOSIS — I152 Hypertension secondary to endocrine disorders: Secondary | ICD-10-CM

## 2024-12-19 DIAGNOSIS — Z1231 Encounter for screening mammogram for malignant neoplasm of breast: Secondary | ICD-10-CM

## 2024-12-19 DIAGNOSIS — M545 Low back pain, unspecified: Secondary | ICD-10-CM

## 2024-12-19 DIAGNOSIS — E1169 Type 2 diabetes mellitus with other specified complication: Secondary | ICD-10-CM

## 2024-12-19 DIAGNOSIS — E039 Hypothyroidism, unspecified: Secondary | ICD-10-CM

## 2024-12-19 DIAGNOSIS — I252 Old myocardial infarction: Secondary | ICD-10-CM

## 2024-12-19 LAB — POCT GLYCOSYLATED HEMOGLOBIN (HGB A1C): Hemoglobin A1C: 12 % — AB (ref 4.0–5.6)

## 2024-12-19 MED ORDER — REPATHA SURECLICK 140 MG/ML ~~LOC~~ SOAJ
140.0000 mg | SUBCUTANEOUS | 1 refills | Status: AC
  Filled 2024-12-19: qty 6, 84d supply, fill #0

## 2024-12-19 MED ORDER — VALSARTAN-HYDROCHLOROTHIAZIDE 80-12.5 MG PO TABS
1.0000 | ORAL_TABLET | Freq: Every day | ORAL | 1 refills | Status: AC
  Filled 2024-12-19: qty 90, 90d supply, fill #0

## 2024-12-19 MED ORDER — EMPAGLIFLOZIN 10 MG PO TABS
10.0000 mg | ORAL_TABLET | Freq: Every day | ORAL | 1 refills | Status: AC
  Filled 2024-12-19: qty 90, 90d supply, fill #0

## 2024-12-19 MED ORDER — CETIRIZINE HCL 10 MG PO TABS
10.0000 mg | ORAL_TABLET | Freq: Every day | ORAL | 1 refills | Status: AC | PRN
  Filled 2024-12-19: qty 90, 90d supply, fill #0

## 2024-12-19 MED ORDER — EZETIMIBE 10 MG PO TABS
10.0000 mg | ORAL_TABLET | Freq: Every day | ORAL | 1 refills | Status: AC
  Filled 2024-12-19: qty 90, 90d supply, fill #0

## 2024-12-19 MED ORDER — CLOPIDOGREL BISULFATE 75 MG PO TABS
75.0000 mg | ORAL_TABLET | Freq: Every day | ORAL | 1 refills | Status: AC
  Filled 2024-12-19: qty 90, 90d supply, fill #0

## 2024-12-19 MED ORDER — ATORVASTATIN CALCIUM 80 MG PO TABS
80.0000 mg | ORAL_TABLET | Freq: Every day | ORAL | 1 refills | Status: AC
  Filled 2024-12-19: qty 90, 90d supply, fill #0

## 2024-12-19 MED ORDER — OZEMPIC (0.25 OR 0.5 MG/DOSE) 2 MG/3ML ~~LOC~~ SOPN
0.5000 mg | PEN_INJECTOR | SUBCUTANEOUS | 1 refills | Status: AC
  Filled 2024-12-19: qty 3, 28d supply, fill #0

## 2024-12-19 MED ORDER — LEVOTHYROXINE SODIUM 125 MCG PO TABS
125.0000 ug | ORAL_TABLET | Freq: Every day | ORAL | 1 refills | Status: AC
  Filled 2024-12-19: qty 90, 90d supply, fill #0

## 2024-12-19 MED ORDER — METOPROLOL SUCCINATE ER 25 MG PO TB24
25.0000 mg | ORAL_TABLET | Freq: Every day | ORAL | 1 refills | Status: AC
  Filled 2024-12-19: qty 90, 90d supply, fill #0

## 2024-12-19 MED ORDER — CYCLOBENZAPRINE HCL 5 MG PO TABS
5.0000 mg | ORAL_TABLET | Freq: Two times a day (BID) | ORAL | 0 refills | Status: AC | PRN
  Filled 2024-12-19: qty 30, 15d supply, fill #0

## 2024-12-19 MED ORDER — SERTRALINE HCL 100 MG PO TABS
100.0000 mg | ORAL_TABLET | Freq: Every day | ORAL | 3 refills | Status: AC
  Filled 2024-12-19: qty 30, 30d supply, fill #0

## 2024-12-19 NOTE — Patient Instructions (Signed)
 Back Exercises These exercises help to make your trunk and back strong. They also help to keep the lower back flexible. Doing these exercises can help to prevent or lessen pain in your lower back. If you have back pain, try to do these exercises 2-3 times each day or as told by your doctor. As you get better, do the exercises once each day. Repeat the exercises more often as told by your doctor. To stop back pain from coming back, do the exercises once each day, or as told by your doctor. Do exercises exactly as told by your doctor. Stop right away if you feel sudden pain or your pain gets worse. Exercises Single knee to chest Do these steps 3-5 times in a row for each leg: Lie on your back on a firm bed or the floor with your legs stretched out. Bring one knee to your chest. Grab your knee or thigh with both hands and hold it in place. Pull on your knee until you feel a gentle stretch in your lower back or butt. Keep doing the stretch for 10-30 seconds. Slowly let go of your leg and straighten it. Pelvic tilt Do these steps 5-10 times in a row: Lie on your back on a firm bed or the floor with your legs stretched out. Bend your knees so they point up to the ceiling. Your feet should be flat on the floor. Tighten your lower belly (abdomen) muscles to press your lower back against the floor. This will make your tailbone point up to the ceiling instead of pointing down to your feet or the floor. Stay in this position for 5-10 seconds while you gently tighten your muscles and breathe evenly. Cat-cow Do these steps until your lower back bends more easily: Get on your hands and knees on a firm bed or the floor. Keep your hands under your shoulders, and keep your knees under your hips. You may put padding under your knees. Let your head hang down toward your chest. Tighten (contract) the muscles in your belly. Point your tailbone toward the floor so your lower back becomes rounded like the back of a  cat. Stay in this position for 5 seconds. Slowly lift your head. Let the muscles of your belly relax. Point your tailbone up toward the ceiling so your back forms a sagging arch like the back of a cow. Stay in this position for 5 seconds.  Press-ups Do these steps 5-10 times in a row: Lie on your belly (face-down) on a firm bed or the floor. Place your hands near your head, about shoulder-width apart. While you keep your back relaxed and keep your hips on the floor, slowly straighten your arms to raise the top half of your body and lift your shoulders. Do not use your back muscles. You may change where you place your hands to make yourself more comfortable. Stay in this position for 5 seconds. Keep your back relaxed. Slowly return to lying flat on the floor.  Bridges Do these steps 10 times in a row: Lie on your back on a firm bed or the floor. Bend your knees so they point up to the ceiling. Your feet should be flat on the floor. Your arms should be flat at your sides, next to your body. Tighten your butt muscles and lift your butt off the floor until your waist is almost as high as your knees. If you do not feel the muscles working in your butt and the back of  your thighs, slide your feet 1-2 inches (2.5-5 cm) farther away from your butt. Stay in this position for 3-5 seconds. Slowly lower your butt to the floor, and let your butt muscles relax. If this exercise is too easy, try doing it with your arms crossed over your chest. Belly crunches Do these steps 5-10 times in a row: Lie on your back on a firm bed or the floor with your legs stretched out. Bend your knees so they point up to the ceiling. Your feet should be flat on the floor. Cross your arms over your chest. Tip your chin a little bit toward your chest, but do not bend your neck. Tighten your belly muscles and slowly raise your chest just enough to lift your shoulder blades a tiny bit off the floor. Avoid raising your body  higher than that because it can put too much stress on your lower back. Slowly lower your chest and your head to the floor. Back lifts Do these steps 5-10 times in a row: Lie on your belly (face-down) with your arms at your sides, and rest your forehead on the floor. Tighten the muscles in your legs and your butt. Slowly lift your chest off the floor while you keep your hips on the floor. Keep the back of your head in line with the curve in your back. Look at the floor while you do this. Stay in this position for 3-5 seconds. Slowly lower your chest and your face to the floor. Contact a doctor if: Your back pain gets a lot worse when you do an exercise. Your back pain does not get better within 2 hours after you exercise. If you have any of these problems, stop doing the exercises. Do not do them again unless your doctor says it is okay. Get help right away if: You have sudden, very bad back pain. If this happens, stop doing the exercises. Do not do them again unless your doctor says it is okay. This information is not intended to replace advice given to you by your health care provider. Make sure you discuss any questions you have with your health care provider. Document Revised: 01/12/2021 Document Reviewed: 01/12/2021 Elsevier Patient Education  2024 [employer].

## 2024-12-19 NOTE — Assessment & Plan Note (Addendum)
 Non-adherence to Synthroid  for 2-3 months. Plan to assess thyroid  function. - Ordered thyroid  function tests. - Will reassess Synthroid  dosage based on test results.

## 2024-12-19 NOTE — Assessment & Plan Note (Addendum)
 A1c at 12, indicating poor control. Inconsistent use of Jardiance  and Ozempic . Emphasized glucose control and cholesterol management. - Restart Ozempic  at 0.5 mg for one month, then increase to 1 mg. - Continue Jardiance  10 mg daily. - Ordered cholesterol test. - Prescribed Repatha  pending cholesterol test results. - Emphasized importance of regular medication adherence.

## 2024-12-19 NOTE — Assessment & Plan Note (Addendum)
 Blood pressure 151/89. Inconsistent antihypertensive use. Discussed importance of adherence to prevent cardiovascular events. - Continue metoprolol  25 mg extended release daily. - Continue Diovan  HCTZ 80/12.5 mg daily. - Scheduled follow-up in two weeks to reassess blood pressure

## 2024-12-19 NOTE — Assessment & Plan Note (Addendum)
 Med adherence discussed

## 2024-12-19 NOTE — Progress Notes (Signed)
 "   SUBJECTIVE:   CHIEF COMPLAINT / HPI:   Discussed the use of AI scribe software for clinical note transcription with the patient, who gave verbal consent to proceed.  History of Present Illness   Tanya Nolan is a 49 year old female with hypertension and coronary artery disease who presents with left lower back pain after a fall.  BACK PAIN She has been experiencing left lower back pain following a fall on the ice approximately ten days ago. The pain is sharp, radiates to the front, and is exacerbated by certain movements, such as laying on the affected side or turning the wrong way. She rates the pain as a five or six out of ten in severity. Tylenol  provides some relief.  HTN/DM2/HLD/CAD/HYPOTHYROIDISM She is currently taking baby aspirin , Lipitor  80 mg daily, Plavix  75 mg QD for CAD For Diabetes she is on Jardiance  10 mg daily, and Ozempic  0.5 mg weekly, last dose more than 5 months ago BP meds include metoprolol  25 mg extended release once a day, and Diovan  80/12.5 mg daily. Lipitor  80 mg QD for HLD, Zetia  10 mg QD and Repatha  14 mg Q2 weeks. She has been non-adherent with all medications for weeks and resumed them this Monday. She has not been on Repatha  for months due to insurance coverage. She was informed she needed repeat blood work. She was previously in school and is now working more, which has impacted her ability to pick up medications.   COUGH She reports a persistent cough that is worse in the mornings and sometimes when lying down. No fever is noted, but the cough is frequent.       PERTINENT  PMH / PSH: PMHx reviewed  OBJECTIVE:   BP (!) 144/73   Pulse 60   Ht 5' 7 (1.702 m)   Wt 234 lb 6 oz (106.3 kg)   LMP 12/15/2024   SpO2 99%   BMI 36.71 kg/m   Physical Exam   VITALS: BP- 151/89 CHEST: Lungs clear to auscultation, no wheezing. CARDIOVASCULAR: Heart sounds normal, no murmurs. MUSCULOSKELETAL: No tenderness or deformity in right lower back. Mild to  moderate tenderness in left flank area. No thoracic or cervical spine tenderness. NEUROLOGICAL: No sensory loss. Good strength. SKIN: No bruising.     Results   Labs A1c (12/19/2024): A1c 12 increased from 11.8 on 07/2024       ASSESSMENT/PLAN:   Assessment & Plan Type 2 diabetes mellitus with hyperlipidemia (HCC) A1c at 12, indicating poor control. Inconsistent use of Jardiance  and Ozempic . Emphasized glucose control and cholesterol management. - Restart Ozempic  at 0.5 mg for one month, then increase to 1 mg. - Continue Jardiance  10 mg daily. - Ordered cholesterol test. - Prescribed Repatha  pending cholesterol test results. - Emphasized importance of regular medication adherence. Hypothyroidism, unspecified type Non-adherence to Synthroid  for 2-3 months. Plan to assess thyroid  function. - Ordered thyroid  function tests. - Will reassess Synthroid  dosage based on test results. Cough Benign pulm exam Zyrtec  escribed. May be allergy related. Hyperlipidemia associated with type 2 diabetes mellitus (HCC) FLP today Refilled meds Adherence discussed.  Hypertension associated with diabetes (HCC) Blood pressure 151/89. Inconsistent antihypertensive use. Discussed importance of adherence to prevent cardiovascular events. - Continue metoprolol  25 mg extended release daily. - Continue Diovan  HCTZ 80/12.5 mg daily. - Scheduled follow-up in two weeks to reassess blood pressure Coronary artery disease, unspecified vessel or lesion type, unspecified whether angina present, unspecified whether native or transplanted heart Med adherence discussed Low back pain,  unspecified back pain laterality, unspecified chronicity, unspecified whether sciatica present Left lower back pain due to muscle strain Pain persists post-fall, likely muscle strain. No bruising. Pain rated 5-6/10, worsens with movement. - Prescribed pain medication and muscle relaxant. - Provided home exercise instructions. -  Advised warm massage, ice pack, or cold patch for pain relief. - Scheduled follow-up in two weeks to reassess pain and blood pressure.   General health maintenance Due for mammogram. HPV vaccine discussed but unavailable today. - Ordered mammogram. - Will discuss HPV vaccine at next visit.  Otto Fairly, MD [facility]  "

## 2024-12-19 NOTE — Assessment & Plan Note (Addendum)
 FLP today Refilled meds Adherence discussed.

## 2024-12-26 ENCOUNTER — Ambulatory Visit

## 2025-01-02 ENCOUNTER — Ambulatory Visit: Admitting: Family Medicine
